# Patient Record
Sex: Male | Born: 1965 | Race: White | Hispanic: No | Marital: Married | Smoking: Never smoker
Health system: Southern US, Community
[De-identification: ages and names within clinical notes are randomized; demographics above are authoritative.]

## PROBLEM LIST (undated history)

## (undated) DIAGNOSIS — K219 Gastro-esophageal reflux disease without esophagitis: Secondary | ICD-10-CM

## (undated) DIAGNOSIS — I10 Essential (primary) hypertension: Secondary | ICD-10-CM

## (undated) DIAGNOSIS — R569 Unspecified convulsions: Secondary | ICD-10-CM

## (undated) DIAGNOSIS — F419 Anxiety disorder, unspecified: Secondary | ICD-10-CM

## (undated) DIAGNOSIS — F445 Conversion disorder with seizures or convulsions: Secondary | ICD-10-CM

## (undated) HISTORY — PX: HERNIA REPAIR: SHX51

## (undated) HISTORY — PX: NASAL SEPTUM SURGERY: SHX37

## (undated) HISTORY — PX: NASAL SINUS SURGERY: SHX719

## (undated) HISTORY — DX: Essential (primary) hypertension: I10

## (undated) HISTORY — PX: MEDIASTINOSCOPY: SUR861

## (undated) HISTORY — PX: BRONCHOSCOPY: SUR163

## (undated) HISTORY — PX: CLEFT LIP REPAIR: SUR1164

## (undated) HISTORY — PX: INGUINAL HERNIA REPAIR: SUR1180

## (undated) HISTORY — PX: PLANTAR FASCIA SURGERY: SHX746

## (undated) HISTORY — PX: COLONOSCOPY: SHX174

---

## 1997-12-09 ENCOUNTER — Encounter: Admission: RE | Admit: 1997-12-09 | Discharge: 1998-03-09 | Payer: Self-pay | Admitting: *Deleted

## 1997-12-30 ENCOUNTER — Encounter: Payer: Self-pay | Admitting: Family Medicine

## 1997-12-30 ENCOUNTER — Ambulatory Visit (HOSPITAL_COMMUNITY): Admission: RE | Admit: 1997-12-30 | Discharge: 1997-12-30 | Payer: Self-pay | Admitting: Family Medicine

## 2013-04-02 DIAGNOSIS — R0600 Dyspnea, unspecified: Secondary | ICD-10-CM | POA: Insufficient documentation

## 2015-07-29 DIAGNOSIS — H6993 Unspecified Eustachian tube disorder, bilateral: Secondary | ICD-10-CM | POA: Insufficient documentation

## 2015-07-29 DIAGNOSIS — Z9622 Myringotomy tube(s) status: Secondary | ICD-10-CM | POA: Insufficient documentation

## 2015-07-29 DIAGNOSIS — H6983 Other specified disorders of Eustachian tube, bilateral: Secondary | ICD-10-CM | POA: Insufficient documentation

## 2016-03-08 DIAGNOSIS — J0101 Acute recurrent maxillary sinusitis: Secondary | ICD-10-CM | POA: Diagnosis not present

## 2016-03-09 DIAGNOSIS — R05 Cough: Secondary | ICD-10-CM | POA: Diagnosis not present

## 2016-03-09 DIAGNOSIS — J209 Acute bronchitis, unspecified: Secondary | ICD-10-CM | POA: Diagnosis not present

## 2016-03-09 DIAGNOSIS — J0101 Acute recurrent maxillary sinusitis: Secondary | ICD-10-CM | POA: Diagnosis not present

## 2016-06-01 DIAGNOSIS — F5101 Primary insomnia: Secondary | ICD-10-CM | POA: Diagnosis not present

## 2016-07-22 DIAGNOSIS — H9192 Unspecified hearing loss, left ear: Secondary | ICD-10-CM | POA: Diagnosis not present

## 2016-07-22 DIAGNOSIS — H9042 Sensorineural hearing loss, unilateral, left ear, with unrestricted hearing on the contralateral side: Secondary | ICD-10-CM | POA: Insufficient documentation

## 2016-07-22 DIAGNOSIS — H6983 Other specified disorders of Eustachian tube, bilateral: Secondary | ICD-10-CM | POA: Diagnosis not present

## 2016-07-22 DIAGNOSIS — Z9622 Myringotomy tube(s) status: Secondary | ICD-10-CM | POA: Diagnosis not present

## 2016-11-19 DIAGNOSIS — B372 Candidiasis of skin and nail: Secondary | ICD-10-CM | POA: Diagnosis not present

## 2017-04-06 DIAGNOSIS — H66002 Acute suppurative otitis media without spontaneous rupture of ear drum, left ear: Secondary | ICD-10-CM | POA: Diagnosis not present

## 2017-04-06 DIAGNOSIS — J0110 Acute frontal sinusitis, unspecified: Secondary | ICD-10-CM | POA: Diagnosis not present

## 2017-05-13 DIAGNOSIS — M546 Pain in thoracic spine: Secondary | ICD-10-CM | POA: Diagnosis not present

## 2017-05-13 DIAGNOSIS — S338XXA Sprain of other parts of lumbar spine and pelvis, initial encounter: Secondary | ICD-10-CM | POA: Diagnosis not present

## 2017-05-17 DIAGNOSIS — S338XXA Sprain of other parts of lumbar spine and pelvis, initial encounter: Secondary | ICD-10-CM | POA: Diagnosis not present

## 2017-05-17 DIAGNOSIS — M546 Pain in thoracic spine: Secondary | ICD-10-CM | POA: Diagnosis not present

## 2017-05-18 DIAGNOSIS — S338XXA Sprain of other parts of lumbar spine and pelvis, initial encounter: Secondary | ICD-10-CM | POA: Diagnosis not present

## 2017-05-18 DIAGNOSIS — M546 Pain in thoracic spine: Secondary | ICD-10-CM | POA: Diagnosis not present

## 2017-05-23 DIAGNOSIS — M546 Pain in thoracic spine: Secondary | ICD-10-CM | POA: Diagnosis not present

## 2017-05-23 DIAGNOSIS — S338XXA Sprain of other parts of lumbar spine and pelvis, initial encounter: Secondary | ICD-10-CM | POA: Diagnosis not present

## 2017-05-30 DIAGNOSIS — S338XXA Sprain of other parts of lumbar spine and pelvis, initial encounter: Secondary | ICD-10-CM | POA: Diagnosis not present

## 2017-05-30 DIAGNOSIS — M546 Pain in thoracic spine: Secondary | ICD-10-CM | POA: Diagnosis not present

## 2017-06-14 DIAGNOSIS — Z6832 Body mass index (BMI) 32.0-32.9, adult: Secondary | ICD-10-CM | POA: Diagnosis not present

## 2017-06-14 DIAGNOSIS — F5101 Primary insomnia: Secondary | ICD-10-CM | POA: Diagnosis not present

## 2017-08-22 DIAGNOSIS — J019 Acute sinusitis, unspecified: Secondary | ICD-10-CM | POA: Diagnosis not present

## 2017-08-22 DIAGNOSIS — H608X1 Other otitis externa, right ear: Secondary | ICD-10-CM | POA: Diagnosis not present

## 2017-08-22 DIAGNOSIS — H6691 Otitis media, unspecified, right ear: Secondary | ICD-10-CM | POA: Diagnosis not present

## 2017-09-07 DIAGNOSIS — H6983 Other specified disorders of Eustachian tube, bilateral: Secondary | ICD-10-CM | POA: Diagnosis not present

## 2017-09-07 DIAGNOSIS — H66002 Acute suppurative otitis media without spontaneous rupture of ear drum, left ear: Secondary | ICD-10-CM | POA: Insufficient documentation

## 2017-12-15 DIAGNOSIS — Z0000 Encounter for general adult medical examination without abnormal findings: Secondary | ICD-10-CM | POA: Diagnosis not present

## 2017-12-22 DIAGNOSIS — Z0001 Encounter for general adult medical examination with abnormal findings: Secondary | ICD-10-CM | POA: Diagnosis not present

## 2017-12-22 DIAGNOSIS — Z6831 Body mass index (BMI) 31.0-31.9, adult: Secondary | ICD-10-CM | POA: Diagnosis not present

## 2018-01-18 DIAGNOSIS — Z1211 Encounter for screening for malignant neoplasm of colon: Secondary | ICD-10-CM | POA: Insufficient documentation

## 2018-01-23 DIAGNOSIS — Z6830 Body mass index (BMI) 30.0-30.9, adult: Secondary | ICD-10-CM | POA: Diagnosis not present

## 2018-01-23 DIAGNOSIS — J019 Acute sinusitis, unspecified: Secondary | ICD-10-CM | POA: Diagnosis not present

## 2018-02-14 ENCOUNTER — Encounter (HOSPITAL_COMMUNITY): Payer: Self-pay

## 2018-02-14 ENCOUNTER — Other Ambulatory Visit: Payer: Self-pay

## 2018-02-14 ENCOUNTER — Emergency Department (HOSPITAL_COMMUNITY)
Admission: EM | Admit: 2018-02-14 | Discharge: 2018-02-14 | Disposition: A | Discharge status: Home / Self Care | Payer: 59 | Attending: Emergency Medicine | Admitting: Emergency Medicine

## 2018-02-14 ENCOUNTER — Emergency Department (HOSPITAL_COMMUNITY): Payer: 59

## 2018-02-14 DIAGNOSIS — Z79899 Other long term (current) drug therapy: Secondary | ICD-10-CM | POA: Insufficient documentation

## 2018-02-14 DIAGNOSIS — R079 Chest pain, unspecified: Secondary | ICD-10-CM | POA: Diagnosis not present

## 2018-02-14 DIAGNOSIS — R0789 Other chest pain: Secondary | ICD-10-CM | POA: Diagnosis not present

## 2018-02-14 DIAGNOSIS — K760 Fatty (change of) liver, not elsewhere classified: Secondary | ICD-10-CM | POA: Diagnosis not present

## 2018-02-14 LAB — BASIC METABOLIC PANEL
Anion gap: 11 (ref 5–15)
BUN: 20 mg/dL (ref 6–20)
CO2: 26 mmol/L (ref 22–32)
Calcium: 10 mg/dL (ref 8.9–10.3)
Chloride: 101 mmol/L (ref 98–111)
Creatinine, Ser: 0.94 mg/dL (ref 0.61–1.24)
GFR calc Af Amer: >60 mL/min (ref >60)
GFR calc non Af Amer: >60 mL/min (ref >60)
Glucose, Bld: 114 mg/dL — ABNORMAL HIGH (ref 70–99)
Potassium: 4 mmol/L (ref 3.5–5.1)
Sodium: 138 mmol/L (ref 135–145)

## 2018-02-14 LAB — HEPATIC FUNCTION PANEL
ALT: 23 U/L (ref 0–44)
AST: 20 U/L (ref 15–41)
Albumin: 4.6 g/dL (ref 3.5–5.0)
Alkaline Phosphatase: 80 U/L (ref 38–126)
Bilirubin, Direct: 0.1 mg/dL (ref 0.0–0.2)
Indirect Bilirubin: 0.9 mg/dL (ref 0.3–0.9)
Total Bilirubin: 1 mg/dL (ref 0.3–1.2)
Total Protein: 7.6 g/dL (ref 6.5–8.1)

## 2018-02-14 LAB — CBC
HCT: 47.9 % (ref 39.0–52.0)
Hemoglobin: 15.8 g/dL (ref 13.0–17.0)
MCH: 29.4 pg (ref 26.0–34.0)
MCHC: 33 g/dL (ref 30.0–36.0)
MCV: 89.2 fL (ref 80.0–100.0)
Platelets: 321 10*3/uL (ref 150–400)
RBC: 5.37 MIL/uL (ref 4.22–5.81)
RDW: 12.6 % (ref 11.5–15.5)
WBC: 12 10*3/uL — ABNORMAL HIGH (ref 4.0–10.5)
nRBC: 0 % (ref 0.0–0.2)

## 2018-02-14 LAB — I-STAT TROPONIN, ED: Troponin i, poc: 0.01 ng/mL (ref 0.00–0.08)

## 2018-02-14 LAB — LIPASE, BLOOD: Lipase: 22 U/L (ref 11–51)

## 2018-02-14 MED ORDER — OMEPRAZOLE 20 MG PO CPDR: 20.0000 mg | DELAYED_RELEASE_CAPSULE | Freq: Two times a day (BID) | ORAL | 0 refills | Status: DC

## 2018-02-14 MED ORDER — LIDOCAINE VISCOUS HCL 2 % MT SOLN
15.0000 mL | Freq: Once | OROMUCOSAL | Status: AC
  Administered 2018-02-14: 15 mL via ORAL
  Filled 2018-02-14: qty 15

## 2018-02-14 MED ORDER — SODIUM CHLORIDE 0.9 % IV SOLN
INTRAVENOUS | Status: DC
  Administered 2018-02-14: 14:00:00 via INTRAVENOUS

## 2018-02-14 MED ORDER — PROMETHAZINE HCL 25 MG/ML IJ SOLN
12.5000 mg | Freq: Once | INTRAMUSCULAR | Status: AC
  Administered 2018-02-14: 12.5 mg via INTRAVENOUS
  Filled 2018-02-14: qty 1

## 2018-02-14 MED ORDER — FAMOTIDINE IN NACL 20-0.9 MG/50ML-% IV SOLN
20.0000 mg | Freq: Once | INTRAVENOUS | Status: AC
  Administered 2018-02-14: 20 mg via INTRAVENOUS
  Filled 2018-02-14: qty 50

## 2018-02-14 MED ORDER — ALUM & MAG HYDROXIDE-SIMETH 200-200-20 MG/5ML PO SUSP
30.0000 mL | Freq: Once | ORAL | Status: AC
  Administered 2018-02-14: 30 mL via ORAL
  Filled 2018-02-14: qty 30

## 2018-02-14 MED ORDER — HYDROMORPHONE HCL 1 MG/ML IJ SOLN
1.0000 mg | Freq: Once | INTRAMUSCULAR | Status: AC
  Administered 2018-02-14: 1 mg via INTRAVENOUS
  Filled 2018-02-14: qty 1

## 2018-02-14 NOTE — ED Provider Notes (Signed)
[facility] EMERGENCY DEPARTMENT Provider Note   CSN: [id number] Arrival date & time: 02/14/18  1248     History   Chief Complaint Chief Complaint  Patient presents with  . Chest Pain    HPI Mathew Moore is a 53 y.o. male.  HPI   53 year old male with chest pain.  Onset last night while watching the football game.  Describes pressure in the mid to her sternal region.  Pretty constant since yesterday.  No appreciable exacerbating relieving factors.  Also occasional some sharper pain in the right axillary region.  Felt a little bit nauseated and mildly short of breath when he took a hot shower today.  Has tried taking Tums yesterday and Prilosec this morning without improvement.  No fevers or chills.  Occasional nonproductive cough.  No unusual leg pain or swelling.  Denies regular NSAID usage.  History reviewed. No pertinent past medical history.  There are no active problems to display for this patient.   Past Surgical History:  Procedure Laterality Date  . HERNIA REPAIR          Home Medications    Prior to Admission medications   Medication Sig Start Date End Date Taking? Authorizing Provider  acetaminophen (TYLENOL) 500 MG tablet Take 1 tablet by mouth every 6 (six) hours as needed.   Yes [provider]  calcium carbonate (TUMS - DOSED IN MG ELEMENTAL CALCIUM) 500 MG chewable tablet Chew 2 tablets by mouth every 4 (four) hours as needed for indigestion or heartburn.   Yes [provider]  fluticasone (FLONASE) 50 MCG/ACT nasal spray Place 1 spray into both nostrils daily as needed.   Yes [provider]  Melatonin 5 MG TABS Take 1 tablet by mouth at bedtime.   Yes [provider]  omeprazole (PRILOSEC) 20 MG capsule Take 20 mg by mouth daily.   Yes [provider]  zolpidem (AMBIEN) 10 MG tablet Take 0.5-1 tablets by mouth at bedtime as needed. 01/24/18  Yes [provider]  nitroGLYCERIN (NITROSTAT) 0.4 MG SL  tablet Place 0.4 mg under the tongue every 5 (five) minutes as needed for chest pain.    [provider]    Family History No family history on file.  Social History Social History   Tobacco Use  . Smoking status: Never Smoker  . Smokeless tobacco: Never Used  Substance Use Topics  . Alcohol use: Not on file  . Drug use: Not on file     Allergies   Patient has no known allergies.   Review of Systems Review of Systems  All systems reviewed and negative, other than as noted in HPI.  Physical Exam Updated Vital Signs BP (!) 141/77 (BP Location: Right Arm)   Pulse 93   Resp 12   Ht 5\' 7"  (1.702 m)   Wt 93 kg   SpO2 100%   BMI 32.11 kg/m   Physical Exam Vitals signs and nursing note reviewed.  Constitutional:      General: He is not in acute distress.    Appearance: He is well-developed.  HENT:     Head: Normocephalic and atraumatic.  Eyes:     General:        Right eye: No discharge.        Left eye: No discharge.     Conjunctiva/sclera: Conjunctivae normal.  Neck:     Musculoskeletal: Neck supple.  Cardiovascular:     Rate and Rhythm: Normal rate and regular rhythm.  Heart sounds: Normal heart sounds. No murmur. No friction rub. No gallop.   Pulmonary:     Effort: Pulmonary effort is normal. No respiratory distress.     Breath sounds: Normal breath sounds.  Chest:     Chest wall: No tenderness.  Abdominal:     General: There is no distension.     Palpations: Abdomen is soft.     Tenderness: There is abdominal tenderness.     Comments: Epigastric tenderness without rebound or guarding.  No distention.  Musculoskeletal:        General: No tenderness.     Comments: Lower extremities symmetric as compared to each other. No calf tenderness. Negative Homan's. No palpable cords.   Skin:    General: Skin is warm and dry.  Neurological:     Mental Status: He is alert.  Psychiatric:        Behavior: Behavior normal.        Thought Content:  Thought content normal.      ED Treatments / Results  Labs (all labs ordered are listed, but only abnormal results are displayed) Labs Reviewed  BASIC METABOLIC PANEL - Abnormal; Notable for the following components:      Result Value   Glucose, Bld 114 (*)    All other components within normal limits  CBC - Abnormal; Notable for the following components:   WBC 12.0 (*)    All other components within normal limits  HEPATIC FUNCTION PANEL  LIPASE, BLOOD  I-STAT TROPONIN, ED    EKG EKG Interpretation  Date/Time:  Tuesday February 14 2018 13:03:19 EST Ventricular Rate:  93 PR Interval:  146 QRS Duration: 74 QT Interval:  336 QTC Calculation: 417 R Axis:   -46 Text Interpretation:  Normal sinus rhythm with sinus arrhythmia Left axis deviation Inferior infarct , age undetermined Abnormal ECG No old tracing to compare Confirmed by Virgel Manifold [phone]) on 02/14/2018 1:24:21 PM   Radiology Dg Chest 2 View  Result Date: 02/14/2018 CLINICAL DATA:  Chest tightness since last night. EXAM: CHEST - 2 VIEW COMPARISON:  Report dated 04/08/2015. FINDINGS: The heart size and mediastinal contours are within normal limits. Both lungs are clear. The visualized skeletal structures are unremarkable. IMPRESSION: Normal examination. Electronically Signed   By: Claudie Revering M.D.   On: 02/14/2018 13:57   US Abdomen Limited  Result Date: 02/14/2018 CLINICAL DATA:  Nausea and chest tightness since last night. EXAM: ULTRASOUND ABDOMEN LIMITED RIGHT UPPER QUADRANT COMPARISON:  None. FINDINGS: Gallbladder: No gallstones or wall thickening visualized. No sonographic Murphy sign noted by sonographer. Common bile duct: Diameter: 0.4 cm Liver: No focal lesion. Echogenicity is increased. Portal vein is patent on color Doppler imaging with normal direction of blood flow towards the liver. IMPRESSION: Negative for gallstones.  No acute abnormality. Fatty infiltration of the liver. Electronically Signed   By:  Inge Rise M.D.   On: 02/14/2018 14:44    Procedures Procedures (including critical care time)  Medications Ordered in ED Medications - No data to display   Initial Impression / Assessment and Plan / ED Course  I have reviewed the triage vital signs and the nursing notes.  Pertinent labs & imaging results that were available during my care of the patient were reviewed by me and considered in my medical decision making (see chart for details).     52 year old male with chest pain.  Doubt ACS.  His EKG does show Q waves inferiorly, but there is no old EKG for  comparison purposes.  With the symptoms starting last night and fairly constant I would expect to see an elevation in his troponin by now.  Doubt PE, dissection or other emergent process.  ED work-up fairly unremarkable.  Ultrasound of the right upper quadrant without cholelithiasis or other concerning pathology.  May potentially PUD or gastritis.  We will put him presumptively on a PPI.  It has been determined that no acute conditions requiring further emergency intervention are present at this time. The patient has been advised of the diagnosis and plan. I reviewed any labs and imaging including any potential incidental findings. I have reviewed nursing notes and appropriate previous records. We have discussed signs and symptoms that warrant return to the ED and they are listed in the discharge instructions.      Final Clinical Impressions(s) / ED Diagnoses   Final diagnoses:  Chest pain, unspecified type    ED Discharge Orders    None       Virgel Manifold, MD 02/16/18 [phone]

## 2018-02-14 NOTE — ED Triage Notes (Signed)
Pt is having chest pain that started last night. Pt has had diarrhea as well and is now nauseous. Chest pain is central and describes it as pressure. No history of cardiac issues.

## 2018-02-23 DIAGNOSIS — Z1211 Encounter for screening for malignant neoplasm of colon: Secondary | ICD-10-CM | POA: Diagnosis not present

## 2018-02-23 DIAGNOSIS — K219 Gastro-esophageal reflux disease without esophagitis: Secondary | ICD-10-CM | POA: Diagnosis not present

## 2018-04-07 DIAGNOSIS — J0101 Acute recurrent maxillary sinusitis: Secondary | ICD-10-CM | POA: Diagnosis not present

## 2018-04-07 DIAGNOSIS — H6691 Otitis media, unspecified, right ear: Secondary | ICD-10-CM | POA: Diagnosis not present

## 2018-05-01 DIAGNOSIS — M722 Plantar fascial fibromatosis: Secondary | ICD-10-CM | POA: Diagnosis not present

## 2018-05-01 DIAGNOSIS — M7731 Calcaneal spur, right foot: Secondary | ICD-10-CM | POA: Diagnosis not present

## 2018-05-09 DIAGNOSIS — M722 Plantar fascial fibromatosis: Secondary | ICD-10-CM | POA: Diagnosis not present

## 2018-06-01 DIAGNOSIS — M722 Plantar fascial fibromatosis: Secondary | ICD-10-CM | POA: Diagnosis not present

## 2018-07-25 ENCOUNTER — Emergency Department (HOSPITAL_COMMUNITY): Payer: 59

## 2018-07-25 ENCOUNTER — Emergency Department (HOSPITAL_COMMUNITY)
Admission: EM | Admit: 2018-07-25 | Discharge: 2018-07-25 | Disposition: A | Discharge status: Home / Self Care | Payer: 59 | Attending: Emergency Medicine | Admitting: Emergency Medicine

## 2018-07-25 ENCOUNTER — Other Ambulatory Visit: Payer: Self-pay

## 2018-07-25 ENCOUNTER — Encounter (HOSPITAL_COMMUNITY): Payer: Self-pay | Admitting: Emergency Medicine

## 2018-07-25 DIAGNOSIS — Z79899 Other long term (current) drug therapy: Secondary | ICD-10-CM | POA: Diagnosis not present

## 2018-07-25 DIAGNOSIS — R0789 Other chest pain: Secondary | ICD-10-CM | POA: Insufficient documentation

## 2018-07-25 DIAGNOSIS — R079 Chest pain, unspecified: Secondary | ICD-10-CM | POA: Diagnosis present

## 2018-07-25 LAB — DIFFERENTIAL
Abs Immature Granulocytes: 0.01 10*3/uL (ref 0.00–0.07)
Basophils Absolute: 0.1 10*3/uL (ref 0.0–0.1)
Basophils Relative: 1 %
Eosinophils Absolute: 0.1 10*3/uL (ref 0.0–0.5)
Eosinophils Relative: 2 %
Immature Granulocytes: 0 %
Lymphocytes Relative: 48 %
Lymphs Abs: 2.5 10*3/uL (ref 0.7–4.0)
Monocytes Absolute: 0.3 10*3/uL (ref 0.1–1.0)
Monocytes Relative: 6 %
Neutro Abs: 2.3 10*3/uL (ref 1.7–7.7)
Neutrophils Relative %: 43 %

## 2018-07-25 LAB — BASIC METABOLIC PANEL
Anion gap: 10 (ref 5–15)
BUN: 19 mg/dL (ref 6–20)
CO2: 23 mmol/L (ref 22–32)
Calcium: 9 mg/dL (ref 8.9–10.3)
Chloride: 109 mmol/L (ref 98–111)
Creatinine, Ser: 1.05 mg/dL (ref 0.61–1.24)
GFR calc Af Amer: >60 mL/min (ref >60)
GFR calc non Af Amer: >60 mL/min (ref >60)
Glucose, Bld: 103 mg/dL — ABNORMAL HIGH (ref 70–99)
Potassium: 3.7 mmol/L (ref 3.5–5.1)
Sodium: 142 mmol/L (ref 135–145)

## 2018-07-25 LAB — CBC
HCT: 42.8 % (ref 39.0–52.0)
Hemoglobin: 13.9 g/dL (ref 13.0–17.0)
MCH: 29.1 pg (ref 26.0–34.0)
MCHC: 32.5 g/dL (ref 30.0–36.0)
MCV: 89.5 fL (ref 80.0–100.0)
Platelets: 309 10*3/uL (ref 150–400)
RBC: 4.78 MIL/uL (ref 4.22–5.81)
RDW: 13.1 % (ref 11.5–15.5)
WBC: 5.3 10*3/uL (ref 4.0–10.5)
nRBC: 0 % (ref 0.0–0.2)

## 2018-07-25 LAB — HEPATIC FUNCTION PANEL
ALT: 25 U/L (ref 0–44)
AST: 20 U/L (ref 15–41)
Albumin: 4 g/dL (ref 3.5–5.0)
Alkaline Phosphatase: 61 U/L (ref 38–126)
Bilirubin, Direct: 0.1 mg/dL (ref 0.0–0.2)
Indirect Bilirubin: 0.3 mg/dL (ref 0.3–0.9)
Total Bilirubin: 0.4 mg/dL (ref 0.3–1.2)
Total Protein: 6.3 g/dL — ABNORMAL LOW (ref 6.5–8.1)

## 2018-07-25 LAB — TROPONIN I (HIGH SENSITIVITY)
Troponin I (High Sensitivity): 2 ng/L (ref <18)
Troponin I (High Sensitivity): 3 ng/L (ref <18)

## 2018-07-25 MED ORDER — MORPHINE SULFATE (PF) 4 MG/ML IV SOLN
4.0000 mg | Freq: Once | INTRAVENOUS | Status: AC
  Administered 2018-07-25: 4 mg via INTRAVENOUS
  Filled 2018-07-25: qty 1

## 2018-07-25 MED ORDER — NITROGLYCERIN 0.4 MG SL SUBL
0.4000 mg | SUBLINGUAL_TABLET | SUBLINGUAL | Status: DC | PRN
  Administered 2018-07-25 (×2): 0.4 mg via SUBLINGUAL
  Filled 2018-07-25: qty 1

## 2018-07-25 MED ORDER — LORAZEPAM 1 MG PO TABS
1.0000 mg | ORAL_TABLET | Freq: Once | ORAL | Status: AC
  Administered 2018-07-25: 1 mg via ORAL
  Filled 2018-07-25: qty 1

## 2018-07-25 MED ORDER — SODIUM CHLORIDE 0.9% FLUSH
3.0000 mL | Freq: Once | INTRAVENOUS | Status: AC
  Administered 2018-07-25: 3 mL via INTRAVENOUS

## 2018-07-25 NOTE — ED Provider Notes (Signed)
[facility] EMERGENCY DEPARTMENT Provider Note   CSN: [id number] Arrival date & time: 07/25/18  1905     History   Chief Complaint Chief Complaint  Patient presents with  . Chest Pain    HPI DREVIN ORTNER is a 53 y.o. male.     Patient had palpitations and chest discomfort today.  No sweating no shortness of breath  The history is provided by the patient. No language interpreter was used.  Chest Pain Pain location:  Substernal area Pain quality: aching   Pain radiates to:  Does not radiate Pain severity:  Mild Onset quality:  Sudden Timing:  Intermittent Progression:  Waxing and waning Chronicity:  New Context: not breathing   Relieved by:  Nothing Worsened by:  Nothing Associated symptoms: palpitations   Associated symptoms: no abdominal pain, no back pain, no cough, no fatigue and no headache     History reviewed. No pertinent past medical history.  There are no active problems to display for this patient.   Past Surgical History:  Procedure Laterality Date  . HERNIA REPAIR          Home Medications    Prior to Admission medications   Medication Sig Start Date End Date Taking? Authorizing Provider  acetaminophen (TYLENOL) 500 MG tablet Take 1 tablet by mouth every 6 (six) hours as needed.    [provider]  calcium carbonate (TUMS - DOSED IN MG ELEMENTAL CALCIUM) 500 MG chewable tablet Chew 2 tablets by mouth every 4 (four) hours as needed for indigestion or heartburn.    [provider]  fluticasone (FLONASE) 50 MCG/ACT nasal spray Place 1 spray into both nostrils daily as needed.    [provider]  Melatonin 5 MG TABS Take 1 tablet by mouth at bedtime.    [provider]  nitroGLYCERIN (NITROSTAT) 0.4 MG SL tablet Place 0.4 mg under the tongue every 5 (five) minutes as needed for chest pain.    [provider]  omeprazole (PRILOSEC) 20 MG capsule Take 20 mg by mouth daily.    [provider]   omeprazole (PRILOSEC) 20 MG capsule Take 1 capsule (20 mg total) by mouth 2 (two) times daily before a meal. 02/14/18   Virgel Manifold, MD  zolpidem (AMBIEN) 10 MG tablet Take 0.5-1 tablets by mouth at bedtime as needed. 01/24/18   [provider]    Family History History reviewed. No pertinent family history.  Social History Social History   Tobacco Use  . Smoking status: Never Smoker  . Smokeless tobacco: Never Used  Substance Use Topics  . Alcohol use: Not Currently  . Drug use: Not Currently     Allergies   Patient has no known allergies.   Review of Systems Review of Systems  Constitutional: Negative for appetite change and fatigue.  HENT: Negative for congestion, ear discharge and sinus pressure.   Eyes: Negative for discharge.  Respiratory: Negative for cough.   Cardiovascular: Positive for chest pain and palpitations.  Gastrointestinal: Negative for abdominal pain and diarrhea.  Genitourinary: Negative for frequency and hematuria.  Musculoskeletal: Negative for back pain.  Skin: Negative for rash.  Neurological: Negative for seizures and headaches.  Psychiatric/Behavioral: Negative for hallucinations.     Physical Exam Updated Vital Signs BP 124/86   Pulse 76   Resp (!) 29   Ht 5\' 7"  (1.702 m)   Wt 92.1 kg   SpO2 96%   BMI 31.79 kg/m   Physical Exam Vitals signs and  nursing note reviewed.  Constitutional:      Appearance: He is well-developed.  HENT:     Head: Normocephalic.     Nose: Nose normal.  Eyes:     General: No scleral icterus.    Conjunctiva/sclera: Conjunctivae normal.  Neck:     Musculoskeletal: Neck supple.     Thyroid: No thyromegaly.  Cardiovascular:     Rate and Rhythm: Normal rate and regular rhythm.     Heart sounds: No murmur. No friction rub. No gallop.   Pulmonary:     Breath sounds: No stridor. No wheezing or rales.  Chest:     Chest wall: No tenderness.  Abdominal:     General: There is no distension.      Tenderness: There is no abdominal tenderness. There is no rebound.  Musculoskeletal: Normal range of motion.  Lymphadenopathy:     Cervical: No cervical adenopathy.  Skin:    Findings: No erythema or rash.  Neurological:     Mental Status: He is oriented to person, place, and time.     Motor: No abnormal muscle tone.     Coordination: Coordination normal.  Psychiatric:        Behavior: Behavior normal.      ED Treatments / Results  Labs (all labs ordered are listed, but only abnormal results are displayed) Labs Reviewed  BASIC METABOLIC PANEL - Abnormal; Notable for the following components:      Result Value   Glucose, Bld 103 (*)    All other components within normal limits  HEPATIC FUNCTION PANEL - Abnormal; Notable for the following components:   Total Protein 6.3 (*)    All other components within normal limits  CBC  TROPONIN I (HIGH SENSITIVITY)  TROPONIN I (HIGH SENSITIVITY)  DIFFERENTIAL    EKG EKG Interpretation  Date/Time:  Tuesday July 25 2018 19:13:59 EDT Ventricular Rate:  80 PR Interval:    QRS Duration: 84 QT Interval:  355 QTC Calculation: 410 R Axis:   -30 Text Interpretation:  Sinus rhythm Left axis deviation Low voltage, precordial leads Abnormal R-wave progression, early transition Confirmed by Milton Ferguson [phone]) on 07/25/2018 7:38:12 PM Also confirmed by Milton Ferguson [phone])  on 07/25/2018 10:26:24 PM   Radiology Dg Chest 2 View  Result Date: 07/25/2018 CLINICAL DATA:  Chest pain and shortness of breath today. EXAM: CHEST - 2 VIEW COMPARISON:  02/14/2018 FINDINGS: The cardiac silhouette, mediastinal and hilar contours are within normal limits and stable. The lungs are clear. No pleural effusion. No worrisome pulmonary lesions. IMPRESSION: No acute cardiopulmonary findings. Electronically Signed   By: Marijo Sanes M.D.   On: 07/25/2018 21:15    Procedures Procedures (including critical care time)  Medications Ordered in ED Medications   nitroGLYCERIN (NITROSTAT) SL tablet 0.4 mg (0.4 mg Sublingual Given 07/25/18 2037)  LORazepam (ATIVAN) tablet 1 mg (has no administration in time range)  sodium chloride flush (NS) 0.9 % injection 3 mL (3 mLs Intravenous Given 07/25/18 1937)  morphine 4 MG/ML injection 4 mg (4 mg Intravenous Given 07/25/18 2051)     Initial Impression / Assessment and Plan / ED Course  I have reviewed the triage vital signs and the nursing notes.  Pertinent labs & imaging results that were available during my care of the patient were reviewed by me and considered in my medical decision making (see chart for details).        Troponin x2 unremarkable.  EKG and chest x-ray unremarkable.  Patient improved somewhat  treatment.  Doubt palpitations and chest discomfort is coronary artery related.  Patient will be referred to cardiology for possible continued evaluation  Final Clinical Impressions(s) / ED Diagnoses   Final diagnoses:  Atypical chest pain    ED Discharge Orders    None       Milton Ferguson, MD 07/25/18 2238

## 2018-07-25 NOTE — ED Notes (Signed)
Pt in x-ray at this time

## 2018-07-25 NOTE — ED Triage Notes (Signed)
Pt here by [facility] c/o center chest pain since last night, constant since 1200 today, v/s WNL upon arrival by EMS

## 2018-07-25 NOTE — Discharge Instructions (Addendum)
Follow-up with Dr. Bronson Ing next week.  Call make an appointment.  Return if any problems

## 2018-08-13 NOTE — H&P (View-Only) (Signed)
Cardiology Consultation:   Patient ID: Mathew Moore MRN: [id number]; DOB: [birth year]  Admit date: (Not on file) Date of Consult: 08/15/2018  Primary Care Provider: Manon Hilding, MD Primary Cardiologist: New Primary Electrophysiologist:  None     Patient Profile:   Mathew Moore is a 53 y.o. male with a hx of GERD who is being seen today for the evaluation of chest pain  at the request of Dr Roderic Palau [facility].   History of Present Illness:   Mathew Moore 53 y.o. non smoker with no history of CAD. Seen in [facility] 07/25/18 with atypical chest pain. R/O no acute ECG changes and normal CXR. 24 hours palpitations and substernal pain in chest. Aching non radiating intermittent Not pleuritic Nothing improved including nitro. Associated with palpitations. Telemetry in ER no arrhythmia ECG 07/26/18 SR rate 80 normal LAD. Labs including troponin x 2 unremarkable Also seen 02/14/18 with same. Occurred watching football game some sharp pain in left axilla. 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 No pertinent past medical history.  Past Surgical History:  Procedure Laterality Date  . HERNIA REPAIR       Home Medications:  Prior to Admission medications   Medication Sig Start Date End Date Taking? Authorizing Provider  acetaminophen (TYLENOL) 500 MG tablet Take 1 tablet by mouth every 6 (six) hours as needed.    [provider]  calcium carbonate (TUMS - DOSED IN MG ELEMENTAL CALCIUM) 500 MG chewable tablet Chew 2 tablets by mouth every 4 (four) hours as needed for indigestion or heartburn.    [provider]  fluticasone (FLONASE) 50 MCG/ACT nasal spray Place 1 spray into both nostrils  daily as needed.    [provider]  Melatonin 5 MG TABS Take 1 tablet by mouth at bedtime.    [provider]  nitroGLYCERIN (NITROSTAT) 0.4 MG SL tablet Place 0.4 mg under the tongue every 5 (five) minutes as needed for chest pain.    [provider]  omeprazole (PRILOSEC) 20 MG capsule Take 20 mg by mouth daily.    [provider]  omeprazole (PRILOSEC) 20 MG capsule Take 1 capsule (20 mg total) by mouth 2 (two) times daily before a meal. 02/14/18   Virgel Manifold, MD  zolpidem (AMBIEN) 10 MG tablet Take 0.5-1 tablets by mouth at bedtime as needed. 01/24/18   [provider]    Inpatient Medications: Scheduled Meds:  Continuous Infusions:  PRN Meds:   Allergies:   No Known Allergies  Social History:   Social History   Socioeconomic History  . Marital status: Married    Spouse name: Not on file  . Number of children: Not on file  . Years of education: Not on file  . Highest education level: Not on file  Occupational History  . Not on file  Social Needs  . Financial resource strain: Not on file  . Food insecurity    Worry: Not on file    Inability: Not on file  . Transportation needs    Medical: Not on file    Non-medical: Not on file  Tobacco Use  .  Smoking status: Never Smoker  . Smokeless tobacco: Never Used  Substance and Sexual Activity  . Alcohol use: Not Currently  . Drug use: Not Currently  . Sexual activity: Not on file  Lifestyle  . Physical activity    Days per week: Not on file    Minutes per session: Not on file  . Stress: Not on file  Relationships  . Social Herbalist on phone: Not on file    Gets together: Not on file    Attends religious service: Not on file    Active member of club or organization: Not on file    Attends meetings of clubs or organizations: Not on file    Relationship status: Not on file  . Intimate partner violence    Fear of current or ex partner: Not on file     Emotionally abused: Not on file    Physically abused: Not on file    Forced sexual activity: Not on file  Other Topics Concern  . Not on file  Social History Narrative  . Not on file    Family History:   No family history on file.   ROS:  Please see the history of present illness.   All other ROS reviewed and negative.     Physical Exam/Data:   Vitals:   08/15/18 1338  BP: 133/79  Pulse: 90  Temp: 99.4 F (37.4 C)  Weight: 203 lb (92.1 kg)  Height: 5\' 7"  (1.702 m)   @IOBRIEF @ Last 3 Weights 08/15/2018 07/25/2018 02/14/2018  Weight (lbs) 203 lb 203 lb 205 lb  Weight (kg) 92.08 kg 92.08 kg 92.987 kg     Body mass index is 31.79 kg/m.  General:  Well nourished, well developed, in no acute distress  HEENT: normal Lymph: no adenopathy Neck: no JVD Endocrine:  No thryomegaly Vascular: No carotid bruits; FA pulses 2+ bilaterally without bruits  Cardiac:  normal S1, S2; RRR; no murmur   Lungs:  clear to auscultation bilaterally, no wheezing, rhonchi or rales  Abd: soft, nontender, no hepatomegaly  Ext: no edema Musculoskeletal:  No deformities, BUE and BLE strength normal and equal Skin: warm and dry  Neuro:  CNs 2-12 intact, no focal abnormalities noted Psych:  Normal affect   EKG:  07/26/18 NSR normal LAD  Telemetry:  Telemetry was personally reviewed and demonstrates:  NSR no arrhythmia in ER   Relevant CV Studies: None   Laboratory Data:  High Sensitivity Troponin:   Recent Labs  Lab 07/25/18 1931 07/25/18 2110  TROPONINIHS 2.00 3.00     Cardiac EnzymesNo results for input(s): TROPONINI in the last 168 hours. No results for input(s): TROPIPOC in the last 168 hours.  ChemistryNo results for input(s): NA, K, CL, CO2, GLUCOSE, BUN, CREATININE, CALCIUM, GFRNONAA, GFRAA, ANIONGAP in the last 168 hours.  No results for input(s): PROT, ALBUMIN, AST, ALT, ALKPHOS, BILITOT in the last 168 hours. HematologyNo results for input(s): WBC, RBC, HGB, HCT, MCV, MCH, MCHC,  RDW, PLT in the last 168 hours. BNPNo results for input(s): BNP, PROBNP in the last 168 hours.  DDimer No results for input(s): DDIMER in the last 168 hours.   Radiology/Studies:  No results found.  Assessment and Plan:   1. Chest Pain:  Atypical f/u stress myovue if normal return to work 08/21/18  2. Palpitations: benign no arrhythmia in ER normal ECG observe  3. GERD:  Low carb diet continue prilosec  4. BP : seems borderline discuss diet and  weight loss will see if he has HTN response to exercise   F/U PRN if stress test normal   For questions or updates, please contact [place] Please consult [email or web address] for contact info under     Signed, Jenkins Rouge, MD  08/15/2018 2:04 PM

## 2018-08-13 NOTE — Progress Notes (Signed)
Cardiology Consultation:   Patient ID: Mathew Moore MRN: [id number]; DOB: 05/30/1965  Admit date: (Not on file) Date of Consult: 08/15/2018  Primary Care Provider: Manon Hilding, MD Primary Cardiologist: New Primary Electrophysiologist:  None     Patient Profile:   Mathew Moore is a 53 y.o. male with a hx of GERD who is being seen today for the evaluation of chest pain  at the request of Dr Mathew [facility].   History of Present Illness:   [name] 53 y.o. non smoker with no history of CAD. Seen in [facility] 07/25/18 with atypical chest pain. R/O no acute ECG changes and normal CXR. 24 hours palpitations and substernal pain in chest. Aching non radiating intermittent Not pleuritic Nothing improved including nitro. Associated with palpitations. Telemetry in ER no arrhythmia ECG 07/26/18 SR rate 80 normal LAD. Labs including troponin x 2 unremarkable Also seen 02/14/18 with same. Occurred watching football game some sharp pain in left axilla. 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 No pertinent past medical history.  Past Surgical History:  Procedure Laterality Date  . HERNIA REPAIR       Home Medications:  Prior to Admission medications   Medication Sig Start Date End Date Taking? Authorizing Provider  acetaminophen (TYLENOL) 500 MG tablet Take 1 tablet by mouth every 6 (six) hours as needed.    [provider]  calcium carbonate (TUMS - DOSED IN MG ELEMENTAL CALCIUM) 500 MG chewable tablet Chew 2 tablets by mouth every 4 (four) hours as needed for indigestion or heartburn.    [provider]  fluticasone (FLONASE) 50 MCG/ACT nasal spray Place 1 spray into both nostrils  daily as needed.    [provider]  Melatonin 5 MG TABS Take 1 tablet by mouth at bedtime.    [provider]  nitroGLYCERIN (NITROSTAT) 0.4 MG SL tablet Place 0.4 mg under the tongue every 5 (five) minutes as needed for chest pain.    [provider]  omeprazole (PRILOSEC) 20 MG capsule Take 20 mg by mouth daily.    [provider]  omeprazole (PRILOSEC) 20 MG capsule Take 1 capsule (20 mg total) by mouth 2 (two) times daily before a meal. 02/14/18   Mathew Manifold, MD  zolpidem (AMBIEN) 10 MG tablet Take 0.5-1 tablets by mouth at bedtime as needed. 01/24/18   [provider]    Inpatient Medications: Scheduled Meds:  Continuous Infusions:  PRN Meds:   Allergies:   No Known Allergies  Social History:   Social History   Socioeconomic History  . Marital status: Married    Spouse name: Not on file  . Number of children: Not on file  . Years of education: Not on file  . Highest education level: Not on file  Occupational History  . Not on file  Social Needs  . Financial resource strain: Not on file  . Food insecurity    Worry: Not on file    Inability: Not on file  . Transportation needs    Medical: Not on file    Non-medical: Not on file  Tobacco Use  .  Smoking status: Never Smoker  . Smokeless tobacco: Never Used  Substance and Sexual Activity  . Alcohol use: Not Currently  . Drug use: Not Currently  . Sexual activity: Not on file  Lifestyle  . Physical activity    Days per week: Not on file    Minutes per session: Not on file  . Stress: Not on file  Relationships  . Social Herbalist on phone: Not on file    Gets together: Not on file    Attends religious service: Not on file    Active member of club or organization: Not on file    Attends meetings of clubs or organizations: Not on file    Relationship status: Not on file  . Intimate partner violence    Fear of current or ex partner: Not on file     Emotionally abused: Not on file    Physically abused: Not on file    Forced sexual activity: Not on file  Other Topics Concern  . Not on file  Social History Narrative  . Not on file    Family History:   No family history on file.   ROS:  Please see the history of present illness.   All other ROS reviewed and negative.     Physical Exam/Data:   Vitals:   08/15/18 1338  BP: 133/79  Pulse: 90  Temp: 99.4 F (37.4 C)  Weight: 203 lb (92.1 kg)  Height: 5\' 7"  (1.702 m)   @IOBRIEF @ Last 3 Weights 08/15/2018 07/25/2018 02/14/2018  Weight (lbs) 203 lb 203 lb 205 lb  Weight (kg) 92.08 kg 92.08 kg 92.987 kg     Body mass index is 31.79 kg/m.  General:  Well nourished, well developed, in no acute distress  HEENT: normal Lymph: no adenopathy Neck: no JVD Endocrine:  No thryomegaly Vascular: No carotid bruits; FA pulses 2+ bilaterally without bruits  Cardiac:  normal S1, S2; RRR; no murmur   Lungs:  clear to auscultation bilaterally, no wheezing, rhonchi or rales  Abd: soft, nontender, no hepatomegaly  Ext: no edema Musculoskeletal:  No deformities, BUE and BLE strength normal and equal Skin: warm and dry  Neuro:  CNs 2-12 intact, no focal abnormalities noted Psych:  Normal affect   EKG:  07/26/18 NSR normal LAD  Telemetry:  Telemetry was personally reviewed and demonstrates:  NSR no arrhythmia in ER   Relevant CV Studies: None   Laboratory Data:  High Sensitivity Troponin:   Recent Labs  Lab 07/25/18 1931 07/25/18 2110  TROPONINIHS 2.00 3.00     Cardiac EnzymesNo results for input(s): TROPONINI in the last 168 hours. No results for input(s): TROPIPOC in the last 168 hours.  ChemistryNo results for input(s): NA, K, CL, CO2, GLUCOSE, BUN, CREATININE, CALCIUM, GFRNONAA, GFRAA, ANIONGAP in the last 168 hours.  No results for input(s): PROT, ALBUMIN, AST, ALT, ALKPHOS, BILITOT in the last 168 hours. HematologyNo results for input(s): WBC, RBC, HGB, HCT, MCV, MCH, MCHC,  RDW, PLT in the last 168 hours. BNPNo results for input(s): BNP, PROBNP in the last 168 hours.  DDimer No results for input(s): DDIMER in the last 168 hours.   Radiology/Studies:  No results found.  Assessment and Plan:   1. Chest Pain:  Atypical f/u stress myovue if normal return to work 08/21/18  2. Palpitations: benign no arrhythmia in ER normal ECG observe  3. GERD:  Low carb diet continue prilosec  4. BP : seems borderline discuss diet and  weight loss will see if he has HTN response to exercise   F/U PRN if stress test normal   For questions or updates, please contact [place] Please consult [email or web address] for contact info under     Signed, Jenkins Rouge, MD  08/15/2018 2:04 PM

## 2018-08-15 ENCOUNTER — Other Ambulatory Visit: Payer: Self-pay

## 2018-08-15 ENCOUNTER — Encounter: Payer: Self-pay | Admitting: Cardiovascular Disease

## 2018-08-15 ENCOUNTER — Ambulatory Visit: Payer: 59 | Admitting: Cardiovascular Disease

## 2018-08-15 DIAGNOSIS — R079 Chest pain, unspecified: Secondary | ICD-10-CM | POA: Diagnosis not present

## 2018-08-15 NOTE — Patient Instructions (Signed)
Medication Instructions:  Your physician recommends that you continue on your current medications as directed. Please refer to the Current Medication list given to you today.   Labwork: none  Testing/Procedures: Your physician has requested that you have en exercise stress myoview. For further information please visit [email or web address]. Please follow instruction sheet, as given.    Follow-Up: Your physician recommends that you schedule a follow-up appointment in: pending test results    Any Other Special Instructions Will Be Listed Below (If Applicable).     If you need a refill on your cardiac medications before your next appointment, please call your pharmacy.

## 2018-08-17 ENCOUNTER — Encounter (HOSPITAL_COMMUNITY)
Admission: RE | Admit: 2018-08-17 | Discharge: 2018-08-17 | Disposition: A | Discharge status: Home / Self Care | Payer: 59 | Source: Ambulatory Visit | Attending: Cardiovascular Disease | Admitting: Cardiovascular Disease

## 2018-08-17 ENCOUNTER — Other Ambulatory Visit: Payer: Self-pay

## 2018-08-17 ENCOUNTER — Telehealth: Payer: Self-pay

## 2018-08-17 ENCOUNTER — Encounter (HOSPITAL_COMMUNITY): Payer: Self-pay

## 2018-08-17 ENCOUNTER — Ambulatory Visit (HOSPITAL_COMMUNITY)
Admission: RE | Admit: 2018-08-17 | Discharge: 2018-08-17 | Disposition: A | Discharge status: Home / Self Care | Payer: 59 | Source: Ambulatory Visit | Attending: Cardiovascular Disease | Admitting: Cardiovascular Disease

## 2018-08-17 DIAGNOSIS — R079 Chest pain, unspecified: Secondary | ICD-10-CM | POA: Diagnosis present

## 2018-08-17 LAB — NM MYOCAR MULTI W/SPECT W/WALL MOTION / EF
LV dias vol: 98 mL (ref 62–150)
LV sys vol: 42 mL
Peak HR: 88 {beats}/min
RATE: 0.35
Rest HR: 60 {beats}/min
SDS: 2
SRS: 2
SSS: 4
TID: 1.13

## 2018-08-17 MED ORDER — TECHNETIUM TC 99M TETROFOSMIN IV KIT
10.0000 | PACK | Freq: Once | INTRAVENOUS | Status: AC | PRN
  Administered 2018-08-17: 10.68 via INTRAVENOUS

## 2018-08-17 MED ORDER — TECHNETIUM TC 99M TETROFOSMIN IV KIT
30.0000 | PACK | Freq: Once | INTRAVENOUS | Status: AC | PRN
  Administered 2018-08-17: 30.21 via INTRAVENOUS

## 2018-08-17 MED ORDER — SODIUM CHLORIDE 0.9% FLUSH
INTRAVENOUS | Status: AC
  Administered 2018-08-17: 10 mL via INTRAVENOUS
  Filled 2018-08-17: qty 10

## 2018-08-17 MED ORDER — REGADENOSON 0.4 MG/5ML IV SOLN
INTRAVENOUS | Status: AC
  Administered 2018-08-17: 0.4 mg via INTRAVENOUS
  Filled 2018-08-17: qty 5

## 2018-08-17 NOTE — Telephone Encounter (Signed)
Called pt with his results. He stated that he would like to speak with physician concerning his results in detail as well as the procedure needed. I advised him that I would forward to Dr. Johnsie Cancel. Pt voiced understanding of plan and looks forward to phone call.

## 2018-08-17 NOTE — Telephone Encounter (Signed)
-----   Message from Josue Hector, MD sent at 08/17/2018  1:51 PM EDT ----- Abnormal myovue needs cath can arrange for next week No work until after cath

## 2018-08-17 NOTE — Telephone Encounter (Signed)
He can come in tomorrow 8:45 or in am sometime

## 2018-08-17 NOTE — Telephone Encounter (Signed)
Pt's wife has an appointment tomorrow to have stent removed from kidney. He will come in by 11:15 am.

## 2018-08-18 ENCOUNTER — Other Ambulatory Visit: Payer: Self-pay

## 2018-08-18 ENCOUNTER — Other Ambulatory Visit: Payer: Self-pay | Admitting: Cardiovascular Disease

## 2018-08-18 DIAGNOSIS — R079 Chest pain, unspecified: Secondary | ICD-10-CM

## 2018-08-18 DIAGNOSIS — Z01818 Encounter for other preprocedural examination: Secondary | ICD-10-CM

## 2018-08-18 MED ORDER — SODIUM CHLORIDE 0.9% FLUSH: 3.0000 mL | Freq: Two times a day (BID) | INTRAVENOUS | Status: DC

## 2018-08-21 ENCOUNTER — Other Ambulatory Visit (HOSPITAL_COMMUNITY)
Admission: RE | Admit: 2018-08-21 | Discharge: 2018-08-21 | Disposition: A | Discharge status: Home / Self Care | Payer: 59 | Source: Ambulatory Visit | Attending: Cardiovascular Disease | Admitting: Cardiovascular Disease

## 2018-08-21 ENCOUNTER — Other Ambulatory Visit: Payer: Self-pay

## 2018-08-21 DIAGNOSIS — R079 Chest pain, unspecified: Secondary | ICD-10-CM | POA: Insufficient documentation

## 2018-08-21 DIAGNOSIS — Z01818 Encounter for other preprocedural examination: Secondary | ICD-10-CM | POA: Insufficient documentation

## 2018-08-21 LAB — SARS CORONAVIRUS 2 BY RT PCR (HOSPITAL ORDER, PERFORMED IN ~~LOC~~ HOSPITAL LAB): SARS Coronavirus 2: NEGATIVE

## 2018-08-21 LAB — CBC WITH DIFFERENTIAL/PLATELET
Abs Immature Granulocytes: 0.01 10*3/uL (ref 0.00–0.07)
Basophils Absolute: 0 10*3/uL (ref 0.0–0.1)
Basophils Relative: 0 %
Eosinophils Absolute: 0.1 10*3/uL (ref 0.0–0.5)
Eosinophils Relative: 2 %
HCT: 46.4 % (ref 39.0–52.0)
Hemoglobin: 15.6 g/dL (ref 13.0–17.0)
Immature Granulocytes: 0 %
Lymphocytes Relative: 32 %
Lymphs Abs: 1.5 10*3/uL (ref 0.7–4.0)
MCH: 29.7 pg (ref 26.0–34.0)
MCHC: 33.6 g/dL (ref 30.0–36.0)
MCV: 88.2 fL (ref 80.0–100.0)
Monocytes Absolute: 0.3 10*3/uL (ref 0.1–1.0)
Monocytes Relative: 5 %
Neutro Abs: 2.9 10*3/uL (ref 1.7–7.7)
Neutrophils Relative %: 61 %
Platelets: 309 10*3/uL (ref 150–400)
RBC: 5.26 MIL/uL (ref 4.22–5.81)
RDW: 13.1 % (ref 11.5–15.5)
WBC: 4.8 10*3/uL (ref 4.0–10.5)
nRBC: 0 % (ref 0.0–0.2)

## 2018-08-21 LAB — BASIC METABOLIC PANEL
Anion gap: 7 (ref 5–15)
BUN: 12 mg/dL (ref 6–20)
CO2: 27 mmol/L (ref 22–32)
Calcium: 9.3 mg/dL (ref 8.9–10.3)
Chloride: 105 mmol/L (ref 98–111)
Creatinine, Ser: 0.9 mg/dL (ref 0.61–1.24)
GFR calc Af Amer: >60 mL/min (ref >60)
GFR calc non Af Amer: >60 mL/min (ref >60)
Glucose, Bld: 100 mg/dL — ABNORMAL HIGH (ref 70–99)
Potassium: 3.9 mmol/L (ref 3.5–5.1)
Sodium: 139 mmol/L (ref 135–145)

## 2018-08-22 ENCOUNTER — Telehealth: Payer: Self-pay | Admitting: *Deleted

## 2018-08-22 NOTE — Telephone Encounter (Signed)
Pt contacted pre-catheterization scheduled at [facility] for: Wednesday August 23, 2018 8:30 AM Verified arrival time and place: [place] Entrance A at: 6:30 AM  Covid-19 test date: 08/21/18  No solid food after midnight prior to cath, clear liquids until 5 AM day of procedure. Contrast allergy: no  AM meds can be taken pre-cath with sip of water including: ASA 81 mg   Confirmed patient has responsible person to drive home post procedure and observe 24 hours after arriving home: yes  Due to Covid-19 pandemic, only one support person will be allowed with patient. Must be the same support person for that patient's entire stay. On arrival the support person will be required to wear a mask and will be screened, including having temporal temperature checked (below 100.4 to be cleared). They will be required to wait in the [facility] waiting room for the duration of the procedure. To limit exposure, MDs will continue to call support person after the procedure instead of speaking with them in consult room.   Patients are required to wear a mask when they enter the hospital.       COVID-19 Pre-Screening Questions:  . In the past 7 to 10 days have you had a cough,  shortness of breath, headache, congestion, fever (100 or greater) body aches, chills, sore throat, or sudden loss of taste or sense of smell? no . Have you been around anyone with known Covid 19? no . Have you been around anyone who is awaiting Covid 19 test results in the past 7 to 10 days? no . Have you been around anyone who has been exposed to Covid 19, or has mentioned symptoms of Covid 19 within the past 7 to 10 days? no   I reviewed procedure/mask/visitor instructions, Covid-19 screening questions with patient, he verbalized understanding, thanked me for call.

## 2018-08-23 ENCOUNTER — Ambulatory Visit (HOSPITAL_COMMUNITY)
Admission: RE | Admit: 2018-08-23 | Discharge: 2018-08-23 | Disposition: A | Discharge status: Home / Self Care | Payer: 59 | Attending: Cardiovascular Disease | Admitting: Cardiovascular Disease

## 2018-08-23 ENCOUNTER — Other Ambulatory Visit: Payer: Self-pay

## 2018-08-23 ENCOUNTER — Encounter (HOSPITAL_COMMUNITY): Payer: Self-pay | Admitting: Cardiovascular Disease

## 2018-08-23 ENCOUNTER — Encounter (HOSPITAL_COMMUNITY)
Admission: RE | Disposition: A | Discharge status: Home / Self Care | Payer: 59 | Source: Home / Self Care | Attending: Cardiovascular Disease

## 2018-08-23 DIAGNOSIS — I2510 Atherosclerotic heart disease of native coronary artery without angina pectoris: Secondary | ICD-10-CM | POA: Diagnosis not present

## 2018-08-23 DIAGNOSIS — R079 Chest pain, unspecified: Secondary | ICD-10-CM

## 2018-08-23 DIAGNOSIS — R9439 Abnormal result of other cardiovascular function study: Secondary | ICD-10-CM | POA: Diagnosis present

## 2018-08-23 DIAGNOSIS — K219 Gastro-esophageal reflux disease without esophagitis: Secondary | ICD-10-CM | POA: Diagnosis not present

## 2018-08-23 DIAGNOSIS — R002 Palpitations: Secondary | ICD-10-CM | POA: Diagnosis not present

## 2018-08-23 DIAGNOSIS — Z79899 Other long term (current) drug therapy: Secondary | ICD-10-CM | POA: Insufficient documentation

## 2018-08-23 HISTORY — PX: LEFT HEART CATH AND CORONARY ANGIOGRAPHY: CATH118249

## 2018-08-23 SURGERY — LEFT HEART CATH AND CORONARY ANGIOGRAPHY
Anesthesia: LOCAL

## 2018-08-23 MED ORDER — IOHEXOL 350 MG/ML SOLN
INTRAVENOUS | Status: DC | PRN
  Administered 2018-08-23: 60 mL

## 2018-08-23 MED ORDER — SODIUM CHLORIDE 0.9 % IV SOLN: 250.0000 mL | INTRAVENOUS | Status: DC | PRN

## 2018-08-23 MED ORDER — SODIUM CHLORIDE 0.9 % WEIGHT BASED INFUSION: 1.0000 mL/kg/h | INTRAVENOUS | Status: DC

## 2018-08-23 MED ORDER — SODIUM CHLORIDE 0.9 % WEIGHT BASED INFUSION
3.0000 mL/kg/h | INTRAVENOUS | Status: AC
  Administered 2018-08-23: 3 mL/kg/h via INTRAVENOUS

## 2018-08-23 MED ORDER — HEPARIN (PORCINE) IN NACL 1000-0.9 UT/500ML-% IV SOLN
INTRAVENOUS | Status: AC
  Filled 2018-08-23: qty 1000

## 2018-08-23 MED ORDER — SODIUM CHLORIDE 0.9% FLUSH: 3.0000 mL | Freq: Two times a day (BID) | INTRAVENOUS | Status: DC

## 2018-08-23 MED ORDER — MIDAZOLAM HCL 2 MG/2ML IJ SOLN
INTRAMUSCULAR | Status: AC
  Filled 2018-08-23: qty 2

## 2018-08-23 MED ORDER — ASPIRIN 81 MG PO CHEW: 81.0000 mg | CHEWABLE_TABLET | ORAL | Status: DC

## 2018-08-23 MED ORDER — SODIUM CHLORIDE 0.9% FLUSH: 3.0000 mL | INTRAVENOUS | Status: DC | PRN

## 2018-08-23 MED ORDER — HYDRALAZINE HCL 20 MG/ML IJ SOLN: 10.0000 mg | INTRAMUSCULAR | Status: DC | PRN

## 2018-08-23 MED ORDER — LIDOCAINE HCL (PF) 1 % IJ SOLN
INTRAMUSCULAR | Status: AC
  Filled 2018-08-23: qty 30

## 2018-08-23 MED ORDER — LIDOCAINE HCL (PF) 1 % IJ SOLN
INTRAMUSCULAR | Status: DC | PRN
  Administered 2018-08-23: 2 mL

## 2018-08-23 MED ORDER — HEPARIN (PORCINE) IN NACL 1000-0.9 UT/500ML-% IV SOLN
INTRAVENOUS | Status: DC | PRN
  Administered 2018-08-23 (×2): 500 mL

## 2018-08-23 MED ORDER — HEPARIN SODIUM (PORCINE) 1000 UNIT/ML IJ SOLN
INTRAMUSCULAR | Status: AC
  Filled 2018-08-23: qty 1

## 2018-08-23 MED ORDER — FENTANYL CITRATE (PF) 100 MCG/2ML IJ SOLN
INTRAMUSCULAR | Status: AC
  Filled 2018-08-23: qty 2

## 2018-08-23 MED ORDER — LABETALOL HCL 5 MG/ML IV SOLN: 10.0000 mg | INTRAVENOUS | Status: DC | PRN

## 2018-08-23 MED ORDER — FENTANYL CITRATE (PF) 100 MCG/2ML IJ SOLN
INTRAMUSCULAR | Status: DC | PRN
  Administered 2018-08-23: 50 ug via INTRAVENOUS

## 2018-08-23 MED ORDER — MIDAZOLAM HCL 2 MG/2ML IJ SOLN
INTRAMUSCULAR | Status: DC | PRN
  Administered 2018-08-23: 2 mg via INTRAVENOUS

## 2018-08-23 MED ORDER — ACETAMINOPHEN 325 MG PO TABS: 650.0000 mg | ORAL_TABLET | ORAL | Status: DC | PRN

## 2018-08-23 MED ORDER — VERAPAMIL HCL 2.5 MG/ML IV SOLN
INTRAVENOUS | Status: DC | PRN
  Administered 2018-08-23: 10 mL via INTRA_ARTERIAL

## 2018-08-23 MED ORDER — ONDANSETRON HCL 4 MG/2ML IJ SOLN: 4.0000 mg | Freq: Four times a day (QID) | INTRAMUSCULAR | Status: DC | PRN

## 2018-08-23 MED ORDER — SODIUM CHLORIDE 0.9 % IV SOLN: INTRAVENOUS | Status: AC

## 2018-08-23 MED ORDER — HEPARIN SODIUM (PORCINE) 1000 UNIT/ML IJ SOLN
INTRAMUSCULAR | Status: DC | PRN
  Administered 2018-08-23: 4500 [IU] via INTRAVENOUS

## 2018-08-23 SURGICAL SUPPLY — 13 items
BAND CMPR LRG ZPHR (HEMOSTASIS) ×1
BAND ZEPHYR COMPRESS 30 LONG (HEMOSTASIS) ×1 IMPLANT
CATH 5FR JL3.5 JR4 ANG PIG MP (CATHETERS) ×1 IMPLANT
GUIDEWIRE INQWIRE 1.5J.035X260 (WIRE) IMPLANT
INQWIRE 1.5J .035X260CM (WIRE) ×2
KIT HEART LEFT (KITS) ×2 IMPLANT
NDL PERC 21GX4CM (NEEDLE) IMPLANT
NEEDLE PERC 21GX4CM (NEEDLE) ×2 IMPLANT
PACK CARDIAC CATHETERIZATION (CUSTOM PROCEDURE TRAY) ×2 IMPLANT
SHEATH RAIN RADIAL 21G 6FR (SHEATH) ×1 IMPLANT
TRANSDUCER W/STOPCOCK (MISCELLANEOUS) ×2 IMPLANT
TUBING CIL FLEX 10 FLL-RA (TUBING) ×2 IMPLANT
WIRE MICROINTRODUCER 60CM (WIRE) ×1 IMPLANT

## 2018-08-23 NOTE — Discharge Instructions (Signed)
Radial Site Care ° °This sheet gives you information about how to care for yourself after your procedure. Your health care provider may also give you more specific instructions. If you have problems or questions, contact your health care provider. °What can I expect after the procedure? °After the procedure, it is common to have: °· Bruising and tenderness at the catheter insertion area. °Follow these instructions at home: °Medicines °· Take over-the-counter and prescription medicines only as told by your health care provider. °Insertion site care °· Follow instructions from your health care provider about how to take care of your insertion site. Make sure you: °? Wash your hands with soap and water before you change your bandage (dressing). If soap and water are not available, use hand sanitizer. °? Change your dressing as told by your health care provider. °? Leave stitches (sutures), skin glue, or adhesive strips in place. These skin closures may need to stay in place for 2 weeks or longer. If adhesive strip edges start to loosen and curl up, you may trim the loose edges. Do not remove adhesive strips completely unless your health care provider tells you to do that. °· Check your insertion site every day for signs of infection. Check for: °? Redness, swelling, or pain. °? Fluid or blood. °? Pus or a bad smell. °? Warmth. °· Do not take baths, swim, or use a hot tub until your health care provider approves. °· You may shower 24-48 hours after the procedure, or as directed by your health care provider. °? Remove the dressing and gently wash the site with plain soap and water. °? Pat the area dry with a clean towel. °? Do not rub the site. That could cause bleeding. °· Do not apply powder or lotion to the site. °Activity ° °· For 24 hours after the procedure, or as directed by your health care provider: °? Do not flex or bend the affected arm. °? Do not push or pull heavy objects with the affected arm. °? Do not  drive yourself home from the hospital or clinic. You may drive 24 hours after the procedure unless your health care provider tells you not to. °? Do not operate machinery or power tools. °· Do not lift anything that is heavier than 10 lb (4.5 kg), or the limit that you are told, until your health care provider says that it is safe. °· Ask your health care provider when it is okay to: °? Return to work or school. °? Resume usual physical activities or sports. °? Resume sexual activity. °General instructions °· If the catheter site starts to bleed, raise your arm and put firm pressure on the site. If the bleeding does not stop, get help right away. This is a medical emergency. °· If you went home on the same day as your procedure, a responsible adult should be with you for the first 24 hours after you arrive home. °· Keep all follow-up visits as told by your health care provider. This is important. °Contact a health care provider if: °· You have a fever. °· You have redness, swelling, or yellow drainage around your insertion site. °Get help right away if: °· You have unusual pain at the radial site. °· The catheter insertion area swells very fast. °· The insertion area is bleeding, and the bleeding does not stop when you hold steady pressure on the area. °· Your arm or hand becomes pale, cool, tingly, or numb. °These symptoms may represent a serious problem   that is an emergency. Do not wait to see if the symptoms will go away. Get medical help right away. Call your local emergency services (911 in the [place] Do not drive yourself to the hospital. °Summary °· After the procedure, it is common to have bruising and tenderness at the site. °· Follow instructions from your health care provider about how to take care of your radial site wound. Check the wound every day for signs of infection. °· Do not lift anything that is heavier than 10 lb (4.5 kg), or the limit that you are told, until your health care provider says  that it is safe. °This information is not intended to replace advice given to you by your health care provider. Make sure you discuss any questions you have with your health care provider. °Document Released: 02/20/2010 Document Revised: 02/23/2017 Document Reviewed: 02/23/2017 °Elsevier Patient Education © 2020 [employer] ° °

## 2018-08-23 NOTE — Interval H&P Note (Signed)
History and Physical Interval Note:  08/23/2018 7:42 AM  Mathew Moore  has presented today for surgery, with the diagnosis of unstable angina/Abnormal stress test.  The various methods of treatment have been discussed with the patient and family. After consideration of risks, benefits and other options for treatment, the patient has consented to  Procedure(s): LEFT HEART CATH AND CORONARY ANGIOGRAPHY (N/A) as a surgical intervention.  The patient's history has been reviewed, patient examined, no change in status, stable for surgery.  I have reviewed the patient's chart and labs.  Questions were answered to the patient's satisfaction.    Cath Lab Visit (complete for each Cath Lab visit)  Clinical Evaluation Leading to the Procedure:   ACS: No.  Non-ACS:    Anginal Classification: CCS III  Anti-ischemic medical therapy: No Therapy  Non-Invasive Test Results: High-risk stress test findings: cardiac mortality >3%/year  Prior CABG: No previous CABG        Lauree Chandler

## 2018-08-25 ENCOUNTER — Ambulatory Visit (INDEPENDENT_AMBULATORY_CARE_PROVIDER_SITE_OTHER): Payer: 59 | Admitting: *Deleted

## 2018-08-25 ENCOUNTER — Other Ambulatory Visit: Payer: Self-pay

## 2018-08-25 ENCOUNTER — Ambulatory Visit: Payer: 59 | Admitting: Psychology

## 2018-08-25 VITALS — BP 136/87 | HR 75 | Temp 96.9°F | Ht 67.0 in

## 2018-08-25 DIAGNOSIS — M79601 Pain in right arm: Secondary | ICD-10-CM

## 2018-08-25 NOTE — Progress Notes (Signed)
Pt presents to office w/ rt arm pain and numbness. No swelling or redness noted at this time to wrist.

## 2018-08-26 NOTE — Progress Notes (Signed)
    Examined patient's wrist at the time of his nurse visit. No erythema or swelling at the radial cath site. Mostly reported episodes of paraesthesias along his forearm. Reviewed this was likely due to irritation of the nerve from his recent catheterization. Recommended utilizing Tylenol as needed and using a stress ball or playdough to try to help with this. Informed the patient to make [place] aware if his pain worsens or if he develops swelling or other symptoms along the site.   Signed, Erma Heritage, PA-C 08/26/2018, 8:24 AM Pager: [phone]

## 2018-08-26 NOTE — Addendum Note (Signed)
Addended by: Erma Heritage on: 08/26/2018 08:26 AM   Modules accepted: Level of Service

## 2018-08-28 ENCOUNTER — Telehealth: Payer: Self-pay | Admitting: Cardiovascular Disease

## 2018-08-28 NOTE — Telephone Encounter (Signed)
Sent release of information to [employer] 08/28/18 4 pages

## 2018-12-07 DIAGNOSIS — H9211 Otorrhea, right ear: Secondary | ICD-10-CM | POA: Insufficient documentation

## 2019-06-01 ENCOUNTER — Emergency Department (HOSPITAL_COMMUNITY): Payer: 59

## 2019-06-01 ENCOUNTER — Other Ambulatory Visit: Payer: Self-pay

## 2019-06-01 ENCOUNTER — Emergency Department (HOSPITAL_COMMUNITY)
Admission: EM | Admit: 2019-06-01 | Discharge: 2019-06-01 | Disposition: A | Discharge status: Home / Self Care | Payer: 59 | Attending: Emergency Medicine | Admitting: Emergency Medicine

## 2019-06-01 ENCOUNTER — Encounter (HOSPITAL_COMMUNITY): Payer: Self-pay | Admitting: *Deleted

## 2019-06-01 DIAGNOSIS — Z79899 Other long term (current) drug therapy: Secondary | ICD-10-CM | POA: Diagnosis not present

## 2019-06-01 DIAGNOSIS — R42 Dizziness and giddiness: Secondary | ICD-10-CM | POA: Insufficient documentation

## 2019-06-01 DIAGNOSIS — I10 Essential (primary) hypertension: Secondary | ICD-10-CM

## 2019-06-01 DIAGNOSIS — R591 Generalized enlarged lymph nodes: Secondary | ICD-10-CM | POA: Diagnosis not present

## 2019-06-01 DIAGNOSIS — R519 Headache, unspecified: Secondary | ICD-10-CM | POA: Diagnosis not present

## 2019-06-01 DIAGNOSIS — R0789 Other chest pain: Secondary | ICD-10-CM | POA: Insufficient documentation

## 2019-06-01 DIAGNOSIS — R079 Chest pain, unspecified: Secondary | ICD-10-CM

## 2019-06-01 DIAGNOSIS — R0602 Shortness of breath: Secondary | ICD-10-CM | POA: Insufficient documentation

## 2019-06-01 LAB — CBC WITH DIFFERENTIAL/PLATELET
Abs Immature Granulocytes: 0.02 10*3/uL (ref 0.00–0.07)
Basophils Absolute: 0.1 10*3/uL (ref 0.0–0.1)
Basophils Relative: 1 %
Eosinophils Absolute: 0.1 10*3/uL (ref 0.0–0.5)
Eosinophils Relative: 2 %
HCT: 46.8 % (ref 39.0–52.0)
Hemoglobin: 15.5 g/dL (ref 13.0–17.0)
Immature Granulocytes: 0 %
Lymphocytes Relative: 31 %
Lymphs Abs: 1.5 10*3/uL (ref 0.7–4.0)
MCH: 29.3 pg (ref 26.0–34.0)
MCHC: 33.1 g/dL (ref 30.0–36.0)
MCV: 88.5 fL (ref 80.0–100.0)
Monocytes Absolute: 0.3 10*3/uL (ref 0.1–1.0)
Monocytes Relative: 6 %
Neutro Abs: 2.9 10*3/uL (ref 1.7–7.7)
Neutrophils Relative %: 60 %
Platelets: 362 10*3/uL (ref 150–400)
RBC: 5.29 MIL/uL (ref 4.22–5.81)
RDW: 13.1 % (ref 11.5–15.5)
WBC: 4.8 10*3/uL (ref 4.0–10.5)
nRBC: 0 % (ref 0.0–0.2)

## 2019-06-01 LAB — BASIC METABOLIC PANEL
Anion gap: 10 (ref 5–15)
BUN: 16 mg/dL (ref 6–20)
CO2: 25 mmol/L (ref 22–32)
Calcium: 9.2 mg/dL (ref 8.9–10.3)
Chloride: 104 mmol/L (ref 98–111)
Creatinine, Ser: 1.03 mg/dL (ref 0.61–1.24)
GFR calc Af Amer: >60 mL/min (ref >60)
GFR calc non Af Amer: >60 mL/min (ref >60)
Glucose, Bld: 100 mg/dL — ABNORMAL HIGH (ref 70–99)
Potassium: 4.3 mmol/L (ref 3.5–5.1)
Sodium: 139 mmol/L (ref 135–145)

## 2019-06-01 LAB — D-DIMER, QUANTITATIVE: D-Dimer, Quant: <0.27 ug/mL-FEU (ref 0.00–0.50)

## 2019-06-01 LAB — TROPONIN I (HIGH SENSITIVITY)
Troponin I (High Sensitivity): 2 ng/L (ref <18)
Troponin I (High Sensitivity): 3 ng/L (ref <18)

## 2019-06-01 MED ORDER — ONDANSETRON HCL 4 MG/2ML IJ SOLN
4.0000 mg | Freq: Once | INTRAMUSCULAR | Status: AC
  Administered 2019-06-01: 12:00:00 4 mg via INTRAVENOUS
  Filled 2019-06-01: qty 2

## 2019-06-01 MED ORDER — LIDOCAINE VISCOUS HCL 2 % MT SOLN
15.0000 mL | Freq: Once | OROMUCOSAL | Status: AC
  Administered 2019-06-01: 15:00:00 15 mL via ORAL
  Filled 2019-06-01: qty 15

## 2019-06-01 MED ORDER — IOHEXOL 350 MG/ML SOLN
100.0000 mL | Freq: Once | INTRAVENOUS | Status: AC | PRN
  Administered 2019-06-01: 14:00:00 100 mL via INTRAVENOUS

## 2019-06-01 MED ORDER — ALUM & MAG HYDROXIDE-SIMETH 200-200-20 MG/5ML PO SUSP
30.0000 mL | Freq: Once | ORAL | Status: AC
  Administered 2019-06-01: 15:00:00 30 mL via ORAL
  Filled 2019-06-01: qty 30

## 2019-06-01 MED ORDER — MORPHINE SULFATE (PF) 4 MG/ML IV SOLN
4.0000 mg | Freq: Once | INTRAVENOUS | Status: AC
  Administered 2019-06-01: 12:00:00 4 mg via INTRAVENOUS
  Filled 2019-06-01: qty 1

## 2019-06-01 MED ORDER — METOCLOPRAMIDE HCL 5 MG/ML IJ SOLN
10.0000 mg | Freq: Once | INTRAMUSCULAR | Status: AC
  Administered 2019-06-01: 15:00:00 10 mg via INTRAVENOUS
  Filled 2019-06-01: qty 2

## 2019-06-01 MED ORDER — ASPIRIN 81 MG PO CHEW
324.0000 mg | CHEWABLE_TABLET | Freq: Once | ORAL | Status: AC
  Administered 2019-06-01: 324 mg via ORAL
  Filled 2019-06-01: qty 4

## 2019-06-01 NOTE — ED Provider Notes (Signed)
[facility] EMERGENCY DEPARTMENT Provider Note   CSN: [id number] Arrival date & time: 06/01/19  1141     History Chief Complaint  Patient presents with  . Chest Pain    Mathew Moore is a 54 y.o. male.  He started having chest pain about 4 days ago.  It was associated with elevated blood pressure.  Starting last evening he had a bad headache.  Chest pain worsened today.  Rates it 10 out of 10 pressure.  No radiation.  Associated with shortness of breath and diaphoresis.  HPI  HPI: A 54 year old patient with a history of hypertension presents for evaluation of chest pain. Initial onset of pain was approximately 3-6 hours ago. The patient's chest pain is described as heaviness/pressure/tightness and is not worse with exertion. The patient reports some diaphoresis. The patient's chest pain is middle- or left-sided, is not well-localized, is not sharp and does not radiate to the arms/jaw/neck. The patient does not complain of nausea. The patient has no history of stroke, has no history of peripheral artery disease, has not smoked in the past 90 days, denies any history of treated diabetes, has no relevant family history of coronary artery disease (first degree relative at less than age 77), has no history of hypercholesterolemia and does not have an elevated BMI (>=30).   History reviewed. No pertinent past medical history.  Patient Active Problem List   Diagnosis Date Noted  . Abnormal stress test   . Chest pain     Past Surgical History:  Procedure Laterality Date  . HERNIA REPAIR    . LEFT HEART CATH AND CORONARY ANGIOGRAPHY N/A 08/23/2018   Procedure: LEFT HEART CATH AND CORONARY ANGIOGRAPHY;  Surgeon: Burnell Blanks, MD;  Location: [place] [facility];  Service: Cardiovascular;  Laterality: N/A;       History reviewed. No pertinent family history.  Social History   Tobacco Use  . Smoking status: Never Smoker  . Smokeless tobacco: Never Used  Substance Use Topics   . Alcohol use: Not Currently  . Drug use: Not Currently    Home Medications Prior to Admission medications   Medication Sig Start Date End Date Taking? Authorizing Provider  acetaminophen (TYLENOL) 500 MG tablet Take 1,000 mg by mouth every 6 (six) hours as needed for moderate pain or headache.    [provider]  calcium carbonate (TUMS - DOSED IN MG ELEMENTAL CALCIUM) 500 MG chewable tablet Chew 2 tablets by mouth every 4 (four) hours as needed for indigestion or heartburn.    [provider]  citalopram (CELEXA) 20 MG tablet Take 20 mg by mouth daily. 07/28/18   [provider]  LORazepam (ATIVAN) 0.5 MG tablet Take 0.5 mg by mouth 3 (three) times daily as needed for anxiety.  07/26/18   [provider]  Melatonin 5 MG TABS Take 5 mg by mouth at bedtime.     [provider]    Allergies    Patient has no known allergies.  Review of Systems   Review of Systems  Constitutional: Positive for diaphoresis. Negative for fever.  HENT: Negative for sore throat.   Eyes: Negative for visual disturbance.  Respiratory: Positive for shortness of breath.   Cardiovascular: Positive for chest pain.  Gastrointestinal: Negative for abdominal pain.  Genitourinary: Negative for dysuria.  Musculoskeletal: Negative for neck pain.  Skin: Negative for rash.  Neurological: Positive for headaches.    Physical Exam Updated Vital Signs BP (!) 141/94   Pulse  84   Resp (!) 48   Ht 5\' 10"  (1.778 m)   Wt 83.9 kg   SpO2 100%   BMI 26.54 kg/m   Physical Exam Vitals and nursing note reviewed.  Constitutional:      Appearance: He is well-developed.  HENT:     Head: Normocephalic and atraumatic.  Eyes:     Conjunctiva/sclera: Conjunctivae normal.  Cardiovascular:     Rate and Rhythm: Regular rhythm. Bradycardia present.     Heart sounds: Normal heart sounds. No murmur.  Pulmonary:     Effort: Pulmonary effort is normal. No respiratory distress.      Breath sounds: Normal breath sounds.  Abdominal:     Palpations: Abdomen is soft.     Tenderness: There is no abdominal tenderness.  Musculoskeletal:        General: Normal range of motion.     Cervical back: Neck supple.     Right lower leg: No tenderness. No edema.     Left lower leg: No tenderness. No edema.  Skin:    General: Skin is warm and dry.     Capillary Refill: Capillary refill takes less than 2 seconds.  Neurological:     General: No focal deficit present.     Mental Status: He is alert.     ED Results / Procedures / Treatments   Labs (all labs ordered are listed, but only abnormal results are displayed) Labs Reviewed  BASIC METABOLIC PANEL - Abnormal; Notable for the following components:      Result Value   Glucose, Bld 100 (*)    All other components within normal limits  CBC WITH DIFFERENTIAL/PLATELET  D-DIMER, QUANTITATIVE (NOT AT [facility])  TROPONIN I (HIGH SENSITIVITY)  TROPONIN I (HIGH SENSITIVITY)    EKG EKG Interpretation  Date/Time:  Friday June 01 2019 11:49:22 EDT Ventricular Rate:  78 PR Interval:    QRS Duration: 103 QT Interval:  358 QTC Calculation: 408 R Axis:   -19 Text Interpretation: Sinus rhythm Borderline left axis deviation No significant change since prior 6/20 Confirmed by Aletta Edouard [phone]) on 06/01/2019 11:55:13 AM   Radiology CT Head Wo Contrast  Result Date: 06/01/2019 CLINICAL DATA:  Headache and dizziness EXAM: CT HEAD WITHOUT CONTRAST TECHNIQUE: Contiguous axial images were obtained from the base of the skull through the vertex without intravenous contrast. COMPARISON:  None. FINDINGS: Brain: Ventricles and sulci are normal in size and configuration. There is no intracranial mass, hemorrhage, extra-axial fluid collection, or midline shift. The brain parenchyma appears unremarkable. No acute infarct evident. Vascular: No hyperdense vessel.  No evident vascular calcification. Skull: Bony calvarium appears intact.  Sinuses/Orbits: There is mild mucosal thickening in the left maxillary antrum. There is mucosal thickening in several ethmoid air cells. There is rightward deviation of the nasal septum. Orbits appear symmetric bilaterally. Other: Mastoid air cells are clear. IMPRESSION: Brain parenchyma unremarkable.  No mass or hemorrhage. There are foci of paranasal sinus disease. There is rightward deviation of the nasal septum. Electronically Signed   By: Lowella Grip III M.D.   On: 06/01/2019 13:55   DG Chest Port 1 View  Result Date: 06/01/2019 CLINICAL DATA:  Chest pain and shortness of breath EXAM: PORTABLE CHEST 1 VIEW COMPARISON:  July 25, 2018 FINDINGS: Lungs are clear. Heart is mildly enlarged with pulmonary vascularity normal. No adenopathy. No pneumothorax. No bone lesions. IMPRESSION: Mild cardiomegaly.  Lungs clear. Electronically Signed   By: Lowella Grip III M.D.   On: 06/01/2019 12:33  CT Angio Chest/Abd/Pel for Dissection W and/or W/WO  Result Date: 06/01/2019 CLINICAL DATA:  Chest pain, acute aortic syndrome suspected EXAM: CT ANGIOGRAPHY CHEST, ABDOMEN AND PELVIS TECHNIQUE: Non-contrast CT of the chest was initially obtained. Multidetector CT imaging through the chest, abdomen and pelvis was performed using the standard protocol during bolus administration of intravenous contrast. Multiplanar reconstructed images and MIPs were obtained and reviewed to evaluate the vascular anatomy. CONTRAST:  126mL OMNIPAQUE IOHEXOL 350 MG/ML SOLN COMPARISON:  None. FINDINGS: CTA CHEST FINDINGS Cardiovascular: Preferential opacification of the thoracic aorta. Normal contour and caliber of the thoracic aorta. No evidence of aneurysm, dissection, or other acute aortic syndrome. Scattered aortic atherosclerosis. Normal heart size. Three-vessel coronary artery calcifications. No pericardial effusion. Mediastinum/Nodes: There are numerous enlarged mediastinal and bilateral hilar lymph nodes, largest pretracheal  lymph nodes measuring up to 2.4 x 2.4 cm (series 5, image 35). Thyroid gland, trachea, and esophagus demonstrate no significant findings. Lungs/Pleura: Lungs are clear. There is a nonspecific 7 mm fissural nodule of the left upper lobe (series 7, image 46). No pleural effusion or pneumothorax. Musculoskeletal: No chest wall abnormality. No acute or significant osseous findings. Review of the MIP images confirms the above findings. CTA ABDOMEN AND PELVIS FINDINGS VASCULAR Normal contour and caliber of the abdominal aorta. No evidence of aneurysm, dissection, or other acute aortic syndrome. Standard branching pattern of the abdominal aorta, with solitary bilateral renal arteries. Mild mixed calcific atherosclerosis. Review of the MIP images confirms the above findings. NON-VASCULAR Hepatobiliary: No solid liver abnormality is seen. Hepatic steatosis. No gallstones, gallbladder wall thickening, or biliary dilatation. Pancreas: Unremarkable. No pancreatic ductal dilatation or surrounding inflammatory changes. Spleen: Normal in size without significant abnormality. Adrenals/Urinary Tract: Adrenal glands are unremarkable. Kidneys are normal, without renal calculi, solid lesion, or hydronephrosis. Bladder is unremarkable. Stomach/Bowel: Stomach is within normal limits. Appendix appears normal. There is hazy fat stranding of the central small bowel mesentery in the left hemiabdomen with prominent although not pathologically enlarged lymph nodes (series 5, image 131). Lymphatic: No enlarged abdominal or pelvic lymph nodes. Reproductive: No mass or other significant abnormality. Other: No abdominal wall hernia or abnormality. No abdominopelvic ascites. Musculoskeletal: No acute or significant osseous findings. Review of the MIP images confirms the above findings. IMPRESSION: 1. Normal contour and caliber of the thoracic and abdominal aorta without evidence of aneurysm, dissection, or other acute aortic syndrome. Mild  atherosclerosis. Aortic Atherosclerosis (ICD10-I70.0). 2. Numerous enlarged mediastinal and bilateral hilar lymph nodes, of uncertain nature although concerning for malignancy given size, including metastatic disease or lymphoma. Non malignant differential considerations include sarcoidosis. These may be characterized for metabolic activity by FDG PET. 3. Nonspecific 7 mm fissural pulmonary nodule of the left upper lobe. This nodule may be characterized for metabolic activity by PET-CT in conjunction with above. 4.  Coronary artery disease. 5.  Hepatic steatosis. Electronically Signed   By: Eddie Candle M.D.   On: 06/01/2019 14:08    Procedures Procedures (including critical care time)  Medications Ordered in ED Medications  morphine 4 MG/ML injection 4 mg (4 mg Intravenous Given 06/01/19 1222)  ondansetron (ZOFRAN) injection 4 mg (4 mg Intravenous Given 06/01/19 1222)  aspirin chewable tablet 324 mg (324 mg Oral Given 06/01/19 1227)  iohexol (OMNIPAQUE) 350 MG/ML injection 100 mL (100 mLs Intravenous Contrast Given 06/01/19 1332)  metoCLOPramide (REGLAN) injection 10 mg (10 mg Intravenous Given 06/01/19 1500)  alum & mag hydroxide-simeth (MAALOX/MYLANTA) 200-200-20 MG/5ML suspension 30 mL (30 mLs Oral Given 06/01/19 1515)  And  lidocaine (XYLOCAINE) 2 % viscous mouth solution 15 mL (15 mLs Oral Given 06/01/19 1515)    ED Course  I have reviewed the triage vital signs and the nursing notes.  Pertinent labs & imaging results that were available during my care of the patient were reviewed by me and considered in my medical decision making (see chart for details).  Clinical Course as of Jun 01 1931  Fri Jun 01, 2019  1455 Ct chest didn't cross into epic yet -  IMPRESSION: 1. Normal contour and caliber of the thoracic and abdominal aorta without evidence of aneurysm, dissection, or other acute aortic syndrome. Mild atherosclerosis. Aortic Atherosclerosis (ICD10-I70.0).  2. Numerous enlarged  mediastinal and bilateral hilar lymph nodes, of uncertain nature although concerning for malignancy given size, including metastatic disease or lymphoma. Non malignant differential considerations include sarcoidosis. These may be characterized for metabolic activity by FDG PET.  3. Nonspecific 7 mm fissural pulmonary nodule of the left upper lobe. This nodule may be characterized for metabolic activity by PET-CT in conjunction with above.  4. Coronary artery disease.  5. Hepatic steatosis.   [MB]  [id number] Says chest pain is down to a 6 and this headache is improving somewhat.   [MB]    Clinical Course User Index [MB] Hayden Rasmussen, MD   MDM Rules/Calculators/A&P HEAR Score: 4                   This patient complains of chest pain and headache; this involves an extensive number of treatment Options and is a complaint that carries with it a high risk of complications and Morbidity. The differential includes ACS, dissection, bleed, urgency, GERD, thorax, metabolic derangement  I ordered, reviewed and interpreted labs, which included normal white count normal hemoglobin negative D-dimer stable troponins low, normal chemistries I ordered medication morphine, aspirin, Zofran, Reglan, GI cocktail with improvement in his symptoms I ordered imaging studies which included portable chest x-ray along with CT of head and CT chest abdomen pelvis dissection study and I independently    visualized and interpreted imaging which showed normal chest, no intracranial bleed, no evidence of dissection Additional history obtained from patient's wife Previous records obtained and reviewed in epic including admission last year and subsequent catheterization that showed minimal disease  After the interventions stated above, I reevaluated the patient and found patient symptoms to be improved.  No evidence of cardiac injury.  No vascular catastrophe, symptoms improved in the emergency department.  He has  follow-up with his primary care doctor and recommended that he also contact cardiology for close follow-up.  Return instructions discussed.  Also reviewed with patient the incidental finding of lymphadenopathy in the chest and need for further work-up.  Final Clinical Impression(s) / ED Diagnoses Final diagnoses:  Nonspecific chest pain  Generalized headache  Essential hypertension  Lymphadenopathy    Rx / DC Orders ED Discharge Orders    None       Hayden Rasmussen, MD 06/01/19 Joen Laura

## 2019-06-01 NOTE — ED Triage Notes (Signed)
Chest pain

## 2019-06-01 NOTE — Discharge Instructions (Addendum)
You were seen in the emergency department for evaluation of chest pain and headache. You had blood work EKG chest x-ray along with a CAT scan of your head and CAT scan of your chest abdomen and pelvis. There is no evidence of heart attack or blood clot. Your symptoms improved while you were here. Your CAT scan did show some enlargement of your lymph nodes in your chest. This is going to require further testing. Please contact your primary care doctor for follow-up. You should also schedule follow-up with cardiology. Return to the emergency department if any worsening or concerning symptoms. Below is the impression from the CAT scan that you need to discuss with your doctor.  MPRESSION: 1. Normal contour and caliber of the thoracic and abdominal aorta without evidence of aneurysm, dissection, or other acute aortic syndrome. Mild atherosclerosis. Aortic Atherosclerosis (ICD10-I70.0).  2. Numerous enlarged mediastinal and bilateral hilar lymph nodes, of uncertain nature although concerning for malignancy given size, including metastatic disease or lymphoma. Non malignant differential considerations include sarcoidosis. These may be characterized for metabolic activity by FDG PET.  3. Nonspecific 7 mm fissural pulmonary nodule of the left upper lobe. This nodule may be characterized for metabolic activity by PET-CT in conjunction with above.  4. Coronary artery disease.  5. Hepatic steatosis

## 2019-06-04 NOTE — Progress Notes (Signed)
.    Cardiology Office Note    Date:  06/05/2019   ID:  Mathew Moore, DOB 01/02/1966, MRN [id number]  PCP:  Manon Hilding, MD  Cardiologist: Jenkins Rouge, MD EPS: None  Chief Complaint  Patient presents with  . Hospitalization Follow-up    History of Present Illness:  Mathew Moore is a 54 y.o. male with history chest pain, false positive NST 08/17/2018 of mild nonobstructive CAD on cath 08/23/18  Patient went to the ER 06/01/2019 with 4-day history of chest pain associated with elevated blood pressures and headache.  Was treated with Zofran Reglan GI cocktail with improvement in symptoms. CT no evidence of aneurysm or dissection but numerous enlarged mediastinal and bilateral hilar lymph nodes of uncertain nature although concerning for malignancy given size including metastatic disease or lymphoma.  Nonmalignant differential could include sarcoidosisys wh recommended PET scan.  Nonspecific 7 mm fissure pulmonary nodule in the left upper lobe  Patient comes in for f/u. Says he felt like a ton of bricks was laying on his chest with sweating and nausea. BP was up all week and headache. Started on losartan 25 mg Monday because lisinopril started  1 1/2 months ago caused a dry cough and watching salt closely. Says when he overexerts himself he gets very sweaty followed by elevated BP. Concerned over enlarged lymph nodes. Doesn't think he has regular GI problems despite relief from GI cocktail.  History reviewed. No pertinent past medical history.  Past Surgical History:  Procedure Laterality Date  . HERNIA REPAIR    . LEFT HEART CATH AND CORONARY ANGIOGRAPHY N/A 08/23/2018   Procedure: LEFT HEART CATH AND CORONARY ANGIOGRAPHY;  Surgeon: Burnell Blanks, MD;  Location: [facility];  Service: Cardiovascular;  Laterality: N/A;    Current Medications: Current Meds  Medication Sig  . acetaminophen (TYLENOL) 500 MG tablet Take 1,000 mg by mouth every 6 (six) hours as needed  for moderate pain or headache.  Marland Kitchen alum & mag hydroxide-simeth (MAALOX/MYLANTA) 200-200-20 MG/5ML suspension Take 15 mLs by mouth as needed for indigestion or heartburn.  . calcium carbonate (TUMS - DOSED IN MG ELEMENTAL CALCIUM) 500 MG chewable tablet Chew 2 tablets by mouth every 4 (four) hours as needed for indigestion or heartburn.  . losartan (COZAAR) 25 MG tablet Take 25 mg by mouth daily.  . meclizine (ANTIVERT) 25 MG tablet Take 25 mg by mouth 3 (three) times daily as needed for dizziness.  Marland Kitchen zolpidem (AMBIEN) 10 MG tablet Take 1 tablet by mouth at bedtime.   Current Facility-Administered Medications for the 06/05/19 encounter (Office Visit) with Imogene Burn, PA-C  Medication  . sodium chloride flush (NS) 0.9 % injection 3 mL     Allergies:   Patient has no known allergies.   Social History   Socioeconomic History  . Marital status: Married    Spouse name: Not on file  . Number of children: Not on file  . Years of education: Not on file  . Highest education level: Not on file  Occupational History  . Not on file  Tobacco Use  . Smoking status: Never Smoker  . Smokeless tobacco: Never Used  Substance and Sexual Activity  . Alcohol use: Not Currently  . Drug use: Not Currently  . Sexual activity: Not on file  Other Topics Concern  . Not on file  Social History Narrative  . Not on file   Social Determinants of Health   Financial Resource Strain:   .  Difficulty of Paying Living Expenses:   Food Insecurity:   . Worried About Charity fundraiser in the Last Year:   . Arboriculturist in the Last Year:   Transportation Needs:   . Film/video editor (Medical):   Marland Kitchen Lack of Transportation (Non-Medical):   Physical Activity:   . Days of Exercise per Week:   . Minutes of Exercise per Session:   Stress:   . Feeling of Stress :   Social Connections:   . Frequency of Communication with Friends and Family:   . Frequency of Social Gatherings with Friends and Family:    . Attends Religious Services:   . Active Member of Clubs or Organizations:   . Attends Archivist Meetings:   Marland Kitchen Marital Status:      Family History:  The patient's   family history includes Heart disease in his father.   ROS:   Please see the history of present illness.    ROS All other systems reviewed and are negative.   PHYSICAL EXAM:   VS:  BP 128/80   Pulse 72   Temp 98.5 F (36.9 C)   Ht 5\' 7"  (1.702 m)   Wt 206 lb (93.4 kg)   SpO2 96%   BMI 32.26 kg/m   Physical Exam  GEN: Well nourished, well developed, in no acute distress  Neck: no JVD, carotid bruits, or masses Cardiac:RRR; S4 no murmurs, rubs  Respiratory:  clear to auscultation bilaterally, normal work of breathing GI: soft, nontender, nondistended, + BS Ext: without cyanosis, clubbing, or edema, Good distal pulses bilaterally Neuro:  Alert and Oriented x 3 Psych: euthymic mood, full affect  Wt Readings from Last 3 Encounters:  06/05/19 206 lb (93.4 kg)  06/01/19 185 lb (83.9 kg)  08/23/18 196 lb (88.9 kg)      Studies/Labs Reviewed:   EKG:  EKG is not ordered today.  The ekg reviewed from 06/01/19 NSR, left axis no change Recent Labs: 07/25/2018: ALT 25 06/01/2019: BUN 16; Creatinine, Ser 1.03; Hemoglobin 15.5; Platelets 362; Potassium 4.3; Sodium 139   Lipid Panel No results found for: CHOL, TRIG, HDL, CHOLHDL, VLDL, LDLCALC, LDLDIRECT  Additional studies/ records that were reviewed today include:   CT 06/01/19 Fri Jun 01, 2019 1455 Ct chest didn't cross into epic yet -  IMPRESSION: 1. Normal contour and caliber of the thoracic and abdominal aorta without evidence of aneurysm, dissection, or other acute aortic syndrome. Mild atherosclerosis. Aortic Atherosclerosis (ICD10-I70.0).   2. Numerous enlarged mediastinal and bilateral hilar lymph nodes, of uncertain nature although concerning for malignancy given size, including metastatic disease or lymphoma. Non malignant  differential considerations include sarcoidosis. These may be characterized for metabolic activity by FDG PET.   3. Nonspecific 7 mm fissural pulmonary nodule of the left upper lobe. This nodule may be characterized for metabolic activity by PET-CT in conjunction with above.   4. Coronary artery disease.   5. Hepatic steatosis.   [MB]  Cardiac catheterization 08/23/2018  Mid RCA lesion is 20% stenosed.  Mid Cx lesion is 10% stenosed.  Mid LAD lesion is 30% stenosed.   1. Mild non-obstructive CAD 2. Normal filling pressures   Recommendations: Medical management of mild CAD   NST 08/17/2018  There was no ST segment deviation noted during stress.  Findings consistent with prior large inferior myocardial infarction with moderate peri-infarct ischemia and medium sized anterior infarct with moderate peri-infarct ischemia.  The left ventricular ejection fraction is normal (55-65%).  Moderate peri-infarct ischemia in two seperate territories would suggest high risk for major cardiac events.       ASSESSMENT:    1. Coronary artery disease involving native coronary artery of native heart without angina pectoris   2. Lymph node enlargement   3. Essential hypertension   4. Gastroesophageal reflux disease, unspecified whether esophagitis present      PLAN:  In order of problems listed above:  CAD mild nonobstructive on cardiac cath 08/23/2018 after abnormal NST, recent chest pain prompting ER visit 06/01/2019 relieved with Zofran and GI cocktail-EKG unchanged. Associated with elevated BP as well. No further cardiac work up at this time.   Enlarged mediastinal and bilateral hilar lymph nodes on CT scan concerning for possible malignancy as well as 7 mm fissure pulmonary nodule left upper lobe recommend PET scan.  Follow-up with oncology as scheduled for work-up  Hypertension-just started losartan yesterday-he'll monitor BP closely  GERD-only takes prn maalox. May benefit from  daily Protonix but will leave to PCP.  Medication Adjustments/Labs and Tests Ordered: Current medicines are reviewed at length with the patient today.  Concerns regarding medicines are outlined above.  Medication changes, Labs and Tests ordered today are listed in the Patient Instructions below. Patient Instructions  Medication Instructions:  Your physician recommends that you continue on your current medications as directed. Please refer to the Current Medication list given to you today.  *If you need a refill on your cardiac medications before your next appointment, please call your pharmacy*   Lab Work: NONE   If you have labs (blood work) drawn today and your tests are completely normal, you will receive your results only by: Marland Kitchen MyChart Message (if you have MyChart) OR . A paper copy in the mail If you have any lab test that is abnormal or we need to change your treatment, we will call you to review the results.   Testing/Procedures: NONE    Follow-Up: At [facility], you and your health needs are our priority.  As part of our continuing mission to provide you with exceptional heart care, we have created designated Provider Care Teams.  These Care Teams include your primary Cardiologist (physician) and Advanced Practice Providers (APPs -  Physician Assistants and Nurse Practitioners) who all work together to provide you with the care you need, when you need it.  We recommend signing up for the patient portal called "MyChart".  Sign up information is provided on this After Visit Summary.  MyChart is used to connect with patients for Virtual Visits (Telemedicine).  Patients are able to view lab/test results, encounter notes, upcoming appointments, etc.  Non-urgent messages can be sent to your provider as well.   To learn more about what you can do with MyChart, go to [email or web address].    Your next appointment:   6 month(s)  The format for your next appointment:   In  Person  Provider:   Jenkins Rouge, MD   Other Instructions Thank you for choosing [facility]!       Signed, Ermalinda Barrios, PA-C  06/05/2019 [facility] [place], [place], [place]  [address] Phone: [phone]; Fax: [phone]

## 2019-06-05 ENCOUNTER — Ambulatory Visit (INDEPENDENT_AMBULATORY_CARE_PROVIDER_SITE_OTHER): Payer: 59 | Admitting: Physician Assistant

## 2019-06-05 ENCOUNTER — Encounter: Payer: Self-pay | Admitting: Physician Assistant

## 2019-06-05 ENCOUNTER — Other Ambulatory Visit: Payer: Self-pay

## 2019-06-05 VITALS — BP 128/80 | HR 72 | Temp 98.5°F | Ht 67.0 in | Wt 206.0 lb

## 2019-06-05 DIAGNOSIS — R599 Enlarged lymph nodes, unspecified: Secondary | ICD-10-CM

## 2019-06-05 DIAGNOSIS — I10 Essential (primary) hypertension: Secondary | ICD-10-CM | POA: Diagnosis not present

## 2019-06-05 DIAGNOSIS — I2510 Atherosclerotic heart disease of native coronary artery without angina pectoris: Secondary | ICD-10-CM | POA: Diagnosis not present

## 2019-06-05 DIAGNOSIS — K219 Gastro-esophageal reflux disease without esophagitis: Secondary | ICD-10-CM | POA: Diagnosis not present

## 2019-06-05 NOTE — Patient Instructions (Signed)
Medication Instructions:  Your physician recommends that you continue on your current medications as directed. Please refer to the Current Medication list given to you today.  *If you need a refill on your cardiac medications before your next appointment, please call your pharmacy*   Lab Work: NONE  If you have labs (blood work) drawn today and your tests are completely normal, you will receive your results only by: MyChart Message (if you have MyChart) OR A paper copy in the mail If you have any lab test that is abnormal or we need to change your treatment, we will call you to review the results.   Testing/Procedures: NONE    Follow-Up: At [facility], you and your health needs are our priority.  As part of our continuing mission to provide you with exceptional heart care, we have created designated Provider Care Teams.  These Care Teams include your primary Cardiologist (physician) and Advanced Practice Providers (APPs -  Physician Assistants and Nurse Practitioners) who all work together to provide you with the care you need, when you need it.  We recommend signing up for the patient portal called "MyChart".  Sign up information is provided on this After Visit Summary.  MyChart is used to connect with patients for Virtual Visits (Telemedicine).  Patients are able to view lab/test results, encounter notes, upcoming appointments, etc.  Non-urgent messages can be sent to your provider as well.   To learn more about what you can do with MyChart, go to [email or web address].    Your next appointment:   6 month(s)  The format for your next appointment:   In Person  Provider:   Peter Nishan, MD   Other Instructions Thank you for choosing [facility]!    

## 2019-06-11 ENCOUNTER — Inpatient Hospital Stay (HOSPITAL_COMMUNITY): Payer: 59

## 2019-06-11 ENCOUNTER — Inpatient Hospital Stay (HOSPITAL_COMMUNITY): Payer: 59 | Attending: Hematology | Admitting: Hematology

## 2019-06-11 ENCOUNTER — Other Ambulatory Visit: Payer: Self-pay

## 2019-06-11 ENCOUNTER — Encounter (HOSPITAL_COMMUNITY): Payer: Self-pay | Admitting: Hematology

## 2019-06-11 VITALS — BP 130/72 | HR 71 | Temp 97.7°F | Resp 18 | Ht 67.0 in | Wt 206.0 lb

## 2019-06-11 DIAGNOSIS — R5383 Other fatigue: Secondary | ICD-10-CM | POA: Diagnosis not present

## 2019-06-11 DIAGNOSIS — R918 Other nonspecific abnormal finding of lung field: Secondary | ICD-10-CM | POA: Diagnosis not present

## 2019-06-11 DIAGNOSIS — I700 Atherosclerosis of aorta: Secondary | ICD-10-CM | POA: Insufficient documentation

## 2019-06-11 DIAGNOSIS — Z801 Family history of malignant neoplasm of trachea, bronchus and lung: Secondary | ICD-10-CM | POA: Insufficient documentation

## 2019-06-11 DIAGNOSIS — R519 Headache, unspecified: Secondary | ICD-10-CM | POA: Diagnosis not present

## 2019-06-11 DIAGNOSIS — R590 Localized enlarged lymph nodes: Secondary | ICD-10-CM | POA: Insufficient documentation

## 2019-06-11 DIAGNOSIS — J342 Deviated nasal septum: Secondary | ICD-10-CM

## 2019-06-11 DIAGNOSIS — R634 Abnormal weight loss: Secondary | ICD-10-CM | POA: Diagnosis not present

## 2019-06-11 DIAGNOSIS — R0602 Shortness of breath: Secondary | ICD-10-CM

## 2019-06-11 DIAGNOSIS — R42 Dizziness and giddiness: Secondary | ICD-10-CM | POA: Insufficient documentation

## 2019-06-11 DIAGNOSIS — R110 Nausea: Secondary | ICD-10-CM | POA: Diagnosis not present

## 2019-06-11 DIAGNOSIS — K760 Fatty (change of) liver, not elsewhere classified: Secondary | ICD-10-CM | POA: Diagnosis not present

## 2019-06-11 DIAGNOSIS — R911 Solitary pulmonary nodule: Secondary | ICD-10-CM | POA: Diagnosis not present

## 2019-06-11 DIAGNOSIS — R079 Chest pain, unspecified: Secondary | ICD-10-CM | POA: Diagnosis not present

## 2019-06-11 DIAGNOSIS — I2510 Atherosclerotic heart disease of native coronary artery without angina pectoris: Secondary | ICD-10-CM | POA: Diagnosis not present

## 2019-06-11 DIAGNOSIS — Z8249 Family history of ischemic heart disease and other diseases of the circulatory system: Secondary | ICD-10-CM

## 2019-06-11 DIAGNOSIS — Z803 Family history of malignant neoplasm of breast: Secondary | ICD-10-CM | POA: Diagnosis not present

## 2019-06-11 DIAGNOSIS — Z79899 Other long term (current) drug therapy: Secondary | ICD-10-CM | POA: Insufficient documentation

## 2019-06-11 LAB — COMPREHENSIVE METABOLIC PANEL
ALT: 25 U/L (ref 0–44)
AST: 19 U/L (ref 15–41)
Albumin: 4.6 g/dL (ref 3.5–5.0)
Alkaline Phosphatase: 70 U/L (ref 38–126)
Anion gap: 9 (ref 5–15)
BUN: 16 mg/dL (ref 6–20)
CO2: 28 mmol/L (ref 22–32)
Calcium: 9.4 mg/dL (ref 8.9–10.3)
Chloride: 102 mmol/L (ref 98–111)
Creatinine, Ser: 0.99 mg/dL (ref 0.61–1.24)
GFR calc Af Amer: >60 mL/min (ref >60)
GFR calc non Af Amer: >60 mL/min (ref >60)
Glucose, Bld: 99 mg/dL (ref 70–99)
Potassium: 4.1 mmol/L (ref 3.5–5.1)
Sodium: 139 mmol/L (ref 135–145)
Total Bilirubin: 0.8 mg/dL (ref 0.3–1.2)
Total Protein: 7.3 g/dL (ref 6.5–8.1)

## 2019-06-11 LAB — CBC WITH DIFFERENTIAL/PLATELET
Abs Immature Granulocytes: 0.01 10*3/uL (ref 0.00–0.07)
Basophils Absolute: 0.1 10*3/uL (ref 0.0–0.1)
Basophils Relative: 1 %
Eosinophils Absolute: 0.1 10*3/uL (ref 0.0–0.5)
Eosinophils Relative: 3 %
HCT: 46.2 % (ref 39.0–52.0)
Hemoglobin: 15.2 g/dL (ref 13.0–17.0)
Immature Granulocytes: 0 %
Lymphocytes Relative: 32 %
Lymphs Abs: 1.2 10*3/uL (ref 0.7–4.0)
MCH: 29.2 pg (ref 26.0–34.0)
MCHC: 32.9 g/dL (ref 30.0–36.0)
MCV: 88.7 fL (ref 80.0–100.0)
Monocytes Absolute: 0.3 10*3/uL (ref 0.1–1.0)
Monocytes Relative: 7 %
Neutro Abs: 2.2 10*3/uL (ref 1.7–7.7)
Neutrophils Relative %: 57 %
Platelets: 322 10*3/uL (ref 150–400)
RBC: 5.21 MIL/uL (ref 4.22–5.81)
RDW: 12.7 % (ref 11.5–15.5)
WBC: 3.9 10*3/uL — ABNORMAL LOW (ref 4.0–10.5)
nRBC: 0 % (ref 0.0–0.2)

## 2019-06-11 LAB — SEDIMENTATION RATE: Sed Rate: 1 mm/hr (ref 0–16)

## 2019-06-11 LAB — LACTATE DEHYDROGENASE: LDH: 120 U/L (ref 98–192)

## 2019-06-11 LAB — C-REACTIVE PROTEIN: CRP: 0.5 mg/dL (ref <1.0)

## 2019-06-11 NOTE — Assessment & Plan Note (Addendum)
1.  Mediastinal and hilar lymphadenopathy: -Went to the ER on 06/01/2019 with headache and severe chest pain. -CT angiogram of the chest showed numerous enlarged mediastinal and bilateral hilar lymph nodes, largest pretracheal lymph node measuring 2.4 x 2.4 cm.  There is a nonspecific 7 mm fissural nodule in the left upper lobe.  Hepatic steatosis was seen.  Spleen was normal in size.  No abdominal or pelvic adenopathy. -Denies any fevers or night sweats.  14 pound weight loss in the last 1 and half month, trying to lose weight by dietary modifications. -Colonoscopy was 1 year ago and was reportedly clean, done in [place]. -Works in [employer].  No specific chemical exposure.  No tobacco use. -I have reviewed the images with the patient and his wife.  We discussed the differential diagnosis which includes lymphoma and benign causes like sarcoidosis. -I will check LDH level, ESR, CRP, ACE level. -I have recommended PET CT scan for choosing lymph node area for biopsy.  2.  Family history: -Mother died of breast cancer.  Father had lung cancer.  Maternal aunt had breast cancer. -No family history of sarcoidosis.

## 2019-06-11 NOTE — Progress Notes (Signed)
[facility] NOTE  Patient Care Team: Sasser, Silvestre Moment, MD as PCP - General (Family Medicine) Josue Hector, MD as PCP - Cardiology (Cardiology)  CHIEF COMPLAINTS/PURPOSE OF CONSULTATION:  Mediastinal and hilar lymphadenopathy.  HISTORY OF PRESENTING ILLNESS:  Mathew Moore 54 y.o. male is seen in consultation today at the request of Dr. Quintin Alto for further work-up and management of enlarged mediastinal and hilar lymph nodes.  Patient recently had vertigo.  He developed headache and chest pain on 06/01/2019 and presented to the ER.  CT angiogram of the chest, abdomen and pelvis for dissection was done.  It showed numerous enlarged mediastinal and bilateral hilar lymph nodes, largest measuring 2.4 cm in the pretracheal region.  Patient is a non-smoker.  Hepatic steatosis seen.  Spleen was normal.  No other adenopathy in the abdomen or pelvis.  Nonspecific 7 mm fissural pulmonary nodule of the left upper lobe.  He reports a weight loss of 14 pounds in the last 1 and half month but was trying to lose weight by dietary modification.  Denies any fevers or night sweats.  Reports improvement in the chest pain after his visit to the ER.  Had colonoscopy a year ago and was reportedly told that it was clear.  He is able to do all his activities.  He works at [employer].  Denies any significant chemical exposure.  No new onset pains reported.  Family history significant for mother who died of breast cancer.  Father had lung cancer.  Maternal aunt had breast cancer.  No family history of sarcoidosis.  No new onset cough or shortness of breath on exertion reported.  Appetite is 50%.  Energy levels are 75%.  Dizziness from vertigo present.  Reported occasional nausea.  MEDICAL HISTORY:  Past Medical History:  Diagnosis Date  . Hypertension     SURGICAL HISTORY: Past Surgical History:  Procedure Laterality Date  . HERNIA REPAIR    . LEFT HEART CATH AND CORONARY  ANGIOGRAPHY N/A 08/23/2018   Procedure: LEFT HEART CATH AND CORONARY ANGIOGRAPHY;  Surgeon: Burnell Blanks, MD;  Location: [facility];  Service: Cardiovascular;  Laterality: N/A;    SOCIAL HISTORY: Social History   Socioeconomic History  . Marital status: Married    Spouse name: [name]  . Number of children: 2  . Years of education: Not on file  . Highest education level: Not on file  Occupational History  . Not on file  Tobacco Use  . Smoking status: Never Smoker  . Smokeless tobacco: Never Used  Substance and Sexual Activity  . Alcohol use: Not Currently  . Drug use: Not Currently  . Sexual activity: Not on file  Other Topics Concern  . Not on file  Social History Narrative  . Not on file   Social Determinants of Health   Financial Resource Strain: Low Risk   . Difficulty of Paying Living Expenses: Not hard at all  Food Insecurity: No Food Insecurity  . Worried About Charity fundraiser in the Last Year: Never true  . Ran Out of Food in the Last Year: Never true  Transportation Needs: No Transportation Needs  . Lack of Transportation (Medical): No  . Lack of Transportation (Non-Medical): No  Physical Activity: Sufficiently Active  . Days of Exercise per Week: 4 days  . Minutes of Exercise per Session: 40 min  Stress: No Stress Concern Present  . Feeling of Stress : Not at all  Social Connections: Slightly Isolated  . Frequency of Communication with Friends and Family: Three times a week  . Frequency of Social Gatherings with Friends and Family: Three times a week  . Attends Religious Services: More than 4 times per year  . Active Member of Clubs or Organizations: No  . Attends Archivist Meetings: Never  . Marital Status: Married  Human resources officer Violence: Not At Risk  . Fear of Current or Ex-Partner: No  . Emotionally Abused: No  . Physically Abused: No  . Sexually Abused: No    FAMILY HISTORY: Family History  Problem Relation  Age of Onset  . Breast cancer Mother   . Heart disease Father   . Lung cancer Father     ALLERGIES:  has No Known Allergies.  MEDICATIONS:  Current Outpatient Medications  Medication Sig Dispense Refill  . losartan (COZAAR) 25 MG tablet Take 25 mg by mouth daily.    Marland Kitchen zolpidem (AMBIEN) 10 MG tablet Take 1 tablet by mouth at bedtime.    Marland Kitchen acetaminophen (TYLENOL) 500 MG tablet Take 1,000 mg by mouth every 6 (six) hours as needed for moderate pain or headache.    Marland Kitchen alum & mag hydroxide-simeth (MAALOX/MYLANTA) 200-200-20 MG/5ML suspension Take 15 mLs by mouth as needed for indigestion or heartburn.    . calcium carbonate (TUMS - DOSED IN MG ELEMENTAL CALCIUM) 500 MG chewable tablet Chew 2 tablets by mouth every 4 (four) hours as needed for indigestion or heartburn.    . meclizine (ANTIVERT) 25 MG tablet Take 25 mg by mouth 3 (three) times daily as needed for dizziness.     Current Facility-Administered Medications  Medication Dose Route Frequency Provider Last Rate Last Admin  . sodium chloride flush (NS) 0.9 % injection 3 mL  3 mL Intravenous Q12H Josue Hector, MD        REVIEW OF SYSTEMS:   Constitutional: Denies fevers, chills or abnormal night sweats Eyes: Denies blurriness of vision, double vision or watery eyes Ears, nose, mouth, throat, and face: Denies mucositis or sore throat Respiratory: Denies cough, dyspnea or wheezes Cardiovascular: Denies palpitation, chest discomfort or lower extremity swelling Gastrointestinal:  Denies nausea, heartburn or change in bowel habits Skin: Denies abnormal skin rashes Lymphatics: Denies new lymphadenopathy or easy bruising Neurological:Denies numbness, tingling or new weaknesses.  Positive for dizziness and occasional headaches. Behavioral/Psych: Mood is stable, no new changes  All other systems were reviewed with the patient and are negative.  PHYSICAL EXAMINATION: ECOG PERFORMANCE STATUS: 0 - Asymptomatic  Vitals:   06/11/19 0817   BP: 130/72  Pulse: 71  Resp: 18  Temp: 97.7 F (36.5 C)   Filed Weights   06/11/19 0817  Weight: 206 lb (93.4 kg)    GENERAL:alert, no distress and comfortable SKIN: skin color, texture, turgor are normal, no rashes or significant lesions EYES: normal, conjunctiva are pink and non-injected, sclera clear OROPHARYNX:no exudate, no erythema and lips, buccal mucosa, and tongue normal  NECK: supple, thyroid normal size, non-tender, without nodularity LYMPH:  no palpable lymphadenopathy in the cervical, axillary or inguinal LUNGS: clear to auscultation and percussion with normal breathing effort HEART: regular rate & rhythm and no murmurs and no lower extremity edema ABDOMEN:abdomen soft, non-tender and normal bowel sounds Musculoskeletal:no cyanosis of digits and no clubbing  PSYCH: alert & oriented x 3 with fluent speech NEURO: no focal motor/sensory deficits  LABORATORY DATA:  I have reviewed the data as listed Lab Results  Component Value Date  WBC 3.9 (L) 06/11/2019   HGB 15.2 06/11/2019   HCT 46.2 06/11/2019   MCV 88.7 06/11/2019   PLT 322 06/11/2019     Chemistry      Component Value Date/Time   NA 139 06/11/2019 0929   K 4.1 06/11/2019 0929   CL 102 06/11/2019 0929   CO2 28 06/11/2019 0929   BUN 16 06/11/2019 0929   CREATININE 0.99 06/11/2019 0929      Component Value Date/Time   CALCIUM 9.4 06/11/2019 0929   ALKPHOS 70 06/11/2019 0929   AST 19 06/11/2019 0929   ALT 25 06/11/2019 0929   BILITOT 0.8 06/11/2019 0929       RADIOGRAPHIC STUDIES: I have personally reviewed the radiological images as listed and agreed with the findings in the report. CT Head Wo Contrast  Result Date: 06/01/2019 CLINICAL DATA:  Headache and dizziness EXAM: CT HEAD WITHOUT CONTRAST TECHNIQUE: Contiguous axial images were obtained from the base of the skull through the vertex without intravenous contrast. COMPARISON:  None. FINDINGS: Brain: Ventricles and sulci are normal in size  and configuration. There is no intracranial mass, hemorrhage, extra-axial fluid collection, or midline shift. The brain parenchyma appears unremarkable. No acute infarct evident. Vascular: No hyperdense vessel.  No evident vascular calcification. Skull: Bony calvarium appears intact. Sinuses/Orbits: There is mild mucosal thickening in the left maxillary antrum. There is mucosal thickening in several ethmoid air cells. There is rightward deviation of the nasal septum. Orbits appear symmetric bilaterally. Other: Mastoid air cells are clear. IMPRESSION: Brain parenchyma unremarkable.  No mass or hemorrhage. There are foci of paranasal sinus disease. There is rightward deviation of the nasal septum. Electronically Signed   By: Lowella Grip III M.D.   On: 06/01/2019 13:55   DG Chest Port 1 View  Result Date: 06/01/2019 CLINICAL DATA:  Chest pain and shortness of breath EXAM: PORTABLE CHEST 1 VIEW COMPARISON:  July 25, 2018 FINDINGS: Lungs are clear. Heart is mildly enlarged with pulmonary vascularity normal. No adenopathy. No pneumothorax. No bone lesions. IMPRESSION: Mild cardiomegaly.  Lungs clear. Electronically Signed   By: Lowella Grip III M.D.   On: 06/01/2019 12:33   CT Angio Chest/Abd/Pel for Dissection W and/or W/WO  Result Date: 06/01/2019 CLINICAL DATA:  Chest pain, acute aortic syndrome suspected EXAM: CT ANGIOGRAPHY CHEST, ABDOMEN AND PELVIS TECHNIQUE: Non-contrast CT of the chest was initially obtained. Multidetector CT imaging through the chest, abdomen and pelvis was performed using the standard protocol during bolus administration of intravenous contrast. Multiplanar reconstructed images and MIPs were obtained and reviewed to evaluate the vascular anatomy. CONTRAST:  112m OMNIPAQUE IOHEXOL 350 MG/ML SOLN COMPARISON:  None. FINDINGS: CTA CHEST FINDINGS Cardiovascular: Preferential opacification of the thoracic aorta. Normal contour and caliber of the thoracic aorta. No evidence of  aneurysm, dissection, or other acute aortic syndrome. Scattered aortic atherosclerosis. Normal heart size. Three-vessel coronary artery calcifications. No pericardial effusion. Mediastinum/Nodes: There are numerous enlarged mediastinal and bilateral hilar lymph nodes, largest pretracheal lymph nodes measuring up to 2.4 x 2.4 cm (series 5, image 35). Thyroid gland, trachea, and esophagus demonstrate no significant findings. Lungs/Pleura: Lungs are clear. There is a nonspecific 7 mm fissural nodule of the left upper lobe (series 7, image 46). No pleural effusion or pneumothorax. Musculoskeletal: No chest wall abnormality. No acute or significant osseous findings. Review of the MIP images confirms the above findings. CTA ABDOMEN AND PELVIS FINDINGS VASCULAR Normal contour and caliber of the abdominal aorta. No evidence of aneurysm, dissection, or other  acute aortic syndrome. Standard branching pattern of the abdominal aorta, with solitary bilateral renal arteries. Mild mixed calcific atherosclerosis. Review of the MIP images confirms the above findings. NON-VASCULAR Hepatobiliary: No solid liver abnormality is seen. Hepatic steatosis. No gallstones, gallbladder wall thickening, or biliary dilatation. Pancreas: Unremarkable. No pancreatic ductal dilatation or surrounding inflammatory changes. Spleen: Normal in size without significant abnormality. Adrenals/Urinary Tract: Adrenal glands are unremarkable. Kidneys are normal, without renal calculi, solid lesion, or hydronephrosis. Bladder is unremarkable. Stomach/Bowel: Stomach is within normal limits. Appendix appears normal. There is hazy fat stranding of the central small bowel mesentery in the left hemiabdomen with prominent although not pathologically enlarged lymph nodes (series 5, image 131). Lymphatic: No enlarged abdominal or pelvic lymph nodes. Reproductive: No mass or other significant abnormality. Other: No abdominal wall hernia or abnormality. No  abdominopelvic ascites. Musculoskeletal: No acute or significant osseous findings. Review of the MIP images confirms the above findings. IMPRESSION: 1. Normal contour and caliber of the thoracic and abdominal aorta without evidence of aneurysm, dissection, or other acute aortic syndrome. Mild atherosclerosis. Aortic Atherosclerosis (ICD10-I70.0). 2. Numerous enlarged mediastinal and bilateral hilar lymph nodes, of uncertain nature although concerning for malignancy given size, including metastatic disease or lymphoma. Non malignant differential considerations include sarcoidosis. These may be characterized for metabolic activity by FDG PET. 3. Nonspecific 7 mm fissural pulmonary nodule of the left upper lobe. This nodule may be characterized for metabolic activity by PET-CT in conjunction with above. 4.  Coronary artery disease. 5.  Hepatic steatosis. Electronically Signed   By: Eddie Candle M.D.   On: 06/01/2019 14:08    ASSESSMENT & PLAN:  Lymphadenopathy, mediastinal 1.  Mediastinal and hilar lymphadenopathy: -Went to the ER on 06/01/2019 with headache and severe chest pain. -CT angiogram of the chest showed numerous enlarged mediastinal and bilateral hilar lymph nodes, largest pretracheal lymph node measuring 2.4 x 2.4 cm.  There is a nonspecific 7 mm fissural nodule in the left upper lobe.  Hepatic steatosis was seen.  Spleen was normal in size.  No abdominal or pelvic adenopathy. -Denies any fevers or night sweats.  14 pound weight loss in the last 1 and half month, trying to lose weight by dietary modifications. -Colonoscopy was 1 year ago and was reportedly clean, done in [place]. -Works in [employer].  No specific chemical exposure.  No tobacco use. -I have reviewed the images with the patient and his wife.  We discussed the differential diagnosis which includes lymphoma and benign causes like sarcoidosis. -I will check LDH level, ESR, CRP, ACE level. -I have recommended PET  CT scan for choosing lymph node area for biopsy.  2.  Family history: -Mother died of breast cancer.  Father had lung cancer.  Maternal aunt had breast cancer. -No family history of sarcoidosis.  Orders Placed This Encounter  Procedures  . NM PET Image Initial (PI) Skull Base To Thigh    Standing Status:   Future    Standing Expiration Date:   06/10/2020    Order Specific Question:   ** REASON FOR EXAM (FREE TEXT)    Answer:   mediastinal lymphadenopathy    Order Specific Question:   If indicated for the ordered procedure, I authorize the administration of a radiopharmaceutical per Radiology protocol    Answer:   Yes    Order Specific Question:   Preferred imaging location?    Answer:   [facility]    Order Specific Question:   Radiology Contrast  Protocol - do NOT remove file path    Answer:   [email or web address]  . Lactate dehydrogenase    Standing Status:   Future    Number of Occurrences:   1    Standing Expiration Date:   06/10/2020  . Comprehensive metabolic panel    Standing Status:   Future    Number of Occurrences:   1    Standing Expiration Date:   06/10/2020  . CBC with Differential    Standing Status:   Future    Number of Occurrences:   1    Standing Expiration Date:   06/10/2020  . Angiotensin converting enzyme    Standing Status:   Future    Number of Occurrences:   1    Standing Expiration Date:   06/10/2020  . Sedimentation rate    Standing Status:   Future    Number of Occurrences:   1    Standing Expiration Date:   06/10/2020  . C-reactive protein    Standing Status:   Future    Number of Occurrences:   1    Standing Expiration Date:   06/10/2020    All questions were answered. The patient knows to call the clinic with any problems, questions or concerns.      Derek Jack, MD 06/11/2019 5:10 PM

## 2019-06-12 LAB — ANGIOTENSIN CONVERTING ENZYME: Angiotensin-Converting Enzyme: 71 U/L (ref 14–82)

## 2019-06-20 ENCOUNTER — Other Ambulatory Visit: Payer: Self-pay

## 2019-06-20 ENCOUNTER — Ambulatory Visit (HOSPITAL_COMMUNITY)
Admission: RE | Admit: 2019-06-20 | Discharge: 2019-06-20 | Disposition: A | Discharge status: Home / Self Care | Payer: 59 | Source: Ambulatory Visit | Attending: Hematology | Admitting: Hematology

## 2019-06-20 DIAGNOSIS — R590 Localized enlarged lymph nodes: Secondary | ICD-10-CM | POA: Insufficient documentation

## 2019-06-20 LAB — GLUCOSE, CAPILLARY: Glucose-Capillary: 97 mg/dL (ref 70–99)

## 2019-06-20 MED ORDER — FLUDEOXYGLUCOSE F - 18 (FDG) INJECTION: 10.2000 | Freq: Once | INTRAVENOUS | Status: DC | PRN

## 2019-06-20 NOTE — Progress Notes (Signed)
[facility], [place] [id number]   CLINIC:  Medical Oncology/Hematology  PCP:  Mathew Hilding, MD [address] [place] [address] [phone]   REASON FOR VISIT:  Follow-up for Mediastinal and hilar lymphadenopathy.  CURRENT THERAPY: Under work-up  BRIEF ONCOLOGIC HISTORY:  Oncology History   No history exists.    CANCER STAGING: Cancer Staging No matching staging information was found for the patient.  INTERVAL HISTORY:  [name], a 54 y.o. male, returns for routine follow-up of his Mediastinal and hilar lymphadenopathy.Mathew Moore was last seen on 06/11/2019.  Denies any B symptoms.  He noted that he was currently out on FMLA for his blood pressure, but that he was supposed to go back to work on Monday night. He would like to get additional FMLA filed depending on his diagnosis.    REVIEW OF SYSTEMS:  Review of Systems  Constitutional: Positive for fatigue (fatigue). Negative for appetite change, chills and fever.  HENT:   Negative for lump/mass, mouth sores, sore throat and trouble swallowing.   Eyes: Negative for eye problems.  Respiratory: Negative for chest tightness, cough, shortness of breath and wheezing.   Cardiovascular: Negative for chest pain and palpitations.  Gastrointestinal: Negative for abdominal pain, constipation, diarrhea, nausea and vomiting.  Genitourinary: Negative for bladder incontinence, dysuria, frequency and hematuria.   Musculoskeletal: Negative for arthralgias, back pain, flank pain and myalgias.  Skin: Negative for rash.  Neurological: Negative for dizziness, headaches, light-headedness and numbness.  Hematological: Does not bruise/bleed easily.  Psychiatric/Behavioral: Negative for depression. The patient is not nervous/anxious.     PAST MEDICAL/SURGICAL HISTORY:  Past Medical History:  Diagnosis Date  . Hypertension    Past Surgical History:  Procedure Laterality Date  . HERNIA  REPAIR    . LEFT HEART CATH AND CORONARY ANGIOGRAPHY N/A 08/23/2018   Procedure: LEFT HEART CATH AND CORONARY ANGIOGRAPHY;  Surgeon: Mathew Blanks, MD;  [facility]: [facility];  Service: Cardiovascular;  Laterality: N/A;    SOCIAL HISTORY:  Social History   Socioeconomic History  . Marital status: Married    Spouse name: Mathew Moore  . Number of children: 2  . Years of education: Not on file  . Highest education level: Not on file  Occupational History  . Not on file  Tobacco Use  . Smoking status: Never Smoker  . Smokeless tobacco: Never Used  Substance and Sexual Activity  . Alcohol use: Not Currently  . Drug use: Not Currently  . Sexual activity: Not on file  Other Topics Concern  . Not on file  Social History Narrative  . Not on file   Social Determinants of Health   Financial Resource Strain: Low Risk   . Difficulty of Paying Living Expenses: Not hard at all  Food Insecurity: No Food Insecurity  . Worried About Charity fundraiser in the Last Year: Never true  . Ran Out of Food in the Last Year: Never true  Transportation Needs: No Transportation Needs  . Lack of Transportation (Medical): No  . Lack of Transportation (Non-Medical): No  Physical Activity: Sufficiently Active  . Days of Exercise per Week: 4 days  . Minutes of Exercise per Session: 40 min  Stress: No Stress Concern Present  . Feeling of Stress : Not at all  Social Connections: Slightly Isolated  . Frequency of Communication with Friends and Family: Three times a week  . Frequency of Social Gatherings with  Friends and Family: Three times a week  . Attends Religious Services: More than 4 times per year  . Active Member of Clubs or Organizations: No  . Attends Archivist Meetings: Never  . Marital Status: Married  Human resources officer Violence: Not At Risk  . Fear of Current or Ex-Partner: No  . Emotionally Abused: No  . Physically Abused: No  . Sexually Abused: No    FAMILY  HISTORY:  Family History  Problem Relation Age of Onset  . Breast cancer Mother   . Heart disease Father   . Lung cancer Father     CURRENT MEDICATIONS:  Current Outpatient Medications  Medication Sig Dispense Refill  . losartan (COZAAR) 25 MG tablet Take 25 mg by mouth daily.    Marland Kitchen zolpidem (AMBIEN) 10 MG tablet Take 1 tablet by mouth at bedtime.    Marland Kitchen acetaminophen (TYLENOL) 500 MG tablet Take 1,000 mg by mouth every 6 (six) hours as needed for moderate pain or headache.    Marland Kitchen alum & mag hydroxide-simeth (MAALOX/MYLANTA) 200-200-20 MG/5ML suspension Take 15 mLs by mouth as needed for indigestion or heartburn.    . calcium carbonate (TUMS - DOSED IN MG ELEMENTAL CALCIUM) 500 MG chewable tablet Chew 2 tablets by mouth every 4 (four) hours as needed for indigestion or heartburn.    . meclizine (ANTIVERT) 25 MG tablet Take 25 mg by mouth 3 (three) times daily as needed for dizziness.     Current Facility-Administered Medications  Medication Dose Route Frequency Provider Last Rate Last Admin  . sodium chloride flush (NS) 0.9 % injection 3 mL  3 mL Intravenous Q12H Mathew Hector, MD       Facility-Administered Medications Ordered in Other Visits  Medication Dose Route Frequency Provider Last Rate Last Admin  . fludeoxyglucose F - 18 (FDG) injection 19.5 millicurie  09.3 millicurie Intravenous Once PRN [facility] T, DO        ALLERGIES:  No Known Allergies  PHYSICAL EXAM:  Performance status (ECOG): 0 - Asymptomatic  Vitals:   06/21/19 0858  BP: 133/84  Pulse: 81  Resp: 18  Temp: 97.7 F (36.5 C)  SpO2: 100%   Wt Readings from Last 3 Encounters:  06/21/19 209 lb (94.8 kg)  06/11/19 206 lb (93.4 kg)  06/05/19 206 lb (93.4 kg)   Physical Exam Constitutional:      Appearance: Normal appearance.  HENT:     Nose: No congestion.     Mouth/Throat:     Mouth: Mucous membranes are moist.  Eyes:     Extraocular Movements: Extraocular movements intact.     Pupils: Pupils are  equal, round, and reactive to light.  Cardiovascular:     Rate and Rhythm: Normal rate and regular rhythm.     Heart sounds: No murmur. No gallop.   Pulmonary:     Breath sounds: No wheezing, rhonchi or rales.  Abdominal:     Tenderness: There is no abdominal tenderness.  Musculoskeletal:        General: No tenderness.     Cervical back: Normal range of motion. No tenderness.     Right lower leg: No edema.     Left lower leg: No edema.  Skin:    General: Skin is warm and dry.     Findings: No bruising, erythema or rash.  Neurological:     Mental Status: He is alert and oriented to person, place, and time.     Sensory: No sensory deficit.  Motor: No weakness.  Psychiatric:        Mood and Affect: Mood normal.        Behavior: Behavior normal.        Thought Content: Thought content normal.        Judgment: Judgment normal.      LABORATORY DATA:  I have reviewed the labs as listed.  CBC Latest Ref Rng & Units 06/11/2019 06/01/2019 08/21/2018  WBC 4.0 - 10.5 K/uL 3.9(L) 4.8 4.8  Hemoglobin 13.0 - 17.0 g/dL 15.2 15.5 15.6  Hematocrit 39.0 - 52.0 % 46.2 46.8 46.4  Platelets 150 - 400 K/uL 322 362 309   CMP Latest Ref Rng & Units 06/11/2019 06/01/2019 08/21/2018  Glucose 70 - 99 mg/dL 99 100(H) 100(H)  BUN 6 - 20 mg/dL '16 16 12  '$ Creatinine 0.61 - 1.24 mg/dL 0.99 1.03 0.90  Sodium 135 - 145 mmol/L 139 139 139  Potassium 3.5 - 5.1 mmol/L 4.1 4.3 3.9  Chloride 98 - 111 mmol/L 102 104 105  CO2 22 - 32 mmol/L '28 25 27  '$ Calcium 8.9 - 10.3 mg/dL 9.4 9.2 9.3  Total Protein 6.5 - 8.1 g/dL 7.3 - -  Total Bilirubin 0.3 - 1.2 mg/dL 0.8 - -  Alkaline Phos 38 - 126 U/L 70 - -  AST 15 - 41 U/L 19 - -  ALT 0 - 44 U/L 25 - -   [facility] Results  Component Value Date   LDH 120 06/11/2019   DDIMER <0.27 06/01/2019    DIAGNOSTIC IMAGING:  I have independently reviewed the scans and discussed with the patient. CT Head Wo Contrast  Result Date: 06/01/2019 CLINICAL DATA:  Headache and  dizziness EXAM: CT HEAD WITHOUT CONTRAST TECHNIQUE: Contiguous axial images were obtained from the base of the skull through the vertex without intravenous contrast. COMPARISON:  None. FINDINGS: Brain: Ventricles and sulci are normal in size and configuration. There is no intracranial mass, hemorrhage, extra-axial fluid collection, or midline shift. The brain parenchyma appears unremarkable. No acute infarct evident. Vascular: No hyperdense vessel.  No evident vascular calcification. Skull: Bony calvarium appears intact. Sinuses/Orbits: There is mild mucosal thickening in the left maxillary antrum. There is mucosal thickening in several ethmoid air cells. There is rightward deviation of the nasal septum. Orbits appear symmetric bilaterally. Other: Mastoid air cells are clear. IMPRESSION: Brain parenchyma unremarkable.  No mass or hemorrhage. There are foci of paranasal sinus disease. There is rightward deviation of the nasal septum. Electronically Signed   By: Lowella Grip III M.D.   On: 06/01/2019 13:55   NM PET Image Initial (PI) Skull Base To Thigh  Result Date: 06/20/2019 CLINICAL DATA:  Initial treatment strategy for mediastinal lymphadenopathy. EXAM: NUCLEAR MEDICINE PET SKULL BASE TO THIGH TECHNIQUE: 10.2 mCi F-18 FDG was injected intravenously. Full-ring PET imaging was performed from the skull base to thigh after the radiotracer. CT data was obtained and used for attenuation correction and anatomic localization. Fasting blood glucose: 97 mg/dl COMPARISON:  Insert CT a chest 06/01/2019 FINDINGS: Mediastinal blood pool activity: SUV max 2.0 Liver activity: SUV max NA NECK: No hypermetabolic lymph nodes in the neck. Incidental CT findings: none CHEST: Mediastinal and hilar lymphadenopathy is markedly hypermetabolic. 2.2 cm right paratracheal node on 71/4 shows SUV max = 10.3. 2.4 cm short axis subcarinal node has SUV max = 14. Hypermetabolic left hilar lymphadenopathy demonstrates SUV max = 15.4 with  hypermetabolic disease in the right hilum demonstrating SUV max = 15.6. 7 mm posterior left  upper lobe perifissural nodule (image 19/series 8) is hypermetabolic with SUV max = 4.6 Ill-defined posterior right lower lobe nodular opacity measuring 1.8 cm (39/8) shows low level FDG accumulation and may be atelectatic or infectious/inflammatory. Incidental CT findings: Heart size upper normal to mildly increased. Coronary artery calcification is evident. Atherosclerotic calcification is noted in the wall of the thoracic aorta. ABDOMEN/PELVIS: No abnormal hypermetabolic activity within the liver, pancreas, adrenal glands, or spleen. No hypermetabolic lymph nodes in the abdomen or pelvis. Incidental CT findings: none SKELETON: No focal hypermetabolic activity to suggest skeletal metastasis. Incidental CT findings: none IMPRESSION: 1. Markedly hypermetabolic mediastinal and bilateral hilar lymphadenopathy. Imaging features consistent with neoplasm. Metastatic disease and lymphoma primary diagnostic considerations. 2. 7 mm hypermetabolic posterior left upper lobe pulmonary nodule. This could be metastatic disease or primary bronchogenic neoplasm. 3. No evidence for unexpected hypermetabolic disease in the neck, abdomen, or pelvis. Electronically Signed   By: Misty Stanley M.D.   On: 06/20/2019 09:19   DG Chest Port 1 View  Result Date: 06/01/2019 CLINICAL DATA:  Chest pain and shortness of breath EXAM: PORTABLE CHEST 1 VIEW COMPARISON:  July 25, 2018 FINDINGS: Lungs are clear. Heart is mildly enlarged with pulmonary vascularity normal. No adenopathy. No pneumothorax. No bone lesions. IMPRESSION: Mild cardiomegaly.  Lungs clear. Electronically Signed   By: Lowella Grip III M.D.   On: 06/01/2019 12:33   CT Angio Chest/Abd/Pel for Dissection W and/or W/WO  Result Date: 06/01/2019 CLINICAL DATA:  Chest pain, acute aortic syndrome suspected EXAM: CT ANGIOGRAPHY CHEST, ABDOMEN AND PELVIS TECHNIQUE: Non-contrast CT of  the chest was initially obtained. Multidetector CT imaging through the chest, abdomen and pelvis was performed using the standard protocol during bolus administration of intravenous contrast. Multiplanar reconstructed images and MIPs were obtained and reviewed to evaluate the vascular anatomy. CONTRAST:  150m OMNIPAQUE IOHEXOL 350 MG/ML SOLN COMPARISON:  None. FINDINGS: CTA CHEST FINDINGS Cardiovascular: Preferential opacification of the thoracic aorta. Normal contour and caliber of the thoracic aorta. No evidence of aneurysm, dissection, or other acute aortic syndrome. Scattered aortic atherosclerosis. Normal heart size. Three-vessel coronary artery calcifications. No pericardial effusion. Mediastinum/Nodes: There are numerous enlarged mediastinal and bilateral hilar lymph nodes, largest pretracheal lymph nodes measuring up to 2.4 x 2.4 cm (series 5, image 35). Thyroid gland, trachea, and esophagus demonstrate no significant findings. Lungs/Pleura: Lungs are clear. There is a nonspecific 7 mm fissural nodule of the left upper lobe (series 7, image 46). No pleural effusion or pneumothorax. Musculoskeletal: No chest wall abnormality. No acute or significant osseous findings. Review of the MIP images confirms the above findings. CTA ABDOMEN AND PELVIS FINDINGS VASCULAR Normal contour and caliber of the abdominal aorta. No evidence of aneurysm, dissection, or other acute aortic syndrome. Standard branching pattern of the abdominal aorta, with solitary bilateral renal arteries. Mild mixed calcific atherosclerosis. Review of the MIP images confirms the above findings. NON-VASCULAR Hepatobiliary: No solid liver abnormality is seen. Hepatic steatosis. No gallstones, gallbladder wall thickening, or biliary dilatation. Pancreas: Unremarkable. No pancreatic ductal dilatation or surrounding inflammatory changes. Spleen: Normal in size without significant abnormality. Adrenals/Urinary Tract: Adrenal glands are unremarkable.  Kidneys are normal, without renal calculi, solid lesion, or hydronephrosis. Bladder is unremarkable. Stomach/Bowel: Stomach is within normal limits. Appendix appears normal. There is hazy fat stranding of the central small bowel mesentery in the left hemiabdomen with prominent although not pathologically enlarged lymph nodes (series 5, image 131). Lymphatic: No enlarged abdominal or pelvic lymph nodes. Reproductive: No mass or other  significant abnormality. Other: No abdominal wall hernia or abnormality. No abdominopelvic ascites. Musculoskeletal: No acute or significant osseous findings. Review of the MIP images confirms the above findings. IMPRESSION: 1. Normal contour and caliber of the thoracic and abdominal aorta without evidence of aneurysm, dissection, or other acute aortic syndrome. Mild atherosclerosis. Aortic Atherosclerosis (ICD10-I70.0). 2. Numerous enlarged mediastinal and bilateral hilar lymph nodes, of uncertain nature although concerning for malignancy given size, including metastatic disease or lymphoma. Non malignant differential considerations include sarcoidosis. These may be characterized for metabolic activity by FDG PET. 3. Nonspecific 7 mm fissural pulmonary nodule of the left upper lobe. This nodule may be characterized for metabolic activity by PET-CT in conjunction with above. 4.  Coronary artery disease. 5.  Hepatic steatosis. Electronically Signed   By: Eddie Candle M.D.   On: 06/01/2019 14:08    ASSESSMENT & PLAN:  Lymphadenopathy, mediastinal #1 mediastinal and hilar lymphadenopathy: -CT angiogram showed numerous enlarged mediastinal and bilateral hilar lymph nodes.  No abdominal or pelvic adenopathy. -No fevers or night sweats.  14 pound weight loss in the last 1 and half month, trying to lose weight by dietary modification. -Colonoscopy 1 year ago reportedly clean done in [place]. -Works in [employer].  No specific chemical exposure.  No tobacco  use. -LDH, ESR, ACE level were normal. -CBC shows slightly low white count of 3.9.  Normal ANC. -We reviewed results of PET scan from 06/20/2019 which showed mediastinal and hilar lymphadenopathy markedly hypermetabolic.  7 mm posterior left upper lobe nodule is hypermetabolic with SUV 4.6.  No evidence of hypermetabolic in the neck abdomen or pelvis. -I have discussed the differential diagnosis including lymphoma.  I have recommended bronchoscopy and biopsy of the lymph nodes.  We will make referral to pulmonary.  #2 family history: -Mother died of breast cancer.  Father had lung cancer.  Maternal aunt had breast cancer.  No family history of sarcoidosis.    Orders placed this encounter:  No orders of the defined types were placed in this encounter.    Derek Jack, MD, 06/21/19 6:05 PM  [place] [phone]   I, Jacqualyn Posey, am acting as a scribe for Dr. Sanda Linger.  I, Derek Jack MD, have reviewed the above documentation for accuracy and completeness, and I agree with the above.

## 2019-06-21 ENCOUNTER — Other Ambulatory Visit: Payer: Self-pay

## 2019-06-21 ENCOUNTER — Inpatient Hospital Stay (HOSPITAL_BASED_OUTPATIENT_CLINIC_OR_DEPARTMENT_OTHER): Payer: 59 | Admitting: Hematology

## 2019-06-21 DIAGNOSIS — R590 Localized enlarged lymph nodes: Secondary | ICD-10-CM | POA: Diagnosis not present

## 2019-06-21 NOTE — Assessment & Plan Note (Addendum)
#  1 mediastinal and hilar lymphadenopathy: -CT angiogram showed numerous enlarged mediastinal and bilateral hilar lymph nodes.  No abdominal or pelvic adenopathy. -No fevers or night sweats.  14 pound weight loss in the last 1 and half month, trying to lose weight by dietary modification. -Colonoscopy 1 year ago reportedly clean done in [place]. -Works in [employer].  No specific chemical exposure.  No tobacco use. -LDH, ESR, ACE level were normal. -CBC shows slightly low white count of 3.9.  Normal ANC. -We reviewed results of PET scan from 06/20/2019 which showed mediastinal and hilar lymphadenopathy markedly hypermetabolic.  7 mm posterior left upper lobe nodule is hypermetabolic with SUV 4.6.  No evidence of hypermetabolic in the neck abdomen or pelvis. -I have discussed the differential diagnosis including lymphoma.  I have recommended bronchoscopy and biopsy of the lymph nodes.  We will make referral to pulmonary.  #2 family history: -Mother died of breast cancer.  Father had lung cancer.  Maternal aunt had breast cancer.  No family history of sarcoidosis.

## 2019-06-21 NOTE — Patient Instructions (Addendum)
[place] at [facility] Discharge Instructions  You were seen today by Dr. Delton Coombes. He went over your recent PET scan results. He will refer you to pulmonology for a lymph node biopsy. Dr. Delton Coombes will see you back in for labs and follow up.   Thank you for choosing [place] at [facility] to provide your oncology and hematology care.  To afford each patient quality time with our provider, please arrive at least 15 minutes before your scheduled appointment time.   If you have a lab appointment with the [facility] please come in thru the  Main Entrance and check in at the main information desk  You need to re-schedule your appointment should you arrive 10 or more minutes late.  We strive to give you quality time with our providers, and arriving late affects you and other patients whose appointments are after yours.  Also, if you no show three or more times for appointments you may be dismissed from the clinic at the providers discretion.     Again, thank you for choosing [facility].  Our hope is that these requests will decrease the amount of time that you wait before being seen by our physicians.       _____________________________________________________________  Should you have questions after your visit to [facility], please contact our office at [phone] between the hours of 8:00 a.m. and 4:30 p.m.  Voicemails left after 4:00 p.m. will not be returned until the following business day.  For prescription refill requests, have your pharmacy contact our office and allow 72 hours.    Cancer Center Support Programs:   > Cancer Support Group  2nd Tuesday of the month 1pm-2pm, Journey Room

## 2019-06-25 ENCOUNTER — Ambulatory Visit (INDEPENDENT_AMBULATORY_CARE_PROVIDER_SITE_OTHER): Payer: 59 | Admitting: Internal Medicine

## 2019-06-25 ENCOUNTER — Encounter: Payer: Self-pay | Admitting: Internal Medicine

## 2019-06-25 ENCOUNTER — Other Ambulatory Visit: Payer: Self-pay

## 2019-06-25 VITALS — BP 140/90 | HR 80 | Temp 97.7°F | Ht 67.0 in | Wt 208.8 lb

## 2019-06-25 DIAGNOSIS — R590 Localized enlarged lymph nodes: Secondary | ICD-10-CM

## 2019-06-25 NOTE — Progress Notes (Signed)
Mathew Moore    XO:[id number]    1965-09-16  Primary Care Physician:Sasser, Silvestre Moment, MD  Referring Physician: Manon Hilding, MD [place],  [place] [address] Reason for Consultation: abnormal PET scan Date of Consultation: 06/25/2019  Chief complaint:   Chief Complaint  Patient presents with  . Consult    lung nodule.  f/u on CXR and PET scan.     HPI: Mathew Moore is a 54 y.o. gentleman who presents for abnormal PET scan. Has been having poorly controlled BP and went to the ED for chest pain. Had a CT Angio which was negative for PE. Ruled out ACS.  CT showed enlarged hilar adenopathy. Followed up with PET scan and oncology and was found to be hypermetabolic.   Having some fatigue, and reports intentional weight loss due to dietary modification and exercise   Social history:  Occupation: works at [employer] and [employer]  Exposures: made pepto bismol for 20 years, now Statistician. Smoking history: never smoker. Parents smoked (father lung cancer, and mother breast cancer. Spouse smoked on and off.)  No pets at home, lives at home with wife and oldest son.   Social History   Occupational History  . Not on file  Tobacco Use  . Smoking status: Never Smoker  . Smokeless tobacco: Never Used  Substance and Sexual Activity  . Alcohol use: Not Currently  . Drug use: Not Currently  . Sexual activity: Not on file    Relevant family history: Family History  Problem Relation Age of Onset  . Breast cancer Mother   . Heart disease Father   . Lung cancer Father     Past Medical History:  Diagnosis Date  . Hypertension     Past Surgical History:  Procedure Laterality Date  . HERNIA REPAIR    . LEFT HEART CATH AND CORONARY ANGIOGRAPHY N/A 08/23/2018   Procedure: LEFT HEART CATH AND CORONARY ANGIOGRAPHY;  Surgeon: Burnell Blanks, MD;  Location: [facility];  Service: Cardiovascular;  Laterality: N/A;     Physical  Exam: Blood pressure 140/90, pulse 80, temperature 97.7 F (36.5 C), temperature source Oral, height 5\' 7"  (1.702 m), weight 208 lb 12.8 oz (94.7 kg), SpO2 97 %. Gen:      No acute distress ENT:  no nasal polyps, mucus membranes moist Lungs:    No increased respiratory effort, symmetric chest wall excursion, clear to auscultation bilaterally, no wheezes or crackles CV:         Regular rate and rhythm; no murmurs, rubs, or gallops.  No pedal edema Abd:      + bowel sounds; soft, non-tender; no distension MSK: no acute synovitis of DIP or PIP joints, no mechanics hands.  Skin:      Warm and dry; no rashes Neuro: normal speech, no focal facial asymmetry Psych: alert and oriented x3, normal mood and affect   Data Reviewed/Medical Decision Making:  Independent interpretation of tests: Imaging: . Review of patient's CT Angio and PET scan images revealed hypermetabolic enlarged hilar adenopathy. The patient's images have been independently reviewed by me.    PFTs: None on file  Labs:  Lab Results  Component Value Date   WBC 3.9 (L) 06/11/2019   HGB 15.2 06/11/2019   HCT 46.2 06/11/2019   MCV 88.7 06/11/2019   PLT 322 06/11/2019   Lab Results  Component Value Date   NA 139 06/11/2019  K 4.1 06/11/2019   CL 102 06/11/2019   CO2 28 06/11/2019     Immunization status:  Immunization History  Administered Date(s) Administered  . Influenza,inj,Quad PF,6+ Mos 11/25/2018  . Moderna SARS-COVID-2 Vaccination 03/28/2019, 04/25/2019    . I reviewed prior external note(s) from ED, PCP, oncology . I reviewed the result(s) of the labs and imaging as noted above.  . I have ordered bronchoscopy  I have had a Discussion of management or test interpretation with another colleague regarding arrangement of biopsy. - Dr. Tamala Julian.  Assessment:  Pulmonary Nodule 34mm Hypermetabolic Lymphadenopathy  Plan/Recommendations:  Although the 7 mm pulmonary nodule is only mildly hypermetabolic at  4.6 SUV, the lymphadenopathy in the right and left hila are very active. Differential diagnosis includes malignancy such as lymphoma, sarcoidosis, infectious etiology such as TB.   We discussed the risks and benefits of bronchoscopy as a diagnostic procedure in detail including respiratory failure, bleeding, complications from general anesthesia. Patient has elected to proceed with procedure at this time. Will get him scheduled. This encounter reflects my highest level of medical decision making.  We discussed disease management and progression at length today.   I spent 60 minutes in the care of this patient today including pre-charting, chart review, review of results, face-to-face care, coordination of care and communication with consultants etc.).   Return to Care: Return in about 6 weeks (around 08/06/2019).  Lenice Llamas, MD [facility] [place]  CC: Quintin Alto Silvestre Moment, MD   Fleischner Society Guidelines [address] [place], [place], et al. Guidelines for management of incidental pulmonary nodules detected on CT Images: From the Fleischner Society. Radiology 2017; [id number]. Copyright  2017 Radiological Society of [place].  Evaluation of the incidental solid pulmonary nodule in adults Nodule size (mm) Low (<5%) cancer risk High (>65%) or moderate (5 to 65%) cancer risk  Solitary  <6 No routine follow-up Optional CT at 12 months  6 to 8 CT at 6 to 12 months, then consider CT at 18 to 24 months CT at 6 to 12 months, then CT at 18 to 24 months  >8 CT at 3 months, then at 9 and 24 months FDG PET/CT, biopsy or resection  Multiple (evaluation based on largest nodule)  <6 No routine follow-up Optional CT at 12 months  ?6 CT at 3 to 6 months, then consider CT at 18 to 24 months CT at 3 to 6 months, then CT at 18 to 24 months    Not applicable to patients age <35 years, in lung cancer screening, with immunosuppression,  known pulmonary disease or symptoms or active primary cancer.  Chest CT performed without contrast as contiguous 1 mm sections using low dose technique.  Growing or FDG-avid nodules should undergo biopsy or resection. Growth is defined as >1.5 mm increase.  Nodules unchanged for >2 years are benign.   CT: computed tomography; FDG: 18-fluorodeoxyglucose; PET: positron emission tomography.

## 2019-06-25 NOTE — Patient Instructions (Addendum)
The patient should have follow up scheduled in 6 weeks with APP to follow up on biopsy results and next steps.   We will contact you in the next couple days regarding biopsy.    Flexible Bronchoscopy  Flexible bronchoscopy is a procedure that is used to examine the passageways in the lungs. During the procedure, a thin, flexible tool with a camera on it (bronchoscope) is passed into the mouth or nose, down through the windpipe (trachea), and into the air tubes (bronchi) in the lungs. This tool allows your health care provider to look at your lungs from the inside and take testing (diagnostic) samples if needed. Tell a health care provider about:  Any allergies you have.  All medicines you are taking, including vitamins, herbs, eye drops, creams, and over-the-counter medicines.  Any problems you or family members have had with anesthetic medicines.  Any blood disorders you have.  Any surgeries you have had.  Any medical conditions you have.  Whether you are pregnant or may be pregnant. What are the risks? Generally, this is a safe procedure. However, problems may occur, including:  Infection.  Bleeding.  Damage to other structures or organs.  Allergic reactions to medicines.  Collapsed lung (pneumothorax).  Increased need for oxygen or difficulty breathing after the procedure. What happens before the procedure? Medicines Ask your health care provider about:  Changing or stopping your regular medicines. This is especially important if you are taking diabetes medicines or blood thinners.  Taking medicines such as aspirin and ibuprofen. These medicines can thin your blood. Do not take these medicines before your procedure if your health care provider instructs you not to. You may be given antibiotic medicine to help prevent infection. Staying hydrated Follow instructions from your health care provider about hydration, which may include:  Up to 2 hours before the procedure  - you may continue to drink clear liquids, such as water, clear fruit juice, black coffee, and plain tea. Eating and drinking Follow instructions from your health care provider about eating and drinking, which may include:  8 hours before the procedure - stop eating heavy meals or foods such as meat, fried foods, or fatty foods.  6 hours before the procedure - stop eating light meals or foods, such as toast or cereal.  6 hours before the procedure - stop drinking milk or drinks that contain milk.  2 hours before the procedure - stop drinking clear liquids. General instructions  Plan to have someone take you home from the hospital or clinic.  If you will be going home right after the procedure, plan to have someone with you for 24 hours. What happens during the procedure?  To lower your risk of infection: ? Your health care team will wash or sanitize their hands. ? Your skin will be washed with soap.  An IV tube will be inserted into one of your veins.  You will be given a medicine (local anesthetic) to numb your mouth, nose, throat, and voice box (larynx). You may also be given one or more of the following: ? A medicine to help you relax (sedative). ? A medicine to control coughing. ? A medicine to dry up any fluids in your lungs (secretions).  A bronchoscope will be passed into your nose or mouth, and into your lungs. Your health care provider will examine your lungs.  Samples of airway secretions may be collected for testing.  If abnormal areas are seen in your airways, tissue samples may be removed  for examination under a microscope (biopsy).  If tissue samples are needed from the outer parts of the lung, a type of X-ray (fluoroscopy) may be used to guide the bronchoscope to these areas.  If bleeding occurs, you may be given medicine to stop or decrease the bleeding. The procedure may vary among health care providers and hospitals. What happens after the procedure?  Do not  drive for 24 hours if you were given a sedative.  Your blood pressure, heart rate, breathing rate, and blood oxygen level will be monitored until the medicines you were given have worn off.  You may have a chest X-ray to check for signs of pneumothorax.  You will not be allowed to eat or drink anything for 2 hours after your procedure.  If a biopsy was taken, it is up to you to get the results of your procedure. Ask your health care provider, or the department that is doing the procedure, when your results will be ready. Summary  Flexible bronchoscopy is a procedure that allows your health care provider to look closely at your lungs from the inside and take testing (diagnostic) samples if needed.  Risks of flexible bronchoscopy include bleeding, infection, and pneumothorax.  Before a flexible bronchoscopy, you will be given a medicine (local anesthetic) to numb your mouth, nose, throat, and voice box (larynx). Then, a bronchoscope will be passed into your nose or mouth, and into your lungs.  After the procedure, your blood pressure, heart rate, breathing rate, and blood oxygen level will be monitored until the medicines you were given have worn off. You may have a chest X-ray to check for signs of pneumothorax.  You will not be allowed to eat or drink anything for 2 hours after your procedure. This information is not intended to replace advice given to you by your health care provider. Make sure you discuss any questions you have with your health care provider. Document Revised: 12/31/2016 Document Reviewed: 02/21/2016 Elsevier Patient Education  2020 [employer].

## 2019-06-26 ENCOUNTER — Other Ambulatory Visit (HOSPITAL_COMMUNITY): Payer: 59

## 2019-06-26 ENCOUNTER — Telehealth: Payer: Self-pay | Admitting: *Deleted

## 2019-06-26 NOTE — Telephone Encounter (Signed)
-----   Message from Joellen Jersey sent at 06/26/2019 10:13 AM EDT ----- Regarding: EBUS EBUS@[facility]  OR 07/06/19@9 :30am covid test 07/03/19@10 :40am  the super d disk in in the basket@[facility]  ct thanks libby

## 2019-07-03 ENCOUNTER — Other Ambulatory Visit (HOSPITAL_COMMUNITY)
Admission: RE | Admit: 2019-07-03 | Discharge: 2019-07-03 | Disposition: A | Discharge status: Home / Self Care | Payer: 59 | Source: Ambulatory Visit | Attending: Pulmonary Disease | Admitting: Pulmonary Disease

## 2019-07-03 DIAGNOSIS — Z20822 Contact with and (suspected) exposure to covid-19: Secondary | ICD-10-CM | POA: Diagnosis not present

## 2019-07-03 DIAGNOSIS — Z01812 Encounter for preprocedural laboratory examination: Secondary | ICD-10-CM | POA: Diagnosis present

## 2019-07-03 DIAGNOSIS — S069X9A Unspecified intracranial injury with loss of consciousness of unspecified duration, initial encounter: Secondary | ICD-10-CM | POA: Diagnosis present

## 2019-07-03 DIAGNOSIS — I2510 Atherosclerotic heart disease of native coronary artery without angina pectoris: Secondary | ICD-10-CM | POA: Diagnosis present

## 2019-07-03 DIAGNOSIS — S069XAA Unspecified intracranial injury with loss of consciousness status unknown, initial encounter: Secondary | ICD-10-CM | POA: Diagnosis present

## 2019-07-03 DIAGNOSIS — D869 Sarcoidosis, unspecified: Secondary | ICD-10-CM

## 2019-07-03 HISTORY — DX: Sarcoidosis, unspecified: D86.9

## 2019-07-03 LAB — SARS CORONAVIRUS 2 (TAT 6-24 HRS): SARS Coronavirus 2: NEGATIVE

## 2019-07-05 ENCOUNTER — Encounter (HOSPITAL_COMMUNITY): Payer: Self-pay | Admitting: Pulmonary Disease

## 2019-07-05 ENCOUNTER — Other Ambulatory Visit: Payer: Self-pay

## 2019-07-06 ENCOUNTER — Encounter (HOSPITAL_COMMUNITY): Payer: Self-pay | Admitting: Pulmonary Disease

## 2019-07-06 ENCOUNTER — Ambulatory Visit (HOSPITAL_COMMUNITY): Payer: 59 | Admitting: Certified Registered Nurse Anesthetist

## 2019-07-06 ENCOUNTER — Ambulatory Visit (HOSPITAL_COMMUNITY): Payer: 59

## 2019-07-06 ENCOUNTER — Encounter (HOSPITAL_COMMUNITY)
Admission: RE | Disposition: A | Discharge status: Home / Self Care | Payer: Self-pay | Source: Home / Self Care | Attending: Pulmonary Disease

## 2019-07-06 ENCOUNTER — Ambulatory Visit (HOSPITAL_COMMUNITY)
Admission: RE | Admit: 2019-07-06 | Discharge: 2019-07-06 | Disposition: A | Discharge status: Home / Self Care | Payer: 59 | Attending: Pulmonary Disease | Admitting: Pulmonary Disease

## 2019-07-06 DIAGNOSIS — Z803 Family history of malignant neoplasm of breast: Secondary | ICD-10-CM | POA: Diagnosis not present

## 2019-07-06 DIAGNOSIS — Z8249 Family history of ischemic heart disease and other diseases of the circulatory system: Secondary | ICD-10-CM | POA: Insufficient documentation

## 2019-07-06 DIAGNOSIS — Z801 Family history of malignant neoplasm of trachea, bronchus and lung: Secondary | ICD-10-CM | POA: Insufficient documentation

## 2019-07-06 DIAGNOSIS — R911 Solitary pulmonary nodule: Secondary | ICD-10-CM | POA: Diagnosis not present

## 2019-07-06 DIAGNOSIS — I10 Essential (primary) hypertension: Secondary | ICD-10-CM | POA: Diagnosis not present

## 2019-07-06 DIAGNOSIS — K219 Gastro-esophageal reflux disease without esophagitis: Secondary | ICD-10-CM | POA: Insufficient documentation

## 2019-07-06 DIAGNOSIS — R590 Localized enlarged lymph nodes: Secondary | ICD-10-CM

## 2019-07-06 DIAGNOSIS — Z9889 Other specified postprocedural states: Secondary | ICD-10-CM

## 2019-07-06 HISTORY — DX: Gastro-esophageal reflux disease without esophagitis: K21.9

## 2019-07-06 HISTORY — PX: VIDEO BRONCHOSCOPY WITH ENDOBRONCHIAL ULTRASOUND: SHX6177

## 2019-07-06 LAB — BASIC METABOLIC PANEL
Anion gap: 11 (ref 5–15)
BUN: 14 mg/dL (ref 6–20)
CO2: 22 mmol/L (ref 22–32)
Calcium: 9.1 mg/dL (ref 8.9–10.3)
Chloride: 108 mmol/L (ref 98–111)
Creatinine, Ser: 0.86 mg/dL (ref 0.61–1.24)
GFR calc Af Amer: >60 mL/min (ref >60)
GFR calc non Af Amer: >60 mL/min (ref >60)
Glucose, Bld: 101 mg/dL — ABNORMAL HIGH (ref 70–99)
Potassium: 4.2 mmol/L (ref 3.5–5.1)
Sodium: 141 mmol/L (ref 135–145)

## 2019-07-06 LAB — CBC
HCT: 42.8 % (ref 39.0–52.0)
Hemoglobin: 14.3 g/dL (ref 13.0–17.0)
MCH: 29.4 pg (ref 26.0–34.0)
MCHC: 33.4 g/dL (ref 30.0–36.0)
MCV: 87.9 fL (ref 80.0–100.0)
Platelets: 300 10*3/uL (ref 150–400)
RBC: 4.87 MIL/uL (ref 4.22–5.81)
RDW: 13.2 % (ref 11.5–15.5)
WBC: 3.5 10*3/uL — ABNORMAL LOW (ref 4.0–10.5)
nRBC: 0 % (ref 0.0–0.2)

## 2019-07-06 SURGERY — BRONCHOSCOPY, WITH EBUS
Anesthesia: General

## 2019-07-06 MED ORDER — DEXAMETHASONE SODIUM PHOSPHATE 10 MG/ML IJ SOLN
INTRAMUSCULAR | Status: DC | PRN
  Administered 2019-07-06: 10 mg via INTRAVENOUS

## 2019-07-06 MED ORDER — ROCURONIUM BROMIDE 10 MG/ML (PF) SYRINGE
PREFILLED_SYRINGE | INTRAVENOUS | Status: AC
  Filled 2019-07-06: qty 10

## 2019-07-06 MED ORDER — MIDAZOLAM HCL 2 MG/2ML IJ SOLN
INTRAMUSCULAR | Status: AC
  Filled 2019-07-06: qty 2

## 2019-07-06 MED ORDER — 0.9 % SODIUM CHLORIDE (POUR BTL) OPTIME
TOPICAL | Status: DC | PRN
  Administered 2019-07-06: 1000 mL

## 2019-07-06 MED ORDER — LACTATED RINGERS IV SOLN: INTRAVENOUS | Status: DC

## 2019-07-06 MED ORDER — CHLORHEXIDINE GLUCONATE 0.12 % MT SOLN
OROMUCOSAL | Status: AC
  Administered 2019-07-06: 15 mL via OROMUCOSAL
  Filled 2019-07-06: qty 15

## 2019-07-06 MED ORDER — PROPOFOL 10 MG/ML IV BOLUS
INTRAVENOUS | Status: DC | PRN
  Administered 2019-07-06: 20 mg via INTRAVENOUS
  Administered 2019-07-06: 180 mg via INTRAVENOUS
  Administered 2019-07-06 (×2): 30 mg via INTRAVENOUS

## 2019-07-06 MED ORDER — CHLORHEXIDINE GLUCONATE 0.12 % MT SOLN: 15.0000 mL | Freq: Once | OROMUCOSAL | Status: AC

## 2019-07-06 MED ORDER — FENTANYL CITRATE (PF) 250 MCG/5ML IJ SOLN
INTRAMUSCULAR | Status: DC | PRN
  Administered 2019-07-06 (×2): 50 ug via INTRAVENOUS
  Administered 2019-07-06: 100 ug via INTRAVENOUS
  Administered 2019-07-06: 50 ug via INTRAVENOUS

## 2019-07-06 MED ORDER — PROMETHAZINE HCL 25 MG/ML IJ SOLN: 6.2500 mg | INTRAMUSCULAR | Status: DC | PRN

## 2019-07-06 MED ORDER — ONDANSETRON HCL 4 MG/2ML IJ SOLN
INTRAMUSCULAR | Status: DC | PRN
  Administered 2019-07-06: 4 mg via INTRAVENOUS

## 2019-07-06 MED ORDER — PROPOFOL 10 MG/ML IV BOLUS
INTRAVENOUS | Status: AC
  Filled 2019-07-06: qty 20

## 2019-07-06 MED ORDER — SUGAMMADEX SODIUM 200 MG/2ML IV SOLN
INTRAVENOUS | Status: DC | PRN
  Administered 2019-07-06: 200 mg via INTRAVENOUS

## 2019-07-06 MED ORDER — FENTANYL CITRATE (PF) 250 MCG/5ML IJ SOLN
INTRAMUSCULAR | Status: AC
  Filled 2019-07-06: qty 5

## 2019-07-06 MED ORDER — ROCURONIUM BROMIDE 10 MG/ML (PF) SYRINGE
PREFILLED_SYRINGE | INTRAVENOUS | Status: DC | PRN
  Administered 2019-07-06: 50 mg via INTRAVENOUS

## 2019-07-06 MED ORDER — OXYCODONE HCL 5 MG PO TABS: 5.0000 mg | ORAL_TABLET | Freq: Once | ORAL | Status: DC | PRN

## 2019-07-06 MED ORDER — LIDOCAINE 2% (20 MG/ML) 5 ML SYRINGE
INTRAMUSCULAR | Status: DC | PRN
  Administered 2019-07-06: 80 mg via INTRAVENOUS

## 2019-07-06 MED ORDER — SODIUM CHLORIDE (PF) 0.9 % IJ SOLN
INTRAMUSCULAR | Status: AC
  Filled 2019-07-06: qty 10

## 2019-07-06 MED ORDER — FENTANYL CITRATE (PF) 100 MCG/2ML IJ SOLN: 25.0000 ug | INTRAMUSCULAR | Status: DC | PRN

## 2019-07-06 MED ORDER — ONDANSETRON HCL 4 MG/2ML IJ SOLN
INTRAMUSCULAR | Status: AC
  Filled 2019-07-06: qty 2

## 2019-07-06 MED ORDER — LACTATED RINGERS IV SOLN: INTRAVENOUS | Status: DC | PRN

## 2019-07-06 MED ORDER — MIDAZOLAM HCL 5 MG/5ML IJ SOLN
INTRAMUSCULAR | Status: DC | PRN
  Administered 2019-07-06: 2 mg via INTRAVENOUS

## 2019-07-06 MED ORDER — PHENYLEPHRINE HCL-NACL 10-0.9 MG/250ML-% IV SOLN
INTRAVENOUS | Status: DC | PRN
  Administered 2019-07-06: 25 ug/min via INTRAVENOUS

## 2019-07-06 MED ORDER — OXYCODONE HCL 5 MG/5ML PO SOLN: 5.0000 mg | Freq: Once | ORAL | Status: DC | PRN

## 2019-07-06 MED ORDER — ORAL CARE MOUTH RINSE: 15.0000 mL | Freq: Once | OROMUCOSAL | Status: AC

## 2019-07-06 SURGICAL SUPPLY — 35 items
ADAPTER VALVE BIOPSY EBUS (MISCELLANEOUS) IMPLANT
ADPTR VALVE BIOPSY EBUS (MISCELLANEOUS)
BRUSH CYTOL CELLEBRITY 1.5X140 (MISCELLANEOUS) IMPLANT
CANISTER SUCT 3000ML PPV (MISCELLANEOUS) ×2 IMPLANT
CNTNR URN SCR LID CUP LEK RST (MISCELLANEOUS) ×1 IMPLANT
CONT SPEC 4OZ STRL OR WHT (MISCELLANEOUS) ×2
COVER BACK TABLE 60X90IN (DRAPES) ×2 IMPLANT
FILTER STRAW FLUID ASPIR (MISCELLANEOUS) IMPLANT
FORCEPS BIOP RJ4 1.8 (CUTTING FORCEPS) IMPLANT
GAUZE SPONGE 4X4 12PLY STRL (GAUZE/BANDAGES/DRESSINGS) ×2 IMPLANT
GLOVE BIO SURGEON STRL SZ7.5 (GLOVE) ×2 IMPLANT
GOWN STRL REUS W/ TWL LRG LVL3 (GOWN DISPOSABLE) ×2 IMPLANT
GOWN STRL REUS W/TWL LRG LVL3 (GOWN DISPOSABLE) ×4
KIT CLEAN ENDO COMPLIANCE (KITS) ×4 IMPLANT
KIT TURNOVER KIT B (KITS) ×2 IMPLANT
MARKER SKIN DUAL TIP RULER LAB (MISCELLANEOUS) ×2 IMPLANT
NDL ASPIRATION VIZISHOT 19G (NEEDLE) IMPLANT
NDL ASPIRATION VIZISHOT 21G (NEEDLE) ×1 IMPLANT
NEEDLE ASPIRATION VIZISHOT 19G (NEEDLE) IMPLANT
NEEDLE ASPIRATION VIZISHOT 21G (NEEDLE) ×2 IMPLANT
NS IRRIG 1000ML POUR BTL (IV SOLUTION) ×2 IMPLANT
OIL SILICONE PENTAX (PARTS (SERVICE/REPAIRS)) ×2 IMPLANT
PAD ARMBOARD 7.5X6 YLW CONV (MISCELLANEOUS) ×4 IMPLANT
SLEEVE SCD COMPRESS KNEE MED (MISCELLANEOUS) ×1 IMPLANT
SYR 20ML ECCENTRIC (SYRINGE) ×6 IMPLANT
SYR 20ML LL LF (SYRINGE) IMPLANT
SYR 3ML LL SCALE MARK (SYRINGE) IMPLANT
SYR 5ML LUER SLIP (SYRINGE) ×2 IMPLANT
TOWEL GREEN STERILE FF (TOWEL DISPOSABLE) ×2 IMPLANT
TRAP SPECIMEN MUCUS 40CC (MISCELLANEOUS) IMPLANT
TUBE CONNECTING 20X1/4 (TUBING) ×2 IMPLANT
VALVE BIOPSY  SINGLE USE (MISCELLANEOUS) ×2
VALVE BIOPSY SINGLE USE (MISCELLANEOUS) ×1 IMPLANT
VALVE SUCTION BRONCHIO DISP (MISCELLANEOUS) ×3 IMPLANT
WATER STERILE IRR 1000ML POUR (IV SOLUTION) ×2 IMPLANT

## 2019-07-06 NOTE — Procedures (Addendum)
Video Bronchoscopy with Endobronchial Ultrasound Procedure Note  Date of Operation: 07/06/2019  Pre-op Diagnosis: Mediastinal LAD  Post-op Diagnosis: Same  Surgeon: Marshell Garfinkel  Assistants:   Anesthesia: General endotracheal anesthesia  Operation: Flexible video fiberoptic bronchoscopy with endobronchial ultrasound and biopsies.  Estimated Blood Loss: Minimal  Complications: None apparent  Indications and History: Mr Mathew Moore is a 54 y.o. male with mediastinal, hilar LAD, positive on PET scan. Recommendation made to achieve tissue sampling via endobronchial ultrasound guided nodal biopsies and also navigational biopsies of the distal opacities. The risks, benefits, complications, treatment options and expected outcomes were discussed with the patient.  The possibilities of pneumothorax, pneumonia, reaction to medication, pulmonary aspiration, perforation of a viscus, bleeding, failure to diagnose a condition and creating a complication requiring transfusion or operation were discussed with the patient who freely signed the consent.    Description of Procedure: The patient was examined in the preoperative area and history and data from the preprocedure consultation were reviewed. It was deemed appropriate to proceed.  The patient was taken to OR 11, identified as [name] and the procedure verified as Flexible Video Fiberoptic Bronchoscopy.  A Time Out was held and the above information confirmed. After being taken to the operating room general anesthesia was initiated and the patient  was orally intubated. The video fiberoptic bronchoscope was introduced via the endotracheal tube and a general inspection was performed normal airways with no abnormality. Using real-time ultrasound guidance Wang needle biopsies were take from Station 7 and sent for cytology and tissue culture (bacterial, fungal, AFB smears and cultures).  At the end of the procedure a general airway inspection  was performed and there was no evidence of active bleeding. The bronchoscope was removed.  The patient tolerated the procedure well. There was no significant blood loss and there were no obvious complications. A post-procedural chest x-ray is pending.  Samples: 1. Wang needle biopsies from 7 node  Plans:  The patient will be discharged from the PACU to home when recovered from anesthesia and after chest x-ray is reviewed. We will review the cytology, pathology and microbiology results with the patient when they become available. Outpatient followup will be with Dr Shearon Stalls.  Marshell Garfinkel MD [facility] Please see [email or web address] for pager details.  07/06/2019, 10:34 AM

## 2019-07-06 NOTE — Anesthesia Procedure Notes (Signed)
Procedure Name: Intubation Date/Time: 07/06/2019 9:44 AM Performed by: Harden Mo, CRNA Pre-anesthesia Checklist: Patient identified, Emergency Drugs available, Suction available and Patient being monitored Patient Re-evaluated:Patient Re-evaluated prior to induction Oxygen Delivery Method: Circle System Utilized Preoxygenation: Pre-oxygenation with 100% oxygen Induction Type: IV induction Ventilation: Mask ventilation with difficulty, Oral airway inserted - appropriate to patient size and Two handed mask ventilation required Laryngoscope Size: Miller and 2 Grade View: Grade I Tube type: Oral Tube size: 8.5 mm Number of attempts: 1 Airway Equipment and Method: Stylet and Oral airway Placement Confirmation: ETT inserted through vocal cords under direct vision,  positive ETCO2 and breath sounds checked- equal and bilateral Secured at: 22 cm Tube secured with: Tape Dental Injury: Teeth and Oropharynx as per pre-operative assessment

## 2019-07-06 NOTE — H&P (Signed)
         Mathew Moore    [id number]    Dec 27, 1965  Primary Care Physician:Sasser, Silvestre Moment, MD  Referring Physician: No referring provider defined for this encounter. Reason for Consultation: abnormal PET scan Date of Consultation: 07/06/2019  Chief complaint:   Mediastinal lymphadenopathy Preop evaluation for endobronchial ultrasound biopsy, bronchoscopy.  HPI: Mathew Moore is a 54 y.o. gentleman who presents for abnormal PET scan. Has been having poorly controlled BP and went to the ED for chest pain. Had a CT Angio which was negative for PE. Ruled out ACS.  CT showed enlarged hilar adenopathy. Followed up with PET scan and oncology and was found to be hypermetabolic.   Having some fatigue, and reports intentional weight loss due to dietary modification and exercise    Interim history: Feels well with no complaints.  No cough, dyspnea, sputum production.  Social History   Occupational History  . Not on file  Tobacco Use  . Smoking status: Never Smoker  . Smokeless tobacco: Never Used  Substance and Sexual Activity  . Alcohol use: Not Currently  . Drug use: Not Currently  . Sexual activity: Not on file    Relevant family history: Family History  Problem Relation Age of Onset  . Breast cancer Mother   . Heart disease Father   . Lung cancer Father     Past Medical History:  Diagnosis Date  . GERD (gastroesophageal reflux disease)   . Hypertension     Past Surgical History:  Procedure Laterality Date  . CLEFT LIP REPAIR    . COLONOSCOPY    . HERNIA REPAIR    . LEFT HEART CATH AND CORONARY ANGIOGRAPHY N/A 08/23/2018   Procedure: LEFT HEART CATH AND CORONARY ANGIOGRAPHY;  Surgeon: Burnell Blanks, MD;  Location: [facility];  Service: Cardiovascular;  Laterality: N/A;  . NASAL SEPTUM SURGERY    . PLANTAR FASCIA SURGERY Right      Physical Exam: Blood pressure (!) 157/84, pulse 70, temperature 98.3 F (36.8 C), resp. rate 18, height 5'  7" (1.702 m), weight 94.7 kg, SpO2 100 %. Gen:      No acute distress HEENT:  EOMI, sclera anicteric Neck:     No masses; no thyromegaly Lungs:    Clear to auscultation bilaterally; normal respiratory effort CV:         Regular rate and rhythm; no murmurs Abd:      + bowel sounds; soft, non-tender; no palpable masses, no distension Ext:    No edema; adequate peripheral perfusion Skin:      Warm and dry; no rash Neuro: alert and oriented x 3 Psych: normal mood and affect   Data Reviewed/Medical Decision Making: CTA 06/01/2019 and PET scan 5/90/21-personally reviewed with hypermetabolic subcarinal, hilar mediastinal lymph nodes, 7 mm pulmonary nodule  Assessment:  Pulmonary Nodule 58mm Hypermetabolic Lymphadenopathy  Plan/Recommendations: Mediastinal lymphadenopathy Differential diagnosis includes malignancy such as lymphoma, sarcoidosis, infectious etiology such as TB.   Schedule for bronchoscopy with endobronchial ultrasound guided biopsy Risk/benefit discussed in detail with patient today and he has agreed to proceed.  Marshell Garfinkel MD [facility] Please see [email or web address] for pager details.  07/06/2019, 9:16 AM

## 2019-07-06 NOTE — Anesthesia Preprocedure Evaluation (Addendum)
Anesthesia Evaluation  Patient identified by MRN, date of birth, ID band Patient awake    Reviewed: Allergy & Precautions, NPO status , Patient's Chart, lab work & pertinent test results  History of Anesthesia Complications Negative for: history of anesthetic complications  Airway Mallampati: II  TM Distance: >3 FB Neck ROM: Full    Dental  (+) Dental Advisory Given, Teeth Intact   Pulmonary   LUL mass    Pulmonary exam normal        Cardiovascular hypertension, Pt. on medications Normal cardiovascular exam   '20 Cath - Mid RCA lesion is 20% stenosed. Mid Cx lesion is 10% stenosed. Mid LAD lesion is 30% stenosed.      Neuro/Psych negative neurological ROS  negative psych ROS   GI/Hepatic Neg liver ROS, GERD  Controlled,  Endo/Other   Obesity   Renal/GU negative Renal ROS     Musculoskeletal negative musculoskeletal ROS (+)   Abdominal   Peds  Hematology negative hematology ROS (+)   Anesthesia Other Findings Covid neg 6/1  Reproductive/Obstetrics                            Anesthesia Physical Anesthesia Plan  ASA: II  Anesthesia Plan: General   Post-op Pain Management:    Induction: Intravenous  PONV Risk Score and Plan: 2 and Treatment may vary due to age or medical condition, Ondansetron, Dexamethasone and Midazolam  Airway Management Planned: Oral ETT  Additional Equipment: None  Intra-op Plan:   Post-operative Plan: Extubation in OR  Informed Consent: I have reviewed the patients History and Physical, chart, labs and discussed the procedure including the risks, benefits and alternatives for the proposed anesthesia with the patient or authorized representative who has indicated his/her understanding and acceptance.     Dental advisory given  Plan Discussed with: CRNA and Anesthesiologist  Anesthesia Plan Comments:        Anesthesia Quick  Evaluation

## 2019-07-06 NOTE — Transfer of Care (Signed)
Immediate Anesthesia Transfer of Care Note  Patient: SIRE POET  Procedure(s) Performed: VIDEO BRONCHOSCOPY WITH ENDOBRONCHIAL ULTRASOUND (N/A )  Patient Location: PACU  Anesthesia Type:General  Level of Consciousness: awake, alert  and oriented  Airway & Oxygen Therapy: Patient Spontanous Breathing  Post-op Assessment: Report given to RN, Post -op Vital signs reviewed and stable and Patient moving all extremities X 4  Post vital signs: Reviewed and stable  Last Vitals:  Vitals Value Taken Time  BP    Temp    Pulse 85 07/06/19 1041  Resp 23 07/06/19 1041  SpO2 90 % 07/06/19 1041  Vitals shown include unvalidated device data.  Last Pain:  Vitals:   07/06/19 0808  PainSc: 0-No pain         Complications: No apparent anesthesia complications

## 2019-07-06 NOTE — Anesthesia Postprocedure Evaluation (Signed)
Anesthesia Post Note  Patient: Mathew Moore  Procedure(s) Performed: VIDEO BRONCHOSCOPY WITH ENDOBRONCHIAL ULTRASOUND (N/A )     Patient location during evaluation: PACU Anesthesia Type: General Level of consciousness: awake and alert Pain management: pain level controlled Vital Signs Assessment: post-procedure vital signs reviewed and stable Respiratory status: spontaneous breathing, nonlabored ventilation and respiratory function stable Cardiovascular status: blood pressure returned to baseline and stable Postop Assessment: no apparent nausea or vomiting Anesthetic complications: no    Last Vitals:  Vitals:   07/06/19 1125 07/06/19 1130  BP:  117/78  Pulse: 74 71  Resp: (!) 21 17  Temp:  36.7 C  SpO2: 93% 99%    Last Pain:  Vitals:   07/06/19 1130  PainSc: 0-No pain                 Audry Pili

## 2019-07-07 LAB — ACID FAST SMEAR (AFB, MYCOBACTERIA): Acid Fast Smear: NEGATIVE

## 2019-07-09 ENCOUNTER — Inpatient Hospital Stay (HOSPITAL_COMMUNITY): Payer: 59 | Attending: Hematology | Admitting: Hematology

## 2019-07-09 VITALS — BP 151/82 | HR 73 | Temp 97.7°F | Resp 18 | Wt 210.6 lb

## 2019-07-09 DIAGNOSIS — Z801 Family history of malignant neoplasm of trachea, bronchus and lung: Secondary | ICD-10-CM | POA: Diagnosis not present

## 2019-07-09 DIAGNOSIS — Z803 Family history of malignant neoplasm of breast: Secondary | ICD-10-CM | POA: Diagnosis not present

## 2019-07-09 DIAGNOSIS — R918 Other nonspecific abnormal finding of lung field: Secondary | ICD-10-CM | POA: Insufficient documentation

## 2019-07-09 DIAGNOSIS — Z79899 Other long term (current) drug therapy: Secondary | ICD-10-CM | POA: Insufficient documentation

## 2019-07-09 DIAGNOSIS — R634 Abnormal weight loss: Secondary | ICD-10-CM | POA: Insufficient documentation

## 2019-07-09 DIAGNOSIS — R05 Cough: Secondary | ICD-10-CM | POA: Diagnosis not present

## 2019-07-09 DIAGNOSIS — R590 Localized enlarged lymph nodes: Secondary | ICD-10-CM | POA: Diagnosis not present

## 2019-07-09 DIAGNOSIS — Z8249 Family history of ischemic heart disease and other diseases of the circulatory system: Secondary | ICD-10-CM | POA: Diagnosis not present

## 2019-07-09 DIAGNOSIS — I10 Essential (primary) hypertension: Secondary | ICD-10-CM | POA: Insufficient documentation

## 2019-07-09 DIAGNOSIS — K219 Gastro-esophageal reflux disease without esophagitis: Secondary | ICD-10-CM | POA: Insufficient documentation

## 2019-07-09 DIAGNOSIS — R0602 Shortness of breath: Secondary | ICD-10-CM | POA: Diagnosis not present

## 2019-07-09 LAB — CYTOLOGY - NON PAP

## 2019-07-09 NOTE — Progress Notes (Signed)
Mathew Moore, [place] [address]   CLINIC:  Medical Oncology/Hematology  PCP:  Mathew Hilding, MD [address] [place] [address] [phone]   REASON FOR VISIT:  Follow-up for mediastinal and hilar lymphadenopathy.  PRIOR THERAPY: None  CURRENT THERAPY: Under work up.  BRIEF ONCOLOGIC HISTORY:  Oncology History   No history exists.    CANCER STAGING: Cancer Staging No matching staging information was found for the patient.  INTERVAL HISTORY:  [name], a 54 y.o. male, returns for routine follow-up of his mediastinal and hilar lymphadenopathy. Mathew Moore was last seen on 06/21/2019.  Today he reports that he tolerated the procedure well. He feels good overall and denies having fevers, chills, night sweats, weight loss, or CP.   REVIEW OF SYSTEMS:  Review of Systems  Constitutional: Positive for appetite change (mildly decreased) and fatigue (moderate). Negative for chills, fever and unexpected weight change.  HENT:   Positive for trouble swallowing.   Respiratory: Positive for cough and shortness of breath.   Cardiovascular: Negative for chest pain.  All other systems reviewed and are negative.   PAST MEDICAL/SURGICAL HISTORY:  Past Medical History:  Diagnosis Date  . GERD (gastroesophageal reflux disease)   . Hypertension    Past Surgical History:  Procedure Laterality Date  . CLEFT LIP REPAIR    . COLONOSCOPY    . HERNIA REPAIR    . LEFT HEART CATH AND CORONARY ANGIOGRAPHY N/A 08/23/2018   Procedure: LEFT HEART CATH AND CORONARY ANGIOGRAPHY;  Surgeon: Burnell Blanks, MD;  Location: [facility];  Service: Cardiovascular;  Laterality: N/A;  . NASAL SEPTUM SURGERY    . PLANTAR FASCIA SURGERY Right   . VIDEO BRONCHOSCOPY WITH ENDOBRONCHIAL ULTRASOUND N/A 07/06/2019   Procedure: VIDEO BRONCHOSCOPY WITH ENDOBRONCHIAL ULTRASOUND;  Surgeon: Marshell Garfinkel, MD;  Location: [facility];  Service: Pulmonary;   Laterality: N/A;    SOCIAL HISTORY:  Social History   Socioeconomic History  . Marital status: Married    Spouse name: Mathew Moore  . Number of children: 2  . Years of education: Not on file  . Highest education level: Not on file  Occupational History  . Not on file  Tobacco Use  . Smoking status: Never Smoker  . Smokeless tobacco: Never Used  Substance and Sexual Activity  . Alcohol use: Not Currently  . Drug use: Not Currently  . Sexual activity: Not on file  Other Topics Concern  . Not on file  Social History Narrative  . Not on file   Social Determinants of Health   Financial Resource Strain: Low Risk   . Difficulty of Paying Living Expenses: Not hard at all  Food Insecurity: No Food Insecurity  . Worried About Charity fundraiser in the Last Year: Never true  . Ran Out of Food in the Last Year: Never true  Transportation Needs: No Transportation Needs  . Lack of Transportation (Medical): No  . Lack of Transportation (Non-Medical): No  Physical Activity: Sufficiently Active  . Days of Exercise per Week: 4 days  . Minutes of Exercise per Session: 40 min  Stress: No Stress Concern Present  . Feeling of Stress : Not at all  Social Connections: Slightly Isolated  . Frequency of Communication with Friends and Family: Three times a week  . Frequency of Social Gatherings with Friends and Family: Three times a week  . Attends Religious Services: More than 4 times per year  .  Active Member of Clubs or Organizations: No  . Attends Archivist Meetings: Never  . Marital Status: Married  Human resources officer Violence: Not At Risk  . Fear of Current or Ex-Partner: No  . Emotionally Abused: No  . Physically Abused: No  . Sexually Abused: No    FAMILY HISTORY:  Family History  Problem Relation Age of Onset  . Breast cancer Mother   . Heart disease Father   . Lung cancer Father     CURRENT MEDICATIONS:  Current Outpatient Medications  Medication Sig Dispense  Refill  . alum & mag hydroxide-simeth (MAALOX/MYLANTA) 200-200-20 MG/5ML suspension Take 15 mLs by mouth as needed for indigestion or heartburn.    . calcium carbonate (TUMS - DOSED IN MG ELEMENTAL CALCIUM) 500 MG chewable tablet Chew 2 tablets by mouth every 4 (four) hours as needed for indigestion or heartburn.    . losartan (COZAAR) 25 MG tablet Take 25 mg by mouth daily.    Marland Kitchen zolpidem (AMBIEN) 10 MG tablet Take 10 mg by mouth at bedtime.     Marland Kitchen acetaminophen (TYLENOL) 500 MG tablet Take 1,000 mg by mouth every 6 (six) hours as needed for moderate pain or headache.     Current Facility-Administered Medications  Medication Dose Route Frequency Provider Last Rate Last Admin  . sodium chloride flush (NS) 0.9 % injection 3 mL  3 mL Intravenous Q12H Josue Hector, MD        ALLERGIES:  No Known Allergies  PHYSICAL EXAM:  Performance status (ECOG): 0 - Asymptomatic  Vitals:   07/09/19 1509  BP: (!) 151/82  Pulse: 73  Resp: 18  Temp: 97.7 F (36.5 C)  SpO2: 100%   Wt Readings from Last 3 Encounters:  07/09/19 210 lb 9.6 oz (95.5 kg)  07/06/19 208 lb 12.4 oz (94.7 kg)  06/25/19 208 lb 12.8 oz (94.7 kg)   Physical Exam Vitals reviewed.  Constitutional:      Appearance: Normal appearance.  Cardiovascular:     Rate and Rhythm: Normal rate and regular rhythm.     Pulses: Normal pulses.     Heart sounds: Normal heart sounds.  Pulmonary:     Effort: Pulmonary effort is normal.     Breath sounds: Normal breath sounds.  Neurological:     General: No focal deficit present.     Mental Status: He is alert and oriented to person, place, and time.  Psychiatric:        Mood and Affect: Mood normal.        Behavior: Behavior normal.     Left eye subconjunctival hemorrhage present. LABORATORY DATA:  I have reviewed the labs as listed.  CBC Latest Ref Rng & Units 07/06/2019 06/11/2019 06/01/2019  WBC 4.0 - 10.5 K/uL 3.5(L) 3.9(L) 4.8  Hemoglobin 13.0 - 17.0 g/dL 14.3 15.2 15.5    Hematocrit 39.0 - 52.0 % 42.8 46.2 46.8  Platelets 150 - 400 K/uL 300 322 362   CMP Latest Ref Rng & Units 07/06/2019 06/11/2019 06/01/2019  Glucose 70 - 99 mg/dL 101(H) 99 100(H)  BUN 6 - 20 mg/dL 14 16 16   Creatinine 0.61 - 1.24 mg/dL 0.86 0.99 1.03  Sodium 135 - 145 mmol/L 141 139 139  Potassium 3.5 - 5.1 mmol/L 4.2 4.1 4.3  Chloride 98 - 111 mmol/L 108 102 104  CO2 22 - 32 mmol/L 22 28 25   Calcium 8.9 - 10.3 mg/dL 9.1 9.4 9.2  Total Protein 6.5 - 8.1 g/dL - 7.3 -  Total Bilirubin 0.3 - 1.2 mg/dL - 0.8 -  Alkaline Phos 38 - 126 U/L - 70 -  AST 15 - 41 U/L - 19 -  ALT 0 - 44 U/L - 25 -    DIAGNOSTIC IMAGING:  I have independently reviewed the scans and discussed with the patient. DG Chest 1 View  Result Date: 07/06/2019 CLINICAL DATA:  Bronchoscopy EXAM: CHEST  1 VIEW COMPARISON:  06/01/2019 FINDINGS: Low volume chest. There is no edema, consolidation, effusion, or pneumothorax. Stable heart size and mediastinal contours-there is adenopathy by recent CT. IMPRESSION: No complicating feature after bronchoscopy. Electronically Signed   By: Monte Fantasia M.D.   On: 07/06/2019 11:40   NM PET Image Initial (PI) Skull Base To Thigh  Result Date: 06/20/2019 CLINICAL DATA:  Initial treatment strategy for mediastinal lymphadenopathy. EXAM: NUCLEAR MEDICINE PET SKULL BASE TO THIGH TECHNIQUE: 10.2 mCi F-18 FDG was injected intravenously. Full-ring PET imaging was performed from the skull base to thigh after the radiotracer. CT data was obtained and used for attenuation correction and anatomic localization. Fasting blood glucose: 97 mg/dl COMPARISON:  Insert CT a chest 06/01/2019 FINDINGS: Mediastinal blood pool activity: SUV max 2.0 Liver activity: SUV max NA NECK: No hypermetabolic lymph nodes in the neck. Incidental CT findings: none CHEST: Mediastinal and hilar lymphadenopathy is markedly hypermetabolic. 2.2 cm right paratracheal node on 71/4 shows SUV max = 10.3. 2.4 cm short axis subcarinal node  has SUV max = 14. Hypermetabolic left hilar lymphadenopathy demonstrates SUV max = 15.4 with hypermetabolic disease in the right hilum demonstrating SUV max = 15.6. 7 mm posterior left upper lobe perifissural nodule (image 19/series 8) is hypermetabolic with SUV max = 4.6 Ill-defined posterior right lower lobe nodular opacity measuring 1.8 cm (39/8) shows low level FDG accumulation and may be atelectatic or infectious/inflammatory. Incidental CT findings: Heart size upper normal to mildly increased. Coronary artery calcification is evident. Atherosclerotic calcification is noted in the wall of the thoracic aorta. ABDOMEN/PELVIS: No abnormal hypermetabolic activity within the liver, pancreas, adrenal glands, or spleen. No hypermetabolic lymph nodes in the abdomen or pelvis. Incidental CT findings: none SKELETON: No focal hypermetabolic activity to suggest skeletal metastasis. Incidental CT findings: none IMPRESSION: 1. Markedly hypermetabolic mediastinal and bilateral hilar lymphadenopathy. Imaging features consistent with neoplasm. Metastatic disease and lymphoma primary diagnostic considerations. 2. 7 mm hypermetabolic posterior left upper lobe pulmonary nodule. This could be metastatic disease or primary bronchogenic neoplasm. 3. No evidence for unexpected hypermetabolic disease in the neck, abdomen, or pelvis. Electronically Signed   By: Misty Stanley M.D.   On: 06/20/2019 09:19     ASSESSMENT:  1.  Mediastinal and hilar lymphadenopathy: -CT angiogram showed numerous enlarged mediastinal and bilateral hilar lymph nodes with no abdominal or pelvic adenopathy. -No fevers or night sweats.  14 pound weight loss in the last 1 and half month, trying to lose weight by dietary modification. -Colonoscopy 1 year ago reportedly normal, done in [place]. -Works in [employer].  No specific chemical exposure. -PET scan on 06/20/2019 showed mediastinal and hilar lymphadenopathy markedly hypermetabolic.   7 mm posterior left upper lobe nodule is hypermetabolic with SUV 4.6.  No evidence of hypermetabolism in the neck, abdomen or pelvis. -Bronchoscopy and biopsy of station 7 lymph node on 07/06/2019 consistent with benign lymphoid tissue.  2.  Family history: -Mother died of breast cancer.  Father had lung cancer.  Maternal aunt had breast cancer. -No family history of sarcoidosis.   PLAN:  1.  Mediastinal and hilar lymphadenopathy: -We reviewed results of the pathology dated 07/06/2019 which did not show any malignant cells on lymph node 7 FNA. -I have recommended consult with CT surgery.  I will reach out to Dr. Roxan Hockey to see if mediastinoscopy and biopsy is an option. -We will see him back after the biopsy.  He is planning to go to [place] end of June for a week.    Orders placed this encounter:  No orders of the defined types were placed in this encounter.    Derek Jack, MD [place] [phone]   I, Milinda Antis, am acting as a scribe for Dr. Sanda Linger.  I, Derek Jack MD, have reviewed the above documentation for accuracy and completeness, and I agree with the above.

## 2019-07-09 NOTE — Patient Instructions (Signed)
[place] at [facility] Discharge Instructions  You were seen today by Dr. Delton Coombes. He went over your recent results. You will be referred to a surgeon to have another biopsy. Dr. Delton Coombes will see you back in the office after the biopsy results come in.   Thank you for choosing [place] at [facility] to provide your oncology and hematology care.  To afford each patient quality time with our provider, please arrive at least 15 minutes before your scheduled appointment time.   If you have a lab appointment with the [facility] please come in thru the Main Entrance and check in at the main information desk  You need to re-schedule your appointment should you arrive 10 or more minutes late.  We strive to give you quality time with our providers, and arriving late affects you and other patients whose appointments are after yours.  Also, if you no show three or more times for appointments you may be dismissed from the clinic at the providers discretion.     Again, thank you for choosing [facility].  Our hope is that these requests will decrease the amount of time that you wait before being seen by our physicians.       _____________________________________________________________  Should you have questions after your visit to [facility], please contact our office at [phone] between the hours of 8:00 a.m. and 4:30 p.m.  Voicemails left after 4:00 p.m. will not be returned until the following business day.  For prescription refill requests, have your pharmacy contact our office and allow 72 hours.    Cancer Center Support Programs:   > Cancer Support Group  2nd Tuesday of the month 1pm-2pm, Journey Room

## 2019-07-11 LAB — AEROBIC/ANAEROBIC CULTURE W GRAM STAIN (SURGICAL/DEEP WOUND)
Culture: NO GROWTH
Gram Stain: NONE SEEN

## 2019-07-18 ENCOUNTER — Institutional Professional Consult (permissible substitution) (INDEPENDENT_AMBULATORY_CARE_PROVIDER_SITE_OTHER): Payer: 59 | Admitting: Thoracic Surgery (Cardiothoracic Vascular Surgery)

## 2019-07-18 ENCOUNTER — Other Ambulatory Visit: Payer: Self-pay | Admitting: *Deleted

## 2019-07-18 ENCOUNTER — Other Ambulatory Visit: Payer: Self-pay

## 2019-07-18 ENCOUNTER — Encounter: Payer: Self-pay | Admitting: Thoracic Surgery (Cardiothoracic Vascular Surgery)

## 2019-07-18 VITALS — BP 141/83 | HR 75 | Temp 98.1°F | Resp 16 | Ht 67.0 in | Wt 203.0 lb

## 2019-07-18 DIAGNOSIS — R590 Localized enlarged lymph nodes: Secondary | ICD-10-CM | POA: Diagnosis not present

## 2019-07-18 NOTE — H&P (View-Only) (Signed)
PCP is Sasser, Silvestre Moment, MD Referring Provider is Derek Jack, MD  Chief Complaint  Patient presents with   Lung Lesion    Consultation; chest CTA 06/01/19, PET 06/20/19    HPI: Mathew Moore sent for consultation regarding mediastinal and bilateral hilar adenopathy.  Mathew Moore is a 54 year old man with a history of hypertension and reflux.  He is a lifelong non-smoker.  He was in his usual state of health until late April of this year.  He presented to the emergency room with chest pain.  He also was having problems with dizziness.  He ruled out for an acute coronary syndrome.  A CT angiogram of the chest was done.  There is no aortic pathology but he was found to have mediastinal and bilateral hilar adenopathy.  Subsequently he had a PET/CT which showed the nodes were hypermetabolic.  He had a bronchoscopy and endobronchial ultrasound which was nondiagnostic.  He has frequent reflux.  He still has some chest discomfort and sensation of tightness in his chest with exertion.  He had had a cardiac catheterization about a year ago which showed no significant coronary disease.  He complains of decreased energy and a loss of appetite.  He is lost about 12 pounds in the past 3 months.  He also complains of a dry cough.  He has been under a lot of stress and has been very anxious about his diagnosis.  He denies fevers, chills, or night sweats.  Zubrod Score: At the time of surgery this patients most appropriate activity status/level should be described as: [x]     0    Normal activity, no symptoms [x]     1    Restricted in physical strenuous activity but ambulatory, able to do out light work []     2    Ambulatory and capable of self care, unable to do work activities, up and about >50 % of waking hours                              []     3    Only limited self care, in bed greater than 50% of waking hours []     4    Completely disabled, no self care, confined to bed or chair []     5     Moribund  Past Medical History:  Diagnosis Date   GERD (gastroesophageal reflux disease)    Hypertension     Past Surgical History:  Procedure Laterality Date   CLEFT LIP REPAIR     COLONOSCOPY     HERNIA REPAIR     LEFT HEART CATH AND CORONARY ANGIOGRAPHY N/A 08/23/2018   Procedure: LEFT HEART CATH AND CORONARY ANGIOGRAPHY;  Surgeon: Burnell Blanks, MD;  Location: [facility];  Service: Cardiovascular;  Laterality: N/A;   NASAL SEPTUM SURGERY     PLANTAR FASCIA SURGERY Right    VIDEO BRONCHOSCOPY WITH ENDOBRONCHIAL ULTRASOUND N/A 07/06/2019   Procedure: VIDEO BRONCHOSCOPY WITH ENDOBRONCHIAL ULTRASOUND;  Surgeon: Marshell Garfinkel, MD;  Location: [facility];  Service: Pulmonary;  Laterality: N/A;    Family History  Problem Relation Age of Onset   Breast cancer Mother    Heart disease Father    Lung cancer Father     Social History Social History   Tobacco Use   Smoking status: Never Smoker   Smokeless tobacco: Never Used  Vaping Use   Vaping Use: Never assessed  Substance Use  Topics   Alcohol use: Not Currently   Drug use: Not Currently    Current Outpatient Medications  Medication Sig Dispense Refill   acetaminophen (TYLENOL) 500 MG tablet Take 1,000 mg by mouth every 6 (six) hours as needed for moderate pain or headache.     alum & mag hydroxide-simeth (MAALOX/MYLANTA) 200-200-20 MG/5ML suspension Take 15 mLs by mouth as needed for indigestion or heartburn.     calcium carbonate (TUMS - DOSED IN MG ELEMENTAL CALCIUM) 500 MG chewable tablet Chew 2 tablets by mouth every 4 (four) hours as needed for indigestion or heartburn.     citalopram (CELEXA) 20 MG tablet Take 20 mg by mouth daily.     clonazePAM (KLONOPIN) 0.5 MG tablet Take 0.5 mg by mouth 3 (three) times daily as needed for anxiety.     losartan (COZAAR) 25 MG tablet Take 25 mg by mouth daily.     zolpidem (AMBIEN) 10 MG tablet Take 10 mg by mouth at bedtime.      Current  Facility-Administered Medications  Medication Dose Route Frequency Provider Last Rate Last Admin   sodium chloride flush (NS) 0.9 % injection 3 mL  3 mL Intravenous Q12H Mathew Hector, MD        No Known Allergies  Review of Systems  Constitutional: Positive for activity change, appetite change, fatigue and unexpected weight change. Negative for chills and fever.  HENT: Negative for trouble swallowing and voice change.   Respiratory: Positive for cough and shortness of breath.   Cardiovascular: Positive for chest pain and palpitations. Negative for leg swelling.  Gastrointestinal: Positive for abdominal pain.  Neurological: Positive for dizziness. Negative for syncope.  Hematological: Positive for adenopathy (By scans). Does not bruise/bleed easily.  Psychiatric/Behavioral: The patient is nervous/anxious.   All other systems reviewed and are negative.   BP (!) 141/83 (BP Location: Right Arm, Patient Position: Sitting, Cuff Size: Normal)    Pulse 75    Temp 98.1 F (36.7 C) (Temporal)    Resp 16    Ht 5\' 7"  (1.702 m)    Wt 203 lb (92.1 kg)    SpO2 96% Comment: RA   BMI 31.79 kg/m  Physical Exam Vitals reviewed.  Constitutional:      General: He is not in acute distress.    Appearance: Normal appearance.  HENT:     Head: Normocephalic and atraumatic.  Eyes:     General: No scleral icterus.    Extraocular Movements: Extraocular movements intact.  Neck:     Vascular: No carotid bruit.  Cardiovascular:     Rate and Rhythm: Normal rate and regular rhythm.     Pulses: Normal pulses.     Heart sounds: Normal heart sounds. No murmur heard.  No friction rub. No gallop.   Pulmonary:     Effort: Pulmonary effort is normal. No respiratory distress.     Breath sounds: Normal breath sounds. No wheezing or rales.  Abdominal:     General: There is no distension.     Palpations: Abdomen is soft.     Tenderness: There is no abdominal tenderness.  Musculoskeletal:     Cervical back:  Neck supple.  Lymphadenopathy:     Cervical: No cervical adenopathy.  Skin:    General: Skin is warm and dry.  Neurological:     General: No focal deficit present.     Mental Status: He is alert and oriented to person, place, and time.     Cranial Nerves: No  cranial nerve deficit.     Motor: No weakness.      Diagnostic Tests: CT ANGIOGRAPHY CHEST, ABDOMEN AND PELVIS  TECHNIQUE: Non-contrast CT of the chest was initially obtained.  Multidetector CT imaging through the chest, abdomen and pelvis was performed using the standard protocol during bolus administration of intravenous contrast. Multiplanar reconstructed images and MIPs were obtained and reviewed to evaluate the vascular anatomy.  CONTRAST:  156mL OMNIPAQUE IOHEXOL 350 MG/ML SOLN  COMPARISON:  None.  FINDINGS: CTA CHEST FINDINGS  Cardiovascular: Preferential opacification of the thoracic aorta. Normal contour and caliber of the thoracic aorta. No evidence of aneurysm, dissection, or other acute aortic syndrome. Scattered aortic atherosclerosis. Normal heart size. Three-vessel coronary artery calcifications. No pericardial effusion.  Mediastinum/Nodes: There are numerous enlarged mediastinal and bilateral hilar lymph nodes, largest pretracheal lymph nodes measuring up to 2.4 x 2.4 cm (series 5, image 35). Thyroid gland, trachea, and esophagus demonstrate no significant findings.  Lungs/Pleura: Lungs are clear. There is a nonspecific 7 mm fissural nodule of the left upper lobe (series 7, image 46). No pleural effusion or pneumothorax.  Musculoskeletal: No chest wall abnormality. No acute or significant osseous findings.  Review of the MIP images confirms the above findings.  CTA ABDOMEN AND PELVIS FINDINGS  VASCULAR  Normal contour and caliber of the abdominal aorta. No evidence of aneurysm, dissection, or other acute aortic syndrome. Standard branching pattern of the abdominal aorta, with  solitary bilateral renal arteries. Mild mixed calcific atherosclerosis.  Review of the MIP images confirms the above findings.  NON-VASCULAR  Hepatobiliary: No solid liver abnormality is seen. Hepatic steatosis. No gallstones, gallbladder wall thickening, or biliary dilatation.  Pancreas: Unremarkable. No pancreatic ductal dilatation or surrounding inflammatory changes.  Spleen: Normal in size without significant abnormality.  Adrenals/Urinary Tract: Adrenal glands are unremarkable. Kidneys are normal, without renal calculi, solid lesion, or hydronephrosis. Bladder is unremarkable.  Stomach/Bowel: Stomach is within normal limits. Appendix appears normal. There is hazy fat stranding of the central small bowel mesentery in the left hemiabdomen with prominent although not pathologically enlarged lymph nodes (series 5, image 131).  Lymphatic: No enlarged abdominal or pelvic lymph nodes.  Reproductive: No mass or other significant abnormality.  Other: No abdominal wall hernia or abnormality. No abdominopelvic ascites.  Musculoskeletal: No acute or significant osseous findings.  Review of the MIP images confirms the above findings.  IMPRESSION: 1. Normal contour and caliber of the thoracic and abdominal aorta without evidence of aneurysm, dissection, or other acute aortic syndrome. Mild atherosclerosis. Aortic Atherosclerosis (ICD10-I70.0).  2. Numerous enlarged mediastinal and bilateral hilar lymph nodes, of uncertain nature although concerning for malignancy given size, including metastatic disease or lymphoma. Non malignant differential considerations include sarcoidosis. These may be characterized for metabolic activity by FDG PET.  3. Nonspecific 7 mm fissural pulmonary nodule of the left upper lobe. This nodule may be characterized for metabolic activity by PET-CT in conjunction with above.  4.  Coronary artery disease.  5.  Hepatic  steatosis.   Electronically Signed   By: Eddie Candle M.D.   On: 06/01/2019 14:08 NUCLEAR MEDICINE PET SKULL BASE TO THIGH  TECHNIQUE: 10.2 mCi F-18 FDG was injected intravenously. Full-ring PET imaging was performed from the skull base to thigh after the radiotracer. CT data was obtained and used for attenuation correction and anatomic localization.  Fasting blood glucose: 97 mg/dl  COMPARISON:  Insert CT a chest 06/01/2019  FINDINGS: Mediastinal blood pool activity: SUV max 2.0  Liver activity: SUV max  NA  NECK: No hypermetabolic lymph nodes in the neck.  Incidental CT findings: none  CHEST: Mediastinal and hilar lymphadenopathy is markedly hypermetabolic. 2.2 cm right paratracheal node on 71/4 shows SUV max = 10.3. 2.4 cm short axis subcarinal node has SUV max = 14. Hypermetabolic left hilar lymphadenopathy demonstrates SUV max = 15.4 with hypermetabolic disease in the right hilum demonstrating SUV max = 15.6.  7 mm posterior left upper lobe perifissural nodule (image 19/series 8) is hypermetabolic with SUV max = 4.6  Ill-defined posterior right lower lobe nodular opacity measuring 1.8 cm (39/8) shows low level FDG accumulation and may be atelectatic or infectious/inflammatory.  Incidental CT findings: Heart size upper normal to mildly increased. Coronary artery calcification is evident. Atherosclerotic calcification is noted in the wall of the thoracic aorta.  ABDOMEN/PELVIS: No abnormal hypermetabolic activity within the liver, pancreas, adrenal glands, or spleen. No hypermetabolic lymph nodes in the abdomen or pelvis.  Incidental CT findings: none  SKELETON: No focal hypermetabolic activity to suggest skeletal metastasis.  Incidental CT findings: none  IMPRESSION: 1. Markedly hypermetabolic mediastinal and bilateral hilar lymphadenopathy. Imaging features consistent with neoplasm. Metastatic disease and lymphoma primary diagnostic  considerations. 2. 7 mm hypermetabolic posterior left upper lobe pulmonary nodule. This could be metastatic disease or primary bronchogenic neoplasm. 3. No evidence for unexpected hypermetabolic disease in the neck, abdomen, or pelvis.   Electronically Signed   By: Misty Stanley M.D.   On: 06/20/2019 09:19 I personally reviewed the CT and PET/CT images and concur with the findings noted above.  Impression: Mathew Moore is a 54 year old non-smoker with a history of reflux and hypertension who presented with chest pain.  He ruled out for an acute coronary syndrome.  A CT angiogram showed no evidence of pulmonary embolus or aortic pathology, but did reveal bilateral hilar and mediastinal adenopathy.  PET/CT confirmed the adenopathy and showed it was hypermetabolic.  Differential diagnosis includes lymphoma, sarcoidosis, metastatic carcinoma.  He had an endobronchial ultrasound which was negative so metastatic carcinoma is less likely but not completely ruled out.  It most likely is either lymphoma and sarcoidosis.  I recommended we proceed with mediastinoscopy for diagnostic purposes.  I informed Mathew Moore of the general nature of the procedure including the need for general anesthesia and the incision to be used.  I informed him of the indications, risks, benefits, and alternatives.  They understand this is diagnostic and not therapeutic.  They understand the risks include, but are not limited to death, MI, DVT, PE, bleeding, possible need for transfusion, possible need for sternotomy or thoracotomy, infection, recurrent nerve injury with hoarseness, pneumothorax, esophageal injury, and in rare instances stroke.  They understand there is also a possibility of other unforeseeable complications.  He accepts the risks and wishes to proceed.  Plan: Mediastinoscopy on Friday, 07/21/2019  Melrose Nakayama, MD [facility] [phone]

## 2019-07-18 NOTE — Progress Notes (Signed)
PCP is Sasser, Silvestre Moment, MD Referring Provider is Derek Jack, MD  Chief Complaint  Patient presents with  . Lung Lesion    Consultation; chest CTA 06/01/19, PET 06/20/19    HPI: Mathew Moore sent for consultation regarding mediastinal and bilateral hilar adenopathy.  Mathew Moore is a 54 year old man with a history of hypertension and reflux.  He is a lifelong non-smoker.  He was in his usual state of health until late April of this year.  He presented to the emergency room with chest pain.  He also was having problems with dizziness.  He ruled out for an acute coronary syndrome.  A CT angiogram of the chest was done.  There is no aortic pathology but he was found to have mediastinal and bilateral hilar adenopathy.  Subsequently he had a PET/CT which showed the nodes were hypermetabolic.  He had a bronchoscopy and endobronchial ultrasound which was nondiagnostic.  He has frequent reflux.  He still has some chest discomfort and sensation of tightness in his chest with exertion.  He had had a cardiac catheterization about a year ago which showed no significant coronary disease.  He complains of decreased energy and a loss of appetite.  He is lost about 12 pounds in the past 3 months.  He also complains of a dry cough.  He has been under a lot of stress and has been very anxious about his diagnosis.  He denies fevers, chills, or night sweats.  Zubrod Score: At the time of surgery this patient's most appropriate activity status/level should be described as: [x]     0    Normal activity, no symptoms [x]     1    Restricted in physical strenuous activity but ambulatory, able to do out light work []     2    Ambulatory and capable of self care, unable to do work activities, up and about >50 % of waking hours                              []     3    Only limited self care, in bed greater than 50% of waking hours []     4    Completely disabled, no self care, confined to bed or chair []     5     Moribund  Past Medical History:  Diagnosis Date  . GERD (gastroesophageal reflux disease)   . Hypertension     Past Surgical History:  Procedure Laterality Date  . CLEFT LIP REPAIR    . COLONOSCOPY    . HERNIA REPAIR    . LEFT HEART CATH AND CORONARY ANGIOGRAPHY N/A 08/23/2018   Procedure: LEFT HEART CATH AND CORONARY ANGIOGRAPHY;  Surgeon: Burnell Blanks, MD;  Location: [facility];  Service: Cardiovascular;  Laterality: N/A;  . NASAL SEPTUM SURGERY    . PLANTAR FASCIA SURGERY Right   . VIDEO BRONCHOSCOPY WITH ENDOBRONCHIAL ULTRASOUND N/A 07/06/2019   Procedure: VIDEO BRONCHOSCOPY WITH ENDOBRONCHIAL ULTRASOUND;  Surgeon: Marshell Garfinkel, MD;  Location: [facility];  Service: Pulmonary;  Laterality: N/A;    Family History  Problem Relation Age of Onset  . Breast cancer Mother   . Heart disease Father   . Lung cancer Father     Social History Social History   Tobacco Use  . Smoking status: Never Smoker  . Smokeless tobacco: Never Used  Vaping Use  . Vaping Use: Never assessed  Substance Use  Topics  . Alcohol use: Not Currently  . Drug use: Not Currently    Current Outpatient Medications  Medication Sig Dispense Refill  . acetaminophen (TYLENOL) 500 MG tablet Take 1,000 mg by mouth every 6 (six) hours as needed for moderate pain or headache.    Marland Kitchen alum & mag hydroxide-simeth (MAALOX/MYLANTA) 200-200-20 MG/5ML suspension Take 15 mLs by mouth as needed for indigestion or heartburn.    . calcium carbonate (TUMS - DOSED IN MG ELEMENTAL CALCIUM) 500 MG chewable tablet Chew 2 tablets by mouth every 4 (four) hours as needed for indigestion or heartburn.    . citalopram (CELEXA) 20 MG tablet Take 20 mg by mouth daily.    . clonazePAM (KLONOPIN) 0.5 MG tablet Take 0.5 mg by mouth 3 (three) times daily as needed for anxiety.    Marland Kitchen losartan (COZAAR) 25 MG tablet Take 25 mg by mouth daily.    Marland Kitchen zolpidem (AMBIEN) 10 MG tablet Take 10 mg by mouth at bedtime.      Current  Facility-Administered Medications  Medication Dose Route Frequency Provider Last Rate Last Admin  . sodium chloride flush (NS) 0.9 % injection 3 mL  3 mL Intravenous Q12H Josue Hector, MD        No Known Allergies  Review of Systems  Constitutional: Positive for activity change, appetite change, fatigue and unexpected weight change. Negative for chills and fever.  HENT: Negative for trouble swallowing and voice change.   Respiratory: Positive for cough and shortness of breath.   Cardiovascular: Positive for chest pain and palpitations. Negative for leg swelling.  Gastrointestinal: Positive for abdominal pain.  Neurological: Positive for dizziness. Negative for syncope.  Hematological: Positive for adenopathy (By scans). Does not bruise/bleed easily.  Psychiatric/Behavioral: The patient is nervous/anxious.   All other systems reviewed and are negative.   BP (!) 141/83 (BP Location: Right Arm, Patient Position: Sitting, Cuff Size: Normal)   Pulse 75   Temp 98.1 F (36.7 C) (Temporal)   Resp 16   Ht 5\' 7"  (1.702 m)   Wt 203 lb (92.1 kg)   SpO2 96% Comment: RA  BMI 31.79 kg/m  Physical Exam Vitals reviewed.  Constitutional:      General: He is not in acute distress.    Appearance: Normal appearance.  HENT:     Head: Normocephalic and atraumatic.  Eyes:     General: No scleral icterus.    Extraocular Movements: Extraocular movements intact.  Neck:     Vascular: No carotid bruit.  Cardiovascular:     Rate and Rhythm: Normal rate and regular rhythm.     Pulses: Normal pulses.     Heart sounds: Normal heart sounds. No murmur heard.  No friction rub. No gallop.   Pulmonary:     Effort: Pulmonary effort is normal. No respiratory distress.     Breath sounds: Normal breath sounds. No wheezing or rales.  Abdominal:     General: There is no distension.     Palpations: Abdomen is soft.     Tenderness: There is no abdominal tenderness.  Musculoskeletal:     Cervical back:  Neck supple.  Lymphadenopathy:     Cervical: No cervical adenopathy.  Skin:    General: Skin is warm and dry.  Neurological:     General: No focal deficit present.     Mental Status: He is alert and oriented to person, place, and time.     Cranial Nerves: No cranial nerve deficit.  Motor: No weakness.      Diagnostic Tests: CT ANGIOGRAPHY CHEST, ABDOMEN AND PELVIS  TECHNIQUE: Non-contrast CT of the chest was initially obtained.  Multidetector CT imaging through the chest, abdomen and pelvis was performed using the standard protocol during bolus administration of intravenous contrast. Multiplanar reconstructed images and MIPs were obtained and reviewed to evaluate the vascular anatomy.  CONTRAST:  162mL OMNIPAQUE IOHEXOL 350 MG/ML SOLN  COMPARISON:  None.  FINDINGS: CTA CHEST FINDINGS  Cardiovascular: Preferential opacification of the thoracic aorta. Normal contour and caliber of the thoracic aorta. No evidence of aneurysm, dissection, or other acute aortic syndrome. Scattered aortic atherosclerosis. Normal heart size. Three-vessel coronary artery calcifications. No pericardial effusion.  Mediastinum/Nodes: There are numerous enlarged mediastinal and bilateral hilar lymph nodes, largest pretracheal lymph nodes measuring up to 2.4 x 2.4 cm (series 5, image 35). Thyroid gland, trachea, and esophagus demonstrate no significant findings.  Lungs/Pleura: Lungs are clear. There is a nonspecific 7 mm fissural nodule of the left upper lobe (series 7, image 46). No pleural effusion or pneumothorax.  Musculoskeletal: No chest wall abnormality. No acute or significant osseous findings.  Review of the MIP images confirms the above findings.  CTA ABDOMEN AND PELVIS FINDINGS  VASCULAR  Normal contour and caliber of the abdominal aorta. No evidence of aneurysm, dissection, or other acute aortic syndrome. Standard branching pattern of the abdominal aorta, with  solitary bilateral renal arteries. Mild mixed calcific atherosclerosis.  Review of the MIP images confirms the above findings.  NON-VASCULAR  Hepatobiliary: No solid liver abnormality is seen. Hepatic steatosis. No gallstones, gallbladder wall thickening, or biliary dilatation.  Pancreas: Unremarkable. No pancreatic ductal dilatation or surrounding inflammatory changes.  Spleen: Normal in size without significant abnormality.  Adrenals/Urinary Tract: Adrenal glands are unremarkable. Kidneys are normal, without renal calculi, solid lesion, or hydronephrosis. Bladder is unremarkable.  Stomach/Bowel: Stomach is within normal limits. Appendix appears normal. There is hazy fat stranding of the central small bowel mesentery in the left hemiabdomen with prominent although not pathologically enlarged lymph nodes (series 5, image 131).  Lymphatic: No enlarged abdominal or pelvic lymph nodes.  Reproductive: No mass or other significant abnormality.  Other: No abdominal wall hernia or abnormality. No abdominopelvic ascites.  Musculoskeletal: No acute or significant osseous findings.  Review of the MIP images confirms the above findings.  IMPRESSION: 1. Normal contour and caliber of the thoracic and abdominal aorta without evidence of aneurysm, dissection, or other acute aortic syndrome. Mild atherosclerosis. Aortic Atherosclerosis (ICD10-I70.0).  2. Numerous enlarged mediastinal and bilateral hilar lymph nodes, of uncertain nature although concerning for malignancy given size, including metastatic disease or lymphoma. Non malignant differential considerations include sarcoidosis. These may be characterized for metabolic activity by FDG PET.  3. Nonspecific 7 mm fissural pulmonary nodule of the left upper lobe. This nodule may be characterized for metabolic activity by PET-CT in conjunction with above.  4.  Coronary artery disease.  5.  Hepatic  steatosis.   Electronically Signed   By: Eddie Candle M.D.   On: 06/01/2019 14:08 NUCLEAR MEDICINE PET SKULL BASE TO THIGH  TECHNIQUE: 10.2 mCi F-18 FDG was injected intravenously. Full-ring PET imaging was performed from the skull base to thigh after the radiotracer. CT data was obtained and used for attenuation correction and anatomic localization.  Fasting blood glucose: 97 mg/dl  COMPARISON:  Insert CT a chest 06/01/2019  FINDINGS: Mediastinal blood pool activity: SUV max 2.0  Liver activity: SUV max NA  NECK: No hypermetabolic lymph nodes  in the neck.  Incidental CT findings: none  CHEST: Mediastinal and hilar lymphadenopathy is markedly hypermetabolic. 2.2 cm right paratracheal node on 71/4 shows SUV max = 10.3. 2.4 cm short axis subcarinal node has SUV max = 14. Hypermetabolic left hilar lymphadenopathy demonstrates SUV max = 15.4 with hypermetabolic disease in the right hilum demonstrating SUV max = 15.6.  7 mm posterior left upper lobe perifissural nodule (image 19/series 8) is hypermetabolic with SUV max = 4.6  Ill-defined posterior right lower lobe nodular opacity measuring 1.8 cm (39/8) shows low level FDG accumulation and may be atelectatic or infectious/inflammatory.  Incidental CT findings: Heart size upper normal to mildly increased. Coronary artery calcification is evident. Atherosclerotic calcification is noted in the wall of the thoracic aorta.  ABDOMEN/PELVIS: No abnormal hypermetabolic activity within the liver, pancreas, adrenal glands, or spleen. No hypermetabolic lymph nodes in the abdomen or pelvis.  Incidental CT findings: none  SKELETON: No focal hypermetabolic activity to suggest skeletal metastasis.  Incidental CT findings: none  IMPRESSION: 1. Markedly hypermetabolic mediastinal and bilateral hilar lymphadenopathy. Imaging features consistent with neoplasm. Metastatic disease and lymphoma primary diagnostic  considerations. 2. 7 mm hypermetabolic posterior left upper lobe pulmonary nodule. This could be metastatic disease or primary bronchogenic neoplasm. 3. No evidence for unexpected hypermetabolic disease in the neck, abdomen, or pelvis.   Electronically Signed   By: Misty Stanley M.D.   On: 06/20/2019 09:19 I personally reviewed the CT and PET/CT images and concur with the findings noted above.  Impression: Mathew Moore is a 54 year old non-smoker with a history of reflux and hypertension who presented with chest pain.  He ruled out for an acute coronary syndrome.  A CT angiogram showed no evidence of pulmonary embolus or aortic pathology, but did reveal bilateral hilar and mediastinal adenopathy.  PET/CT confirmed the adenopathy and showed it was hypermetabolic.  Differential diagnosis includes lymphoma, sarcoidosis, metastatic carcinoma.  He had an endobronchial ultrasound which was negative so metastatic carcinoma is less likely but not completely ruled out.  It most likely is either lymphoma and sarcoidosis.  I recommended we proceed with mediastinoscopy for diagnostic purposes.  I informed Mr. Mrs. Drumwright of the general nature of the procedure including the need for general anesthesia and the incision to be used.  I informed him of the indications, risks, benefits, and alternatives.  They understand this is diagnostic and not therapeutic.  They understand the risks include, but are not limited to death, MI, DVT, PE, bleeding, possible need for transfusion, possible need for sternotomy or thoracotomy, infection, recurrent nerve injury with hoarseness, pneumothorax, esophageal injury, and in rare instances stroke.  They understand there is also a possibility of other unforeseeable complications.  He accepts the risks and wishes to proceed.  Plan: Mediastinoscopy on Friday, 07/21/2019  Melrose Nakayama, MD [facility] [phone]

## 2019-07-19 ENCOUNTER — Other Ambulatory Visit (HOSPITAL_COMMUNITY)
Admission: RE | Admit: 2019-07-19 | Discharge: 2019-07-19 | Disposition: A | Discharge status: Home / Self Care | Payer: 59 | Source: Ambulatory Visit | Attending: Thoracic Surgery (Cardiothoracic Vascular Surgery) | Admitting: Thoracic Surgery (Cardiothoracic Vascular Surgery)

## 2019-07-19 ENCOUNTER — Encounter (HOSPITAL_COMMUNITY): Payer: Self-pay | Admitting: Thoracic Surgery (Cardiothoracic Vascular Surgery)

## 2019-07-19 ENCOUNTER — Other Ambulatory Visit: Payer: Self-pay

## 2019-07-19 DIAGNOSIS — Z01812 Encounter for preprocedural laboratory examination: Secondary | ICD-10-CM | POA: Insufficient documentation

## 2019-07-19 DIAGNOSIS — Z20822 Contact with and (suspected) exposure to covid-19: Secondary | ICD-10-CM | POA: Diagnosis not present

## 2019-07-19 DIAGNOSIS — R590 Localized enlarged lymph nodes: Secondary | ICD-10-CM

## 2019-07-19 LAB — SARS CORONAVIRUS 2 (TAT 6-24 HRS): SARS Coronavirus 2: NEGATIVE

## 2019-07-19 NOTE — Progress Notes (Addendum)
[name] was tested for Covid today and has been in quarantine with his family. Patient denies chest pain or shortness of breath.

## 2019-07-20 ENCOUNTER — Encounter (HOSPITAL_COMMUNITY)
Admission: RE | Disposition: A | Discharge status: Home / Self Care | Payer: Self-pay | Source: Home / Self Care | Attending: Thoracic Surgery (Cardiothoracic Vascular Surgery)

## 2019-07-20 ENCOUNTER — Telehealth: Payer: Self-pay

## 2019-07-20 ENCOUNTER — Ambulatory Visit (HOSPITAL_COMMUNITY)
Admission: RE | Admit: 2019-07-20 | Discharge: 2019-07-20 | Disposition: A | Discharge status: Home / Self Care | Payer: 59 | Attending: Thoracic Surgery (Cardiothoracic Vascular Surgery) | Admitting: Thoracic Surgery (Cardiothoracic Vascular Surgery)

## 2019-07-20 ENCOUNTER — Telehealth: Payer: Self-pay | Admitting: Internal Medicine

## 2019-07-20 ENCOUNTER — Ambulatory Visit (HOSPITAL_COMMUNITY): Payer: 59 | Admitting: Anesthesiology

## 2019-07-20 ENCOUNTER — Encounter (HOSPITAL_COMMUNITY): Payer: Self-pay | Admitting: Thoracic Surgery (Cardiothoracic Vascular Surgery)

## 2019-07-20 ENCOUNTER — Ambulatory Visit (HOSPITAL_COMMUNITY): Payer: 59

## 2019-07-20 ENCOUNTER — Other Ambulatory Visit: Payer: Self-pay

## 2019-07-20 ENCOUNTER — Encounter (HOSPITAL_COMMUNITY): Payer: Self-pay

## 2019-07-20 DIAGNOSIS — I10 Essential (primary) hypertension: Secondary | ICD-10-CM | POA: Insufficient documentation

## 2019-07-20 DIAGNOSIS — E669 Obesity, unspecified: Secondary | ICD-10-CM | POA: Insufficient documentation

## 2019-07-20 DIAGNOSIS — K219 Gastro-esophageal reflux disease without esophagitis: Secondary | ICD-10-CM | POA: Diagnosis not present

## 2019-07-20 DIAGNOSIS — Z79899 Other long term (current) drug therapy: Secondary | ICD-10-CM | POA: Insufficient documentation

## 2019-07-20 DIAGNOSIS — Z803 Family history of malignant neoplasm of breast: Secondary | ICD-10-CM | POA: Diagnosis not present

## 2019-07-20 DIAGNOSIS — Z8249 Family history of ischemic heart disease and other diseases of the circulatory system: Secondary | ICD-10-CM | POA: Insufficient documentation

## 2019-07-20 DIAGNOSIS — Z801 Family history of malignant neoplasm of trachea, bronchus and lung: Secondary | ICD-10-CM | POA: Diagnosis not present

## 2019-07-20 DIAGNOSIS — R590 Localized enlarged lymph nodes: Secondary | ICD-10-CM

## 2019-07-20 DIAGNOSIS — F419 Anxiety disorder, unspecified: Secondary | ICD-10-CM | POA: Diagnosis not present

## 2019-07-20 DIAGNOSIS — Z6831 Body mass index (BMI) 31.0-31.9, adult: Secondary | ICD-10-CM | POA: Insufficient documentation

## 2019-07-20 HISTORY — DX: Anxiety disorder, unspecified: F41.9

## 2019-07-20 HISTORY — PX: MEDIASTINOSCOPY: SHX5086

## 2019-07-20 LAB — COMPREHENSIVE METABOLIC PANEL
ALT: 22 U/L (ref 0–44)
AST: 20 U/L (ref 15–41)
Albumin: 4 g/dL (ref 3.5–5.0)
Alkaline Phosphatase: 62 U/L (ref 38–126)
Anion gap: 7 (ref 5–15)
BUN: 14 mg/dL (ref 6–20)
CO2: 27 mmol/L (ref 22–32)
Calcium: 9 mg/dL (ref 8.9–10.3)
Chloride: 105 mmol/L (ref 98–111)
Creatinine, Ser: 0.96 mg/dL (ref 0.61–1.24)
GFR calc Af Amer: >60 mL/min (ref >60)
GFR calc non Af Amer: >60 mL/min (ref >60)
Glucose, Bld: 99 mg/dL (ref 70–99)
Potassium: 3.8 mmol/L (ref 3.5–5.1)
Sodium: 139 mmol/L (ref 135–145)
Total Bilirubin: 0.9 mg/dL (ref 0.3–1.2)
Total Protein: 6.6 g/dL (ref 6.5–8.1)

## 2019-07-20 LAB — APTT: aPTT: 28 seconds (ref 24–36)

## 2019-07-20 LAB — CBC
HCT: 42.7 % (ref 39.0–52.0)
Hemoglobin: 14.2 g/dL (ref 13.0–17.0)
MCH: 28.9 pg (ref 26.0–34.0)
MCHC: 33.3 g/dL (ref 30.0–36.0)
MCV: 86.8 fL (ref 80.0–100.0)
Platelets: 303 10*3/uL (ref 150–400)
RBC: 4.92 MIL/uL (ref 4.22–5.81)
RDW: 13.2 % (ref 11.5–15.5)
WBC: 4.7 10*3/uL (ref 4.0–10.5)
nRBC: 0 % (ref 0.0–0.2)

## 2019-07-20 LAB — PROTIME-INR
INR: 1.1 (ref 0.8–1.2)
Prothrombin Time: 13.8 s (ref 11.4–15.2)

## 2019-07-20 LAB — TYPE AND SCREEN
ABO/RH(D): A POS
Antibody Screen: NEGATIVE

## 2019-07-20 LAB — ABO/RH: ABO/RH(D): A POS

## 2019-07-20 SURGERY — MEDIASTINOSCOPY
Anesthesia: General | Site: Neck

## 2019-07-20 MED ORDER — FENTANYL CITRATE (PF) 100 MCG/2ML IJ SOLN
INTRAMUSCULAR | Status: AC
  Filled 2019-07-20: qty 2

## 2019-07-20 MED ORDER — SUGAMMADEX SODIUM 200 MG/2ML IV SOLN
INTRAVENOUS | Status: DC | PRN
  Administered 2019-07-20: 200 mg via INTRAVENOUS

## 2019-07-20 MED ORDER — LIDOCAINE 2% (20 MG/ML) 5 ML SYRINGE
INTRAMUSCULAR | Status: AC
  Filled 2019-07-20: qty 5

## 2019-07-20 MED ORDER — CHLORHEXIDINE GLUCONATE 0.12 % MT SOLN
15.0000 mL | Freq: Once | OROMUCOSAL | Status: AC
  Administered 2019-07-20: 15 mL via OROMUCOSAL
  Filled 2019-07-20: qty 15

## 2019-07-20 MED ORDER — MIDAZOLAM HCL 2 MG/2ML IJ SOLN
INTRAMUSCULAR | Status: DC | PRN
  Administered 2019-07-20: 2 mg via INTRAVENOUS

## 2019-07-20 MED ORDER — LACTATED RINGERS IV SOLN: INTRAVENOUS | Status: DC

## 2019-07-20 MED ORDER — FENTANYL CITRATE (PF) 250 MCG/5ML IJ SOLN
INTRAMUSCULAR | Status: AC
  Filled 2019-07-20: qty 5

## 2019-07-20 MED ORDER — PHENYLEPHRINE HCL (PRESSORS) 10 MG/ML IV SOLN
INTRAVENOUS | Status: AC
  Filled 2019-07-20: qty 1

## 2019-07-20 MED ORDER — OXYCODONE HCL 5 MG PO TABS
5.0000 mg | ORAL_TABLET | Freq: Once | ORAL | Status: AC | PRN
  Administered 2019-07-20: 5 mg via ORAL

## 2019-07-20 MED ORDER — OXYCODONE HCL 5 MG PO TABS: 5.0000 mg | ORAL_TABLET | Freq: Four times a day (QID) | ORAL | 0 refills | Status: DC | PRN

## 2019-07-20 MED ORDER — ROCURONIUM BROMIDE 10 MG/ML (PF) SYRINGE
PREFILLED_SYRINGE | INTRAVENOUS | Status: DC | PRN
  Administered 2019-07-20: 60 mg via INTRAVENOUS

## 2019-07-20 MED ORDER — LIDOCAINE 2% (20 MG/ML) 5 ML SYRINGE
INTRAMUSCULAR | Status: DC | PRN
  Administered 2019-07-20: 80 mg via INTRAVENOUS

## 2019-07-20 MED ORDER — PROPOFOL 10 MG/ML IV BOLUS
INTRAVENOUS | Status: DC | PRN
  Administered 2019-07-20: 200 mg via INTRAVENOUS
  Administered 2019-07-20: 100 mg via INTRAVENOUS

## 2019-07-20 MED ORDER — PROPOFOL 10 MG/ML IV BOLUS
INTRAVENOUS | Status: AC
  Filled 2019-07-20: qty 20

## 2019-07-20 MED ORDER — 0.9 % SODIUM CHLORIDE (POUR BTL) OPTIME
TOPICAL | Status: DC | PRN
  Administered 2019-07-20: 1000 mL

## 2019-07-20 MED ORDER — MIDAZOLAM HCL 2 MG/2ML IJ SOLN
INTRAMUSCULAR | Status: AC
  Filled 2019-07-20: qty 2

## 2019-07-20 MED ORDER — OXYCODONE HCL 5 MG PO TABS
ORAL_TABLET | ORAL | Status: AC
  Filled 2019-07-20: qty 1

## 2019-07-20 MED ORDER — ROCURONIUM BROMIDE 10 MG/ML (PF) SYRINGE
PREFILLED_SYRINGE | INTRAVENOUS | Status: AC
  Filled 2019-07-20: qty 10

## 2019-07-20 MED ORDER — PHENYLEPHRINE HCL-NACL 10-0.9 MG/250ML-% IV SOLN
INTRAVENOUS | Status: DC | PRN
  Administered 2019-07-20: 25 ug/min via INTRAVENOUS

## 2019-07-20 MED ORDER — FENTANYL CITRATE (PF) 250 MCG/5ML IJ SOLN
INTRAMUSCULAR | Status: DC | PRN
  Administered 2019-07-20: 100 ug via INTRAVENOUS
  Administered 2019-07-20: 50 ug via INTRAVENOUS

## 2019-07-20 MED ORDER — ONDANSETRON HCL 4 MG/2ML IJ SOLN
INTRAMUSCULAR | Status: AC
  Filled 2019-07-20: qty 2

## 2019-07-20 MED ORDER — ONDANSETRON HCL 4 MG/2ML IJ SOLN
INTRAMUSCULAR | Status: DC | PRN
  Administered 2019-07-20: 4 mg via INTRAVENOUS

## 2019-07-20 MED ORDER — FENTANYL CITRATE (PF) 100 MCG/2ML IJ SOLN
25.0000 ug | INTRAMUSCULAR | Status: DC | PRN
  Administered 2019-07-20: 50 ug via INTRAVENOUS

## 2019-07-20 MED ORDER — PROMETHAZINE HCL 25 MG/ML IJ SOLN: 6.2500 mg | INTRAMUSCULAR | Status: DC | PRN

## 2019-07-20 MED ORDER — CEFAZOLIN SODIUM-DEXTROSE 2-4 GM/100ML-% IV SOLN
2.0000 g | INTRAVENOUS | Status: AC
  Administered 2019-07-20: 2 g via INTRAVENOUS
  Filled 2019-07-20: qty 100

## 2019-07-20 MED ORDER — OXYCODONE HCL 5 MG/5ML PO SOLN: 5.0000 mg | Freq: Once | ORAL | Status: AC | PRN

## 2019-07-20 MED ORDER — DEXAMETHASONE SODIUM PHOSPHATE 10 MG/ML IJ SOLN
INTRAMUSCULAR | Status: AC
  Filled 2019-07-20: qty 1

## 2019-07-20 MED ORDER — DEXAMETHASONE SODIUM PHOSPHATE 10 MG/ML IJ SOLN
INTRAMUSCULAR | Status: DC | PRN
  Administered 2019-07-20: 5 mg via INTRAVENOUS

## 2019-07-20 SURGICAL SUPPLY — 43 items
ADH SKN CLS APL DERMABOND .7 (GAUZE/BANDAGES/DRESSINGS) ×1
APPLIER CLIP LOGIC TI 5 (MISCELLANEOUS) IMPLANT
APR CLP MED LRG 33X5 (MISCELLANEOUS)
BLADE CLIPPER SURG (BLADE) ×2 IMPLANT
BLADE SURG 15 STRL LF DISP TIS (BLADE) ×1 IMPLANT
BLADE SURG 15 STRL SS (BLADE) ×2
CANISTER SUCT 3000ML PPV (MISCELLANEOUS) ×2 IMPLANT
CLIP VESOCCLUDE MED 6/CT (CLIP) ×2 IMPLANT
CNTNR URN SCR LID CUP LEK RST (MISCELLANEOUS) ×2 IMPLANT
CONT SPEC 4OZ STRL OR WHT (MISCELLANEOUS) ×4
COVER SURGICAL LIGHT HANDLE (MISCELLANEOUS) ×4 IMPLANT
DERMABOND ADVANCED (GAUZE/BANDAGES/DRESSINGS) ×1
DERMABOND ADVANCED .7 DNX12 (GAUZE/BANDAGES/DRESSINGS) ×1 IMPLANT
DRAPE CHEST BREAST 15X10 FENES (DRAPES) ×2 IMPLANT
ELECT REM PT RETURN 9FT ADLT (ELECTROSURGICAL) ×2
ELECTRODE REM PT RTRN 9FT ADLT (ELECTROSURGICAL) ×1 IMPLANT
GAUZE 4X4 16PLY RFD (DISPOSABLE) ×2 IMPLANT
GLOVE BIOGEL PI IND STRL 7.0 (GLOVE) IMPLANT
GLOVE BIOGEL PI INDICATOR 7.0 (GLOVE) ×1
GLOVE SURG SIGNA 7.5 PF LTX (GLOVE) ×2 IMPLANT
GLOVE TRIUMPH SURG SIZE 7.5 (KITS) ×1 IMPLANT
GOWN STRL REUS W/ TWL LRG LVL3 (GOWN DISPOSABLE) ×1 IMPLANT
GOWN STRL REUS W/ TWL XL LVL3 (GOWN DISPOSABLE) ×1 IMPLANT
GOWN STRL REUS W/TWL LRG LVL3 (GOWN DISPOSABLE) ×2
GOWN STRL REUS W/TWL XL LVL3 (GOWN DISPOSABLE) ×2
HEMOSTAT SURGICEL 2X14 (HEMOSTASIS) IMPLANT
KIT BASIN OR (CUSTOM PROCEDURE TRAY) ×2 IMPLANT
KIT TURNOVER KIT B (KITS) ×2 IMPLANT
NS IRRIG 1000ML POUR BTL (IV SOLUTION) ×2 IMPLANT
PACK GENERAL/GYN (CUSTOM PROCEDURE TRAY) ×2 IMPLANT
PAD ARMBOARD 7.5X6 YLW CONV (MISCELLANEOUS) ×4 IMPLANT
PENCIL BUTTON HOLSTER BLD 10FT (ELECTRODE) ×1 IMPLANT
SPONGE INTESTINAL PEANUT (DISPOSABLE) IMPLANT
SUT SILK 2 0 (SUTURE)
SUT SILK 2-0 18XBRD TIE 12 (SUTURE) IMPLANT
SUT VIC AB 2-0 CT1 27 (SUTURE)
SUT VIC AB 2-0 CT1 TAPERPNT 27 (SUTURE) IMPLANT
SUT VIC AB 3-0 SH 18 (SUTURE) ×2 IMPLANT
SUT VICRYL 4-0 PS2 18IN ABS (SUTURE) ×2 IMPLANT
SYR 10ML LL (SYRINGE) ×2 IMPLANT
TOWEL GREEN STERILE (TOWEL DISPOSABLE) ×2 IMPLANT
TOWEL GREEN STERILE FF (TOWEL DISPOSABLE) ×2 IMPLANT
WATER STERILE IRR 1000ML POUR (IV SOLUTION) ×2 IMPLANT

## 2019-07-20 NOTE — Interval H&P Note (Signed)
History and Physical Interval Note:  07/20/2019 9:17 AM  Mathew Moore  has presented today for surgery, with the diagnosis of MEDIASTINAL ADENOPATHY.  The various methods of treatment have been discussed with the patient and family. After consideration of risks, benefits and other options for treatment, the patient has consented to  Procedure(s): MEDIASTINOSCOPY (N/A) as a surgical intervention.  The patient's history has been reviewed, patient examined, no change in status, stable for surgery.  I have reviewed the patient's chart and labs.  Questions were answered to the patient's satisfaction.     Melrose Nakayama

## 2019-07-20 NOTE — Anesthesia Procedure Notes (Signed)
Procedure Name: Intubation Date/Time: 07/20/2019 10:14 AM Performed by: Kathryne Hitch, CRNA Pre-anesthesia Checklist: Patient identified, Emergency Drugs available, Suction available and Patient being monitored Patient Re-evaluated:Patient Re-evaluated prior to induction Oxygen Delivery Method: Circle system utilized Preoxygenation: Pre-oxygenation with 100% oxygen Induction Type: IV induction Ventilation: Mask ventilation without difficulty Laryngoscope Size: Miller and 3 Grade View: Grade II Tube type: Oral Tube size: 7.5 mm Number of attempts: 1 Airway Equipment and Method: Stylet and Oral airway Placement Confirmation: ETT inserted through vocal cords under direct vision,  positive ETCO2 and breath sounds checked- equal and bilateral Secured at: 23 cm Tube secured with: Tape Dental Injury: Teeth and Oropharynx as per pre-operative assessment

## 2019-07-20 NOTE — Brief Op Note (Signed)
07/20/2019  11:16 AM  PATIENT:  Mathew Moore  54 y.o. male  PRE-OPERATIVE DIAGNOSIS:  MEDIASTINAL ADENOPATHY  POST-OPERATIVE DIAGNOSIS:  MEDIASTINAL ADENOPATHY  PROCEDURE:  Procedure(s): MEDIASTINOSCOPY (N/A)  SURGEON:  Surgeon(s) and Role:    * Melrose Nakayama, MD - Primary  PHYSICIAN ASSISTANT:   ASSISTANTS: none   ANESTHESIA:   general  EBL:  5 mL   BLOOD ADMINISTERED:none  DRAINS: none   LOCAL MEDICATIONS USED:  NONE  SPECIMEN:  Source of Specimen:  Mediastinal nodes  DISPOSITION OF SPECIMEN:  Path and micro  COUNTS:  YES  TOURNIQUET:  * No tourniquets in log *  DICTATION: .Other Dictation: Dictation Number -  PLAN OF CARE: Discharge to home after PACU  PATIENT DISPOSITION:  PACU - hemodynamically stable.   Delay start of Pharmacological VTE agent (>24hrs) due to surgical blood loss or risk of bleeding: not applicable  FINDINGS- granulomas on Frozen

## 2019-07-20 NOTE — Anesthesia Procedure Notes (Signed)
Arterial Line Insertion Start/End6/18/2021 9:00 AM, 07/20/2019 9:15 AM Performed by: Janace Litten, CRNA, CRNA  Patient location: Pre-op. Preanesthetic checklist: patient identified, IV checked, site marked, risks and benefits discussed, surgical consent, monitors and equipment checked, pre-op evaluation, timeout performed and anesthesia consent Lidocaine 1% used for infiltration Right, radial was placed Catheter size: 20 Fr Hand hygiene performed  and maximum sterile barriers used   Attempts: 1 Procedure performed using ultrasound guided technique. Ultrasound Notes:anatomy identified, needle tip was noted to be adjacent to the nerve/plexus identified and no ultrasound evidence of intravascular and/or intraneural injection Following insertion, dressing applied and Biopatch. Post procedure assessment: normal and unchanged  Patient tolerated the procedure well with no immediate complications.

## 2019-07-20 NOTE — Telephone Encounter (Signed)
Attempted to call pt but unable to reach. Left message for pt to return call. 

## 2019-07-20 NOTE — Op Note (Signed)
NAME: ABED, SCHAR MEDICAL RECORD YT:[id number] ACCOUNT [id number] DATE OF BIRTH:1965-02-27 FACILITY: [facility] LOCATION: [facility] PHYSICIAN:Ashlan Dignan Chaya Jan, MD  OPERATIVE REPORT  DATE OF PROCEDURE:  07/20/2019  PREOPERATIVE DIAGNOSIS:  Mediastinal adenopathy.  POSTOPERATIVE DIAGNOSIS:  Mediastinal adenopathy, granulomatous disease.  PROCEDURE:  Mediastinoscopy.  SURGEON:  Modesto Charon, MD  ASSISTANT:  None  ANESTHESIA:  General.  FINDINGS:  Enlarged 4R nodes.  Frozen section revealed granulomas.  CLINICAL NOTE:  Mr. Antolin is a 54 year old man who is generally healthy.  He is a nonsmoker.  He presented with chest pain and dizziness.  He was found to have mediastinal and bilateral hilar adenopathy.  On PET CT, the nodes were hypermetabolic.   Bronchoscopy and endobronchial ultrasound was nondiagnostic.  He was advised to undergo mediastinoscopy for diagnosis.  The indications, risks, benefits, and alternatives were discussed in detail with the patient.  He understood and accepted the risks  and agreed to proceed.  OPERATIVE NOTE:  The patient was brought to the operating room on 07/20/2019.  He had induction of general anesthesia and was intubated.  Intravenous antibiotics were administered.  Sequential compression devices were placed on the calves for DVT  prophylaxis.  Active warming was in place.  The neck and chest were prepped and draped in the usual sterile fashion.  A timeout was performed.  An incision was made 1 fingerbreadth above the sternal notch.  It was carried through the skin and subcutaneous tissue.  Hemostasis was achieved with cautery.  The strap muscles were separated in the midline.  The pretracheal  fascia was identified and incised and the pretracheal plane was developed bluntly into the mediastinum with fingertip dissection.  The mediastinoscope was inserted.  In the 4R position, there was a whitish granular lymph node.  Biopsies were obtained  and  sent for frozen section.  Additional biopsies were taken and sent for AFB and fungal cultures and finally the remainder of the node was removed and sent for permanent pathology.  After removing that node, a 2nd enlarged 4R node was noted.  This was more  grayish in color, but was even larger than the first.  Multiple biopsies were obtained from this node and sent for permanent pathology only.  The wound was packed with gauze.  The frozen section returned showing granulomas.  The gauze packing was  removed.  There was minimal blood staining.  The mediastinoscope was reinserted.  There was good hemostasis.  The mediastinoscope was withdrawn.  The incision was closed in 2 layers.  Dermabond was applied.  The patient was extubated in the operating  room and taken to the postanesthetic care unit in good condition.  CN/NUANCE  D:07/20/2019 T:07/20/2019 JOB:[id number]

## 2019-07-20 NOTE — Anesthesia Postprocedure Evaluation (Signed)
Anesthesia Post Note  Patient: Mathew Moore  Procedure(s) Performed: MEDIASTINOSCOPY (N/A Neck)     Patient location during evaluation: PACU Anesthesia Type: General Level of consciousness: awake and alert Pain management: pain level controlled Vital Signs Assessment: post-procedure vital signs reviewed and stable Respiratory status: spontaneous breathing, nonlabored ventilation and respiratory function stable Cardiovascular status: blood pressure returned to baseline and stable Postop Assessment: no apparent nausea or vomiting Anesthetic complications: no   No complications documented.  Last Vitals:  Vitals:   07/20/19 1122 07/20/19 1137  BP: 133/84 140/85  Pulse: 69 69  Resp: 14 16  Temp:  36.6 C  SpO2: 99% 98%    Last Pain:  Vitals:   07/20/19 1137  TempSrc:   PainSc: [place]

## 2019-07-20 NOTE — Telephone Encounter (Signed)
[employer]/wage replacement benefit form completed and faxed to [phone] per patient 1st day OOW 07/20/19, approximate RTW date 08/13/19 Form mailed to patient's home address

## 2019-07-20 NOTE — Transfer of Care (Signed)
Immediate Anesthesia Transfer of Care Note  Patient: Mathew Moore  Procedure(s) Performed: MEDIASTINOSCOPY (N/A Neck)  Patient Location: PACU  Anesthesia Type:General  Level of Consciousness: drowsy and patient cooperative  Airway & Oxygen Therapy: Patient Spontanous Breathing and Patient connected to face mask oxygen  Post-op Assessment: Report given to RN and Post -op Vital signs reviewed and stable  Post vital signs: Reviewed and stable  Last Vitals:  Vitals Value Taken Time  BP    Temp    Pulse    Resp    SpO2      Last Pain:  Vitals:   07/20/19 0744  TempSrc:   PainSc: 0-No pain      Patients Stated Pain Goal: 3 (15/17/61 [id number])  Complications: No complications documented.

## 2019-07-20 NOTE — Anesthesia Preprocedure Evaluation (Addendum)
Anesthesia Evaluation  Patient identified by MRN, date of birth, ID band Patient awake    Reviewed: Allergy & Precautions, NPO status , Patient's Chart, lab work & pertinent test results  History of Anesthesia Complications Negative for: history of anesthetic complications  Airway Mallampati: II  TM Distance: >3 FB Neck ROM: Full    Dental  (+) Dental Advisory Given, Teeth Intact   Pulmonary neg pulmonary ROS,    Pulmonary exam normal        Cardiovascular hypertension, Pt. on medications Normal cardiovascular exam   '20 Cath - Mid RCA lesion is 20% stenosed. Mid Cx lesion is 10% stenosed. Mid LAD lesion is 30% stenosed    Neuro/Psych PSYCHIATRIC DISORDERS Anxiety negative neurological ROS     GI/Hepatic Neg liver ROS, GERD  Controlled,  Endo/Other   Obesity   Renal/GU negative Renal ROS     Musculoskeletal negative musculoskeletal ROS (+)   Abdominal   Peds  Hematology negative hematology ROS (+)   Anesthesia Other Findings Covid test negative   Reproductive/Obstetrics                            Anesthesia Physical Anesthesia Plan  ASA: III  Anesthesia Plan: General   Post-op Pain Management:    Induction: Intravenous  PONV Risk Score and Plan: 2 and Treatment may vary due to age or medical condition, Ondansetron, Dexamethasone and Midazolam  Airway Management Planned: Oral ETT  Additional Equipment: Arterial line  Intra-op Plan:   Post-operative Plan: Extubation in OR  Informed Consent: I have reviewed the patients History and Physical, chart, labs and discussed the procedure including the risks, benefits and alternatives for the proposed anesthesia with the patient or authorized representative who has indicated his/her understanding and acceptance.     Dental advisory given  Plan Discussed with: CRNA and Anesthesiologist  Anesthesia Plan Comments:         Anesthesia Quick Evaluation

## 2019-07-20 NOTE — Discharge Instructions (Addendum)
Do not drive or engage in heavy physical activity for 48 hours.  Do not submerge incision in water, but you may shower and wash the incision tomorrow.  You have a medical adhesive over the incision, it will begin to peel off in about 2 weeks.  You may resume driving after you are no longer taking narcotic pain medication.  You have a prescription for oxycodone, a narcotic pain reliever. You may use as directed. You may use acetaminophen (Tylenol) or ibuprofen (Motrin, Advil) in addition to, or instead of the oxycodone.  Call [phone] if you develop chest pain, shortness of breath, fever > 101 F or develop excessive pain, swelling, redness or drainage from the incision.  My office will contact you with a follow up appointment for next week.

## 2019-07-21 ENCOUNTER — Encounter (HOSPITAL_COMMUNITY): Payer: Self-pay | Admitting: Thoracic Surgery (Cardiothoracic Vascular Surgery)

## 2019-07-21 LAB — ACID FAST SMEAR (AFB, MYCOBACTERIA): Acid Fast Smear: NEGATIVE

## 2019-07-23 ENCOUNTER — Other Ambulatory Visit: Payer: Self-pay

## 2019-07-23 ENCOUNTER — Encounter (HOSPITAL_COMMUNITY): Payer: Self-pay | Admitting: *Deleted

## 2019-07-23 NOTE — Progress Notes (Signed)
Patient requested that we send his office notes to his CCM for disability benefits.  Office notes from 5/10, 5/20 and 07/09/19 faxed to Damien Fusi, RN at [phone].

## 2019-07-23 NOTE — Telephone Encounter (Signed)
Pt returning a phone call. Pt can be reached at [phone].

## 2019-07-23 NOTE — Telephone Encounter (Signed)
Spoke with pt. He needs his last OV note and bronch information faxed to his work. These documents have been faxed. Nothing further was needed.

## 2019-07-24 LAB — SURGICAL PATHOLOGY

## 2019-07-25 LAB — AEROBIC/ANAEROBIC CULTURE W GRAM STAIN (SURGICAL/DEEP WOUND): Culture: NO GROWTH

## 2019-07-30 ENCOUNTER — Encounter: Payer: 59 | Admitting: Cardiothoracic Surgery

## 2019-08-07 ENCOUNTER — Ambulatory Visit (HOSPITAL_COMMUNITY): Payer: 59 | Admitting: Hematology

## 2019-08-07 LAB — FUNGUS CULTURE WITH STAIN

## 2019-08-07 LAB — FUNGAL ORGANISM REFLEX

## 2019-08-07 LAB — FUNGUS CULTURE RESULT

## 2019-08-10 ENCOUNTER — Inpatient Hospital Stay (HOSPITAL_COMMUNITY): Payer: 59 | Attending: Hematology | Admitting: Hematology

## 2019-08-10 DIAGNOSIS — Z8249 Family history of ischemic heart disease and other diseases of the circulatory system: Secondary | ICD-10-CM | POA: Diagnosis not present

## 2019-08-10 DIAGNOSIS — Z801 Family history of malignant neoplasm of trachea, bronchus and lung: Secondary | ICD-10-CM | POA: Insufficient documentation

## 2019-08-10 DIAGNOSIS — R634 Abnormal weight loss: Secondary | ICD-10-CM | POA: Insufficient documentation

## 2019-08-10 DIAGNOSIS — R911 Solitary pulmonary nodule: Secondary | ICD-10-CM | POA: Diagnosis not present

## 2019-08-10 DIAGNOSIS — D869 Sarcoidosis, unspecified: Secondary | ICD-10-CM | POA: Diagnosis not present

## 2019-08-10 DIAGNOSIS — R590 Localized enlarged lymph nodes: Secondary | ICD-10-CM | POA: Insufficient documentation

## 2019-08-10 DIAGNOSIS — G479 Sleep disorder, unspecified: Secondary | ICD-10-CM | POA: Insufficient documentation

## 2019-08-10 DIAGNOSIS — Z803 Family history of malignant neoplasm of breast: Secondary | ICD-10-CM | POA: Diagnosis not present

## 2019-08-10 DIAGNOSIS — Z79899 Other long term (current) drug therapy: Secondary | ICD-10-CM | POA: Diagnosis not present

## 2019-08-10 NOTE — Progress Notes (Signed)
[name], [name] [address]   CLINIC:  Medical Oncology/Hematology  PCP:  Manon Hilding, MD [address] [name] [address]  [phone]  REASON FOR VISIT:  Follow-up for sarcoidosis  PRIOR THERAPY: None  CURRENT THERAPY: Under work-up  INTERVAL HISTORY:  [name], a 54 y.o. male, returns for routine follow-up for his sarcoidosis. [name] was last seen on 07/09/2019.  Today he reports feeling well. He has an appointment with a pulmonologist in [place] on 08/14/2019. He denies having any joint pains or nodules.  He denies any sarcoidosis history in his family.   REVIEW OF SYSTEMS:  Review of Systems  Constitutional: Negative for appetite change and fatigue.  Musculoskeletal: Negative for arthralgias.  Hematological: Negative for adenopathy.  Psychiatric/Behavioral: Positive for sleep disturbance. The patient is nervous/anxious.   All other systems reviewed and are negative.   PAST MEDICAL/SURGICAL HISTORY:  Past Medical History:  Diagnosis Date  . Anxiety    ocassional  . GERD (gastroesophageal reflux disease)   . Hypertension    Past Surgical History:  Procedure Laterality Date  . BRONCHOSCOPY    . CLEFT LIP REPAIR    . COLONOSCOPY    . HERNIA REPAIR Left   . LEFT HEART CATH AND CORONARY ANGIOGRAPHY N/A 08/23/2018   Procedure: LEFT HEART CATH AND CORONARY ANGIOGRAPHY;  Surgeon: Burnell Blanks, MD;  Location: [facility];  Service: Cardiovascular;  Laterality: N/A;  . MEDIASTINOSCOPY N/A 07/20/2019   Procedure: MEDIASTINOSCOPY;  Surgeon: Melrose Nakayama, MD;  Location: [facility];  Service: Thoracic;  Laterality: N/A;  . NASAL SEPTUM SURGERY    . PLANTAR FASCIA SURGERY Right   . VIDEO BRONCHOSCOPY WITH ENDOBRONCHIAL ULTRASOUND N/A 07/06/2019   Procedure: VIDEO BRONCHOSCOPY WITH ENDOBRONCHIAL ULTRASOUND;  Surgeon: Marshell Garfinkel, MD;  Location: [facility];  Service: Pulmonary;  Laterality: N/A;     SOCIAL HISTORY:  Social History   Socioeconomic History  . Marital status: Married    Spouse name: [name]  . Number of children: 2  . Years of education: Not on file  . Highest education level: Not on file  Occupational History  . Not on file  Tobacco Use  . Smoking status: Never Smoker  . Smokeless tobacco: Never Used  Vaping Use  . Vaping Use: Never assessed  Substance and Sexual Activity  . Alcohol use: Not Currently  . Drug use: Not Currently  . Sexual activity: Not on file  Other Topics Concern  . Not on file  Social History Narrative  . Not on file   Social Determinants of Health   Financial Resource Strain: Low Risk   . Difficulty of Paying Living Expenses: Not hard at all  Food Insecurity: No Food Insecurity  . Worried About Charity fundraiser in the Last Year: Never true  . Ran Out of Food in the Last Year: Never true  Transportation Needs: No Transportation Needs  . Lack of Transportation (Medical): No  . Lack of Transportation (Non-Medical): No  Physical Activity: Sufficiently Active  . Days of Exercise per Week: 4 days  . Minutes of Exercise per Session: 40 min  Stress: No Stress Concern Present  . Feeling of Stress : Not at all  Social Connections: Moderately Integrated  . Frequency of Communication with Friends and Family: Three times a week  . Frequency of Social Gatherings with Friends and Family: Three times a week  . Attends Religious Services: More  than 4 times per year  . Active Member of Clubs or Organizations: No  . Attends Archivist Meetings: Never  . Marital Status: Married  Human resources officer Violence: Not At Risk  . Fear of Current or Ex-Partner: No  . Emotionally Abused: No  . Physically Abused: No  . Sexually Abused: No    FAMILY HISTORY:  Family History  Problem Relation Age of Onset  . Breast cancer Mother   . Heart disease Father   . Lung cancer Father     CURRENT MEDICATIONS:  Current Outpatient  Medications  Medication Sig Dispense Refill  . acetaminophen (TYLENOL) 500 MG tablet Take 1,000 mg by mouth every 6 (six) hours as needed for moderate pain or headache.    Marland Kitchen alum & mag hydroxide-simeth (MAALOX/MYLANTA) 200-200-20 MG/5ML suspension Take 15 mLs by mouth as needed for indigestion or heartburn.    . calcium carbonate (TUMS - DOSED IN MG ELEMENTAL CALCIUM) 500 MG chewable tablet Chew 2 tablets by mouth every 4 (four) hours as needed for indigestion or heartburn.    . citalopram (CELEXA) 20 MG tablet Take 20 mg by mouth daily.    . clonazePAM (KLONOPIN) 0.5 MG tablet Take 0.5 mg by mouth 3 (three) times daily as needed for anxiety.    Marland Kitchen losartan (COZAAR) 25 MG tablet Take 25 mg by mouth daily.    Marland Kitchen zolpidem (AMBIEN) 10 MG tablet Take 10 mg by mouth at bedtime.      Current Facility-Administered Medications  Medication Dose Route Frequency Provider Last Rate Last Admin  . sodium chloride flush (NS) 0.9 % injection 3 mL  3 mL Intravenous Q12H Josue Hector, MD        ALLERGIES:  No Known Allergies  PHYSICAL EXAM:  Performance status (ECOG): 0 - Asymptomatic  Vitals:   08/10/19 1133  BP: 132/84  Pulse: 66  Resp: 18  Temp: 97.7 F (36.5 C)  SpO2: 99%   Wt Readings from Last 3 Encounters:  08/10/19 207 lb 12.8 oz (94.3 kg)  07/20/19 203 lb (92.1 kg)  07/18/19 203 lb (92.1 kg)   Physical Exam Vitals reviewed.  Constitutional:      Appearance: Normal appearance. He is obese.  Neurological:     General: No focal deficit present.     Mental Status: He is alert and oriented to person, place, and time.  Psychiatric:        Mood and Affect: Mood normal.        Behavior: Behavior normal.     LABORATORY DATA:  I have reviewed the labs as listed.  CBC Latest Ref Rng & Units 07/20/2019 07/06/2019 06/11/2019  WBC 4.0 - 10.5 K/uL 4.7 3.5(L) 3.9(L)  Hemoglobin 13.0 - 17.0 g/dL 14.2 14.3 15.2  Hematocrit 39 - 52 % 42.7 42.8 46.2  Platelets 150 - 400 K/uL 303 300 322   CMP  Latest Ref Rng & Units 07/20/2019 07/06/2019 06/11/2019  Glucose 70 - 99 mg/dL 99 101(H) 99  BUN 6 - 20 mg/dL 14 14 16   Creatinine 0.61 - 1.24 mg/dL 0.96 0.86 0.99  Sodium 135 - 145 mmol/L 139 141 139  Potassium 3.5 - 5.1 mmol/L 3.8 4.2 4.1  Chloride 98 - 111 mmol/L 105 108 102  CO2 22 - 32 mmol/L 27 22 28   Calcium 8.9 - 10.3 mg/dL 9.0 9.1 9.4  Total Protein 6.5 - 8.1 g/dL 6.6 - 7.3  Total Bilirubin 0.3 - 1.2 mg/dL 0.9 - 0.8  Alkaline Phos 38 -  126 U/L 62 - 70  AST 15 - 41 U/L 20 - 19  ALT 0 - 44 U/L 22 - 25      Component Value Date/Time   RBC 4.92 07/20/2019 0716   MCV 86.8 07/20/2019 0716   MCH 28.9 07/20/2019 0716   MCHC 33.3 07/20/2019 0716   RDW 13.2 07/20/2019 0716   LYMPHSABS 1.2 06/11/2019 0929   MONOABS 0.3 06/11/2019 0929   EOSABS 0.1 06/11/2019 0929   BASOSABS 0.1 06/11/2019 0929   Lab Results  Component Value Date   LDH 120 06/11/2019    DIAGNOSTIC IMAGING:  I have independently reviewed the scans and discussed with the patient. DG Chest 2 View  Result Date: 07/20/2019 CLINICAL DATA:  54 year old male preoperative study for mediastinal os could be, mediastinal and hilar lymphadenopathy. EXAM: CHEST - 2 VIEW COMPARISON:  Portable chest 07/06/2019 and earlier. FINDINGS: Stable cardiac size and mediastinal contours. No cardiomegaly. Visualized tracheal air column is within normal limits. Lower lung volumes on the lateral view. Both lungs remain clear. No pneumothorax or pleural effusion. No osseous abnormality identified. Negative visible bowel gas pattern. IMPRESSION: Stable mediastinal contour with underlying lymphadenopathy. Lungs remain clear. Electronically Signed   By: Genevie Ann M.D.   On: 07/20/2019 09:38     ASSESSMENT:  1.  Sarcoidosis of mediastinal and hilar adenopathy: -CT angiogram showed numerous enlarged mediastinal and bilateral hilar lymph nodes with no abdominal or pelvic adenopathy. -No fevers or night sweats.  14 pound weight loss in the last 1 and  half month, trying to lose weight by dietary modification. -Colonoscopy 1 year ago reportedly normal, done in [place]. -Works in [employer].  No specific chemical exposure. -PET scan on 06/20/2019 showed mediastinal and hilar lymphadenopathy markedly hypermetabolic.  7 mm posterior left upper lobe nodule is hypermetabolic with SUV 4.6.  No evidence of hypermetabolism in the neck, abdomen or pelvis. -Bronchoscopy and biopsy of station 7 lymph node on 07/06/2019 consistent with benign lymphoid tissue. -Mediastinoscopy and biopsy of the 4R lymph node by Dr. Roxan Hockey on 07/20/2019 consistent with noncaseating granuloma, sarcoidosis.  2.  Family history: -Mother died of breast cancer.  Father had lung cancer.  Maternal aunt had breast cancer. -No family history of sarcoidosis.   PLAN:  1.  Sarcoidosis of mediastinal and hilar adenopathy: -We reviewed results of the pathology report dated 07/20/2019. -I have recommended follow-up with pulmonary. -He is not symptomatic at this time.  No evidence of polyarthralgias or erythema nodosum. -I plan to see him back in 4 months.  If there is resolution of adenopathy, we will plan to discharge from clinic.  Orders placed this encounter:  No orders of the defined types were placed in this encounter.    Derek Jack, MD [place] [phone]   I, Milinda Antis, am acting as a scribe for Dr. Sanda Linger.  I, Derek Jack MD, have reviewed the above documentation for accuracy and completeness, and I agree with the above.

## 2019-08-10 NOTE — Patient Instructions (Signed)
[place] at [facility] Discharge Instructions  You were seen today by Dr. Delton Coombes. He went over your recent results. Continue your usual care with your primary care provider. Dr. Delton Coombes will see you back in 4 months for labs and follow up.   Thank you for choosing [place] at [facility] to provide your oncology and hematology care.  To afford each patient quality time with our provider, please arrive at least 15 minutes before your scheduled appointment time.   If you have a lab appointment with the [facility] please come in thru the Main Entrance and check in at the main information desk  You need to re-schedule your appointment should you arrive 10 or more minutes late.  We strive to give you quality time with our providers, and arriving late affects you and other patients whose appointments are after yours.  Also, if you no show three or more times for appointments you may be dismissed from the clinic at the providers discretion.     Again, thank you for choosing [facility].  Our hope is that these requests will decrease the amount of time that you wait before being seen by our physicians.       _____________________________________________________________  Should you have questions after your visit to [facility], please contact our office at [phone] between the hours of 8:00 a.m. and 4:30 p.m.  Voicemails left after 4:00 p.m. will not be returned until the following business day.  For prescription refill requests, have your pharmacy contact our office and allow 72 hours.    Cancer Center Support Programs:   > Cancer Support Group  2nd Tuesday of the month 1pm-2pm, Journey Room

## 2019-08-14 ENCOUNTER — Encounter: Payer: Self-pay | Admitting: Primary Care

## 2019-08-14 ENCOUNTER — Other Ambulatory Visit: Payer: Self-pay

## 2019-08-14 ENCOUNTER — Ambulatory Visit (INDEPENDENT_AMBULATORY_CARE_PROVIDER_SITE_OTHER): Payer: 59 | Admitting: Primary Care

## 2019-08-14 VITALS — BP 126/78 | HR 72 | Temp 98.4°F | Ht 68.0 in | Wt 207.8 lb

## 2019-08-14 DIAGNOSIS — D869 Sarcoidosis, unspecified: Secondary | ICD-10-CM

## 2019-08-14 MED ORDER — PREDNISONE 20 MG PO TABS: ORAL_TABLET | ORAL | 0 refills | Status: DC

## 2019-08-14 NOTE — Patient Instructions (Addendum)
Bronchoscopy results consistent with sarcoidosis.   "Sarcoidosis is a multisystem disease of unknown etiology characterized by tissue infiltration with noncaseating granulomas. The granulomas may occur in any organ, but the most frequently affected sites are the lungs, lymph nodes, skin, eyes, and liver. Patients with pulmonary sarcoidosis typically present without symptoms, but with an abnormal chest radiograph obtained for an unrelated reason. When symptomatic, patients usually report dyspnea, cough, or nonspecific chest discomfort. Spontaneous resolution of the disease is common, but progressive and disabling organ failure can occur in up to 10 percent of patients. Glucocorticoids are the most commonly used medication for the treatment of pulmonary sarcoidosis, although no medications have been approved by the [place] Food and Drug Administration (FDA) for the treatment of sarcoidosis "   Plan: Prednisone taper sent to pharmacy (40mg  x 2 weeks; 30mg  x 2 weeks; then stay on 20mg  until follow-up) Treatment prednisone taper over 4-6 weeks and then stay on low dose amount for 4-6 months  Follow-up: 4 weeks with Dr. Shearon Stalls or Eustaquio Maize NP    Sarcoidosis  Sarcoidosis is a disease that can cause inflammation in many areas of the body. It most often affects the lungs (pulmonary sarcoidosis). Sarcoidosis can also affect the lymph nodes, liver, eyes, skin, heart, or any other body tissue. Normally, cells that are part of your body's disease-fighting system (immune system) attack harmful substances (such as germs) in your body. This immune system response causes inflammation. After the harmful substance is destroyed, the inflammation and the immune cells go away. When you have sarcoidosis, your immune system causes inflammation even when there are no harmful substances, and the inflammation does not go away. Sarcoidosis also causes cells from your immune system to form small clumps of tissue (granulomas) in the  affected area of your body. What are the causes? The exact cause of sarcoidosis is not known.  It is possible that if you have a family history of this disease (genetic predisposition), the immune system response that leads to inflammation may be triggered by something in your environment, such as:  Bacteria or viruses.  Metals.  Chemicals.  Dust.  Mold or mildew. What increases the risk? You may be at a greater risk for sarcoidosis if you:  Have a family history of the disease.  Are African-American.  Are of Northern European descent.  Are 30-80 years old.  Work as a Airline pilot.  Work in an environment where you are exposed to metals, chemicals, mold or mildew, or insecticides. What are the signs or symptoms? Some people with sarcoidosis have no symptoms. Others have very mild symptoms. The symptoms usually depend on the organ that is affected. Sarcoidosis most often affects the lungs, which may include symptoms such as:  Chest pain.  Coughing.  Wheezing.  Shortness of breath. Other common symptoms include:  Night sweats.  Fever.  Weight loss.  Fatigue.  Swollen lymph nodes.  Joint pain. How is this diagnosed? Sarcoidosis may be diagnosed based on:  Your symptoms and medical history.  A physical exam.  Imaging tests to check for granulomas such as: ? Chest X-ray. ? CT scan. ? MRI. ? PET scan.  Lung function tests. These tests evaluate your breathing and check for problems that may be related to sarcoidosis.  A procedure to remove a tissue sample for testing (biopsy). You may have a biopsy of lung tissue if that is where you are having symptoms. You may have tests to check for any complications of the condition. These tests may include:  Eye exams.  MRI of the heart or brain.  Echocardiogram.  Electrocardiogram (EKG or ECG). How is this treated? In some cases, sarcoidosis does not require a specific treatment because it causes no symptoms  or mild symptoms. If your symptoms bother you or are severe, you may be prescribed medicines to reduce inflammation or relieve symptoms. These medicines may include:  Prednisone. This is a steroid that reduces inflammation related to sarcoidosis.  Hydroxychloroquine. This may be used to treat sarcoidosis that affects the skin, eyes, or brain.  Methotrexate, leflunomide, or azathioprine. These medicines affect the immune system and can help with sarcoidosis in the joints, eyes, skin, or lungs.  Medicines that you breathe in (inhalers). Inhalers can help you breathe if sarcoidosis affects your lungs. Follow these instructions at home:   Do not use any products that contain nicotine or tobacco, such as cigarettes and e-cigarettes. If you need help quitting, ask your health care provider.  Avoid secondhand smoke and irritating dust or chemicals. Stay indoors on days when air quality is poor in your area.  Return to your normal activities as told by your health care provider. Ask your health care provider what activities are safe for you.  Take or use over-the-counter and prescription medicines only as told by your health care provider.  Keep all follow-up visits as told by your health care provider. This is important. Contact a health care provider if:  You have vision problems.  You have a dry cough that does not go away.  You have an irregular heartbeat.  You have pain or aches in your joints, hands, or feet.  You have an unexplained rash. Get help right away if:  You have chest pain.  You have difficulty breathing. Summary  Sarcoidosis is a disease that can cause inflammation in many body areas of the body. It most often affects the lungs (pulmonary sarcoidosis). It can also affect the lymph nodes, liver, eyes, skin, heart, or any other body tissue.  When you have sarcoidosis, cells from your immune system form small clumps of tissue (granulomas) in the affected area of your  body.  Sarcoidosis sometimes does not require a specific treatment because it causes no symptoms or mild symptoms.  If your symptoms bother you or are severe, you may be prescribed medicines to reduce inflammation or relieve symptoms. This information is not intended to replace advice given to you by your health care provider. Make sure you discuss any questions you have with your health care provider. Document Revised: 12/31/2016 Document Reviewed: 10/26/2016 Elsevier Patient Education  2020 [employer].

## 2019-08-14 NOTE — Progress Notes (Signed)
@Patient  ID: Mathew Moore, male    DOB: [birth year], 54 y.o.   MRN: [id number]  Chief Complaint  Patient presents with  . Follow-up    Referring provider: Manon Hilding, MD  HPI: 54 year old male, never smoked.  Medical history significant for mediastinal lymphadenopathy, abnormal stress test, chest pain, sarcoidosis.  Patient of Dr. Shearon Stalls, seen for initial consult Jun 25, 2019.  Patient had a CT angio which was negative for PE.  He was ruled out for acute coronary syndrome.  CT chest showed numerous enlarged mediastinal and bilateral hilar lymph nodes. This is followed up with PET on Jun 20, 2019 which showed mediastinal and hilar lymphadenopathy marked hypermetabolic, 7 mm posterior left upper lobe nodule with SUV use 4.6.   No evidence of distant disease. Differential diagnosis includes malignancy such as lymphoma, sarcoidosis, infectious etiologies such as TB.  Bronchoscopy is diagnostic procedure was discussed in detail and patient elected to proceed with procedure.  Patient underwent bronchoscopy and biopsy of station 7 lymph node on June 4, 202 which showed benign lymphoid tissue.  He then underwent a mediastinoscopy with biopsy of 4R lymph node with Dr. Roxan Hockey on 07/20/2019 which showed non-caseating granulomatous inflammation.  No evidence of malignancy.  Findings are consistent with sarcoidosis.  AFB culture was negative.    He is currently following with medical oncology/hematology.  He was recently seen on July 9 follow-up for sarcoidosis.  He denies fevers or night sweats.  He has had 14 pound weight loss in the last month and a half.  08/14/2019 Patient presents today for 6 to 8-week follow-up.  He does have a slight dry cough. He is more short of breath on exertion. He has lost weight but has been working on this d/t blood pressure issues. He has a follow-up in 4 months with hematology/oncology.    Data Reviewed/Medical Decision Making:  07/20/19 Mediastinoscopy with biopsy  Dr. Roxan Hockey -  non-caseating granulomatous inflammation.  No evidence of malignancy.  Findings are consistent with sarcoidosis  . Review of patient's CT Angio and PET scan images revealed hypermetabolic enlarged hilar adenopathy. The patient's images have been independently reviewed by me/ Dr. Shearon Stalls     No Known Allergies  Immunization History  Administered Date(s) Administered  . Influenza,inj,Quad PF,6+ Mos 11/25/2018  . Moderna SARS-COVID-2 Vaccination 03/28/2019, 04/25/2019    Past Medical History:  Diagnosis Date  . Anxiety    ocassional  . GERD (gastroesophageal reflux disease)   . Hypertension     Tobacco History: Social History   Tobacco Use  Smoking Status Never Smoker  Smokeless Tobacco Never Used   Counseling given: Not Answered   Outpatient Medications Prior to Visit  Medication Sig Dispense Refill  . acetaminophen (TYLENOL) 500 MG tablet Take 1,000 mg by mouth every 6 (six) hours as needed for moderate pain or headache.    Marland Kitchen alum & mag hydroxide-simeth (MAALOX/MYLANTA) 200-200-20 MG/5ML suspension Take 15 mLs by mouth as needed for indigestion or heartburn.    . calcium carbonate (TUMS - DOSED IN MG ELEMENTAL CALCIUM) 500 MG chewable tablet Chew 2 tablets by mouth every 4 (four) hours as needed for indigestion or heartburn.    . citalopram (CELEXA) 20 MG tablet Take 20 mg by mouth daily.    . clonazePAM (KLONOPIN) 0.5 MG tablet Take 0.5 mg by mouth 3 (three) times daily as needed for anxiety.    Marland Kitchen losartan (COZAAR) 25 MG tablet Take 25 mg by mouth daily.    Marland Kitchen  zolpidem (AMBIEN) 10 MG tablet Take 10 mg by mouth at bedtime.      Facility-Administered Medications Prior to Visit  Medication Dose Route Frequency Provider Last Rate Last Admin  . sodium chloride flush (NS) 0.9 % injection 3 mL  3 mL Intravenous Q12H Josue Hector, MD       Review of Systems  Review of Systems  Constitutional: Negative.   Respiratory: Positive for cough and wheezing.         Dyspnea on exertion  Cardiovascular: Negative.    Physical Exam  BP 126/78 (BP Location: Left Arm, Cuff Size: Normal)   Pulse 72   Temp 98.4 F (36.9 C) (Oral)   Ht 5\' 8"  (1.727 m)   Wt 207 lb 12.8 oz (94.3 kg)   SpO2 100%   BMI 31.60 kg/m  Physical Exam Constitutional:      General: He is not in acute distress.    Appearance: Normal appearance. He is not ill-appearing.  HENT:     Head: Normocephalic and atraumatic.     Mouth/Throat:     Mouth: Mucous membranes are moist.     Pharynx: Oropharynx is clear.  Cardiovascular:     Rate and Rhythm: Normal rate and regular rhythm.  Pulmonary:     Effort: Pulmonary effort is normal.     Breath sounds: Normal breath sounds. No wheezing, rhonchi or rales.     Comments: CTA Musculoskeletal:        General: Normal range of motion.  Skin:    General: Skin is warm and dry.     Comments: No skin nodules  Neurological:     General: No focal deficit present.     Mental Status: He is alert and oriented to person, place, and time. Mental status is at baseline.  Psychiatric:        Mood and Affect: Mood normal.        Behavior: Behavior normal.        Thought Content: Thought content normal.        Judgment: Judgment normal.      Lab Results:  CBC    Component Value Date/Time   WBC 4.7 07/20/2019 0716   RBC 4.92 07/20/2019 0716   HGB 14.2 07/20/2019 0716   HCT 42.7 07/20/2019 0716   PLT 303 07/20/2019 0716   MCV 86.8 07/20/2019 0716   MCH 28.9 07/20/2019 0716   MCHC 33.3 07/20/2019 0716   RDW 13.2 07/20/2019 0716   LYMPHSABS 1.2 06/11/2019 0929   MONOABS 0.3 06/11/2019 0929   EOSABS 0.1 06/11/2019 0929   BASOSABS 0.1 06/11/2019 0929    BMET    Component Value Date/Time   NA 139 07/20/2019 0716   K 3.8 07/20/2019 0716   CL 105 07/20/2019 0716   CO2 27 07/20/2019 0716   GLUCOSE 99 07/20/2019 0716   BUN 14 07/20/2019 0716   CREATININE 0.96 07/20/2019 0716   CALCIUM 9.0 07/20/2019 0716   GFRNONAA >60 07/20/2019  0716   GFRAA >60 07/20/2019 0716    BNP No results found for: BNP  ProBNP No results found for: PROBNP  Imaging: DG Chest 2 View  Result Date: 07/20/2019 CLINICAL DATA:  54 year old male preoperative study for mediastinal os could be, mediastinal and hilar lymphadenopathy. EXAM: CHEST - 2 VIEW COMPARISON:  Portable chest 07/06/2019 and earlier. FINDINGS: Stable cardiac size and mediastinal contours. No cardiomegaly. Visualized tracheal air column is within normal limits. Lower lung volumes on the lateral view. Both lungs remain  clear. No pneumothorax or pleural effusion. No osseous abnormality identified. Negative visible bowel gas pattern. IMPRESSION: Stable mediastinal contour with underlying lymphadenopathy. Lungs remain clear. Electronically Signed   By: Genevie Ann M.D.   On: 07/20/2019 09:38     Assessment & Plan:   Sarcoidosis - Patient underwent a mediastinoscopy with biopsy of 4R lymph node with Dr. Roxan Hockey on 07/20/2019 which showed non-caseating granulomatous inflammation.  No evidence of malignancy. - Findings are consistent with sarcoidosis. Treatment prednisone taper over 4-6 weeks and then stay on low dose amount for 4-6 months. Plan repeat imaging in 3-4 months unless symptoms clinically worsen prior to then  - Rx Prednisone taper sent to pharmacy (40mg  x 2 weeks; 30mg  x 2 weeks; then stay on 20mg  until follow-up) - Follow-up in 4 weeks with Dr. Shearon Stalls or Eustaquio Maize NP Martyn Ehrich, NP 08/14/2019

## 2019-08-14 NOTE — Assessment & Plan Note (Addendum)
-   Patient underwent a mediastinoscopy with biopsy of 4R lymph node with Dr. Roxan Hockey on 07/20/2019 which showed non-caseating granulomatous inflammation.  No evidence of malignancy. - Findings are consistent with sarcoidosis. Treatment prednisone taper over 4-6 weeks and then stay on low dose amount for 4-6 months. Plan repeat imaging in 3-4 months unless symptoms clinically worsen prior to then  - Rx Prednisone taper sent to pharmacy (40mg  x 2 weeks; 30mg  x 2 weeks; then stay on 20mg  until follow-up) - Follow-up in 4 weeks with Dr. Shearon Stalls or Eustaquio Maize NP

## 2019-08-17 LAB — FUNGUS CULTURE RESULT

## 2019-08-17 LAB — FUNGAL ORGANISM REFLEX

## 2019-08-17 LAB — FUNGUS CULTURE WITH STAIN

## 2019-08-20 ENCOUNTER — Telehealth: Payer: Self-pay | Admitting: Primary Care

## 2019-08-20 NOTE — Telephone Encounter (Signed)
I see no fax on this pt  I do see that we faxed ov notes on pt to them  LMTCB x 1 for Jody- did she get what she needed??

## 2019-08-21 LAB — ACID FAST CULTURE WITH REFLEXED SENSITIVITIES (MYCOBACTERIA): Acid Fast Culture: NEGATIVE

## 2019-08-22 NOTE — Telephone Encounter (Signed)
LMTCB x 2 for Jody asking that she call back if something further needed

## 2019-08-24 ENCOUNTER — Ambulatory Visit (INDEPENDENT_AMBULATORY_CARE_PROVIDER_SITE_OTHER): Payer: 59 | Admitting: Physician Assistant

## 2019-08-24 ENCOUNTER — Encounter: Payer: Self-pay | Admitting: Physician Assistant

## 2019-08-24 ENCOUNTER — Other Ambulatory Visit: Payer: Self-pay

## 2019-08-24 DIAGNOSIS — Z1283 Encounter for screening for malignant neoplasm of skin: Secondary | ICD-10-CM | POA: Diagnosis not present

## 2019-08-24 DIAGNOSIS — D1801 Hemangioma of skin and subcutaneous tissue: Secondary | ICD-10-CM

## 2019-08-24 DIAGNOSIS — D229 Melanocytic nevi, unspecified: Secondary | ICD-10-CM

## 2019-08-24 DIAGNOSIS — D485 Neoplasm of uncertain behavior of skin: Secondary | ICD-10-CM

## 2019-08-24 HISTORY — DX: Melanocytic nevi, unspecified: D22.9

## 2019-08-24 NOTE — Telephone Encounter (Signed)
Yes you can write a work note- recommend he decrease to 20mg  prednisone daily and stay on this until follow up

## 2019-08-24 NOTE — Patient Instructions (Signed)

## 2019-08-24 NOTE — Progress Notes (Addendum)
   New Patient   Subjective  Mathew Moore is a 54 y.o. male who presents for the following: Skin Problem (Check spots on right leg and left arm. Also patient has some moles on back to check. ).   The following portions of the chart were reviewed this encounter and updated as appropriate: Tobacco  Allergies  Meds  Problems  Med Hx  Surg Hx  Fam Hx      Objective  Well appearing patient in no apparent distress; mood and affect are within normal limits.  A full examination was performed including scalp, head, eyes, ears, nose, lips, neck, chest, axillae, abdomen, back, buttocks, bilateral upper extremities, bilateral lower extremities, hands, feet, fingers, toes, fingernails, and toenails. All findings within normal limits unless otherwise noted below.  Objective  Head - to toe: No atypical nevi No signs of non-mole skin cancer.   Objective  Right Lower Leg - Anterior: Bichromic dark nested macule.      Objective  Left Breast: Pink papule  Assessment & Plan  Screening exam for skin cancer Head - to toe  observe  Neoplasm of uncertain behavior of skin Right Lower Leg - Anterior  Epidermal / dermal shaving  Lesion diameter (cm):  1 Informed consent: discussed and consent obtained   Timeout: patient name, date of birth, surgical site, and procedure verified   Procedure prep:  Patient was prepped and draped in usual sterile fashion Prep type:  Chlorhexidine Anesthesia: the lesion was anesthetized in a standard fashion   Anesthetic:  1% lidocaine w/ epinephrine 1-100,000 local infiltration Instrument used: DermaBlade   Hemostasis achieved with: aluminum chloride   Outcome: patient tolerated procedure well   Post-procedure details: sterile dressing applied and wound care instructions given   Dressing type: petrolatum gauze, petrolatum and bandage    Specimen 1 - Surgical pathology Differential Diagnosis: atypia Check Margins: No  Hemangioma of  skin Left Breast  observe I, Thayne Cindric, PA-C, have reviewed all documentation for this visit. The documentation on 10/05/19 for the exam, diagnosis, procedures, and orders are all accurate and complete.

## 2019-08-24 NOTE — Telephone Encounter (Signed)
Beth- please advise on pt email  In our last visit shortness of breath & coughing has gotten worse.  Slight chest heaviness.  So with the 40 mg of prednisone I'm real sweaty, irritable, etc.  I did not ask for a work note bc my family dr already had me wrote out until 8/16.  So I'm also needing a work note from you stating out until 8/16. 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  I really need an answer.  Thank you,  Dutch Quint

## 2019-08-27 NOTE — Telephone Encounter (Signed)
Rec'd form via interoffice mail from [employer] for patient to return to work status - fwd to BW to complete -pr

## 2019-08-29 NOTE — Telephone Encounter (Signed)
Rec'd completed form - fwd to [employer] via interoffice mail - pr

## 2019-09-03 LAB — ACID FAST CULTURE WITH REFLEXED SENSITIVITIES (MYCOBACTERIA): Acid Fast Culture: NEGATIVE

## 2019-09-04 ENCOUNTER — Encounter: Payer: Self-pay | Admitting: *Deleted

## 2019-09-04 ENCOUNTER — Telehealth: Payer: Self-pay | Admitting: *Deleted

## 2019-09-04 NOTE — Telephone Encounter (Signed)
-----   Message from Warren Danes, [place] sent at 09/01/2019  6:28 PM EDT ----- RTC prn

## 2019-09-04 NOTE — Telephone Encounter (Signed)
Pathology to patient.  °

## 2019-09-10 NOTE — Progress Notes (Signed)
@Patient  ID: Mathew Moore, male    DOB: 01/10/1966, 54 y.o.   MRN: [id number]  Chief Complaint  Patient presents with  . Follow-up    Reports minimal increase with exertional shortness of breath over the past month.    Referring provider: Manon Hilding, MD  HPI: 54 year old male, never smoked.  Medical history significant for mediastinal lymphadenopathy, abnormal stress test, chest pain, sarcoidosis.  Patient of Dr. Shearon Stalls, seen for initial consult Jun 25, 2019.  Patient had a CT angio which was negative for PE.  He was ruled out for acute coronary syndrome.  CT chest showed numerous enlarged mediastinal and bilateral hilar lymph nodes. This is followed up with PET on Jun 20, 2019 which showed mediastinal and hilar lymphadenopathy marked hypermetabolic, 7 mm posterior left upper lobe nodule with SUV use 4.6.   No evidence of distant disease. Differential diagnosis includes malignancy such as lymphoma, sarcoidosis, infectious etiologies such as TB.  Bronchoscopy is diagnostic procedure was discussed in detail and patient elected to proceed with procedure.  Patient underwent bronchoscopy and biopsy of station 7 lymph node on June 4, 202 which showed benign lymphoid tissue.  He then underwent a mediastinoscopy with biopsy of 4R lymph node with Dr. Roxan Hockey on 07/20/2019 which showed non-caseating granulomatous inflammation.  No evidence of malignancy.  Findings are consistent with sarcoidosis.  AFB culture was negative.    He is currently following with medical oncology/hematology.  He was recently seen on July 9 follow-up for sarcoidosis.  He denies fevers or night sweats.  He has had 14 pound weight loss in the last month and a half.  Previous [facility] pulmonary encounter: 08/14/2019 Patient presents today for 6 to 8-week follow-up.  He does have a slight dry cough. He is more short of breath on exertion. He has lost weight but has been working on this d/t blood pressure issues. He has a follow-up  in 4 months with hematology/oncology.    09/11/2019- Interim hx Patient presents today for 1 month follow-up. Patient under went mediastonoscopy with biopsy on 07/20/19 which showed non-caseating granulomatous inflammation. No evidence of malignancy. Findings consistent with sarcoidosis., During last visit started on prednisone 40mg  x 2 weeks then 30mg  x 2 weeks; he will then stay on 10-20mg  daily for approximately 4-6 months. He will need repeat imaging in 3 months.   He is still feeling short of breath. He has dull pain right side of his chest. He reports no significant improvement since being on prednisone. He is having difficulty with dry cough. This is worse at night. He is not taking anything for this. Maintaining his weight. He wakes up hungry in the middle of the night. He is currently out of work on short term disability, unable to return today. Needs note to extend date.  Denies post nasal drip symptoms, hemoptysis, chest tightness, wheezing.    Data Reviewed/Medical Decision Making:  07/20/19 Mediastinoscopy with biopsy Dr. Roxan Hockey -  non-caseating granulomatous inflammation.  No evidence of malignancy.  Findings are consistent with sarcoidosis  . Review of patient's CT Angio and PET scan images revealed hypermetabolic enlarged hilar adenopathy. The patient's images have been independently reviewed by me/ Dr. Shearon Stalls    No Known Allergies  Immunization History  Administered Date(s) Administered  . Influenza,inj,Quad PF,6+ Mos 11/25/2018  . Moderna SARS-COVID-2 Vaccination 03/28/2019, 04/25/2019    Past Medical History:  Diagnosis Date  . Anxiety    ocassional  . Atypical mole 08/24/2019   mild- right lower  leg - anterior  . GERD (gastroesophageal reflux disease)   . Hypertension     Tobacco History: Social History   Tobacco Use  Smoking Status Never Smoker  Smokeless Tobacco Never Used   Counseling given: Not Answered   Outpatient Medications Prior to Visit    Medication Sig Dispense Refill  . acetaminophen (TYLENOL) 500 MG tablet Take 1,000 mg by mouth every 6 (six) hours as needed for moderate pain or headache.    Marland Kitchen alum & mag hydroxide-simeth (MAALOX/MYLANTA) 200-200-20 MG/5ML suspension Take 15 mLs by mouth as needed for indigestion or heartburn.    . calcium carbonate (TUMS - DOSED IN MG ELEMENTAL CALCIUM) 500 MG chewable tablet Chew 2 tablets by mouth every 4 (four) hours as needed for indigestion or heartburn.    . citalopram (CELEXA) 20 MG tablet Take 20 mg by mouth daily.    . clonazePAM (KLONOPIN) 0.5 MG tablet Take 0.5 mg by mouth 3 (three) times daily as needed for anxiety.    Marland Kitchen losartan (COZAAR) 25 MG tablet Take 25 mg by mouth daily.    . predniSONE (DELTASONE) 20 MG tablet Take 2 tablets (40 mg total) by mouth daily with breakfast for 14 days, THEN 1.5 tablets (30 mg total) daily with breakfast for 14 days, THEN 1 tablet (20 mg total) daily with breakfast for 14 days. 63 tablet 0  . zolpidem (AMBIEN) 10 MG tablet Take 10 mg by mouth at bedtime.      Facility-Administered Medications Prior to Visit  Medication Dose Route Frequency Provider Last Rate Last Admin  . sodium chloride flush (NS) 0.9 % injection 3 mL  3 mL Intravenous Q12H Josue Hector, MD        Review of Systems  Review of Systems  Constitutional: Positive for appetite change. Negative for fever and unexpected weight change.  HENT: Negative.   Eyes: Negative.   Respiratory: Positive for cough and shortness of breath. Negative for chest tightness and wheezing.   Cardiovascular: Negative.     Physical Exam  BP 136/84 (BP Location: Left Arm, Cuff Size: Normal)   Pulse 88   Temp (!) 97.2 F (36.2 C)   Ht 5\' 8"  (1.727 m)   Wt 215 lb (97.5 kg)   SpO2 99%   BMI 32.69 kg/m  Physical Exam Constitutional:      General: He is not in acute distress.    Appearance: Normal appearance. He is not ill-appearing.  HENT:     Right Ear: Tympanic membrane and external ear  normal.     Left Ear: Tympanic membrane and external ear normal.     Mouth/Throat:     Mouth: Mucous membranes are moist.     Pharynx: Oropharynx is clear.  Cardiovascular:     Rate and Rhythm: Normal rate and regular rhythm.  Pulmonary:     Effort: Pulmonary effort is normal.     Breath sounds: Normal breath sounds. No wheezing, rhonchi or rales.  Musculoskeletal:        General: Normal range of motion.  Skin:    General: Skin is warm and dry.  Neurological:     General: No focal deficit present.     Mental Status: He is alert and oriented to person, place, and time. Mental status is at baseline.  Psychiatric:        Mood and Affect: Mood normal.        Behavior: Behavior normal.        Thought Content: Thought content  normal.        Judgment: Judgment normal.      Lab Results:  CBC    Component Value Date/Time   WBC 4.7 07/20/2019 0716   RBC 4.92 07/20/2019 0716   HGB 14.2 07/20/2019 0716   HCT 42.7 07/20/2019 0716   PLT 303 07/20/2019 0716   MCV 86.8 07/20/2019 0716   MCH 28.9 07/20/2019 0716   MCHC 33.3 07/20/2019 0716   RDW 13.2 07/20/2019 0716   LYMPHSABS 1.2 06/11/2019 0929   MONOABS 0.3 06/11/2019 0929   EOSABS 0.1 06/11/2019 0929   BASOSABS 0.1 06/11/2019 0929    BMET    Component Value Date/Time   NA 139 07/20/2019 0716   K 3.8 07/20/2019 0716   CL 105 07/20/2019 0716   CO2 27 07/20/2019 0716   GLUCOSE 99 07/20/2019 0716   BUN 14 07/20/2019 0716   CREATININE 0.96 07/20/2019 0716   CALCIUM 9.0 07/20/2019 0716   GFRNONAA >60 07/20/2019 0716   GFRAA >60 07/20/2019 0716    BNP No results found for: BNP  ProBNP No results found for: PROBNP  Imaging: No results found.   Assessment & Plan:   Sarcoidosis - Continues to have some shortness of breath, chest discomfort and dry cough - He was started on prednisone 40mg  daily in July and tapered to current dose 20mg  daily. He will stay on 20mg  daily until next follow-up unless symptoms acutely  worsen. Plan is to continue low dose prednisone for 4-6 months.  - Will give patient RX for albuterol hfa to use as needed for breakthrough shortness of breath/wheezing and also tesslon perles for cough  - Repeat CXR today; due for CT chest wo contrast in October 2021  - Note to extend work leave given  - FU in 6 weeks with Dr. Shearon Stalls or APP    Martyn Ehrich, NP 09/11/2019

## 2019-09-11 ENCOUNTER — Other Ambulatory Visit: Payer: Self-pay

## 2019-09-11 ENCOUNTER — Ambulatory Visit (INDEPENDENT_AMBULATORY_CARE_PROVIDER_SITE_OTHER): Payer: 59 | Admitting: Primary Care

## 2019-09-11 ENCOUNTER — Encounter: Payer: Self-pay | Admitting: Primary Care

## 2019-09-11 ENCOUNTER — Telehealth: Payer: Self-pay | Admitting: Internal Medicine

## 2019-09-11 ENCOUNTER — Ambulatory Visit (INDEPENDENT_AMBULATORY_CARE_PROVIDER_SITE_OTHER): Payer: 59

## 2019-09-11 VITALS — BP 136/84 | HR 88 | Temp 97.2°F | Ht 68.0 in | Wt 215.0 lb

## 2019-09-11 DIAGNOSIS — R0602 Shortness of breath: Secondary | ICD-10-CM

## 2019-09-11 DIAGNOSIS — D869 Sarcoidosis, unspecified: Secondary | ICD-10-CM | POA: Diagnosis not present

## 2019-09-11 MED ORDER — BENZONATATE 200 MG PO CAPS: 200.0000 mg | ORAL_CAPSULE | Freq: Three times a day (TID) | ORAL | 1 refills | Status: DC | PRN

## 2019-09-11 MED ORDER — ALBUTEROL SULFATE HFA 108 (90 BASE) MCG/ACT IN AERS: 1.0000 | INHALATION_SPRAY | Freq: Four times a day (QID) | RESPIRATORY_TRACT | 1 refills | Status: DC | PRN

## 2019-09-11 NOTE — Telephone Encounter (Signed)
Letter from today and ov note was faxed to the number that the pt provided  Pt aware and nothing further needed

## 2019-09-11 NOTE — Progress Notes (Signed)
Reviewed and agree with assessment/plan.   Chesley Mires, MD [facility] Pulmonary/Critical Care 09/11/2019, 11:42 AM Pager:  [phone]

## 2019-09-11 NOTE — Patient Instructions (Addendum)
Pleasure seeing you today Mathew Moore  Recommendations: - Stay on prednisone 20mg  daily  RX: - Tessalon perles three times a day as needed for cough - Albuterol 2 puffs every 6 hours as needed for shortness of breath/wheezing  Orders: - CXR today (ordered) - CT chest in 2-3 months re: sarcoidosis follow-up (ordered) - Needs note to extend being out of work for another 6 weeks until next OV  Follow-up: - 6 weeks with Dr. Shearon Stalls or Beth     Sarcoidosis  Sarcoidosis is a disease that can cause inflammation in many areas of the body. It most often affects the lungs (pulmonary sarcoidosis). Sarcoidosis can also affect the lymph nodes, liver, eyes, skin, heart, or any other body tissue. Normally, cells that are part of your body's disease-fighting system (immune system) attack harmful substances (such as germs) in your body. This immune system response causes inflammation. After the harmful substance is destroyed, the inflammation and the immune cells go away. When you have sarcoidosis, your immune system causes inflammation even when there are no harmful substances, and the inflammation does not go away. Sarcoidosis also causes cells from your immune system to form small clumps of tissue (granulomas) in the affected area of your body. What are the causes? The exact cause of sarcoidosis is not known.  It is possible that if you have a family history of this disease (genetic predisposition), the immune system response that leads to inflammation may be triggered by something in your environment, such as:  Bacteria or viruses.  Metals.  Chemicals.  Dust.  Mold or mildew. What increases the risk? You may be at a greater risk for sarcoidosis if you:  Have a family history of the disease.  Are African-American.  Are of Northern European descent.  Are 48-22 years old.  Work as a Airline pilot.  Work in an environment where you are exposed to metals, chemicals, mold or mildew, or  insecticides. What are the signs or symptoms? Some people with sarcoidosis have no symptoms. Others have very mild symptoms. The symptoms usually depend on the organ that is affected. Sarcoidosis most often affects the lungs, which may include symptoms such as:  Chest pain.  Coughing.  Wheezing.  Shortness of breath. Other common symptoms include:  Night sweats.  Fever.  Weight loss.  Fatigue.  Swollen lymph nodes.  Joint pain. How is this diagnosed? Sarcoidosis may be diagnosed based on:  Your symptoms and medical history.  A physical exam.  Imaging tests to check for granulomas such as: ? Chest X-ray. ? CT scan. ? MRI. ? PET scan.  Lung function tests. These tests evaluate your breathing and check for problems that may be related to sarcoidosis.  A procedure to remove a tissue sample for testing (biopsy). You may have a biopsy of lung tissue if that is where you are having symptoms. You may have tests to check for any complications of the condition. These tests may include:  Eye exams.  MRI of the heart or brain.  Echocardiogram.  Electrocardiogram (EKG or ECG). How is this treated? In some cases, sarcoidosis does not require a specific treatment because it causes no symptoms or mild symptoms. If your symptoms bother you or are severe, you may be prescribed medicines to reduce inflammation or relieve symptoms. These medicines may include:  Prednisone. This is a steroid that reduces inflammation related to sarcoidosis.  Hydroxychloroquine. This may be used to treat sarcoidosis that affects the skin, eyes, or brain.  Methotrexate, leflunomide, or  azathioprine. These medicines affect the immune system and can help with sarcoidosis in the joints, eyes, skin, or lungs.  Medicines that you breathe in (inhalers). Inhalers can help you breathe if sarcoidosis affects your lungs. Follow these instructions at home:   Do not use any products that contain nicotine  or tobacco, such as cigarettes and e-cigarettes. If you need help quitting, ask your health care provider.  Avoid secondhand smoke and irritating dust or chemicals. Stay indoors on days when air quality is poor in your area.  Return to your normal activities as told by your health care provider. Ask your health care provider what activities are safe for you.  Take or use over-the-counter and prescription medicines only as told by your health care provider.  Keep all follow-up visits as told by your health care provider. This is important. Contact a health care provider if:  You have vision problems.  You have a dry cough that does not go away.  You have an irregular heartbeat.  You have pain or aches in your joints, hands, or feet.  You have an unexplained rash. Get help right away if:  You have chest pain.  You have difficulty breathing. Summary  Sarcoidosis is a disease that can cause inflammation in many body areas of the body. It most often affects the lungs (pulmonary sarcoidosis). It can also affect the lymph nodes, liver, eyes, skin, heart, or any other body tissue.  When you have sarcoidosis, cells from your immune system form small clumps of tissue (granulomas) in the affected area of your body.  Sarcoidosis sometimes does not require a specific treatment because it causes no symptoms or mild symptoms.  If your symptoms bother you or are severe, you may be prescribed medicines to reduce inflammation or relieve symptoms. This information is not intended to replace advice given to you by your health care provider. Make sure you discuss any questions you have with your health care provider. Document Revised: 12/31/2016 Document Reviewed: 10/26/2016 Elsevier Patient Education  2020 [employer].

## 2019-09-11 NOTE — Assessment & Plan Note (Addendum)
-   Continues to have some shortness of breath, chest discomfort and dry cough - He was started on prednisone 40mg  daily in July and tapered to current dose 20mg  daily. He will stay on 20mg  daily until next follow-up unless symptoms acutely worsen. Plan is to continue low dose prednisone for 4-6 months.  - Will give patient RX for albuterol hfa to use as needed for breakthrough shortness of breath/wheezing and also tesslon perles for cough  - Repeat CXR today; due for CT chest wo contrast in October 2021  - Note to extend work leave given  - FU in 6 weeks with Dr. Shearon Stalls or APP

## 2019-09-14 ENCOUNTER — Telehealth: Payer: Self-pay | Admitting: Primary Care

## 2019-09-14 NOTE — Telephone Encounter (Signed)
Attempted to call [employer], left message. It was after hours and Brittney was not available.

## 2019-09-17 MED ORDER — PREDNISONE 20 MG PO TABS: 20.0000 mg | ORAL_TABLET | Freq: Every day | ORAL | 1 refills | Status: DC

## 2019-09-19 NOTE — Telephone Encounter (Signed)
Can you advise traige where we can find this paperwork?

## 2019-09-19 NOTE — Telephone Encounter (Signed)
Rec'd signed form back from [place]- faxed to [place], copy scanned in chart, called patient and advised this was done -pr

## 2019-09-19 NOTE — Telephone Encounter (Signed)
Rec'd disability form from [place] to [place] for signature-pr

## 2019-09-20 NOTE — Telephone Encounter (Signed)
Called and spoke with patient he states that he always has shortness of breath but nothing out of the norm. Denies chest pain. States that he is using his albutterol. Informed patient to take Tessalon perles 3 times a day, try Delsym over the counter cough syrup twice a day and to use his rescue inhaler when he is having one if his coughing fits. Do one puff then wait about 30 seconds and do another and see if that helps. Patient said he will try it and let us know if it does not improve. Nothing further needed at this time.

## 2019-09-20 NOTE — Telephone Encounter (Signed)
I have scanned under documents tab on the patient's appointment desk . -pr

## 2019-09-20 NOTE — Telephone Encounter (Signed)
Beth please advise on patient mychart message  Hey I have been coughing up a little blood last couple days is this something I need to worry about. It's not a lot just spotty when I coughing.

## 2019-09-20 NOTE — Telephone Encounter (Signed)
Have him take Delsym twice daily and Tessalon perles three times a day. Any shortness of breath or chest pain? Has he been using or not using albuterol?

## 2019-09-25 ENCOUNTER — Ambulatory Visit (INDEPENDENT_AMBULATORY_CARE_PROVIDER_SITE_OTHER): Payer: 59 | Admitting: Psychologist

## 2019-09-25 DIAGNOSIS — F411 Generalized anxiety disorder: Secondary | ICD-10-CM

## 2019-10-05 ENCOUNTER — Ambulatory Visit (INDEPENDENT_AMBULATORY_CARE_PROVIDER_SITE_OTHER): Payer: 59 | Admitting: Psychologist

## 2019-10-05 DIAGNOSIS — F411 Generalized anxiety disorder: Secondary | ICD-10-CM | POA: Diagnosis not present

## 2019-10-10 MED ORDER — HYDROCODONE-HOMATROPINE 5-1.5 MG/5ML PO SYRP: 5.0000 mL | ORAL_SOLUTION | Freq: Four times a day (QID) | ORAL | 0 refills | Status: DC | PRN

## 2019-10-10 NOTE — Telephone Encounter (Signed)
Please tell him to go back to 30mg  prednisone until his visit with Dr. Shearon Stalls. CXR at last visit looked improved. Use albuterol 2 puff every 4-6 hours for shortness of breath/cough. If he has any post nasal drip at all recommend flonase nasal spray over the counter. Ill send in one time prescription for Hycodan cough syrup to use sparingly for cough, not to be combined with klonopin or ambien.

## 2019-10-10 NOTE — Telephone Encounter (Signed)
Beth this is from the pt:  I believe it was when I went to thirty so we skipped some of that and went to twenty . I have had this coughing really all through but just last couple days taking what I was prescribed just fills like it is not working.

## 2019-10-10 NOTE — Telephone Encounter (Signed)
We have him on prednisone for sarcoidosis, can you ask at what dose during taper did he notice his cough worsen?

## 2019-10-10 NOTE — Telephone Encounter (Signed)
Beth please advise on patient mychart message  My coughing has gotten worse over the last few days the cough medicine that I have and the over the counter you recommended dont seem to be helping anymore. Is there anything else we can try ?  Thanks , Dutch Quint

## 2019-10-16 MED ORDER — HYDROCODONE-HOMATROPINE 5-1.5 MG/5ML PO SYRP: 5.0000 mL | ORAL_SOLUTION | Freq: Four times a day (QID) | ORAL | 0 refills | Status: DC | PRN

## 2019-10-16 NOTE — Telephone Encounter (Signed)
[place], thanks

## 2019-10-16 NOTE — Telephone Encounter (Signed)
Mathew Moore please advise on pt email, the hycodan you sent 10/10/19 was not available at [facility], you all called me some cough medicine with codeine to [facility].  It is completely out of stock & do not know when they'll get it.  May you please call [facility] [phone].  I called & they had it in stock.  Thank you

## 2019-10-16 NOTE — Telephone Encounter (Signed)
That's fine, can you pend it to me and ill sign

## 2019-10-23 ENCOUNTER — Encounter: Payer: Self-pay | Admitting: Internal Medicine

## 2019-10-23 ENCOUNTER — Telehealth: Payer: Self-pay | Admitting: Internal Medicine

## 2019-10-23 ENCOUNTER — Ambulatory Visit (INDEPENDENT_AMBULATORY_CARE_PROVIDER_SITE_OTHER): Payer: 59 | Admitting: Internal Medicine

## 2019-10-23 ENCOUNTER — Other Ambulatory Visit: Payer: Self-pay

## 2019-10-23 VITALS — BP 140/84 | HR 72 | Temp 96.9°F | Ht 67.0 in | Wt 218.0 lb

## 2019-10-23 DIAGNOSIS — K219 Gastro-esophageal reflux disease without esophagitis: Secondary | ICD-10-CM | POA: Diagnosis not present

## 2019-10-23 DIAGNOSIS — D860 Sarcoidosis of lung: Secondary | ICD-10-CM | POA: Diagnosis not present

## 2019-10-23 DIAGNOSIS — Z23 Encounter for immunization: Secondary | ICD-10-CM | POA: Diagnosis not present

## 2019-10-23 MED ORDER — OMEPRAZOLE 40 MG PO CPDR: 40.0000 mg | DELAYED_RELEASE_CAPSULE | Freq: Every day | ORAL | 5 refills | Status: DC

## 2019-10-23 NOTE — Patient Instructions (Addendum)
The patient should have follow up scheduled with myself in 2 months.   Prior to next visit patient should have: Full set of PFTs  Start Omeprazole once a day.    Sarcoidosis  Sarcoidosis is a disease that can cause inflammation in many areas of the body. It most often affects the lungs (pulmonary sarcoidosis). Sarcoidosis can also affect the lymph nodes, liver, eyes, skin, heart, or any other body tissue. Normally, cells that are part of your body's disease-fighting system (immune system) attack harmful substances (such as germs) in your body. This immune system response causes inflammation. After the harmful substance is destroyed, the inflammation and the immune cells go away. When you have sarcoidosis, your immune system causes inflammation even when there are no harmful substances, and the inflammation does not go away. Sarcoidosis also causes cells from your immune system to form small clumps of tissue (granulomas) in the affected area of your body. What are the causes? The exact cause of sarcoidosis is not known.  It is possible that if you have a family history of this disease (genetic predisposition), the immune system response that leads to inflammation may be triggered by something in your environment, such as:  Bacteria or viruses.  Metals.  Chemicals.  Dust.  Mold or mildew. What increases the risk? You may be at a greater risk for sarcoidosis if you:  Have a family history of the disease.  Are African-American.  Are of Northern European descent.  Are 32-60 years old.  Work as a Airline pilot.  Work in an environment where you are exposed to metals, chemicals, mold or mildew, or insecticides. What are the signs or symptoms? Some people with sarcoidosis have no symptoms. Others have very mild symptoms. The symptoms usually depend on the organ that is affected. Sarcoidosis most often affects the lungs, which may include symptoms such as:  Chest  pain.  Coughing.  Wheezing.  Shortness of breath. Other common symptoms include:  Night sweats.  Fever.  Weight loss.  Fatigue.  Swollen lymph nodes.  Joint pain. How is this diagnosed? Sarcoidosis may be diagnosed based on:  Your symptoms and medical history.  A physical exam.  Imaging tests to check for granulomas such as: ? Chest X-ray. ? CT scan. ? MRI. ? PET scan.  Lung function tests. These tests evaluate your breathing and check for problems that may be related to sarcoidosis.  A procedure to remove a tissue sample for testing (biopsy). You may have a biopsy of lung tissue if that is where you are having symptoms. You may have tests to check for any complications of the condition. These tests may include:  Eye exams.  MRI of the heart or brain.  Echocardiogram.  Electrocardiogram (EKG or ECG). How is this treated? In some cases, sarcoidosis does not require a specific treatment because it causes no symptoms or mild symptoms. If your symptoms bother you or are severe, you may be prescribed medicines to reduce inflammation or relieve symptoms. These medicines may include:  Prednisone. This is a steroid that reduces inflammation related to sarcoidosis.  Hydroxychloroquine. This may be used to treat sarcoidosis that affects the skin, eyes, or brain.  Methotrexate, leflunomide, or azathioprine. These medicines affect the immune system and can help with sarcoidosis in the joints, eyes, skin, or lungs.  Medicines that you breathe in (inhalers). Inhalers can help you breathe if sarcoidosis affects your lungs. Follow these instructions at home:   Do not use any products that contain nicotine or  tobacco, such as cigarettes and e-cigarettes. If you need help quitting, ask your health care provider.  Avoid secondhand smoke and irritating dust or chemicals. Stay indoors on days when air quality is poor in your area.  Return to your normal activities as told by  your health care provider. Ask your health care provider what activities are safe for you.  Take or use over-the-counter and prescription medicines only as told by your health care provider.  Keep all follow-up visits as told by your health care provider. This is important. Contact a health care provider if:  You have vision problems.  You have a dry cough that does not go away.  You have an irregular heartbeat.  You have pain or aches in your joints, hands, or feet.  You have an unexplained rash. Get help right away if:  You have chest pain.  You have difficulty breathing. Summary  Sarcoidosis is a disease that can cause inflammation in many body areas of the body. It most often affects the lungs (pulmonary sarcoidosis). It can also affect the lymph nodes, liver, eyes, skin, heart, or any other body tissue.  When you have sarcoidosis, cells from your immune system form small clumps of tissue (granulomas) in the affected area of your body.  Sarcoidosis sometimes does not require a specific treatment because it causes no symptoms or mild symptoms.  If your symptoms bother you or are severe, you may be prescribed medicines to reduce inflammation or relieve symptoms. This information is not intended to replace advice given to you by your health care provider. Make sure you discuss any questions you have with your health care provider. Document Revised: 12/31/2016 Document Reviewed: 10/26/2016 Elsevier Patient Education  2020 [employer].

## 2019-10-23 NOTE — Telephone Encounter (Signed)
Patient provided a fax # during his visit to have paperwork faxed to after completed for his job:  [phone].

## 2019-10-23 NOTE — Progress Notes (Signed)
Mathew Moore    [id number]    21-Oct-1965  Primary Care Physician:Sasser, Silvestre Moment, MD Date of Appointment: 10/23/2019 Established Patient Visit  Chief complaint:   Chief Complaint  Patient presents with  . Follow-up    prednisone up to 30mg .  nagging cough late at night and early in am.  trying to walk 20 minutes a day,  drained of enery,  not much improvement in cough.  cough waking up at night.  this am red spots in it.       HPI: Mathew Moore is a 54 y.o. gentleman who presented with Pet-Avid hilar adenopathy. 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   Interval Updates: Here for follow up today. Has seen Derl Barrow NP in the interim. Stated on prednisone as well and currently down to 30 mg daily. Was initially down to 20 mg but was increased back up to 30 mg daily a few weeks ago. Hasn't perceived much benefit from prednisone and feels like it could be making things worse.   Takes albuterol 2-3 times a day. It does seem to help. Not on any medications for reflux. Cough worse early in the morning and at night. Occasionally wakes him up from sleep.  Gained 15 lbs since starting prednisone. Having sweats as well.  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 It is physically demanding work.   I have reviewed the patient's family social and past medical history and updated as appropriate.   Past Medical History:  Diagnosis Date  . Anxiety    ocassional  . Atypical mole 08/24/2019   mild- right lower leg - anterior  . GERD (gastroesophageal reflux disease)   . Hypertension     Past Surgical History:  Procedure Laterality Date  . BRONCHOSCOPY    . CLEFT LIP REPAIR    . COLONOSCOPY    . HERNIA REPAIR Left   . LEFT HEART CATH AND CORONARY ANGIOGRAPHY N/A 08/23/2018   Procedure: LEFT HEART CATH AND CORONARY ANGIOGRAPHY;  Surgeon: Burnell Blanks, MD;  Location: [facility];  Service: Cardiovascular;  Laterality: N/A;  . MEDIASTINOSCOPY N/A 07/20/2019   Procedure: MEDIASTINOSCOPY;  Surgeon: Melrose Nakayama, MD;  Location: [facility];  Service: Thoracic;  Laterality: N/A;  . NASAL SEPTUM SURGERY    . PLANTAR FASCIA SURGERY Right   . VIDEO BRONCHOSCOPY WITH ENDOBRONCHIAL ULTRASOUND N/A 07/06/2019   Procedure: VIDEO BRONCHOSCOPY WITH ENDOBRONCHIAL ULTRASOUND;  Surgeon: Marshell Garfinkel, MD;  Location: [facility];  Service: Pulmonary;  Laterality: N/A;    Family History  Problem Relation Age of Onset  . Breast cancer Mother   . Heart disease Father   . Lung cancer Father     Social History   Occupational History  . Not on file  Tobacco Use  . Smoking status: Never Smoker  . Smokeless tobacco: Never Used  Vaping Use  . Vaping Use: Never assessed  Substance and Sexual Activity  . Alcohol use: Not Currently  . Drug use: Not Currently  . Sexual activity: Not on file     Physical Exam: Blood pressure 140/84, pulse 72, temperature (!) 96.9 F (36.1 C), height 5\' 7"  (1.702 m), weight 218 lb (98.9 kg), SpO2 100 %.  Gen:      No acute distress Lungs:    No increased respiratory effort, symmetric chest wall excursion, clear to  auscultation bilaterally, no wheezes or crackles CV:         Regular rate and rhythm; no murmurs, rubs, or gallops.  No pedal edema   Data Reviewed: Imaging: I have personally reviewed the PET scan may 2021   PFTs: None on file.    Labs: Lab Results  Component Value Date   WBC 4.7 07/20/2019   HGB 14.2 07/20/2019   HCT 42.7 07/20/2019   MCV 86.8 07/20/2019   PLT 303 07/20/2019   Lab Results  Component Value Date   NA 139 07/20/2019   K 3.8 07/20/2019   CL 105 07/20/2019   CO2 27 07/20/2019   Immunization status: Immunization History  Administered Date(s) Administered  . Influenza,inj,Quad PF,6+ Mos 11/25/2018, 10/23/2019  . Moderna SARS-COVID-2 Vaccination 03/28/2019,  04/25/2019   Assessment:  Pulmonary Sarcoidosis Cough  Plan/Recommendations: Continue prednisone Start PPI today for cough, likely related to GERD.   For the diagnosis of sarcoidosis I reivewed the following studies today:  Baseline eye examination to evaluate for ocular sarcoidosis - referral to ophthalmology placed Baseline serum creatinine for renal sarcoidosis - WNL Baseline serum alkaline phosphatase for hepatic sarcoidosis - WNL Baseline serum calcium - WNL Baseline serum complete blood count - WNL Baseline EKG for cardiac sarcoidosis - reviewed June 2021. No IV conduction delay.   Reference: Diagnosis and Detection of Sarcoidosis: An Official [place], Iss 8, pp e26-e51, May 17, 2018  I spent 46 minutes on 10/23/2019 in care of this patient including face to face time and non-face to face time spent charting, review of outside records, and coordination of care.  Return to Care: No follow-ups on file.   Lenice Llamas, MD Pulmonary and [place]

## 2019-10-24 ENCOUNTER — Telehealth: Payer: Self-pay | Admitting: Internal Medicine

## 2019-10-24 ENCOUNTER — Ambulatory Visit: Payer: 59 | Admitting: Psychologist

## 2019-10-24 NOTE — Telephone Encounter (Signed)
Rec'd completed form back from Dr. Shearon Stalls - faxed form with last ov per [facility] request. Form/records faxed to [phone]. -pr

## 2019-10-24 NOTE — Telephone Encounter (Signed)
Spoke with the pt  He is asking for a work note stating a definitive reason he needs to be out of work and a return date  Dr Shearon Stalls wanted him back in 2 months with PFT but only recall was put in bc schedule not out that far  Please advise, thanks!

## 2019-10-25 NOTE — Telephone Encounter (Signed)
Patrice, do you still have this?

## 2019-10-25 NOTE — Telephone Encounter (Signed)
Hi Leslie, I filled out paperwork from his employment two days ago and gave it to [place].

## 2019-10-26 NOTE — Telephone Encounter (Signed)
Paperwork already sent - I have a copy if needed -pr

## 2019-10-26 NOTE — Telephone Encounter (Signed)
I called patient and relayed information that the disability paperwork has to be filled out and prepared for the physician and then Dr. Shearon Stalls signs the paperwork. Patient verbalized understanding.

## 2019-10-30 NOTE — Telephone Encounter (Signed)
[name] wife checking on disability paperwork. [place] phone number is [phone].

## 2019-11-01 NOTE — Telephone Encounter (Signed)
Called and spoke to patient and his wife - pt needs a specific diagnosis sent to [facility]. Refaxed ov notes and form with diagnosis on the form. -pr

## 2019-11-12 ENCOUNTER — Telehealth: Payer: Self-pay | Admitting: Internal Medicine

## 2019-11-12 ENCOUNTER — Other Ambulatory Visit: Payer: Self-pay | Admitting: Primary Care

## 2019-11-12 NOTE — Telephone Encounter (Signed)
Dr. Shearon Stalls completed form to show patient can return to work with restrictions. Faxed to [employer] at [phone]. -pr

## 2019-11-13 ENCOUNTER — Other Ambulatory Visit: Payer: Self-pay

## 2019-11-13 ENCOUNTER — Ambulatory Visit (HOSPITAL_COMMUNITY)
Admission: RE | Admit: 2019-11-13 | Discharge: 2019-11-13 | Disposition: A | Discharge status: Home / Self Care | Payer: 59 | Source: Ambulatory Visit | Attending: Primary Care | Admitting: Primary Care

## 2019-11-13 DIAGNOSIS — D869 Sarcoidosis, unspecified: Secondary | ICD-10-CM | POA: Diagnosis not present

## 2019-11-14 NOTE — Telephone Encounter (Signed)
Dr. Desai, please see pt's mychart message and advise. °

## 2019-11-14 NOTE — Telephone Encounter (Signed)
Dr. Shearon Stalls - I have a copy of the form in my office - just let me know how many hours he can work and I can update the form and fax it back -pr

## 2019-11-16 NOTE — Telephone Encounter (Signed)
Rec'd update for work restrictions form via fax from [place]. Dr. Shearon Stalls will update at patient's next visit on 11/20/2019 - will give form to Dr. Shearon Stalls to update at his visit. -pr

## 2019-11-16 NOTE — Telephone Encounter (Signed)
Thanks he is coming in next week for appointment so we can discuss again.

## 2019-11-20 ENCOUNTER — Other Ambulatory Visit: Payer: Self-pay

## 2019-11-20 ENCOUNTER — Encounter: Payer: Self-pay | Admitting: Internal Medicine

## 2019-11-20 ENCOUNTER — Ambulatory Visit (INDEPENDENT_AMBULATORY_CARE_PROVIDER_SITE_OTHER): Payer: 59 | Admitting: Internal Medicine

## 2019-11-20 VITALS — BP 142/82 | HR 78 | Temp 97.8°F | Ht 68.0 in | Wt 215.5 lb

## 2019-11-20 DIAGNOSIS — K219 Gastro-esophageal reflux disease without esophagitis: Secondary | ICD-10-CM | POA: Diagnosis not present

## 2019-11-20 DIAGNOSIS — D860 Sarcoidosis of lung: Secondary | ICD-10-CM

## 2019-11-20 MED ORDER — OMEPRAZOLE 40 MG PO CPDR: 40.0000 mg | DELAYED_RELEASE_CAPSULE | Freq: Two times a day (BID) | ORAL | 11 refills | Status: DC

## 2019-11-20 NOTE — Progress Notes (Signed)
Mathew Moore    [id number]    1965-07-06  Primary Care Physician:Sasser, Silvestre Moment, MD Date of Appointment: 11/20/2019 Established Patient Visit  Chief complaint:   Chief Complaint  Patient presents with  . Follow-up    review results of the CT scan.  will come back in Nov for PFT and ROV.       HPI: Mathew Moore is a 54 y.o. gentleman who presented with Pet-Avid hilar adenopathy. 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   Interval Updates: Here for follow up today. Has seen Derl Barrow NP in the interim. Stated on prednisone as well and currently down to 30 mg daily. Was initially down to 20 mg but was increased back up to 30 mg daily a few weeks ago. Hasn't perceived much benefit from prednisone and feels like it could be making things worse.   Takes albuterol 2-3 times a day. It does seem to help. Not on any medications for reflux. Cough worse early in the morning and at night. Occasionally wakes him up from sleep.  Gained 15 lbs since starting prednisone. Having sweats as well.  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 It is physically demanding work.   I have reviewed the patient's family social and past medical history and updated as appropriate.   Past Medical History:  Diagnosis Date  . Anxiety    ocassional  . Atypical mole 08/24/2019   mild- right lower leg - anterior  . GERD (gastroesophageal reflux disease)   . Hypertension     Past Surgical History:  Procedure Laterality Date  . BRONCHOSCOPY    . CLEFT LIP REPAIR    . COLONOSCOPY    . HERNIA REPAIR Left   . LEFT HEART CATH AND CORONARY ANGIOGRAPHY N/A 08/23/2018   Procedure: LEFT HEART CATH AND CORONARY ANGIOGRAPHY;  Surgeon: Burnell Blanks, MD;  Location: [facility];  Service: Cardiovascular;  Laterality: N/A;  . MEDIASTINOSCOPY N/A 07/20/2019   Procedure:  MEDIASTINOSCOPY;  Surgeon: Melrose Nakayama, MD;  Location: [facility];  Service: Thoracic;  Laterality: N/A;  . NASAL SEPTUM SURGERY    . PLANTAR FASCIA SURGERY Right   . VIDEO BRONCHOSCOPY WITH ENDOBRONCHIAL ULTRASOUND N/A 07/06/2019   Procedure: VIDEO BRONCHOSCOPY WITH ENDOBRONCHIAL ULTRASOUND;  Surgeon: Marshell Garfinkel, MD;  Location: [facility];  Service: Pulmonary;  Laterality: N/A;    Family History  Problem Relation Age of Onset  . Breast cancer Mother   . Heart disease Father   . Lung cancer Father     Social History   Occupational History  . Not on file  Tobacco Use  . Smoking status: Never Smoker  . Smokeless tobacco: Never Used  Vaping Use  . Vaping Use: Never assessed  Substance and Sexual Activity  . Alcohol use: Not Currently  . Drug use: Not Currently  . Sexual activity: Not on file     Physical Exam: Blood pressure (!) 142/82, pulse 78, temperature 97.8 F (36.6 C), temperature source Oral, height 5\' 8"  (1.727 m), weight 215 lb 8 oz (97.8 kg), SpO2 97 %.  Gen:      No acute distress Lungs:    No increased respiratory effort, symmetric chest wall excursion, clear to auscultation bilaterally, no wheezes or crackles CV:         Regular rate and rhythm; no murmurs, rubs, or gallops.  No pedal edema   Data Reviewed: Imaging: I have personally reviewed the PET scan may 2021 and it shows reduced hilar lymphadenopathy.  PFTs: None on file.    Labs: Lab Results  Component Value Date   WBC 4.7 07/20/2019   HGB 14.2 07/20/2019   HCT 42.7 07/20/2019   MCV 86.8 07/20/2019   PLT 303 07/20/2019   Lab Results  Component Value Date   NA 139 07/20/2019   K 3.8 07/20/2019   CL 105 07/20/2019   CO2 27 07/20/2019   I reviewed his CBC, CMP and lipids on 10/14 from outside lab and they were WNL.   Immunization status: Immunization History  Administered Date(s) Administered  . Influenza,inj,Quad PF,6+ Mos 11/25/2018, 10/23/2019  . Moderna SARS-COVID-2  Vaccination 03/28/2019, 04/25/2019   Assessment:  Pulmonary Sarcoidosis Cough, likely GERD, not well controlled.  Plan/Recommendations: Continue prednisone 20 mg. Increase PPI to twice a day Although his CT scan shows some reduction in lymphadenopathy, he has not yet had pulmonary evaluation with PFTs to evaluate his dyspnea and chest heaviness.  He is still having ongoing night sweats.  I cannot make a complete opinion about how severe his pulmonary sarcoidosis is until I have reviewed his PFTs which she has scheduled to do next month.  It is possible he could have primarily airway involvement of his disease which would not show up on CT scan.  I think he should stay out of work until we are able to better quantify this.  For the diagnosis of sarcoidosis I reivewed the following studies today:  Baseline eye examination to evaluate for ocular sarcoidosis - referral to ophthalmology placed Baseline serum creatinine for renal sarcoidosis - WNL Baseline serum alkaline phosphatase for hepatic sarcoidosis - WNL Baseline serum calcium - WNL Baseline serum complete blood count - WNL Baseline EKG for cardiac sarcoidosis - reviewed June 2021. No IV conduction delay.   Fax number: [phone] [place]   Reference: Diagnosis and Detection of Sarcoidosis: An Official [employer], Iss 8, pp e26-e51, May 17, 2018    Return to Care: Return in about 4 weeks (around 12/18/2019).   Lenice Llamas, MD Pulmonary and [place]

## 2019-11-20 NOTE — Patient Instructions (Addendum)
Increase the prilosec to twice a day.  Try sleeping with a wedge pillow.   Follow up with me in a month after PFTs.  Follow up with your heard doctor.  What is GERD? Gastroesophageal reflux disease (GERD) is gastroesophageal reflux diseasewhich occurs when the lower esophageal sphincter (LES) opens spontaneously, for varying periods of time, or does not close properly and stomach contents rise up into the esophagus. GER is also called acid reflux or acid regurgitation, because digestive juices--called acids--rise up with the food. The esophagus is the tube that carries food from the mouth to the stomach. The LES is a ring of muscle at the bottom of the esophagus that acts like a valve between the esophagus and stomach.  When acid reflux occurs, food or fluid can be tasted in the back of the mouth. When refluxed stomach acid touches the lining of the esophagus it may cause a burning sensation in the chest or throat called heartburn or acid indigestion. Occasional reflux is common. Persistent reflux that occurs more than twice a week is considered GERD, and it can eventually lead to more serious health problems. People of all ages can have GERD. Studies have shown that GERD may worsen or contribute to asthma, chronic cough, and pulmonary fibrosis.   What are the symptoms of GERD? The main symptom of GERD in adults is frequent heartburn, also called acid indigestion--burning-type pain in the lower part of the mid-chest, behind the breast bone, and in the mid-abdomen.  Not all reflux is acidic in nature, and many patients don't have heart burn at all. Sometimes it feels like a cough (either dry or with mucus), choking sensation, asthma, shortness of breath, waking up at night, frequent throat clearing, or trouble swallowing.    What causes GERD? The reason some people develop GERD is still unclear. However, research shows that in people with GERD, the LES relaxes while the rest of the esophagus is  working. Anatomical abnormalities such as a hiatal hernia may also contribute to GERD. A hiatal hernia occurs when the upper part of the stomach and the LES move above the diaphragm, the muscle wall that separates the stomach from the chest. Normally, the diaphragm helps the LES keep acid from rising up into the esophagus. When a hiatal hernia is present, acid reflux can occur more easily. A hiatal hernia can occur in people of any age and is most often a normal finding in otherwise healthy people over age 37. Most of the time, a hiatal hernia produces no symptoms.   Other factors that may contribute to GERD include - Obesity or recent weight gain - Pregnancy  - Smoking  - Diet - Certain medications  Common foods that can worsen reflux symptoms include: - carbonated beverages - artificial sweeteners - citrus fruits  - chocolate  - drinks with caffeine or alcohol  - fatty and fried foods  - garlic and onions  - mint flavorings  - spicy foods  - tomato-based foods, like spaghetti sauce, salsa, chili, and pizza   Lifestyle Changes If you smoke, stop.  Avoid foods and beverages that worsen symptoms (see above.) Lose weight if needed.  Eat small, frequent meals.  Wear loose-fitting clothes.  Avoid lying down for 3 hours after a meal.  Raise the head of your bed 6 to 8 inches by securing wood blocks under the bedposts. Just using extra pillows will not help, but using a wedge-shaped pillow may be helpful.  Medications  H2 blockers, such as  cimetidine (Tagamet HB), famotidine (Pepcid AC), nizatidine (Axid AR), and ranitidine (Zantac 75), decrease acid production. They are available in prescription strength and over-the-counter strength. These drugs provide short-term relief and are effective for about half of those who have GERD symptoms.  Proton pump inhibitors include omeprazole (Prilosec, Zegerid), lansoprazole (Prevacid), pantoprazole (Protonix), rabeprazole (Aciphex), and esomeprazole  (Nexium), which are available by prescription. Prilosec is also available in over-the-counter strength. Proton pump inhibitors are more effective than H2 blockers and can relieve symptoms and heal the esophageal lining in almost everyone who has GERD.  Because drugs work in different ways, combinations of medications may help control symptoms. People who get heartburn after eating may take both antacids and H2 blockers. The antacids work first to neutralize the acid in the stomach, and then the H2 blockers act on acid production. By the time the antacid stops working, the H2 blocker will have stopped acid production. Your health care provider is the best source of information about how to use medications for GERD.   Points to Remember 1. You can have GERD without having heartburn. Your symptoms could include a dry cough, asthma symptoms, or trouble swallowing.  2. Taking medications daily as prescribed is important in controlling you symptoms.  Sometimes it can take up to 8 weeks to fully achieve the effects of the medications prescribed.  3. Coughing related to GERD can be difficult to treat and is very frustrating!  However, it is important to stick with these medications and lifestyle modifications before pursuing more aggressive or invasive test and treatments.

## 2019-11-21 NOTE — Telephone Encounter (Signed)
Per Dr. Mauricio Po last ov with patient - it was decided for patient to continue to be out of work until patient has pft, which is scheduled for 12/18/2019. Faxed back return work form with this information on it and Marliss Czar was sending the ov note separately to [facility]. -pr

## 2019-11-28 NOTE — Telephone Encounter (Signed)
Called and spoke to patient advised that Dr. Shearon Stalls and our group should continue to be in network with [facility]. I will follow up with Kathlee Nations if there is anything information to contrary I will contact him back. -pr

## 2019-12-10 ENCOUNTER — Inpatient Hospital Stay (HOSPITAL_COMMUNITY): Payer: 59 | Attending: Hematology | Admitting: Hematology

## 2019-12-10 ENCOUNTER — Other Ambulatory Visit: Payer: Self-pay

## 2019-12-10 VITALS — BP 160/91 | HR 72 | Temp 97.3°F | Resp 18 | Wt 215.9 lb

## 2019-12-10 DIAGNOSIS — K449 Diaphragmatic hernia without obstruction or gangrene: Secondary | ICD-10-CM | POA: Diagnosis not present

## 2019-12-10 DIAGNOSIS — R590 Localized enlarged lymph nodes: Secondary | ICD-10-CM | POA: Insufficient documentation

## 2019-12-10 DIAGNOSIS — R634 Abnormal weight loss: Secondary | ICD-10-CM | POA: Diagnosis not present

## 2019-12-10 DIAGNOSIS — R5383 Other fatigue: Secondary | ICD-10-CM | POA: Diagnosis not present

## 2019-12-10 DIAGNOSIS — M5144 Schmorl's nodes, thoracic region: Secondary | ICD-10-CM | POA: Insufficient documentation

## 2019-12-10 DIAGNOSIS — Z7952 Long term (current) use of systemic steroids: Secondary | ICD-10-CM | POA: Diagnosis not present

## 2019-12-10 DIAGNOSIS — Z8249 Family history of ischemic heart disease and other diseases of the circulatory system: Secondary | ICD-10-CM | POA: Diagnosis not present

## 2019-12-10 DIAGNOSIS — R0602 Shortness of breath: Secondary | ICD-10-CM | POA: Diagnosis not present

## 2019-12-10 DIAGNOSIS — Z803 Family history of malignant neoplasm of breast: Secondary | ICD-10-CM | POA: Insufficient documentation

## 2019-12-10 DIAGNOSIS — R059 Cough, unspecified: Secondary | ICD-10-CM | POA: Insufficient documentation

## 2019-12-10 DIAGNOSIS — E669 Obesity, unspecified: Secondary | ICD-10-CM | POA: Diagnosis not present

## 2019-12-10 DIAGNOSIS — R911 Solitary pulmonary nodule: Secondary | ICD-10-CM | POA: Diagnosis not present

## 2019-12-10 DIAGNOSIS — D869 Sarcoidosis, unspecified: Secondary | ICD-10-CM | POA: Insufficient documentation

## 2019-12-10 DIAGNOSIS — Z801 Family history of malignant neoplasm of trachea, bronchus and lung: Secondary | ICD-10-CM | POA: Insufficient documentation

## 2019-12-10 DIAGNOSIS — Z79899 Other long term (current) drug therapy: Secondary | ICD-10-CM | POA: Diagnosis not present

## 2019-12-10 NOTE — Patient Instructions (Signed)
[place] at [facility] Discharge Instructions  You were seen today by Dr. Delton Coombes. He went over your recent results and scans; your sarcoidosis is improving. Dr. Delton Coombes will see you back as needed for follow up.   Thank you for choosing [place] at [facility] to provide your oncology and hematology care.  To afford each patient quality time with our provider, please arrive at least 15 minutes before your scheduled appointment time.   If you have a lab appointment with the [facility] please come in thru the Main Entrance and check in at the main information desk  You need to re-schedule your appointment should you arrive 10 or more minutes late.  We strive to give you quality time with our providers, and arriving late affects you and other patients whose appointments are after yours.  Also, if you no show three or more times for appointments you may be dismissed from the clinic at the providers discretion.     Again, thank you for choosing [facility].  Our hope is that these requests will decrease the amount of time that you wait before being seen by our physicians.       _____________________________________________________________  Should you have questions after your visit to [facility], please contact our office at [phone] between the hours of 8:00 a.m. and 4:30 p.m.  Voicemails left after 4:00 p.m. will not be returned until the following business day.  For prescription refill requests, have your pharmacy contact our office and allow 72 hours.    Cancer Center Support Programs:   > Cancer Support Group  2nd Tuesday of the month 1pm-2pm, Journey Room

## 2019-12-10 NOTE — Progress Notes (Signed)
[place], [place] [address]   CLINIC:  Medical Oncology/Hematology  PCP:  Manon Hilding, MD [address] [place] [address]  [phone]  REASON FOR VISIT:  Follow-up for sarcoidosis  PRIOR THERAPY: None  CURRENT THERAPY: Prednisone 20 mg QD  INTERVAL HISTORY:  Mathew Moore, a 54 y.o. male, returns for routine follow-up for his sarcoidosis. Mathew Moore was last seen on 08/10/2019.  Today Mathew Moore reports feeling well. Mathew Moore is taking prednisone 20 mg daily and is tolerating it well. Mathew Moore has gained 12 lbs since June. Mathew Moore reports having a dry cough despite taking omeprazole; his cough is most prominent at night. Mathew Moore denies F/C or night sweats. Overall Mathew Moore feels better compared to 3 months ago, though Mathew Moore reports getting SOB after prolonged exertion and his energy levels are still not up to 100%.  Mathew Moore will have PFT and ophthalmology exam on 11/16.   REVIEW OF SYSTEMS:  Review of Systems  Constitutional: Positive for appetite change (50%) and fatigue (50%). Negative for chills and fever.  Respiratory: Positive for cough (dry) and shortness of breath (w/ exertion).     PAST MEDICAL/SURGICAL HISTORY:  Past Medical History:  Diagnosis Date  . Anxiety    ocassional  . Atypical mole 08/24/2019   mild- right lower leg - anterior  . GERD (gastroesophageal reflux disease)   . Hypertension    Past Surgical History:  Procedure Laterality Date  . BRONCHOSCOPY    . CLEFT LIP REPAIR    . COLONOSCOPY    . HERNIA REPAIR Left   . LEFT HEART CATH AND CORONARY ANGIOGRAPHY N/A 08/23/2018   Procedure: LEFT HEART CATH AND CORONARY ANGIOGRAPHY;  Surgeon: Burnell Blanks, MD;  Location: [facility];  Service: Cardiovascular;  Laterality: N/A;  . MEDIASTINOSCOPY N/A 07/20/2019   Procedure: MEDIASTINOSCOPY;  Surgeon: Melrose Nakayama, MD;  Location: [facility];  Service: Thoracic;  Laterality: N/A;  . NASAL SEPTUM SURGERY    . PLANTAR FASCIA SURGERY  Right   . VIDEO BRONCHOSCOPY WITH ENDOBRONCHIAL ULTRASOUND N/A 07/06/2019   Procedure: VIDEO BRONCHOSCOPY WITH ENDOBRONCHIAL ULTRASOUND;  Surgeon: Marshell Garfinkel, MD;  Location: [facility];  Service: Pulmonary;  Laterality: N/A;    SOCIAL HISTORY:  Social History   Socioeconomic History  . Marital status: Married    Spouse name: [name]  . Number of children: 2  . Years of education: Not on file  . Highest education level: Not on file  Occupational History  . Not on file  Tobacco Use  . Smoking status: Never Smoker  . Smokeless tobacco: Never Used  Vaping Use  . Vaping Use: Never assessed  Substance and Sexual Activity  . Alcohol use: Not Currently  . Drug use: Not Currently  . Sexual activity: Not on file  Other Topics Concern  . Not on file  Social History Narrative  . Not on file   Social Determinants of Health   Financial Resource Strain: Low Risk   . Difficulty of Paying Living Expenses: Not hard at all  Food Insecurity: No Food Insecurity  . Worried About Charity fundraiser in the Last Year: Never true  . Ran Out of Food in the Last Year: Never true  Transportation Needs: No Transportation Needs  . Lack of Transportation (Medical): No  . Lack of Transportation (Non-Medical): No  Physical Activity: Sufficiently Active  . Days of Exercise per Week: 4 days  . Minutes of Exercise  per Session: 40 min  Stress: No Stress Concern Present  . Feeling of Stress : Not at all  Social Connections: Moderately Integrated  . Frequency of Communication with Friends and Family: Three times a week  . Frequency of Social Gatherings with Friends and Family: Three times a week  . Attends Religious Services: More than 4 times per year  . Active Member of Clubs or Organizations: No  . Attends Archivist Meetings: Never  . Marital Status: Married  Human resources officer Violence: Not At Risk  . Fear of Current or Ex-Partner: No  . Emotionally Abused: No  . Physically Abused: No  .  Sexually Abused: No    FAMILY HISTORY:  Family History  Problem Relation Age of Onset  . Breast cancer Mother   . Heart disease Father   . Lung cancer Father     CURRENT MEDICATIONS:  Current Outpatient Medications  Medication Sig Dispense Refill  . acetaminophen (TYLENOL) 500 MG tablet Take 1,000 mg by mouth every 6 (six) hours as needed for moderate pain or headache.     . albuterol (VENTOLIN HFA) 108 (90 Base) MCG/ACT inhaler Inhale 1-2 puffs into the lungs every 6 (six) hours as needed for wheezing or shortness of breath. 18 g 1  . alum & mag hydroxide-simeth (MAALOX/MYLANTA) 200-200-20 MG/5ML suspension Take 15 mLs by mouth as needed for indigestion or heartburn.    . benzonatate (TESSALON) 200 MG capsule Take 1 capsule (200 mg total) by mouth 3 (three) times daily as needed for cough. 45 capsule 1  . calcium carbonate (TUMS - DOSED IN MG ELEMENTAL CALCIUM) 500 MG chewable tablet Chew 2 tablets by mouth every 4 (four) hours as needed for indigestion or heartburn.    . citalopram (CELEXA) 20 MG tablet Take 20 mg by mouth daily.    . clonazePAM (KLONOPIN) 0.5 MG tablet Take 0.5 mg by mouth 3 (three) times daily as needed for anxiety.    Marland Kitchen HYDROcodone-homatropine (HYCODAN) 5-1.5 MG/5ML syrup Take 5 mLs by mouth every 6 (six) hours as needed for cough. 240 mL 0  . losartan (COZAAR) 25 MG tablet Take 25 mg by mouth daily.    Marland Kitchen omeprazole (PRILOSEC) 40 MG capsule Take 1 capsule (40 mg total) by mouth in the morning and at bedtime. 60 capsule 11  . predniSONE (DELTASONE) 20 MG tablet Take 1 tablet by mouth once daily with breakfast 30 tablet 0  . zolpidem (AMBIEN) 10 MG tablet Take 10 mg by mouth at bedtime.      Current Facility-Administered Medications  Medication Dose Route Frequency Provider Last Rate Last Admin  . sodium chloride flush (NS) 0.9 % injection 3 mL  3 mL Intravenous Q12H Josue Hector, MD        ALLERGIES:  No Known Allergies  PHYSICAL EXAM:  Performance status  (ECOG): 0 - Asymptomatic  Vitals:   12/10/19 1158  BP: (!) 160/91  Pulse: 72  Resp: 18  Temp: (!) 97.3 F (36.3 C)  SpO2: 95%   Wt Readings from Last 3 Encounters:  12/10/19 215 lb 14.4 oz (97.9 kg)  11/20/19 215 lb 8 oz (97.8 kg)  10/23/19 218 lb (98.9 kg)   Physical Exam Vitals reviewed.  Constitutional:      Appearance: Normal appearance. Mathew Moore is obese.  Cardiovascular:     Rate and Rhythm: Normal rate and regular rhythm.     Pulses: Normal pulses.     Heart sounds: Normal heart sounds.  Pulmonary:  Effort: Pulmonary effort is normal.     Breath sounds: Normal breath sounds.  Abdominal:     Palpations: Abdomen is soft. There is no hepatomegaly, splenomegaly or mass.     Tenderness: There is no abdominal tenderness.     Hernia: No hernia is present.  Neurological:     General: No focal deficit present.     Mental Status: Mathew Moore is alert and oriented to person, place, and time.  Psychiatric:        Mood and Affect: Mood normal.        Behavior: Behavior normal.     LABORATORY DATA:  I have reviewed the labs as listed.  CBC Latest Ref Rng & Units 07/20/2019 07/06/2019 06/11/2019  WBC 4.0 - 10.5 K/uL 4.7 3.5(L) 3.9(L)  Hemoglobin 13.0 - 17.0 g/dL 14.2 14.3 15.2  Hematocrit 39 - 52 % 42.7 42.8 46.2  Platelets 150 - 400 K/uL 303 300 322   CMP Latest Ref Rng & Units 07/20/2019 07/06/2019 06/11/2019  Glucose 70 - 99 mg/dL 99 101(H) 99  BUN 6 - 20 mg/dL 14 14 16   Creatinine 0.61 - 1.24 mg/dL 0.96 0.86 0.99  Sodium 135 - 145 mmol/L 139 141 139  Potassium 3.5 - 5.1 mmol/L 3.8 4.2 4.1  Chloride 98 - 111 mmol/L 105 108 102  CO2 22 - 32 mmol/L 27 22 28   Calcium 8.9 - 10.3 mg/dL 9.0 9.1 9.4  Total Protein 6.5 - 8.1 g/dL 6.6 - 7.3  Total Bilirubin 0.3 - 1.2 mg/dL 0.9 - 0.8  Alkaline Phos 38 - 126 U/L 62 - 70  AST 15 - 41 U/L 20 - 19  ALT 0 - 44 U/L 22 - 25      Component Value Date/Time   RBC 4.92 07/20/2019 0716   MCV 86.8 07/20/2019 0716   MCH 28.9 07/20/2019 0716   MCHC  33.3 07/20/2019 0716   RDW 13.2 07/20/2019 0716   LYMPHSABS 1.2 06/11/2019 0929   MONOABS 0.3 06/11/2019 0929   EOSABS 0.1 06/11/2019 0929   BASOSABS 0.1 06/11/2019 0929    DIAGNOSTIC IMAGING:  I have independently reviewed the scans and discussed with the patient. CT CHEST WO CONTRAST  Result Date: 11/15/2019 CLINICAL DATA:  Sarcoidosis. Cough. Shortness of breath. EXAM: CT CHEST WITHOUT CONTRAST TECHNIQUE: Multidetector CT imaging of the chest was performed following the standard protocol without IV contrast. COMPARISON:  Chest radiograph 09/11/2019. PET CT 06/20/2019 FINDINGS: Cardiovascular: Heart is normal in size. Occasional coronary artery calcifications. The thoracic aorta is normal in caliber. There is no periaortic stranding. No pericardial effusion. Mediastinum/Nodes: Diminished mediastinal adenopathy from prior exam. Largest residual lymph node in the lower paratracheal station measures 10 mm, series 2, image 52. Decreased size of subcarinal node currently measuring 10 mm, previously 23 mm. Assessment for hilar adenopathy is limited in the absence of IV contrast. There is no axillary adenopathy. No visualized thyroid nodule. Tiny hiatal hernia, no esophageal wall thickening. Lungs/Pleura: The perifissural nodule in the left upper lobe has diminished in size from prior currently measuring 4 mm, series 4, image 47. This previously measured 7 mm. The previous ill-defined posterior right lower lobe nodular opacity on PET is no longer seen and has resolved. There is no new or acute airspace disease or pulmonary nodule. No reticular fibrotic changes. No pleural fluid. The trachea and central bronchi are patent. Upper Abdomen: No acute or unexpected findings. Musculoskeletal: There are no acute or suspicious osseous abnormalities. Scattered Schmorl's nodes in the thoracic spine.  IMPRESSION: 1. Diminished mediastinal adenopathy from prior exam. Residual lymph nodes are borderline in size measuring  10 mm short axis. 2. The perifissural left upper lobe nodule has diminished in size from prior exam, currently 4 mm previously 7 mm. The previous ill-defined posterior right lower lobe nodular opacity on PET is no longer seen and has resolved. 3. No new or acute findings. 4. Occasional coronary artery calcifications. Electronically Signed   By: Keith Rake M.D.   On: 11/15/2019 10:55     ASSESSMENT:  1.  Sarcoidosis of mediastinal and hilar adenopathy: -CT angiogram showed numerous enlarged mediastinal and bilateral hilar lymph nodes with no abdominal or pelvic adenopathy. -No fevers or night sweats. 14 pound weight loss in the last 1 and half month, trying to lose weight by dietary modification. -Colonoscopy 1 year ago reportedly normal, done in [place]. -Works in Personnel officer. No specific chemical exposure. -PET scan on 06/20/2019 showed mediastinal and hilar lymphadenopathy markedly hypermetabolic. 7 mm posterior left upper lobe nodule is hypermetabolic with SUV 4.6. No evidence of hypermetabolism in the neck, abdomen or pelvis. -Bronchoscopy and biopsy of station 7 lymph node on 07/06/2019 consistent with benign lymphoid tissue. -Mediastinoscopy and biopsy of the 4R lymph node by Dr. Roxan Hockey on 07/20/2019 consistent with noncaseating granuloma, sarcoidosis.  2. Family history: -Mother died of breast cancer. Father had lung cancer. Maternal aunt had breast cancer. -No family history of sarcoidosis.   PLAN:  1.  Sarcoidosis of mediastinal and hilar adenopathy: -Patient being followed by pulmonary. -Mathew Moore is continuing prednisone 20 mg daily for sarcoidosis. -CT chest without contrast on 11/13/2019 showed decreased mediastinal adenopathy, residual lymph nodes borderline in size measuring 10 mm in short axis.  Left upper lobe nodule has diminished in size. -Mathew Moore does report dry cough predominantly at nighttime.  Mathew Moore is on a PPI trial for possible acid reflux  contributing to the cough. -Mathew Moore has follow-up with Dr. Shearon Stalls. -At this time I will discharge him from the clinic.  We will be glad to see him on an as-needed basis.  Orders placed this encounter:  No orders of the defined types were placed in this encounter.    Derek Jack, MD [place] [phone]   I, Milinda Antis, am acting as a scribe for Dr. Sanda Linger.  I, Derek Jack MD, have reviewed the above documentation for accuracy and completeness, and I agree with the above.

## 2019-12-18 ENCOUNTER — Ambulatory Visit (INDEPENDENT_AMBULATORY_CARE_PROVIDER_SITE_OTHER): Payer: 59 | Admitting: Internal Medicine

## 2019-12-18 ENCOUNTER — Other Ambulatory Visit: Payer: Self-pay

## 2019-12-18 ENCOUNTER — Encounter: Payer: Self-pay | Admitting: Internal Medicine

## 2019-12-18 VITALS — BP 120/86 | HR 81 | Temp 97.7°F | Ht 67.5 in | Wt 220.0 lb

## 2019-12-18 DIAGNOSIS — D860 Sarcoidosis of lung: Secondary | ICD-10-CM

## 2019-12-18 DIAGNOSIS — R053 Chronic cough: Secondary | ICD-10-CM | POA: Diagnosis not present

## 2019-12-18 DIAGNOSIS — D869 Sarcoidosis, unspecified: Secondary | ICD-10-CM

## 2019-12-18 LAB — PULMONARY FUNCTION TEST
DL/VA % pred: 142 %
DL/VA: 6.33 ml/min/mmHg/L
DLCO cor % pred: 123 %
DLCO cor: 33.03 ml/min/mmHg
DLCO unc % pred: 123 %
DLCO unc: 33.03 ml/min/mmHg
FEF 25-75 Post: 3.51 L/sec
FEF 25-75 Pre: 3.72 L/sec
FEF2575-%Change-Post: -5 %
FEF2575-%Pred-Post: 111 %
FEF2575-%Pred-Pre: 118 %
FEV1-%Change-Post: 0 %
FEV1-%Pred-Post: 77 %
FEV1-%Pred-Pre: 77 %
FEV1-Post: 2.76 L
FEV1-Pre: 2.75 L
FEV1FVC-%Change-Post: -3 %
FEV1FVC-%Pred-Pre: 111 %
FEV6-%Change-Post: 4 %
FEV6-%Pred-Post: 75 %
FEV6-%Pred-Pre: 72 %
FEV6-Post: 3.32 L
FEV6-Pre: 3.18 L
FEV6FVC-%Pred-Post: 104 %
FEV6FVC-%Pred-Pre: 104 %
FVC-%Change-Post: 3 %
FVC-%Pred-Post: 72 %
FVC-%Pred-Pre: 69 %
FVC-Post: 3.32 L
FVC-Pre: 3.2 L
Post FEV1/FVC ratio: 83 %
Post FEV6/FVC ratio: 100 %
Pre FEV1/FVC ratio: 86 %
Pre FEV6/FVC Ratio: 100 %
RV % pred: 100 %
RV: 1.95 L
TLC % pred: 90 %
TLC: 5.86 L

## 2019-12-18 MED ORDER — PREDNISONE 20 MG PO TABS: ORAL_TABLET | ORAL | 2 refills | Status: DC

## 2019-12-18 NOTE — Addendum Note (Signed)
Addended by: Vanessa Barbara on: 12/18/2019 10:00 AM   Modules accepted: Orders

## 2019-12-18 NOTE — Progress Notes (Signed)
Full PFT performed today. °

## 2019-12-18 NOTE — Patient Instructions (Addendum)
The patient should have follow up scheduled with myself in 4 months.   Start taking 10 mg prednisone for the next week Then move to 10 mg prednisone every other day for another week. Then discontinue prednisone.  Keep taking reflux medicine.

## 2019-12-18 NOTE — Progress Notes (Signed)
Mathew Moore    [id number]    09-Nov-1965  Primary Care Physician:Sasser, Silvestre Moment, MD Date of Appointment: 12/18/2019 Established Patient Visit  Chief complaint:   Chief Complaint  Patient presents with  . Follow-up    pft results    HPI: Mathew Moore is a 54 y.o. gentleman who presented with Pet-Avid hilar adenopathy. 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  Was started on prednisone over the summer and has had a repeat CT scan which shows reduction in his mediastinal adenopathy.  Interval Updates: Here for follow up today.  Currently on 20 mg prednisone daily.  Hasn't perceived much benefit from prednisone and feels like it could be making things worse. Takes albuterol 2-3 times a day. It does seem to help.  Gained about 20 pounds and starting prednisone.  Having sweats.  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 It is physically demanding work and given his degree of diaphoresis, flushing, dyspnea with minimal exertion he has been unable to go to work.  On further review it seems that his symptoms of dyspnea are most likely related to elevated blood pressure in the spring 2021 which resulted in this CT scan showing PET avid lymphadenopathy.  I have reviewed the patient's family social and past medical history and updated as appropriate.   Past Medical History:  Diagnosis Date  . Anxiety    ocassional  . Atypical mole 08/24/2019   mild- right lower leg - anterior  . GERD (gastroesophageal reflux disease)   . Hypertension     Past Surgical History:  Procedure Laterality Date  . BRONCHOSCOPY    . CLEFT LIP REPAIR    . COLONOSCOPY    . HERNIA REPAIR Left   . LEFT HEART CATH AND CORONARY ANGIOGRAPHY N/A 08/23/2018   Procedure: LEFT HEART CATH AND CORONARY ANGIOGRAPHY;  Surgeon: Burnell Blanks, MD;  Location: [facility];   Service: Cardiovascular;  Laterality: N/A;  . MEDIASTINOSCOPY N/A 07/20/2019   Procedure: MEDIASTINOSCOPY;  Surgeon: Melrose Nakayama, MD;  Location: [facility];  Service: Thoracic;  Laterality: N/A;  . NASAL SEPTUM SURGERY    . PLANTAR FASCIA SURGERY Right   . VIDEO BRONCHOSCOPY WITH ENDOBRONCHIAL ULTRASOUND N/A 07/06/2019   Procedure: VIDEO BRONCHOSCOPY WITH ENDOBRONCHIAL ULTRASOUND;  Surgeon: Marshell Garfinkel, MD;  Location: [facility];  Service: Pulmonary;  Laterality: N/A;    Family History  Problem Relation Age of Onset  . Breast cancer Mother   . Heart disease Father   . Lung cancer Father     Social History   Occupational History  . Not on file  Tobacco Use  . Smoking status: Never Smoker  . Smokeless tobacco: Never Used  Vaping Use  . Vaping Use: Never assessed  Substance and Sexual Activity  . Alcohol use: Not Currently  . Drug use: Not Currently  . Sexual activity: Not on file     Physical Exam: Blood pressure 120/86, pulse 81, temperature 97.7 F (36.5 C), height 5' 7.5" (1.715 m), weight 220 lb (99.8 kg), SpO2 99 %.  Gen:      No acute distress Lungs:    No increased respiratory effort, symmetric chest wall excursion, clear to auscultation bilaterally, no wheezes or crackles CV:         Regular rate and rhythm; no murmurs, rubs, or gallops.  No pedal edema  Data Reviewed: Imaging: I have personally reviewed the PET scan may 2021 and it shows reduced hilar lymphadenopathy.  PFTs: None on file.    Labs: Lab Results  Component Value Date   WBC 4.7 07/20/2019   HGB 14.2 07/20/2019   HCT 42.7 07/20/2019   MCV 86.8 07/20/2019   PLT 303 07/20/2019   Lab Results  Component Value Date   NA 139 07/20/2019   K 3.8 07/20/2019   CL 105 07/20/2019   CO2 27 07/20/2019   I reviewed his CBC, CMP and lipids on 10/14 from outside lab and they were WNL.   Immunization status: Immunization History  Administered Date(s) Administered  . Influenza,inj,Quad PF,6+ Mos  11/25/2018, 10/23/2019  . Moderna SARS-COVID-2 Vaccination 03/28/2019, 04/25/2019   Assessment:  Pulmonary Sarcoidosis Cough, likely GERD, not well controlled.  Plan/Recommendations: Continue prednisone 20 mg. Continue PPI twice a day.  Continue GERD precautions. I reviewed his PFTs today with him.  He has normal spirometry, lung volumes and diffusion capacity.  Upon further discussion with the patient, it seems of the prednisone is actually making his worse.  We will start to taper this down today.  I think that his episodes of diaphoresis and flushing are likely related to prednisone.  I think he likely can go back to work once his prednisone is off in the next 2 weeks.  I recommend he go back to 50% of his original duties for the first 2 weeks, and then can resume full duties. Continue albuterol as needed.  Sarcoidosis evaluation: Baseline eye examination to evaluate for ocular sarcoidosis -soft neurology earlier this month. Baseline serum creatinine for renal sarcoidosis - WNL Baseline serum alkaline phosphatase for hepatic sarcoidosis - WNL Baseline serum calcium - WNL Baseline serum complete blood count - WNL Baseline EKG for cardiac sarcoidosis - reviewed June 2021. No IV conduction delay.   Reference: Diagnosis and Detection of Sarcoidosis: An Official [employer] [place], Iss 8, pp [id number], May 17, 2018   Return to Care: Return in about 4 months (around 04/16/2020).   Lenice Llamas, MD Pulmonary and [place]

## 2019-12-25 ENCOUNTER — Ambulatory Visit: Payer: 59 | Admitting: Internal Medicine

## 2020-01-02 ENCOUNTER — Telehealth: Payer: Self-pay | Admitting: Internal Medicine

## 2020-01-02 NOTE — Telephone Encounter (Signed)
Dr Shearon Stalls, please advise on email from pt, thanks!  Hey my name is Mathew Moore and my disability people didn't understand my going back to work notes. They want to know is it 50 % hours and 50 % duties or just 50% hours. Then how many weeks does this last . Any questions you can call me [phone]. Please send the notes to me and [employer]ex they handle my disability.   Thanks Mathew Moore

## 2020-01-02 NOTE — Telephone Encounter (Signed)
Rec'd physician statement of disability via fax from [place]. Prepared form- will give to Dr. Shearon Stalls on 01/07/2020 when she returns to the office - pr

## 2020-01-03 NOTE — Telephone Encounter (Addendum)
Hi I believe it was 50% hours and duties for 2 weeks and then resuming full duties and hours. Will loop in patrice because she had handled his paperwork.

## 2020-01-08 NOTE — Telephone Encounter (Signed)
Rec'd completed paperwork - faxed to [facility]. -pr

## 2020-01-11 NOTE — Progress Notes (Signed)
.    Cardiology Office Note    Date:  01/21/2020   ID:  Mathew Moore, DOB 1965-08-28, MRN [id number]  PCP:  Mathew Hilding, MD  Cardiologist: Mathew Rouge, MD EPS: None  No chief complaint on file.   History of Present Illness:  Mathew Moore is a 54 y.o. male with history chest pain, false positive NST 08/17/2018 of mild nonobstructive CAD on cath 08/23/18 Seen in ER 06/01/19 with chest pain r/o no ECG changes CT with mediastinal adenopathy with subsequent positive PET and biopsy showing ? Sarcoid. Rx with prednisone now tapering with reduction in adenopathy by CT 11/15/19 but limited clinical improvement so steroids tapered by Mathew Moore pulmonary Does not appear to have CNS/Occular/Renal involvement and ECG with no conduction disease   PFTls/ DLCO normal  BP elevated on steroids and on cozaar   Still with some cough ? Due to reflux. Currently not on RX as steroids tapered off  Discussed having cardiac MRI to r/o cardiac involvement   Past Medical History:  Diagnosis Date  . Anxiety    ocassional  . Atypical mole 08/24/2019   mild- right lower leg - anterior  . GERD (gastroesophageal reflux disease)   . Hypertension   . Sarcoidosis 07/2019    Past Surgical History:  Procedure Laterality Date  . BRONCHOSCOPY    . CLEFT LIP REPAIR    . COLONOSCOPY    . HERNIA REPAIR Left   . LEFT HEART CATH AND CORONARY ANGIOGRAPHY N/A 08/23/2018   Procedure: LEFT HEART CATH AND CORONARY ANGIOGRAPHY;  Surgeon: Burnell Blanks, MD;  Location: [facility];  Service: Cardiovascular;  Laterality: N/A;  . MEDIASTINOSCOPY N/A 07/20/2019   Procedure: MEDIASTINOSCOPY;  Surgeon: Melrose Nakayama, MD;  Location: [facility];  Service: Thoracic;  Laterality: N/A;  . NASAL SEPTUM SURGERY    . PLANTAR FASCIA SURGERY Right   . VIDEO BRONCHOSCOPY WITH ENDOBRONCHIAL ULTRASOUND N/A 07/06/2019   Procedure: VIDEO BRONCHOSCOPY WITH ENDOBRONCHIAL ULTRASOUND;  Surgeon: Marshell Garfinkel, MD;   Location: [facility];  Service: Pulmonary;  Laterality: N/A;    Current Medications: Current Meds  Medication Sig  . acetaminophen (TYLENOL) 500 MG tablet Take 1,000 mg by mouth every 6 (six) hours as needed for moderate pain or headache.   . albuterol (VENTOLIN HFA) 108 (90 Base) MCG/ACT inhaler Inhale 1-2 puffs into the lungs every 6 (six) hours as needed for wheezing or shortness of breath.  Marland Kitchen alum & mag hydroxide-simeth (MAALOX/MYLANTA) 200-200-20 MG/5ML suspension Take 15 mLs by mouth as needed for indigestion or heartburn.  Renae Gloss 2 MG/24HR PT24 Apply topically.  . citalopram (CELEXA) 20 MG tablet Take 20 mg by mouth daily.  . clonazePAM (KLONOPIN) 0.5 MG tablet Take 0.5 mg by mouth 3 (three) times daily as needed for anxiety.  Marland Kitchen losartan (COZAAR) 25 MG tablet Take 25 mg by mouth daily.  Marland Kitchen omeprazole (PRILOSEC) 40 MG capsule Take 1 capsule (40 mg total) by mouth in the morning and at bedtime.  Marland Kitchen zolpidem (AMBIEN) 10 MG tablet Take 10 mg by mouth at bedtime.    Current Facility-Administered Medications for the 01/21/20 encounter (Office Visit) with Mathew Hector, MD  Medication  . sodium chloride flush (NS) 0.9 % injection 3 mL     Allergies:   Patient has no known allergies.   Social History   Socioeconomic History  . Marital status: Married    Spouse name: Mathew Moore  . Number of children: 2  .  Years of education: Not on file  . Highest education level: Not on file  Occupational History  . Not on file  Tobacco Use  . Smoking status: Never Smoker  . Smokeless tobacco: Never Used  Vaping Use  . Vaping Use: Never used  Substance and Sexual Activity  . Alcohol use: Not Currently  . Drug use: Not Currently  . Sexual activity: Not on file  Other Topics Concern  . Not on file  Social History Narrative  . Not on file   Social Determinants of Health   Financial Resource Strain: Low Risk   . Difficulty of Paying Living Expenses: Not hard at all  Food Insecurity: No Food  Insecurity  . Worried About Charity fundraiser in the Last Year: Never true  . Ran Out of Food in the Last Year: Never true  Transportation Needs: No Transportation Needs  . Lack of Transportation (Medical): No  . Lack of Transportation (Non-Medical): No  Physical Activity: Sufficiently Active  . Days of Exercise per Week: 4 days  . Minutes of Exercise per Session: 40 min  Stress: No Stress Concern Present  . Feeling of Stress : Not at all  Social Connections: Moderately Integrated  . Frequency of Communication with Friends and Family: Three times a week  . Frequency of Social Gatherings with Friends and Family: Three times a week  . Attends Religious Services: More than 4 times per year  . Active Member of Clubs or Organizations: No  . Attends Archivist Meetings: Never  . Marital Status: Married     Family History:  The patient's   family history includes Breast cancer in his mother; Heart disease in his father; Lung cancer in his father.   ROS:   Please see the history of present illness.    ROS All other systems reviewed and are negative.   PHYSICAL EXAM:   VS:  BP (!) 146/96   Pulse 77   Ht 5\' 8"  (1.727 m)   Wt 100.7 kg   SpO2 98%   BMI 33.75 kg/m   Physical Exam  GEN: Well nourished, well developed, in no acute distress  Neck: no JVD, carotid bruits, or masses Cardiac:RRR; S4 no murmurs, rubs  Respiratory:  clear to auscultation bilaterally, normal work of breathing GI: soft, nontender, nondistended, + BS Ext: without cyanosis, clubbing, or edema, Good distal pulses bilaterally Neuro:  Alert and Oriented x 3 Psych: euthymic mood, full affect  Wt Readings from Last 3 Encounters:  01/21/20 100.7 kg  12/18/19 99.8 kg  12/10/19 97.9 kg      Studies/Labs Reviewed:   EKG:  EKG is not ordered today.  The ekg reviewed from 06/01/19 NSR, left axis no change Recent Labs: 07/20/2019: ALT 22; BUN 14; Creatinine, Ser 0.96; Hemoglobin 14.2; Platelets 303;  Potassium 3.8; Sodium 139   Lipid Panel No results found for: CHOL, TRIG, HDL, CHOLHDL, VLDL, LDLCALC, LDLDIRECT  Additional studies/ records that were reviewed today include:   CT 06/01/19 Fri Jun 01, 2019 1455 Ct chest didn't cross into epic yet -  IMPRESSION: 1. Normal contour and caliber of the thoracic and abdominal aorta without evidence of aneurysm, dissection, or other acute aortic syndrome. Mild atherosclerosis. Aortic Atherosclerosis (ICD10-I70.0).   2. Numerous enlarged mediastinal and bilateral hilar lymph nodes, of uncertain nature although concerning for malignancy given size, including metastatic disease or lymphoma. Non malignant differential considerations include sarcoidosis. These may be characterized for metabolic activity by FDG PET.  3. Nonspecific 7 mm fissural pulmonary nodule of the left upper lobe. This nodule may be characterized for metabolic activity by PET-CT in conjunction with above.   4. Coronary artery disease.   5. Hepatic steatosis.   [MB]  Cardiac catheterization 08/23/2018  Mid RCA lesion is 20% stenosed.  Mid Cx lesion is 10% stenosed.  Mid LAD lesion is 30% stenosed.   1. Mild non-obstructive CAD 2. Normal filling pressures   Recommendations: Medical management of mild CAD   NST 08/17/2018  There was no ST segment deviation noted during stress.  Findings consistent with prior large inferior myocardial infarction with moderate peri-infarct ischemia and medium sized anterior infarct with moderate peri-infarct ischemia.  The left ventricular ejection fraction is normal (55-65%).  Moderate peri-infarct ischemia in two seperate territories would suggest high risk for major cardiac events.       ASSESSMENT:    No diagnosis found.   PLAN:  In order of problems listed above:  CAD mild nonobstructive on cardiac cath 08/23/2018 after abnormal stress test Atypical pain April with negative ECG changes / troponin  Observe  HTN:  Improved on losartan   Oncology:  Mediastinal lymphadenopathy with positive PET Scan Mediastinoscopy with non caseating granuloma RX prednisone for sarcoid with reduction in nodes by CT Steroids tapered by pulmonary due to weight gain Not clear what f/u imaging or Rx plan is f/u with pulmonary will will order cardiac MRI to r/o myocardial involvement ECG is ok with no AV block   Medication Adjustments/Labs and Tests Ordered:  BMET Cardiac MRI r/o Sarcoid F/U in 6 months   Signed, Mathew Rouge, MD  01/21/2020 8:55 AM    [place] [place], [place],   [address] Phone: [phone]; Fax: [phone]

## 2020-01-21 ENCOUNTER — Ambulatory Visit (INDEPENDENT_AMBULATORY_CARE_PROVIDER_SITE_OTHER): Payer: 59 | Admitting: Cardiovascular Disease

## 2020-01-21 ENCOUNTER — Other Ambulatory Visit (HOSPITAL_COMMUNITY)
Admission: RE | Admit: 2020-01-21 | Discharge: 2020-01-21 | Disposition: A | Discharge status: Home / Self Care | Payer: 59 | Source: Ambulatory Visit | Attending: Cardiovascular Disease | Admitting: Cardiovascular Disease

## 2020-01-21 ENCOUNTER — Other Ambulatory Visit: Payer: Self-pay

## 2020-01-21 ENCOUNTER — Telehealth: Payer: Self-pay | Admitting: Cardiovascular Disease

## 2020-01-21 ENCOUNTER — Encounter: Payer: Self-pay | Admitting: Cardiovascular Disease

## 2020-01-21 VITALS — BP 146/96 | HR 77 | Ht 68.0 in | Wt 222.0 lb

## 2020-01-21 DIAGNOSIS — D869 Sarcoidosis, unspecified: Secondary | ICD-10-CM

## 2020-01-21 LAB — BASIC METABOLIC PANEL
Anion gap: 7 (ref 5–15)
BUN: 15 mg/dL (ref 6–20)
CO2: 27 mmol/L (ref 22–32)
Calcium: 9.2 mg/dL (ref 8.9–10.3)
Chloride: 105 mmol/L (ref 98–111)
Creatinine, Ser: 0.96 mg/dL (ref 0.61–1.24)
GFR, Estimated: >60 mL/min (ref >60)
Glucose, Bld: 106 mg/dL — ABNORMAL HIGH (ref 70–99)
Potassium: 4.3 mmol/L (ref 3.5–5.1)
Sodium: 139 mmol/L (ref 135–145)

## 2020-01-21 NOTE — Addendum Note (Signed)
Addended by: Levonne Hubert on: 01/21/2020 02:28 PM   Modules accepted: Orders

## 2020-01-21 NOTE — Telephone Encounter (Signed)
Spoke with patient regarding appointment for Cardiac MRi scheduled 01/24/20 at 8:00 am---arrival time is 7:30 am--1st floor admissions office at [facility]----patient voiced his understanding.

## 2020-01-21 NOTE — Patient Instructions (Signed)
Medication Instructions:  Your physician recommends that you continue on your current medications as directed. Please refer to the Current Medication list given to you today.  *If you need a refill on your cardiac medications before your next appointment, please call your pharmacy*   Lab Work: Your physician recommends that you return for lab work in: BMET   If you have labs (blood work) drawn today and your tests are completely normal, you will receive your results only by:  MyChart Message (if you have MyChart) OR  A paper copy in the mail If you have any lab test that is abnormal or we need to change your treatment, we will call you to review the results.   Testing/Procedures: Your physician has requested that you have a cardiac MRI. Cardiac MRI uses a computer to create images of your heart as its beating, producing both still and moving pictures of your heart and major blood vessels. For further information please visit [email or web address] Please follow the instruction sheet given to you today for more information.    Follow-Up: At [facility], you and your health needs are our priority.  As part of our continuing mission to provide you with exceptional heart care, we have created designated Provider Care Teams.  These Care Teams include your primary Cardiologist (physician) and Advanced Practice Providers (APPs -  Physician Assistants and Nurse Practitioners) who all work together to provide you with the care you need, when you need it.  We recommend signing up for the patient portal called "MyChart".  Sign up information is provided on this After Visit Summary.  MyChart is used to connect with patients for Virtual Visits (Telemedicine).  Patients are able to view lab/test results, encounter notes, upcoming appointments, etc.  Non-urgent messages can be sent to your provider as well.   To learn more about what you can do with MyChart, go to [email or web address].    Your next  appointment:   6 month(s)  The format for your next appointment:   In Person  Provider:   Jenkins Rouge, MD   Other Instructions Thank you for choosing [facility]!

## 2020-01-21 NOTE — Telephone Encounter (Signed)
Left message for patient to call regarding appointment for Cardiac MRI scheduled THursday 01/24/20 at 8:00 am---arrival time is 7:30 am---1st floor admissions office at [facility]---

## 2020-01-22 ENCOUNTER — Telehealth (HOSPITAL_COMMUNITY): Payer: Self-pay | Admitting: *Deleted

## 2020-01-22 NOTE — Telephone Encounter (Signed)
Reaching out to patient to offer assistance regarding upcoming cardiac imaging study; pt verbalizes understanding of appt date/time, parking situation and where to check in, and verified current allergies; name and call back number provided for further questions should they arise  [facility] [phone] office [phone] cell  Pt verbalized understanding that he will need a driver if he takes is home klonopin in preparation for the test.

## 2020-01-24 ENCOUNTER — Other Ambulatory Visit: Payer: Self-pay | Admitting: Cardiovascular Disease

## 2020-01-24 ENCOUNTER — Other Ambulatory Visit: Payer: Self-pay

## 2020-01-24 ENCOUNTER — Ambulatory Visit (HOSPITAL_COMMUNITY)
Admission: RE | Admit: 2020-01-24 | Discharge: 2020-01-24 | Disposition: A | Discharge status: Home / Self Care | Payer: 59 | Source: Ambulatory Visit | Attending: Cardiovascular Disease | Admitting: Cardiovascular Disease

## 2020-01-24 DIAGNOSIS — D869 Sarcoidosis, unspecified: Secondary | ICD-10-CM

## 2020-01-24 MED ORDER — GADOBUTROL 1 MMOL/ML IV SOLN
10.0000 mL | Freq: Once | INTRAVENOUS | Status: AC | PRN
  Administered 2020-01-24: 10 mL via INTRAVENOUS

## 2020-01-25 ENCOUNTER — Other Ambulatory Visit: Payer: Self-pay | Admitting: Primary Care

## 2020-04-15 ENCOUNTER — Encounter: Payer: Self-pay | Admitting: Internal Medicine

## 2020-04-15 ENCOUNTER — Ambulatory Visit: Payer: 59 | Admitting: Internal Medicine

## 2020-04-15 ENCOUNTER — Other Ambulatory Visit: Payer: Self-pay

## 2020-04-15 VITALS — BP 118/78 | HR 66 | Temp 97.3°F | Ht 67.0 in | Wt 212.8 lb

## 2020-04-15 DIAGNOSIS — D860 Sarcoidosis of lung: Secondary | ICD-10-CM | POA: Diagnosis not present

## 2020-04-15 NOTE — Progress Notes (Signed)
Mathew Moore    [id number]    1965-11-02  Primary Care Physician:Sasser, Silvestre Moment, MD Date of Appointment: 04/15/2020 Established Patient Visit  Chief complaint:   Chief Complaint  Patient presents with  . Follow-up    SOB at times    HPI: DEONDRAE Moore is a 55 y.o. gentleman who presented with Pet-Avid hilar adenopathy. 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  Was started on prednisone over the summer and has had a repeat CT scan which shows reduction in his mediastinal adenopathy. Prednisone made his symptoms worse so he was tapered off this in December 2021.   Interval Updates: Off prednisone. Back to work. He was out over 8 months but is over all doing much better. Uses albuterol less than once/day. Usually this is at home when he's more active in the yard, rather than at work.  No fevers chills night sweats, weight loss.  On a new blood pressure medicine now - indapamide due to high spikes.   I have reviewed the patient's family social and past medical history and updated as appropriate.   Past Medical History:  Diagnosis Date  . Anxiety    ocassional  . Atypical mole 08/24/2019   mild- right lower leg - anterior  . GERD (gastroesophageal reflux disease)   . Hypertension   . Sarcoidosis 07/2019    Past Surgical History:  Procedure Laterality Date  . BRONCHOSCOPY    . CLEFT LIP REPAIR    . COLONOSCOPY    . HERNIA REPAIR Left   . LEFT HEART CATH AND CORONARY ANGIOGRAPHY N/A 08/23/2018   Procedure: LEFT HEART CATH AND CORONARY ANGIOGRAPHY;  Surgeon: Burnell Blanks, MD;  Location: [facility];  Service: Cardiovascular;  Laterality: N/A;  . MEDIASTINOSCOPY N/A 07/20/2019   Procedure: MEDIASTINOSCOPY;  Surgeon: Melrose Nakayama, MD;  Location: [facility];  Service: Thoracic;  Laterality: N/A;  . NASAL SEPTUM SURGERY    . PLANTAR FASCIA  SURGERY Right   . VIDEO BRONCHOSCOPY WITH ENDOBRONCHIAL ULTRASOUND N/A 07/06/2019   Procedure: VIDEO BRONCHOSCOPY WITH ENDOBRONCHIAL ULTRASOUND;  Surgeon: Marshell Garfinkel, MD;  Location: [facility];  Service: Pulmonary;  Laterality: N/A;    Family History  Problem Relation Age of Onset  . Breast cancer Mother   . Heart disease Father   . Lung cancer Father     Social History   Occupational History  . Not on file  Tobacco Use  . Smoking status: Never Smoker  . Smokeless tobacco: Never Used  Vaping Use  . Vaping Use: Never used  Substance and Sexual Activity  . Alcohol use: Not Currently  . Drug use: Not Currently  . Sexual activity: Not on file     Physical Exam: Blood pressure 118/78, pulse 66, temperature (!) 97.3 F (36.3 C), temperature source Temporal, height 5\' 7"  (1.702 m), weight 212 lb 12.8 oz (96.5 kg), SpO2 98 %.  Gen:      Well appearing, no distress Lungs:    Ctab, no wheezes or crackles, no increased wob CV:        RRR no edema   Data Reviewed: Imaging: I have personally reviewed the PET scan may 2021 and it shows reduced hilar lymphadenopathy. Cardiac MRI - no evidence of delayed gadolinium enhancement or scarring consistent with cardiac sarcoidosis   PFTs: None on file.   Labs: BMP 12/20 reviewed - Cr  0.96  Immunization status: Immunization History  Administered Date(s) Administered  . Influenza,inj,Quad PF,6+ Mos 11/25/2018, 10/23/2019  . Moderna Sars-Covid-2 Vaccination 03/28/2019, 04/25/2019   Reviewed notes from Dr. Johnsie Cancel, cardiology  Assessment:  Pulmonary Sarcoidosis - completed prednisone course. In remission.  Cough, GERD - improved.   Plan/Recommendations: Continue albuterol as needed. Continue to monitor off prednisone.  Will repeat PFTs in 6 months.    Sarcoidosis evaluation: Baseline eye examination to evaluate for ocular sarcoidosis -saw ophthalmology fall 2021.  Baseline serum creatinine for renal sarcoidosis - WNL Baseline  serum alkaline phosphatase for hepatic sarcoidosis - WNL Baseline serum calcium - WNL Baseline serum complete blood count - WNL Baseline EKG for cardiac sarcoidosis - reviewed June 2021. No IV conduction delay.   Reference: Diagnosis and Detection of Sarcoidosis: An Official American [place], Iss 8, pp e26-e51, May 17, 2018  I spent 30 minutes in the care of this patient today including pre-charting, chart review, review of results, face-to-face care, coordination of care and communication with consultants etc.).   Return to Care: Return in about 6 months (around 10/16/2020).   Mathew Llamas, MD Pulmonary and [place]

## 2020-04-15 NOTE — Patient Instructions (Addendum)
The patient should have follow up scheduled with myself in 6 months.   Prior to next visit patient should have: Spirometry  We will continue to monitor your symptoms off prednisone. We will get breathing testing in six months.

## 2020-05-07 ENCOUNTER — Emergency Department (HOSPITAL_COMMUNITY): Payer: 59

## 2020-05-07 ENCOUNTER — Emergency Department (HOSPITAL_COMMUNITY)
Admission: EM | Admit: 2020-05-07 | Discharge: 2020-05-07 | Disposition: A | Discharge status: Home / Self Care | Payer: 59 | Attending: Emergency Medicine | Admitting: Emergency Medicine

## 2020-05-07 ENCOUNTER — Encounter (HOSPITAL_COMMUNITY): Payer: Self-pay

## 2020-05-07 DIAGNOSIS — Z79899 Other long term (current) drug therapy: Secondary | ICD-10-CM | POA: Insufficient documentation

## 2020-05-07 DIAGNOSIS — R072 Precordial pain: Secondary | ICD-10-CM | POA: Insufficient documentation

## 2020-05-07 DIAGNOSIS — R079 Chest pain, unspecified: Secondary | ICD-10-CM

## 2020-05-07 DIAGNOSIS — I2510 Atherosclerotic heart disease of native coronary artery without angina pectoris: Secondary | ICD-10-CM | POA: Insufficient documentation

## 2020-05-07 DIAGNOSIS — R42 Dizziness and giddiness: Secondary | ICD-10-CM | POA: Diagnosis not present

## 2020-05-07 DIAGNOSIS — I10 Essential (primary) hypertension: Secondary | ICD-10-CM | POA: Diagnosis not present

## 2020-05-07 DIAGNOSIS — Z955 Presence of coronary angioplasty implant and graft: Secondary | ICD-10-CM | POA: Diagnosis not present

## 2020-05-07 DIAGNOSIS — R55 Syncope and collapse: Secondary | ICD-10-CM

## 2020-05-07 LAB — COMPREHENSIVE METABOLIC PANEL
ALT: 26 U/L (ref 0–44)
AST: 27 U/L (ref 15–41)
Albumin: 4 g/dL (ref 3.5–5.0)
Alkaline Phosphatase: 71 U/L (ref 38–126)
Anion gap: 8 (ref 5–15)
BUN: 23 mg/dL — ABNORMAL HIGH (ref 6–20)
CO2: 25 mmol/L (ref 22–32)
Calcium: 9.2 mg/dL (ref 8.9–10.3)
Chloride: 106 mmol/L (ref 98–111)
Creatinine, Ser: 1.23 mg/dL (ref 0.61–1.24)
GFR, Estimated: >60 mL/min (ref >60)
Glucose, Bld: 108 mg/dL — ABNORMAL HIGH (ref 70–99)
Potassium: 3.5 mmol/L (ref 3.5–5.1)
Sodium: 139 mmol/L (ref 135–145)
Total Bilirubin: 0.8 mg/dL (ref 0.3–1.2)
Total Protein: 6.4 g/dL — ABNORMAL LOW (ref 6.5–8.1)

## 2020-05-07 LAB — CBC WITH DIFFERENTIAL/PLATELET
Abs Immature Granulocytes: 0.02 10*3/uL (ref 0.00–0.07)
Basophils Absolute: 0.1 10*3/uL (ref 0.0–0.1)
Basophils Relative: 1 %
Eosinophils Absolute: 0.1 10*3/uL (ref 0.0–0.5)
Eosinophils Relative: 2 %
HCT: 41.5 % (ref 39.0–52.0)
Hemoglobin: 14.2 g/dL (ref 13.0–17.0)
Immature Granulocytes: 0 %
Lymphocytes Relative: 35 %
Lymphs Abs: 2.3 10*3/uL (ref 0.7–4.0)
MCH: 29.6 pg (ref 26.0–34.0)
MCHC: 34.2 g/dL (ref 30.0–36.0)
MCV: 86.6 fL (ref 80.0–100.0)
Monocytes Absolute: 0.4 10*3/uL (ref 0.1–1.0)
Monocytes Relative: 5 %
Neutro Abs: 3.9 10*3/uL (ref 1.7–7.7)
Neutrophils Relative %: 57 %
Platelets: 316 10*3/uL (ref 150–400)
RBC: 4.79 MIL/uL (ref 4.22–5.81)
RDW: 13.2 % (ref 11.5–15.5)
WBC: 6.7 10*3/uL (ref 4.0–10.5)
nRBC: 0 % (ref 0.0–0.2)

## 2020-05-07 LAB — CBG MONITORING, ED: Glucose-Capillary: 113 mg/dL — ABNORMAL HIGH (ref 70–99)

## 2020-05-07 LAB — D-DIMER, QUANTITATIVE: D-Dimer, Quant: 0.49 ug/mL-FEU (ref 0.00–0.50)

## 2020-05-07 LAB — TROPONIN I (HIGH SENSITIVITY)
Troponin I (High Sensitivity): 3 ng/L (ref <18)
Troponin I (High Sensitivity): 3 ng/L (ref <18)

## 2020-05-07 MED ORDER — ACETAMINOPHEN 500 MG PO TABS
1000.0000 mg | ORAL_TABLET | Freq: Once | ORAL | Status: AC
  Administered 2020-05-07: 1000 mg via ORAL
  Filled 2020-05-07: qty 2

## 2020-05-07 MED ORDER — SODIUM CHLORIDE 0.9 % IV BOLUS
1000.0000 mL | Freq: Once | INTRAVENOUS | Status: AC
  Administered 2020-05-07: 1000 mL via INTRAVENOUS

## 2020-05-07 MED ORDER — IOHEXOL 350 MG/ML SOLN
59.0000 mL | Freq: Once | INTRAVENOUS | Status: AC | PRN
  Administered 2020-05-07: 59 mL via INTRAVENOUS

## 2020-05-07 NOTE — ED Provider Notes (Signed)
[facility] EMERGENCY DEPARTMENT Provider Note   CSN: [id number] Arrival date & time: 05/07/20  0016     History Chief Complaint  Patient presents with  . Chest Pain    Mathew Moore is a 55 y.o. male.  Patient is a 55 year old male with past medical history of hypertension, sarcoidosis, and nonocclusive coronary artery disease by cath in July 2020.  Patient brought from work for evaluation of chest pain/syncope.  Patient tells me he worked in the yard yesterday, then began to feel very weak and disoriented.  As the evening went on, he began to feel better, then woke this morning feeling fine.  He went to work this evening, then began having lightheadedness.  He then had a witnessed syncopal episode during which he fell backward and struck the back of his head.  When he woke, he describes crushing pain to the center of his chest radiating to the left shoulder.  He also feels short of breath.  He was transported here by EMS for evaluation of this episode.  He describes ongoing heaviness on his chest as if there is something sitting on him.  The history is provided by the patient.  Chest Pain Pain location:  Substernal area Pain quality comment:  Heaviness Pain radiates to:  L shoulder Pain severity:  Moderate Onset quality:  Sudden Duration:  1 hour Timing:  Constant Progression:  Unchanged Chronicity:  New Relieved by:  Nothing Worsened by:  Nothing Ineffective treatments:  None tried      Past Medical History:  Diagnosis Date  . Anxiety    ocassional  . Atypical mole 08/24/2019   mild- right lower leg - anterior  . GERD (gastroesophageal reflux disease)   . Hypertension   . Sarcoidosis 07/2019    Patient Active Problem List   Diagnosis Date Noted  . Sarcoidosis 08/10/2019  . Lymphadenopathy, mediastinal 06/11/2019  . Abnormal stress test   . Chest pain     Past Surgical History:  Procedure Laterality Date  . BRONCHOSCOPY    . CLEFT LIP  REPAIR    . COLONOSCOPY    . HERNIA REPAIR Left   . LEFT HEART CATH AND CORONARY ANGIOGRAPHY N/A 08/23/2018   Procedure: LEFT HEART CATH AND CORONARY ANGIOGRAPHY;  Surgeon: Burnell Blanks, MD;  Location: [facility];  Service: Cardiovascular;  Laterality: N/A;  . MEDIASTINOSCOPY N/A 07/20/2019   Procedure: MEDIASTINOSCOPY;  Surgeon: Melrose Nakayama, MD;  Location: [facility];  Service: Thoracic;  Laterality: N/A;  . NASAL SEPTUM SURGERY    . PLANTAR FASCIA SURGERY Right   . VIDEO BRONCHOSCOPY WITH ENDOBRONCHIAL ULTRASOUND N/A 07/06/2019   Procedure: VIDEO BRONCHOSCOPY WITH ENDOBRONCHIAL ULTRASOUND;  Surgeon: Marshell Garfinkel, MD;  Location: [facility];  Service: Pulmonary;  Laterality: N/A;       Family History  Problem Relation Age of Onset  . Breast cancer Mother   . Heart disease Father   . Lung cancer Father     Social History   Tobacco Use  . Smoking status: Never Smoker  . Smokeless tobacco: Never Used  Vaping Use  . Vaping Use: Never used  Substance Use Topics  . Alcohol use: Not Currently  . Drug use: Not Currently    Home Medications Prior to Admission medications   Medication Sig Start Date End Date Taking? Authorizing Provider  acetaminophen (TYLENOL) 500 MG tablet Take 1,000 mg by mouth every 6 (six) hours as needed for moderate pain or headache.  [provider]  albuterol (VENTOLIN HFA) 108 (90 Base) MCG/ACT inhaler Inhale 1-2 puffs into the lungs every 6 (six) hours as needed for wheezing or shortness of breath. 09/11/19   Martyn Ehrich, NP  alum & mag hydroxide-simeth (MAALOX/MYLANTA) 200-200-20 MG/5ML suspension Take 15 mLs by mouth as needed for indigestion or heartburn.    [provider]  ANDRODERM 2 MG/24HR PT24 Apply topically. 01/04/20   [provider]  citalopram (CELEXA) 20 MG tablet Take 20 mg by mouth daily.    [provider]  clonazePAM (KLONOPIN) 0.5 MG tablet Take 0.5 mg by mouth 3 (three) times  daily as needed for anxiety.    [provider]  indapamide (LOZOL) 1.25 MG tablet Take 1.25 mg by mouth daily. 04/09/20   [provider]  losartan (COZAAR) 25 MG tablet Take 25 mg by mouth daily.    [provider]  omeprazole (PRILOSEC) 40 MG capsule Take 1 capsule (40 mg total) by mouth in the morning and at bedtime. 11/20/19   Spero Geralds, MD  zolpidem (AMBIEN) 10 MG tablet Take 10 mg by mouth at bedtime.  05/01/19   [provider]    Allergies    Patient has no known allergies.  Review of Systems   Review of Systems  Cardiovascular: Positive for chest pain.  All other systems reviewed and are negative.   Physical Exam Updated Vital Signs BP 118/83   Pulse 87   Temp 98 F (36.7 C)   Resp 10   SpO2 94%   Physical Exam Vitals and nursing note reviewed.  Constitutional:      General: He is not in acute distress.    Appearance: He is well-developed. He is not diaphoretic.  HENT:     Head: Normocephalic and atraumatic.  Cardiovascular:     Rate and Rhythm: Normal rate and regular rhythm.     Heart sounds: No murmur heard. No friction rub.  Pulmonary:     Effort: Pulmonary effort is normal. No respiratory distress.     Breath sounds: Normal breath sounds. No wheezing or rales.  Abdominal:     General: Bowel sounds are normal. There is no distension.     Palpations: Abdomen is soft.     Tenderness: There is no abdominal tenderness.  Musculoskeletal:        General: Normal range of motion.     Cervical back: Normal range of motion and neck supple.     Right lower leg: No tenderness. No edema.     Left lower leg: No tenderness. No edema.  Skin:    General: Skin is warm and dry.  Neurological:     General: No focal deficit present.     Mental Status: He is alert and oriented to person, place, and time.     Cranial Nerves: No cranial nerve deficit.     Motor: No weakness.     Coordination: Coordination normal.     ED Results /  Procedures / Treatments   Labs (all labs ordered are listed, but only abnormal results are displayed) Labs Reviewed  CBG MONITORING, ED - Abnormal; Notable for the following components:      Result Value   Glucose-Capillary 113 (*)    All other components within normal limits    EKG EKG Interpretation  Date/Time:  Wednesday May 07 2020 00:21:39 EDT Ventricular Rate:  98 PR Interval:  150 QRS Duration: 79 QT Interval:  349 QTC Calculation: 446 R  Axis:   -40 Text Interpretation: Sinus rhythm Left axis deviation Low voltage, precordial leads RSR' in V1 or V2, right VCD or RVH No significant change since 07/20/2019 Confirmed by Veryl Speak [phone]) on 05/07/2020 12:51:48 AM   Radiology No results found.  Procedures Procedures   Medications Ordered in ED Medications  sodium chloride 0.9 % bolus 1,000 mL (has no administration in time range)  acetaminophen (TYLENOL) tablet 1,000 mg (has no administration in time range)    ED Course  I have reviewed the triage vital signs and the nursing notes.  Pertinent labs & imaging results that were available during my care of the patient were reviewed by me and considered in my medical decision making (see chart for details).    MDM Rules/Calculators/A&P  Patient presenting here after a syncopal episode followed by a chest pressure.  This episode occurred while at work.  Patient arrives here alert, oriented, and neurologically intact with stable vital signs and no fever.  Initial EKG is unremarkable and troponin x2 is negative.  CTA of the chest shows no evidence for pulmonary embolism or dissection and head CT is unremarkable for either a cause of his syncope or trauma resulting from syncope.  Patient has been observed for several hours and vitals are stable.  He has had no ectopy on the cardiac monitor.  At this point, discharge seems appropriate.  I will have him follow-up with his cardiologist and return as needed if symptoms  worsen.  Final Clinical Impression(s) / ED Diagnoses Final diagnoses:  None    Rx / DC Orders ED Discharge Orders    None       Veryl Speak, MD 05/07/20 [phone]

## 2020-05-07 NOTE — Discharge Instructions (Addendum)
Follow-up with your cardiologist in the next week, and return to the emergency department if you develop any new and/or concerning symptoms.

## 2020-05-07 NOTE — ED Notes (Signed)
Patient transported to CT 

## 2020-05-07 NOTE — ED Triage Notes (Addendum)
Pt bibems from work with sudden onset of chest discomfort/Nausea.  Pt had a witnessed syncopal episode while at work and when he woke up had crushing chest pain that radiate down his left arm. Pt thinks he might have hit the back of his head. Denies SOB. Pt alert and oriented x4. Pt got 2 sublingual nitro with no relief and 324 of aspirin from EMS. Pt then given 8mg  of morphine with minor relief.

## 2020-05-09 ENCOUNTER — Ambulatory Visit (INDEPENDENT_AMBULATORY_CARE_PROVIDER_SITE_OTHER): Payer: 59

## 2020-05-09 ENCOUNTER — Encounter: Payer: Self-pay | Admitting: Student

## 2020-05-09 ENCOUNTER — Other Ambulatory Visit: Payer: Self-pay | Admitting: Student

## 2020-05-09 ENCOUNTER — Other Ambulatory Visit: Payer: Self-pay | Admitting: Family Medicine

## 2020-05-09 ENCOUNTER — Other Ambulatory Visit: Payer: Self-pay

## 2020-05-09 ENCOUNTER — Ambulatory Visit: Payer: 59 | Admitting: Student

## 2020-05-09 VITALS — BP 104/60 | HR 76 | Ht 67.0 in | Wt 209.0 lb

## 2020-05-09 DIAGNOSIS — G459 Transient cerebral ischemic attack, unspecified: Secondary | ICD-10-CM

## 2020-05-09 DIAGNOSIS — R55 Syncope and collapse: Secondary | ICD-10-CM

## 2020-05-09 DIAGNOSIS — I10 Essential (primary) hypertension: Secondary | ICD-10-CM | POA: Diagnosis not present

## 2020-05-09 DIAGNOSIS — R079 Chest pain, unspecified: Secondary | ICD-10-CM

## 2020-05-09 DIAGNOSIS — R002 Palpitations: Secondary | ICD-10-CM

## 2020-05-09 NOTE — Progress Notes (Signed)
Cardiology Office Note    Date:  05/10/2020   ID:  Mathew Moore, DOB December 30, 1965, MRN [id number]  PCP:  Manon Hilding, MD  Cardiologist: Jenkins Rouge, MD    Chief Complaint  Patient presents with  . Follow-up    Recent Emergency Dept visit    History of Present Illness:    Mathew Moore is a 55 y.o. 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  He was last examined by Dr. Johnsie Cancel in 01/2020 and reported still having some dyspnea and a cough but denied any chest pain or palpitations. Given concern for sarcoidosis which was being followed by Pulmonology, a cardiac MRI was recommended to rule out myocardial involvement. This was performed later that month and was normal with no evidence of sarcoidosis and LV size and EF were normal.   In the interim, he presented to [facility] ED on 05/07/2020 for evaluation of sudden nausea and chest discomfort while at work with associated syncope. He did report falling backwards and struck his head. Labs showed WBC 6.7, Hgb 14.2, platelets 316, Na+ 139, K+ 3.5 and creatinine 1.23. Initial and delta HS troponin values negative at 3 and D-dimer negative. CT Head showed no acute intracranial abnormalities. CTA chest with no evidence of a PE. EKG showed NSR, HR 98 with LAD and no acute ST abnormalities. He was monitored on telemetry and maintained NSR and was encouraged to follow-up with Cardiology as an outpatient.   In talking with the patient today, he denies any recurrent syncope episodes since his ED visit but reports fatigue and has also experienced memory fog. Was just evaluated by his PCP for this and carotid dopplers were ordered along with him being referred to [facility]. He denies any recurrent chest pain but has experienced palpitations. No orthopnea, PND or edema. He does report  dizziness at times and BP is soft at 104/60 during today's visit.    Past Medical History:  Diagnosis Date  . Anxiety    ocassional  . Atypical mole 08/24/2019   mild- right lower leg - anterior  . GERD (gastroesophageal reflux disease)   . Hypertension   . Sarcoidosis 07/2019    Past Surgical History:  Procedure Laterality Date  . BRONCHOSCOPY    . CLEFT LIP REPAIR    . COLONOSCOPY    . HERNIA REPAIR Left   . LEFT HEART CATH AND CORONARY ANGIOGRAPHY N/A 08/23/2018   Procedure: LEFT HEART CATH AND CORONARY ANGIOGRAPHY;  Surgeon: Burnell Blanks, MD;  Location: [place] [facility];  Service: Cardiovascular;  Laterality: N/A;  . MEDIASTINOSCOPY N/A 07/20/2019   Procedure: MEDIASTINOSCOPY;  Surgeon: Melrose Nakayama, MD;  Location: [place];  Service: Thoracic;  Laterality: N/A;  . NASAL SEPTUM SURGERY    . PLANTAR FASCIA SURGERY Right   . VIDEO BRONCHOSCOPY WITH ENDOBRONCHIAL ULTRASOUND N/A 07/06/2019   Procedure: VIDEO BRONCHOSCOPY WITH ENDOBRONCHIAL ULTRASOUND;  Surgeon: Marshell Garfinkel, MD;  Location: [place];  Service: Pulmonary;  Laterality: N/A;    Current Medications: Outpatient Medications Prior to Visit  Medication Sig Dispense Refill  . acetaminophen (TYLENOL) 500 MG tablet Take 1,000 mg by mouth every 6 (six) hours as needed for moderate pain or headache.     . albuterol (VENTOLIN HFA) 108 (90 Base) MCG/ACT inhaler Inhale 1-2 puffs into the lungs every 6 (six)  hours as needed for wheezing or shortness of breath. 18 g 1  . aspirin EC 81 MG tablet Take 81 mg by mouth daily. Swallow whole.    Marland Kitchen atorvastatin (LIPITOR) 40 MG tablet Take 40 mg by mouth daily.    . Cholecalciferol (VITAMIN D) 50 MCG (2000 UT) CAPS Take by mouth.    . citalopram (CELEXA) 20 MG tablet Take 20 mg by mouth daily.    . clonazePAM (KLONOPIN) 0.5 MG tablet Take 0.5 mg by mouth 3 (three) times daily as needed for anxiety.    . indapamide (LOZOL) 1.25 MG tablet Take 1.25 mg by mouth daily.    Marland Kitchen  losartan (COZAAR) 25 MG tablet Take 25 mg by mouth daily.    Marland Kitchen omeprazole (PRILOSEC) 40 MG capsule Take 1 capsule (40 mg total) by mouth in the morning and at bedtime. 60 capsule 11  . testosterone cypionate (DEPOTESTOSTERONE CYPIONATE) 200 MG/ML injection Inject 200 mg into the muscle every 30 (thirty) days.    Marland Kitchen zolpidem (AMBIEN) 10 MG tablet Take 10 mg by mouth at bedtime.     Marland Kitchen alum & mag hydroxide-simeth (MAALOX/MYLANTA) 200-200-20 MG/5ML suspension Take 15 mLs by mouth as needed for indigestion or heartburn.    Renae Gloss 2 MG/24HR PT24 Apply topically.     Facility-Administered Medications Prior to Visit  Medication Dose Route Frequency Provider Last Rate Last Admin  . sodium chloride flush (NS) 0.9 % injection 3 mL  3 mL Intravenous Q12H Josue Hector, MD         Allergies:   Patient has no known allergies.   Social History   Socioeconomic History  . Marital status: Married    Spouse name: [name]  . Number of children: 2  . Years of education: Not on file  . Highest education level: Not on file  Occupational History  . Not on file  Tobacco Use  . Smoking status: Never Smoker  . Smokeless tobacco: Never Used  Vaping Use  . Vaping Use: Never used  Substance and Sexual Activity  . Alcohol use: Not Currently  . Drug use: Not Currently  . Sexual activity: Not on file  Other Topics Concern  . Not on file  Social History Narrative  . Not on file   Social Determinants of Health   Financial Resource Strain: Low Risk   . Difficulty of Paying Living Expenses: Not hard at all  Food Insecurity: No Food Insecurity  . Worried About Charity fundraiser in the Last Year: Never true  . Ran Out of Food in the Last Year: Never true  Transportation Needs: No Transportation Needs  . Lack of Transportation (Medical): No  . Lack of Transportation (Non-Medical): No  Physical Activity: Sufficiently Active  . Days of Exercise per Week: 4 days  . Minutes of Exercise per Session: 40  min  Stress: No Stress Concern Present  . Feeling of Stress : Not at all  Social Connections: Moderately Integrated  . Frequency of Communication with Friends and Family: Three times a week  . Frequency of Social Gatherings with Friends and Family: Three times a week  . Attends Religious Services: More than 4 times per year  . Active Member of Clubs or Organizations: No  . Attends Archivist Meetings: Never  . Marital Status: Married     Family History:  The patient's family history includes Breast cancer in his mother; Heart disease in his father; Lung cancer in his father.  Review of Systems:   Please see the history of present illness.     General:  No chills, fever, night sweats or weight changes. Positive for syncopal episode.  Cardiovascular:  No dyspnea on exertion, edema, orthopnea, paroxysmal nocturnal dyspnea. Positive for chest pain and palpitations.  Dermatological: No rash, lesions/masses Respiratory: No cough, dyspnea Urologic: No hematuria, dysuria Abdominal:   No nausea, vomiting, diarrhea, bright red blood per rectum, melena, or hematemesis Neurologic:  No visual changes, wkns. Positive for memory issues.   All other systems reviewed and are otherwise negative except as noted above.   Physical Exam:    VS:  BP 104/60   Pulse 76   Ht 5\' 7"  (1.702 m)   Wt 209 lb (94.8 kg)   SpO2 97%   BMI 32.73 kg/m    General: Well developed, well nourished,male appearing in no acute distress. Head: Normocephalic, atraumatic. Neck: No carotid bruits. JVD not elevated.  Lungs: Respirations regular and unlabored, without wheezes or rales.  Heart: Regular rate and rhythm. No S3 or S4.  No murmur, no rubs, or gallops appreciated. Abdomen: Appears non-distended. No obvious abdominal masses. Msk:  Strength and tone appear normal for age. No obvious joint deformities or effusions. Extremities: No clubbing or cyanosis. No lower extremity edema.  Distal pedal pulses are  2+ bilaterally. Neuro: Alert and oriented X 3. Moves all extremities spontaneously. No focal deficits noted. Psych:  Responds to questions appropriately with a normal affect. Skin: No rashes or lesions noted  Wt Readings from Last 3 Encounters:  05/09/20 209 lb (94.8 kg)  04/15/20 212 lb 12.8 oz (96.5 kg)  01/21/20 222 lb (100.7 kg)     Studies/Labs Reviewed:   EKG:  EKG is not ordered today. EKG from 05/07/2020 is reviewed which shows NSR, HR 98 with LAD and no acute ST abnormalities.  Recent Labs: 05/07/2020: ALT 26; BUN 23; Creatinine, Ser 1.23; Hemoglobin 14.2; Platelets 316; Potassium 3.5; Sodium 139   Lipid Panel No results found for: CHOL, TRIG, HDL, CHOLHDL, VLDL, LDLCALC, LDLDIRECT  Additional studies/ records that were reviewed today include:   Cardiac Catheterization: 08/2018  Mid RCA lesion is 20% stenosed.  Mid Cx lesion is 10% stenosed.  Mid LAD lesion is 30% stenosed.   1. Mild non-obstructive CAD 2. Normal filling pressures  Recommendations: Medical management of mild CAD   Cardiac MRI: 01/2020 IMPRESSION: 1. Normal cardiac MRI  2.  No evidence of sarcoid  3.  Normal LV size and function EF 63%   Assessment:    1. Syncope and collapse   2. Chest pain, unspecified type   3. Essential hypertension      Plan:   In order of problems listed above:  1. Syncope - Unclear etiology but he reports associated chest pain and palpitations at that time. Workup during Emergency Department evaluation was unrevealing as outlined above. Given his episode and associated palpitations, will plan to place a 2-week Zio monitor to rule-out any cardiac arrhythmias. Reviewed [place] DOT guidelines. I encouraged him to keep scheduled follow-up with Neurology given his persistent memory fog since the episode.   2. Chest Pain - Occurred while at rest and persisted until arrival in the ED and did not respond to SL NTG. HS Troponin values were negative and EKG was without  acute ST changes. Given his reassuring work-up and minimal CAD by cath in 08/2018, would not plan for repeat ischemic evaluation at this time unless he has recurrent symptoms.  - Continue ASA  81mg  daily and Atorvastatin 40mg  daily.   3. HTN - His BP is soft at 104/60 during today's visit and he reports he was recently started on Lozol 1.25mg  daily by his PCP. I recommended he cut this in half for now given his soft readings and dizziness. Continue Losartan at current dosing.     Medication Adjustments/Labs and Tests Ordered: Current medicines are reviewed at length with the patient today.  Concerns regarding medicines are outlined above.  Medication changes, Labs and Tests ordered today are listed in the Patient Instructions below. Patient Instructions  Medication Instructions:  Your physician recommends that you continue on your current medications as directed. Please refer to the Current Medication list given to you today.  Decrease Lozol to 1/2 tablet Daily   *If you need a refill on your cardiac medications before your next appointment, please call your pharmacy*   Lab Work: NONE   If you have labs (blood work) drawn today and your tests are completely normal, you will receive your results only by: Marland Kitchen MyChart Message (if you have MyChart) OR . A paper copy in the mail If you have any lab test that is abnormal or we need to change your treatment, we will call you to review the results.   Testing/Procedures: Bryn Gulling- Long Term Monitor Instructions   Your physician has requested you wear your ZIO patch monitor___14____days.   This is a single patch monitor.  [employer] supplies one patch monitor per enrollment.  Additional stickers are not available.   Please do not apply patch if you will be having a Nuclear Stress Test, Echocardiogram, Cardiac CT, MRI, or Chest Xray during the time frame you would be wearing the monitor. The patch cannot be worn during these tests.  You cannot remove  and re-apply the ZIO XT patch monitor.   Your ZIO patch monitor will be sent USPS Priority mail from [facility] directly to your home address. The monitor may also be mailed to a PO BOX if home delivery is not available.   It may take 3-5 days to receive your monitor after you have been enrolled.   Once you have received you monitor, please review enclosed instructions.  Your monitor has already been registered assigning a specific monitor serial # to you.   Applying the monitor   Shave hair from upper left chest.   Hold abrader disc by orange tab.  Rub abrader in 40 strokes over left upper chest as indicated in your monitor instructions.   Clean area with 4 enclosed alcohol pads .  Use all pads to assure are is cleaned thoroughly.  Let dry.   Apply patch as indicated in monitor instructions.  Patch will be place under collarbone on left side of chest with arrow pointing upward.   Rub patch adhesive wings for 2 minutes.Remove white label marked "1".  Remove white label marked "2".  Rub patch adhesive wings for 2 additional minutes.   While looking in a mirror, press and release button in center of patch.  A small green light will flash 3-4 times .  This will be your only indicator the monitor has been turned on.     Do not shower for the first 24 hours.  You may shower after the first 24 hours.   Press button if you feel a symptom. You will hear a small click.  Record Date, Time and Symptom in the Patient Log Book.   When you are ready to remove patch, follow  instructions on last 2 pages of Patient Log Book.  Stick patch monitor onto last page of Patient Log Book.   Place Patient Log Book in [place] box.  Use locking tab on box and tape box closed securely.  The Orange and [employer] has [employer] on it.  Please place in mailbox as soon as possible.  Your physician should have your test results approximately 7 days after the monitor has been mailed back to [facility].   Call  [place] at [phone] if you have questions regarding your ZIO XT patch monitor.  Call them immediately if you see an orange light blinking on your monitor.   If your monitor falls off in less than 4 days contact our Monitor department at [phone].  If your monitor becomes loose or falls off after 4 days call [employer] at [phone] for suggestions on securing your monitor.   Follow-Up: At [facility], you and your health needs are our priority.  As part of our continuing mission to provide you with exceptional heart care, we have created designated Provider Care Teams.  These Care Teams include your primary Cardiologist (physician) and Advanced Practice Providers (APPs -  Physician Assistants and Nurse Practitioners) who all work together to provide you with the care you need, when you need it.  We recommend signing up for the patient portal called "MyChart".  Sign up information is provided on this After Visit Summary.  MyChart is used to connect with patients for Virtual Visits (Telemedicine).  Patients are able to view lab/test results, encounter notes, upcoming appointments, etc.  Non-urgent messages can be sent to your provider as well.   To learn more about what you can do with MyChart, go to [email or web address].    Your next appointment:   2 month(s)  The format for your next appointment:   In Person  Provider:   Jenkins Rouge, MD   Other Instructions Thank you for choosing [place]!       Signed, Erma Heritage, PA-C  05/10/2020 12:25 PM    [facility] [address] [place], [place] [address] Phone: [phone] Fax: [phone]

## 2020-05-09 NOTE — Patient Instructions (Signed)
Medication Instructions:  Your physician recommends that you continue on your current medications as directed. Please refer to the Current Medication list given to you today.  Decrease Lozol to 1/2 tablet Daily   *If you need a refill on your cardiac medications before your next appointment, please call your pharmacy*   Lab Work: NONE   If you have labs (blood work) drawn today and your tests are completely normal, you will receive your results only by: Marland Kitchen MyChart Message (if you have MyChart) OR . A paper copy in the mail If you have any lab test that is abnormal or we need to change your treatment, we will call you to review the results.   Testing/Procedures: Bryn Gulling- Long Term Monitor Instructions   Your physician has requested you wear your ZIO patch monitor___14____days.   This is a single patch monitor.  [employer] supplies one patch monitor per enrollment.  Additional stickers are not available.   Please do not apply patch if you will be having a Nuclear Stress Test, Echocardiogram, Cardiac CT, MRI, or Chest Xray during the time frame you would be wearing the monitor. The patch cannot be worn during these tests.  You cannot remove and re-apply the ZIO XT patch monitor.   Your ZIO patch monitor will be sent USPS Priority mail from [facility] directly to your home address. The monitor may also be mailed to a PO BOX if home delivery is not available.   It may take 3-5 days to receive your monitor after you have been enrolled.   Once you have received you monitor, please review enclosed instructions.  Your monitor has already been registered assigning a specific monitor serial # to you.   Applying the monitor   Shave hair from upper left chest.   Hold abrader disc by orange tab.  Rub abrader in 40 strokes over left upper chest as indicated in your monitor instructions.   Clean area with 4 enclosed alcohol pads .  Use all pads to assure are is cleaned thoroughly.  Let dry.    Apply patch as indicated in monitor instructions.  Patch will be place under collarbone on left side of chest with arrow pointing upward.   Rub patch adhesive wings for 2 minutes.Remove white label marked "1".  Remove white label marked "2".  Rub patch adhesive wings for 2 additional minutes.   While looking in a mirror, press and release button in center of patch.  A small green light will flash 3-4 times .  This will be your only indicator the monitor has been turned on.     Do not shower for the first 24 hours.  You may shower after the first 24 hours.   Press button if you feel a symptom. You will hear a small click.  Record Date, Time and Symptom in the Patient Log Book.   When you are ready to remove patch, follow instructions on last 2 pages of Patient Log Book.  Stick patch monitor onto last page of Patient Log Book.   Place Patient Log Book in [place] box.  Use locking tab on box and tape box closed securely.  The Orange and [employer] has [employer] on it.  Please place in mailbox as soon as possible.  Your physician should have your test results approximately 7 days after the monitor has been mailed back to [facility].   Call [place] at [phone] if you have questions regarding your ZIO XT patch  monitor.  Call them immediately if you see an orange light blinking on your monitor.   If your monitor falls off in less than 4 days contact our Monitor department at [phone].  If your monitor becomes loose or falls off after 4 days call [employer] at [phone] for suggestions on securing your monitor.   Follow-Up: At [facility], you and your health needs are our priority.  As part of our continuing mission to provide you with exceptional heart care, we have created designated Provider Care Teams.  These Care Teams include your primary Cardiologist (physician) and Advanced Practice Providers (APPs -  Physician Assistants and Nurse Practitioners) who  all work together to provide you with the care you need, when you need it.  We recommend signing up for the patient portal called "MyChart".  Sign up information is provided on this After Visit Summary.  MyChart is used to connect with patients for Virtual Visits (Telemedicine).  Patients are able to view lab/test results, encounter notes, upcoming appointments, etc.  Non-urgent messages can be sent to your provider as well.   To learn more about what you can do with MyChart, go to [email or web address].    Your next appointment:   2 month(s)  The format for your next appointment:   In Person  Provider:   Jenkins Rouge, MD   Other Instructions Thank you for choosing [facility]!

## 2020-05-10 ENCOUNTER — Encounter: Payer: Self-pay | Admitting: Student

## 2020-05-13 ENCOUNTER — Other Ambulatory Visit: Payer: Self-pay

## 2020-05-13 ENCOUNTER — Encounter (HOSPITAL_COMMUNITY): Payer: Self-pay | Admitting: Emergency Medicine

## 2020-05-13 ENCOUNTER — Observation Stay (HOSPITAL_COMMUNITY)
Admission: EM | Admit: 2020-05-13 | Discharge: 2020-05-15 | Disposition: A | Discharge status: Home / Self Care | Payer: 59 | Attending: Family Medicine | Admitting: Family Medicine

## 2020-05-13 DIAGNOSIS — S069XAA Unspecified intracranial injury with loss of consciousness status unknown, initial encounter: Secondary | ICD-10-CM | POA: Diagnosis present

## 2020-05-13 DIAGNOSIS — R55 Syncope and collapse: Principal | ICD-10-CM | POA: Diagnosis present

## 2020-05-13 DIAGNOSIS — D869 Sarcoidosis, unspecified: Secondary | ICD-10-CM | POA: Diagnosis present

## 2020-05-13 DIAGNOSIS — F419 Anxiety disorder, unspecified: Secondary | ICD-10-CM | POA: Diagnosis present

## 2020-05-13 DIAGNOSIS — E876 Hypokalemia: Secondary | ICD-10-CM | POA: Diagnosis not present

## 2020-05-13 DIAGNOSIS — W19XXXA Unspecified fall, initial encounter: Secondary | ICD-10-CM | POA: Insufficient documentation

## 2020-05-13 DIAGNOSIS — Z79899 Other long term (current) drug therapy: Secondary | ICD-10-CM | POA: Diagnosis not present

## 2020-05-13 DIAGNOSIS — Z7982 Long term (current) use of aspirin: Secondary | ICD-10-CM | POA: Insufficient documentation

## 2020-05-13 DIAGNOSIS — Z20822 Contact with and (suspected) exposure to covid-19: Secondary | ICD-10-CM | POA: Diagnosis not present

## 2020-05-13 DIAGNOSIS — I2510 Atherosclerotic heart disease of native coronary artery without angina pectoris: Secondary | ICD-10-CM | POA: Diagnosis present

## 2020-05-13 DIAGNOSIS — R569 Unspecified convulsions: Secondary | ICD-10-CM | POA: Diagnosis not present

## 2020-05-13 DIAGNOSIS — S06300A Unspecified focal traumatic brain injury without loss of consciousness, initial encounter: Secondary | ICD-10-CM | POA: Insufficient documentation

## 2020-05-13 DIAGNOSIS — S069X9A Unspecified intracranial injury with loss of consciousness of unspecified duration, initial encounter: Secondary | ICD-10-CM | POA: Diagnosis present

## 2020-05-13 LAB — CBG MONITORING, ED: Glucose-Capillary: 99 mg/dL (ref 70–99)

## 2020-05-13 NOTE — ED Triage Notes (Signed)
Pt brought to ED by [facility] from home after having a syncope episode, pt hit his head when falling. No on blood thinners per EMS pt was having a short period of seizure when they arrive to his house. Pt is AO x 4 on arrival. CBG 109 for EMS. C/o HA and dizziness now.

## 2020-05-13 NOTE — ED Provider Notes (Signed)
[facility] EMERGENCY DEPARTMENT Provider Note   CSN: [id number] Arrival date & time: 05/13/20  2311     History Chief Complaint  Patient presents with  . Loss of Consciousness    Mathew Moore is a 55 y.o. male.  Patient is a 55 year old male with history of hypertension, GERD, sarcoidosis, and anxiety.  Patient presents by EMS for evaluation of syncope/possible seizure activity.  Patient seen by myself 1 week ago after a similar episode that occurred at work.  Patient's work-up here was unremarkable, then he was followed up by cardiology 2 days later.  He was placed on a ZIO monitor.    Patient reports feeling palpitations intermittently throughout the day.  After having a bonfire with the neighbors, patient returned into the house, then had some sort of episode where he lost consciousness, then was witnessed to have tonic-clonic like activity that the family believes was seizure activity.  Patient has no recollection of this incident.  Patient denies other symptoms recently with the exception of feeling like he is "in a fog" with his thinking since his last visit.  The history is provided by the patient.  Loss of Consciousness Episode history:  Single Most recent episode:  Today Timing:  Constant Progression:  Resolved Chronicity:  New Relieved by:  Nothing Worsened by:  Nothing Ineffective treatments:  None tried      Past Medical History:  Diagnosis Date  . Anxiety    ocassional  . Atypical mole 08/24/2019   mild- right lower leg - anterior  . GERD (gastroesophageal reflux disease)   . Hypertension   . Sarcoidosis 07/2019    Patient Active Problem List   Diagnosis Date Noted  . Sarcoidosis 08/10/2019  . Lymphadenopathy, mediastinal 06/11/2019  . Abnormal stress test   . Chest pain     Past Surgical History:  Procedure Laterality Date  . BRONCHOSCOPY    . CLEFT LIP REPAIR    . COLONOSCOPY    . HERNIA REPAIR Left   . LEFT HEART CATH  AND CORONARY ANGIOGRAPHY N/A 08/23/2018   Procedure: LEFT HEART CATH AND CORONARY ANGIOGRAPHY;  Surgeon: Burnell Blanks, MD;  Location: [facility];  Service: Cardiovascular;  Laterality: N/A;  . MEDIASTINOSCOPY N/A 07/20/2019   Procedure: MEDIASTINOSCOPY;  Surgeon: Melrose Nakayama, MD;  Location: [facility];  Service: Thoracic;  Laterality: N/A;  . NASAL SEPTUM SURGERY    . PLANTAR FASCIA SURGERY Right   . VIDEO BRONCHOSCOPY WITH ENDOBRONCHIAL ULTRASOUND N/A 07/06/2019   Procedure: VIDEO BRONCHOSCOPY WITH ENDOBRONCHIAL ULTRASOUND;  Surgeon: Marshell Garfinkel, MD;  Location: [facility];  Service: Pulmonary;  Laterality: N/A;       Family History  Problem Relation Age of Onset  . Breast cancer Mother   . Heart disease Father   . Lung cancer Father     Social History   Tobacco Use  . Smoking status: Never Smoker  . Smokeless tobacco: Never Used  Vaping Use  . Vaping Use: Never used  Substance Use Topics  . Alcohol use: Not Currently  . Drug use: Not Currently    Home Medications Prior to Admission medications   Medication Sig Start Date End Date Taking? Authorizing Provider  acetaminophen (TYLENOL) 500 MG tablet Take 1,000 mg by mouth every 6 (six) hours as needed for moderate pain or headache.     [provider]  albuterol (VENTOLIN HFA) 108 (90 Base) MCG/ACT inhaler Inhale 1-2 puffs into the lungs every 6 (six)  hours as needed for wheezing or shortness of breath. 09/11/19   Martyn Ehrich, NP  aspirin EC 81 MG tablet Take 81 mg by mouth daily. Swallow whole.    [provider]  atorvastatin (LIPITOR) 40 MG tablet Take 40 mg by mouth daily.    [provider]  Cholecalciferol (VITAMIN D) 50 MCG (2000 UT) CAPS Take by mouth.    [provider]  citalopram (CELEXA) 20 MG tablet Take 20 mg by mouth daily.    [provider]  clonazePAM (KLONOPIN) 0.5 MG tablet Take 0.5 mg by mouth 3 (three) times daily as needed for anxiety.     [provider]  indapamide (LOZOL) 1.25 MG tablet Take 1.25 mg by mouth daily. 04/09/20   [provider]  losartan (COZAAR) 25 MG tablet Take 25 mg by mouth daily.    [provider]  omeprazole (PRILOSEC) 40 MG capsule Take 1 capsule (40 mg total) by mouth in the morning and at bedtime. 11/20/19   Spero Geralds, MD  testosterone cypionate (DEPOTESTOSTERONE CYPIONATE) 200 MG/ML injection Inject 200 mg into the muscle every 30 (thirty) days. 05/01/20   [provider]  zolpidem (AMBIEN) 10 MG tablet Take 10 mg by mouth at bedtime.  05/01/19   [provider]    Allergies    Patient has no known allergies.  Review of Systems   Review of Systems  Cardiovascular: Positive for syncope.  All other systems reviewed and are negative.   Physical Exam Updated Vital Signs BP 124/75 (BP Location: Right Arm)   Pulse 79   Temp 98.5 F (36.9 C) (Oral)   Resp 18   Ht 5\' 7"  (1.702 m)   Wt 93 kg   SpO2 96%   BMI 32.11 kg/m   Physical Exam Vitals and nursing note reviewed.  Constitutional:      General: He is not in acute distress.    Appearance: He is well-developed. He is not diaphoretic.  HENT:     Head: Normocephalic and atraumatic.  Eyes:     Extraocular Movements: Extraocular movements intact.     Pupils: Pupils are equal, round, and reactive to light.  Cardiovascular:     Rate and Rhythm: Normal rate and regular rhythm.     Heart sounds: No murmur heard. No friction rub.  Pulmonary:     Effort: Pulmonary effort is normal. No respiratory distress.     Breath sounds: Normal breath sounds. No wheezing or rales.  Abdominal:     General: Bowel sounds are normal. There is no distension.     Palpations: Abdomen is soft.     Tenderness: There is no abdominal tenderness.  Musculoskeletal:        General: Normal range of motion.     Cervical back: Normal range of motion and neck supple.  Skin:    General: Skin is warm and dry.   Neurological:     General: No focal deficit present.     Mental Status: He is alert and oriented to person, place, and time.     Cranial Nerves: No cranial nerve deficit.     Motor: No weakness.     Coordination: Coordination normal.     ED Results / Procedures / Treatments   Labs (all labs ordered are listed, but only abnormal results are displayed) Labs Reviewed  BASIC METABOLIC PANEL  CBC WITH DIFFERENTIAL/PLATELET  TROPONIN I (HIGH SENSITIVITY)    EKG EKG Interpretation  Date/Time:  Tuesday  May 13 2020 23:54:27 EDT Ventricular Rate:  70 PR Interval:  175 QRS Duration: 95 QT Interval:  379 QTC Calculation: 409 R Axis:   -26 Text Interpretation: Sinus rhythm Borderline left axis deviation Abnormal R-wave progression, early transition Confirmed by Veryl Speak [phone]) on 05/14/2020 12:21:16 AM   Radiology No results found.  Procedures Procedures   Medications Ordered in ED Medications - No data to display  ED Course  I have reviewed the triage vital signs and the nursing notes.  Pertinent labs & imaging results that were available during my care of the patient were reviewed by me and considered in my medical decision making (see chart for details).    MDM Rules/Calculators/A&P  Patient seen by me for the second time in the past 7 days.  Patient has had another syncopal episode, this time with possible seizure activity witnessed by the family.  He is neurologically back to baseline with no focal deficits.  Laboratory studies are unremarkable and vital signs are stable.  Patient has been evaluated by Dr. Quinn Axe from neurology.  She is recommending admission for further work-up including MRI, EEG, and echocardiogram.  I have spoken with Dr. Myna Hidalgo who agrees to admit.  Final Clinical Impression(s) / ED Diagnoses Final diagnoses:  None    Rx / DC Orders ED Discharge Orders    None       Veryl Speak, MD 05/14/20 0105

## 2020-05-14 ENCOUNTER — Observation Stay (HOSPITAL_BASED_OUTPATIENT_CLINIC_OR_DEPARTMENT_OTHER): Payer: 59

## 2020-05-14 ENCOUNTER — Telehealth: Payer: Self-pay | Admitting: Student

## 2020-05-14 ENCOUNTER — Observation Stay (HOSPITAL_COMMUNITY): Payer: 59

## 2020-05-14 ENCOUNTER — Encounter (HOSPITAL_COMMUNITY): Payer: Self-pay | Admitting: Family Medicine

## 2020-05-14 ENCOUNTER — Other Ambulatory Visit: Payer: Self-pay

## 2020-05-14 DIAGNOSIS — I2510 Atherosclerotic heart disease of native coronary artery without angina pectoris: Secondary | ICD-10-CM

## 2020-05-14 DIAGNOSIS — R569 Unspecified convulsions: Secondary | ICD-10-CM | POA: Diagnosis not present

## 2020-05-14 DIAGNOSIS — E876 Hypokalemia: Secondary | ICD-10-CM | POA: Diagnosis not present

## 2020-05-14 DIAGNOSIS — R55 Syncope and collapse: Principal | ICD-10-CM

## 2020-05-14 DIAGNOSIS — S069X1D Unspecified intracranial injury with loss of consciousness of 30 minutes or less, subsequent encounter: Secondary | ICD-10-CM | POA: Diagnosis not present

## 2020-05-14 DIAGNOSIS — F419 Anxiety disorder, unspecified: Secondary | ICD-10-CM | POA: Diagnosis not present

## 2020-05-14 DIAGNOSIS — D869 Sarcoidosis, unspecified: Secondary | ICD-10-CM

## 2020-05-14 LAB — BASIC METABOLIC PANEL
Anion gap: 7 (ref 5–15)
Anion gap: 8 (ref 5–15)
BUN: 11 mg/dL (ref 6–20)
BUN: 12 mg/dL (ref 6–20)
CO2: 26 mmol/L (ref 22–32)
CO2: 29 mmol/L (ref 22–32)
Calcium: 8.1 mg/dL — ABNORMAL LOW (ref 8.9–10.3)
Calcium: 9 mg/dL (ref 8.9–10.3)
Chloride: 103 mmol/L (ref 98–111)
Chloride: 109 mmol/L (ref 98–111)
Creatinine, Ser: 1.02 mg/dL (ref 0.61–1.24)
Creatinine, Ser: 1.07 mg/dL (ref 0.61–1.24)
GFR, Estimated: >60 mL/min (ref >60)
GFR, Estimated: >60 mL/min (ref >60)
Glucose, Bld: 91 mg/dL (ref 70–99)
Glucose, Bld: 98 mg/dL (ref 70–99)
Potassium: 2.9 mmol/L — ABNORMAL LOW (ref 3.5–5.1)
Potassium: 3.4 mmol/L — ABNORMAL LOW (ref 3.5–5.1)
Sodium: 140 mmol/L (ref 135–145)
Sodium: 142 mmol/L (ref 135–145)

## 2020-05-14 LAB — CBC WITH DIFFERENTIAL/PLATELET
Abs Immature Granulocytes: 0 10*3/uL (ref 0.00–0.07)
Basophils Absolute: 0.1 10*3/uL (ref 0.0–0.1)
Basophils Relative: 1 %
Eosinophils Absolute: 0.2 10*3/uL (ref 0.0–0.5)
Eosinophils Relative: 4 %
HCT: 38 % — ABNORMAL LOW (ref 39.0–52.0)
Hemoglobin: 12.8 g/dL — ABNORMAL LOW (ref 13.0–17.0)
Immature Granulocytes: 0 %
Lymphocytes Relative: 36 %
Lymphs Abs: 1.7 10*3/uL (ref 0.7–4.0)
MCH: 29.6 pg (ref 26.0–34.0)
MCHC: 33.7 g/dL (ref 30.0–36.0)
MCV: 88 fL (ref 80.0–100.0)
Monocytes Absolute: 0.4 10*3/uL (ref 0.1–1.0)
Monocytes Relative: 8 %
Neutro Abs: 2.4 10*3/uL (ref 1.7–7.7)
Neutrophils Relative %: 51 %
Platelets: 261 10*3/uL (ref 150–400)
RBC: 4.32 MIL/uL (ref 4.22–5.81)
RDW: 13.3 % (ref 11.5–15.5)
WBC: 4.8 10*3/uL (ref 4.0–10.5)
nRBC: 0 % (ref 0.0–0.2)

## 2020-05-14 LAB — CBC
HCT: 40.8 % (ref 39.0–52.0)
Hemoglobin: 13.7 g/dL (ref 13.0–17.0)
MCH: 29 pg (ref 26.0–34.0)
MCHC: 33.6 g/dL (ref 30.0–36.0)
MCV: 86.4 fL (ref 80.0–100.0)
Platelets: 282 10*3/uL (ref 150–400)
RBC: 4.72 MIL/uL (ref 4.22–5.81)
RDW: 13.3 % (ref 11.5–15.5)
WBC: 4.9 10*3/uL (ref 4.0–10.5)
nRBC: 0 % (ref 0.0–0.2)

## 2020-05-14 LAB — ECHOCARDIOGRAM COMPLETE
AR max vel: 2.89 cm2
AV Area VTI: 2.32 cm2
AV Area mean vel: 2.53 cm2
AV Mean grad: 6 mmHg
AV Peak grad: 9.7 mmHg
Ao pk vel: 1.56 m/s
Area-P 1/2: 4.46 cm2
Calc EF: 62.5 %
Height: 67 in
S' Lateral: 2.9 cm
Single Plane A2C EF: 61.9 %
Single Plane A4C EF: 63.5 %
Weight: 3280 oz

## 2020-05-14 LAB — SARS CORONAVIRUS 2 (TAT 6-24 HRS): SARS Coronavirus 2: NEGATIVE

## 2020-05-14 LAB — MAGNESIUM: Magnesium: 2 mg/dL (ref 1.7–2.4)

## 2020-05-14 LAB — TROPONIN I (HIGH SENSITIVITY)
Troponin I (High Sensitivity): 3 ng/L (ref <18)
Troponin I (High Sensitivity): 4 ng/L (ref <18)

## 2020-05-14 LAB — HIV ANTIBODY (ROUTINE TESTING W REFLEX): HIV Screen 4th Generation wRfx: NONREACTIVE

## 2020-05-14 MED ORDER — ACETAMINOPHEN 650 MG RE SUPP: 650.0000 mg | Freq: Four times a day (QID) | RECTAL | Status: DC | PRN

## 2020-05-14 MED ORDER — POTASSIUM CHLORIDE CRYS ER 20 MEQ PO TBCR
40.0000 meq | EXTENDED_RELEASE_TABLET | Freq: Once | ORAL | Status: AC
  Administered 2020-05-14: 40 meq via ORAL
  Filled 2020-05-14: qty 2

## 2020-05-14 MED ORDER — POTASSIUM CHLORIDE CRYS ER 20 MEQ PO TBCR: 40.0000 meq | EXTENDED_RELEASE_TABLET | Freq: Once | ORAL | Status: DC

## 2020-05-14 MED ORDER — ONDANSETRON HCL 4 MG/2ML IJ SOLN: 4.0000 mg | Freq: Four times a day (QID) | INTRAMUSCULAR | Status: DC | PRN

## 2020-05-14 MED ORDER — SENNOSIDES-DOCUSATE SODIUM 8.6-50 MG PO TABS: 1.0000 | ORAL_TABLET | Freq: Every evening | ORAL | Status: DC | PRN

## 2020-05-14 MED ORDER — CITALOPRAM HYDROBROMIDE 20 MG PO TABS
20.0000 mg | ORAL_TABLET | Freq: Every day | ORAL | Status: DC
  Administered 2020-05-14 – 2020-05-15 (×2): 20 mg via ORAL
  Filled 2020-05-14 (×2): qty 1

## 2020-05-14 MED ORDER — HYDROCODONE-ACETAMINOPHEN 5-325 MG PO TABS: 1.0000 | ORAL_TABLET | Freq: Four times a day (QID) | ORAL | Status: DC | PRN

## 2020-05-14 MED ORDER — GADOBUTROL 1 MMOL/ML IV SOLN
9.4000 mL | Freq: Once | INTRAVENOUS | Status: AC | PRN
  Administered 2020-05-14: 9.4 mL via INTRAVENOUS

## 2020-05-14 MED ORDER — ACETAMINOPHEN 325 MG PO TABS
650.0000 mg | ORAL_TABLET | Freq: Four times a day (QID) | ORAL | Status: DC | PRN
  Administered 2020-05-14 (×3): 650 mg via ORAL
  Filled 2020-05-14 (×3): qty 2

## 2020-05-14 MED ORDER — ONDANSETRON HCL 4 MG PO TABS: 4.0000 mg | ORAL_TABLET | Freq: Four times a day (QID) | ORAL | Status: DC | PRN

## 2020-05-14 MED ORDER — MAGNESIUM SULFATE IN D5W 1-5 GM/100ML-% IV SOLN
1.0000 g | Freq: Once | INTRAVENOUS | Status: AC
  Administered 2020-05-14: 1 g via INTRAVENOUS
  Filled 2020-05-14: qty 100

## 2020-05-14 MED ORDER — CLONAZEPAM 0.5 MG PO TABS: 0.5000 mg | ORAL_TABLET | Freq: Three times a day (TID) | ORAL | Status: DC | PRN

## 2020-05-14 MED ORDER — ASPIRIN EC 81 MG PO TBEC
81.0000 mg | DELAYED_RELEASE_TABLET | Freq: Every day | ORAL | Status: DC
  Administered 2020-05-14 – 2020-05-15 (×2): 81 mg via ORAL
  Filled 2020-05-14 (×2): qty 1

## 2020-05-14 MED ORDER — ALBUTEROL SULFATE HFA 108 (90 BASE) MCG/ACT IN AERS: 1.0000 | INHALATION_SPRAY | Freq: Four times a day (QID) | RESPIRATORY_TRACT | Status: DC | PRN

## 2020-05-14 MED ORDER — LOSARTAN POTASSIUM 25 MG PO TABS
25.0000 mg | ORAL_TABLET | Freq: Every day | ORAL | Status: DC
  Administered 2020-05-14 – 2020-05-15 (×2): 25 mg via ORAL
  Filled 2020-05-14 (×2): qty 1

## 2020-05-14 MED ORDER — INDAPAMIDE 1.25 MG PO TABS
1.2500 mg | ORAL_TABLET | Freq: Every day | ORAL | Status: DC
  Administered 2020-05-14: 1.25 mg via ORAL
  Filled 2020-05-14 (×3): qty 1

## 2020-05-14 MED ORDER — LORAZEPAM 2 MG/ML IJ SOLN: 1.0000 mg | Freq: Once | INTRAMUSCULAR | Status: DC | PRN

## 2020-05-14 MED ORDER — PANTOPRAZOLE SODIUM 40 MG PO TBEC
40.0000 mg | DELAYED_RELEASE_TABLET | Freq: Every day | ORAL | Status: DC
  Administered 2020-05-14 – 2020-05-15 (×2): 40 mg via ORAL
  Filled 2020-05-14 (×2): qty 1

## 2020-05-14 MED ORDER — ATORVASTATIN CALCIUM 40 MG PO TABS
40.0000 mg | ORAL_TABLET | Freq: Every day | ORAL | Status: DC
  Administered 2020-05-14 – 2020-05-15 (×2): 40 mg via ORAL
  Filled 2020-05-14 (×2): qty 1

## 2020-05-14 MED ORDER — ZOLPIDEM TARTRATE 5 MG PO TABS
10.0000 mg | ORAL_TABLET | Freq: Every day | ORAL | Status: DC
  Administered 2020-05-14: 10 mg via ORAL
  Filled 2020-05-14: qty 2

## 2020-05-14 MED ORDER — POTASSIUM CHLORIDE 10 MEQ/100ML IV SOLN
10.0000 meq | INTRAVENOUS | Status: AC
  Administered 2020-05-14 (×2): 10 meq via INTRAVENOUS
  Filled 2020-05-14 (×2): qty 100

## 2020-05-14 NOTE — Progress Notes (Incomplete)
  Echocardiogram 2D Echocardiogram has been performed.  Jennette Dubin 05/14/2020, 2:36 PM

## 2020-05-14 NOTE — Progress Notes (Signed)
This is a pleasant 55 year old gentleman with a history of sarcoidosis, anxiety, CAD, hypertension and syncopal episode the night of 05/06/2020 presented yet again to the emergency department with another episode of syncope and transient loss of consciousness.  Initial CT head was unremarkable.  Patient was seen by neurology.  Admitted under hospitalist service.  No neurological deficit on examination.  MRI as well as MRA of the head and neck were unremarkable for any acute pathology.  Patient seen and examined early this morning in the ER.  He stated that he was still feeling foggy.  Denied having any other specific complaint.  Denied any shortness of breath, chest pain or any focal deficit.  On examination, he was alert and oriented, lungs clear to auscultation, no focal deficit on examination.  No dysarthria.  EEG as well as transthoracic echo pending.  Further work-up as dictated in the H&P for his syncope.  Potassium is still 3.4.  Was replaced earlier today.  Recheck in the morning.  Cardiac enzymes within normal range.  D-dimer normal as well.

## 2020-05-14 NOTE — Plan of Care (Signed)
Patient resting comfortably without any distress. PRN tylenol given for CO headache. Wife at bedside at all times. Patient went for ECHO and had EEG done and results pending. Able to ambulate in the room. SCDs in place. Bed kept in low position and locked. Call bell in reach. All needs met at this time. No further complains at this time.  Problem: Education: Goal: Knowledge of General Education information will improve Description: Including pain rating scale, medication(s)/side effects and non-pharmacologic comfort measures Outcome: Progressing   Problem: Health Behavior/Discharge Planning: Goal: Ability to manage health-related needs will improve Outcome: Progressing   Problem: Clinical Measurements: Goal: Ability to maintain clinical measurements within normal limits will improve Outcome: Progressing Goal: Will remain free from infection Outcome: Progressing Goal: Diagnostic test results will improve Outcome: Progressing Goal: Respiratory complications will improve Outcome: Progressing Goal: Cardiovascular complication will be avoided Outcome: Progressing   Problem: Activity: Goal: Risk for activity intolerance will decrease Outcome: Progressing   Problem: Nutrition: Goal: Adequate nutrition will be maintained Outcome: Progressing   Problem: Coping: Goal: Level of anxiety will decrease Outcome: Progressing   Problem: Elimination: Goal: Will not experience complications related to bowel motility Outcome: Progressing Goal: Will not experience complications related to urinary retention Outcome: Progressing   Problem: Pain Managment: Goal: General experience of comfort will improve Outcome: Progressing   Problem: Safety: Goal: Ability to remain free from injury will improve Outcome: Progressing   Problem: Skin Integrity: Goal: Risk for impaired skin integrity will decrease Outcome: Progressing

## 2020-05-14 NOTE — ED Notes (Signed)
Pt transport to floor

## 2020-05-14 NOTE — Telephone Encounter (Signed)
Mathew Moore returned Mathew Moore's call. She would like you to call her back [phone].

## 2020-05-14 NOTE — Telephone Encounter (Signed)
Please see previous phone note.  

## 2020-05-14 NOTE — Telephone Encounter (Signed)
Pt's wife called stating that pt is currently hospitalized, Heart monitor was put on pt 05/09/2020. Pt's wife wants to know if she should mail the monitor back now. Please advise

## 2020-05-14 NOTE — Procedures (Signed)
HISTORY:  55 year old male presents with syncope.  INTRODUCTION:  A digital EEG was performed in the laboratory using the standard international 10/20 system of electrode placement in addition to one channel of EKG monitoring.  Hyperventilation was not performed. Photic Stimulation was performed.  This tracing captures wakefulness through Stage II sleep.  The EEG was recording for approximately 30 minutes.  DESCRIPTION OF RECORD:  In the most alert state the alpha rhythm is 10 Hz in frequency, which is seen in the occipital region, attenuates with eye opening, and is bilaterally synchronous and symmetrical. Stage I sleep is characterized by disappearance of the alpha rhythm, slowing of the background, and absence of muscle and eye blink artifact.  Stage II sleep is characterized by the presence of sleep spindles.  No focal slowing, sharp waves, or epileptiform discharges are seen.  Photic stimulation was performed and did not produce any abnormalities.  Heart rate was regular/irregular at a rate of 70 bpm.  IMPRESSION:  This is a normal adult EEG in the awake, drowsy, and sleep states. No focal slowing, focal abnormalities, epileptiform discharges, or electrographic seizures are seen.  Of note, a normal EEG does not exclude the diagnosis of seizure disorder.  A repeat EEG or 24 hour EEG could be considered.  Clinical correlation is recommended.

## 2020-05-14 NOTE — Telephone Encounter (Signed)
Reached out to patients spouse with no answer. Left a message to call our office back.

## 2020-05-14 NOTE — Progress Notes (Signed)
EEG complete, results pending

## 2020-05-14 NOTE — Telephone Encounter (Signed)
Patient's wife returned call to our office. Patient is currently admitted in the hospital. ED removed patients heart monitor and patient's wife wanted to know if they should send it in now or wait. Informed spouse that she should go ahead and mail it in. We will see if we have all of the information we need from the days that he wore it, if not another monitor may be placed. Patient's spouse verbalized understanding.

## 2020-05-14 NOTE — Consult Note (Signed)
NEUROLOGY CONSULTATION NOTE   Date of service: May 14, 2020 Patient Name: Mathew Moore MRN:  [id number] DOB:  1965-11-02 Reason for consult: transient LOC x2 _ _ _   _ __   _ __ _ _  __ __   _ __   __ _  History of Present Illness   This is a 55 year old male with a past medical history significant for sarcoidosis, anxiety, CAD, hypertension, syncopal episode on 05/06/2020 who presents to the emergency department after another transient loss of consciousness.  The first episode occurred on 05/07/2020 and patient describes the following: Event preceded by palpitations, dizziness, crushing central chest pain.  Symptoms lasted for seconds and then he lost consciousness.  This occurred at work and was no witnessed by his coworkers.  He was not told how long he was out however he had a severe headache in the back of his head and confusion afterwards and suspects that he hit his head on the concrete during the event.  During the next 24 hours he did have one event where he sat down for 3 to 4 hours and does not remember anything that occurred during that time.  This was unwitnessed.  He was seen in the emergency department where he had a head CT that showed no acute intracranial abnormality, no PE on CTA chest, normal high-sensitivity troponin x2.  He was seen in cardiology clinic on May 09, 2020 and was given an ambulatory heart monitor at that time.  He did have the Zio patch on during the event that occurred today.  He has had some mild confusion since the incident and suspected head trauma on 05/07/2020 however wife states that yesterday this was worse than it had been.  He has also had headache since that time.  Wife at bedside did witness the second episode which occurred while he was standing up and was preceded by mild palpitations but no chest pain.  He lost consciousness again and again struck his head.  He was witnessed to have stiffening of his entire body and convulsive movements of his upper  extremities.  Is been scheduled to see neurology as an outpatient but this appointment is not for a few months.  At the present time he and his wife say that he does seem a bit confused, more so than usual.  He denies subjective focal neurologic deficits at this time.  No bowel or bladder incontinence, no tongue biting with either event.   ROS   10 point review of systems was performed and was negative except as described in HPI.  Past History   Past Medical History:  Diagnosis Date  . Anxiety    ocassional  . Atypical mole 08/24/2019   mild- right lower leg - anterior  . GERD (gastroesophageal reflux disease)   . Hypertension   . Sarcoidosis 07/2019   Past Surgical History:  Procedure Laterality Date  . BRONCHOSCOPY    . CLEFT LIP REPAIR    . COLONOSCOPY    . HERNIA REPAIR Left   . LEFT HEART CATH AND CORONARY ANGIOGRAPHY N/A 08/23/2018   Procedure: LEFT HEART CATH AND CORONARY ANGIOGRAPHY;  Surgeon: Burnell Blanks, MD;  Location: [facility];  Service: Cardiovascular;  Laterality: N/A;  . MEDIASTINOSCOPY N/A 07/20/2019   Procedure: MEDIASTINOSCOPY;  Surgeon: Melrose Nakayama, MD;  Location: [facility];  Service: Thoracic;  Laterality: N/A;  . NASAL SEPTUM SURGERY    . PLANTAR FASCIA SURGERY Right   .  VIDEO BRONCHOSCOPY WITH ENDOBRONCHIAL ULTRASOUND N/A 07/06/2019   Procedure: VIDEO BRONCHOSCOPY WITH ENDOBRONCHIAL ULTRASOUND;  Surgeon: Marshell Garfinkel, MD;  Location: [facility];  Service: Pulmonary;  Laterality: N/A;   Family History  Problem Relation Age of Onset  . Breast cancer Mother   . Heart disease Father   . Lung cancer Father    Social History   Socioeconomic History  . Marital status: Married    Spouse name: [name]  . Number of children: 2  . Years of education: Not on file  . Highest education level: Not on file  Occupational History  . Not on file  Tobacco Use  . Smoking status: Never Smoker  . Smokeless tobacco: Never Used  Vaping Use  .  Vaping Use: Never used  Substance and Sexual Activity  . Alcohol use: Not Currently  . Drug use: Not Currently  . Sexual activity: Not on file  Other Topics Concern  . Not on file  Social History Narrative  . Not on file   Social Determinants of Health   Financial Resource Strain: Low Risk   . Difficulty of Paying Living Expenses: Not hard at all  Food Insecurity: No Food Insecurity  . Worried About Charity fundraiser in the Last Year: Never true  . Ran Out of Food in the Last Year: Never true  Transportation Needs: No Transportation Needs  . Lack of Transportation (Medical): No  . Lack of Transportation (Non-Medical): No  Physical Activity: Sufficiently Active  . Days of Exercise per Week: 4 days  . Minutes of Exercise per Session: 40 min  Stress: No Stress Concern Present  . Feeling of Stress : Not at all  Social Connections: Moderately Integrated  . Frequency of Communication with Friends and Family: Three times a week  . Frequency of Social Gatherings with Friends and Family: Three times a week  . Attends Religious Services: More than 4 times per year  . Active Member of Clubs or Organizations: No  . Attends Archivist Meetings: Never  . Marital Status: Married   No Known Allergies  Medications   (Not in a hospital admission)    Vitals   Vitals:   05/13/20 2315 05/13/20 2316 05/14/20 0000  BP: 124/75  110/79  Pulse: 79  76  Resp: 18  (!) 22  Temp: 98.5 F (36.9 C)    TempSrc: Oral    SpO2: 96%  97%  Weight:  93 kg   Height:  5\' 7"  (1.702 m)      Body mass index is 32.11 kg/m.  Physical Exam   Physical Exam Gen: A&O x4, NAD HEENT: Atraumatic, normocephalic;mucous membranes moist; oropharynx clear, tongue without atrophy or fasciculations. Neck: Supple, trachea midline. Resp: CTAB, no w/r/r CV: RRR, no m/g/r; nml S1 and S2. 2+ symmetric peripheral pulses. Abd: soft/NT/ND; nabs x 4 quad Extrem: Nml bulk; no cyanosis, clubbing, or  edema.  Neuro: *MS: A&O x4. Does become confused with some multi-step commands, which wife states is not normal for him. *Speech: fluid, nondysarthric. Able to name and repeat *CN:    I: Deferred   II,III: PERRLA, VFF by confrontation, optic discs sharp   III,IV,VI: EOMI w/o nystagmus, no ptosis   V: Sensation intact from V1 to V3 to LT   VII: Eyelid closure was full.  Smile symmetric.   VIII: Hearing intact to voice   IX,X: Voice normal, palate elevates symmetrically    XI: SCM/trap 5/5 bilat   XII: Tongue protrudes midline,  no atrophy or fasciculations   *Motor:   Normal bulk.  No tremor, rigidity or bradykinesia. No pronator drift.    Strength: Dlt Bic Tri WrE WrF FgS Gr HF KnF KnE PlF DoF    Left 4 5 5 5 5 5 5 5 5 5 5 5     Right 5 5 5 5 5 5 5 5 5 5 5 5     *Sensory: Intact to light touch, pinprick, temperature vibration throughout. Symmetric. Propioception intact bilat.  No double-simultaneous extinction.  *Coordination:  Finger-to-nose, heel-to-shin, rapid alternating motions were intact. *Reflexes:  2+ and symmetric throughout without clonus; toes down-going bilat *Gait: deferred   Labs   CBC:  Recent Labs  Lab 05/07/20 0030  WBC 6.7  NEUTROABS 3.9  HGB 14.2  HCT 41.5  MCV 86.6  PLT 791    Basic Metabolic Panel:  Lab Results  Component Value Date   NA 139 05/07/2020   K 3.5 05/07/2020   CO2 25 05/07/2020   GLUCOSE 108 (H) 05/07/2020   BUN 23 (H) 05/07/2020   CREATININE 1.23 05/07/2020   CALCIUM 9.2 05/07/2020   GFRNONAA >60 05/07/2020   GFRAA >60 07/20/2019   Lipid Panel: No results found for: LDLCALC HgbA1c: No results found for: HGBA1C Urine Drug Screen: No results found for: LABOPIA, COCAINSCRNUR, LABBENZ, AMPHETMU, THCU, LABBARB  Alcohol Level No results found for: [facility]   Impression   Patient with hx sarcoid (no known neurologic manifestations) p/w transient LOC x2. Ddx includes syncope cardiogenic vs neurologic vs seizure. Confusion is favored  to be 2/2 head trauma w/ mild TBI  Recommendations   Admit to hospitalist service MRI brain wwo MRA H&N Routine EEG TTE Patient wearing Zio patch during event last night; correlate clinical episode with recorded rhythm  We will continue to follow. ______________________________________________________________________   Thank you for the opportunity to take part in the care of this patient. If you have any further questions, please contact the neurology consultation attending.  Signed,  Su Monks, MD [facility] [phone]  If 7pm- 7am, please page neurology on call as listed in [facility].

## 2020-05-14 NOTE — H&P (Signed)
History and Physical    Mathew Moore Mathew Moore:[id number] DOB: Jun 04, 1965 DOA: 05/13/2020  PCP: Manon Hilding, MD   Patient coming from: Home   Chief Complaint: transient LOC, shaking, headache, memory impairment   HPI: Mathew Moore is a 55 y.o. male with medical history significant for sarcoidosis, anxiety, CAD, hypertension, and syncopal episode the night of 05/06/2020, now presenting to the emergency department after another transient loss of consciousness.  Patient was evaluated in the emergency department early morning of 05/07/2020 after suffering a syncopal episode while at work.  Patient has had a headache since 05/07/2020 that he attributes to hitting his head during the syncopal episode, his head frequent palpitations, and lapses in memory, and then became acutely lightheaded last night before suffering a transient loss of consciousness.  Patient's wife reported that he appeared to "stiffen up" and had generalized shaking with the episode last night.  Patient is not believe he lost continence or bit his tongue during the episode.  He had severe chest pain on 05/07/2020 but not since then.  Palpitations have been a longstanding problem which the patient attributes to anxiety.  On 05/07/2020, patient had normal high-sensitivity troponin x2, no acute intracranial abnormality on head CT, and no PE on CTA chest.  He seen in his cardiology clinic on 05/09/2020 and was given ambulatory heart monitor.  ED Course: Upon arrival to the ED, patient is found to be afebrile, saturating well on room air, and with stable blood pressure.  EKG features sinus rhythm.  Chemistry panel notable for potassium 2.9.  CBC with mild normocytic anemia.  High-sensitivity troponin is normal.  Neurology was consulted by the ED physician and recommended medical admission with MRI brain, MRA head and neck, EEG, and echo.  Review of Systems:  All other systems reviewed and apart from HPI, are negative.  Past Medical History:   Diagnosis Date  . Anxiety    ocassional  . Atypical mole 08/24/2019   mild- right lower leg - anterior  . GERD (gastroesophageal reflux disease)   . Hypertension   . Sarcoidosis 07/2019    Past Surgical History:  Procedure Laterality Date  . BRONCHOSCOPY    . CLEFT LIP REPAIR    . COLONOSCOPY    . HERNIA REPAIR Left   . LEFT HEART CATH AND CORONARY ANGIOGRAPHY N/A 08/23/2018   Procedure: LEFT HEART CATH AND CORONARY ANGIOGRAPHY;  Surgeon: Burnell Blanks, MD;  Location: [facility];  Service: Cardiovascular;  Laterality: N/A;  . MEDIASTINOSCOPY N/A 07/20/2019   Procedure: MEDIASTINOSCOPY;  Surgeon: Melrose Nakayama, MD;  Location: [facility];  Service: Thoracic;  Laterality: N/A;  . NASAL SEPTUM SURGERY    . PLANTAR FASCIA SURGERY Right   . VIDEO BRONCHOSCOPY WITH ENDOBRONCHIAL ULTRASOUND N/A 07/06/2019   Procedure: VIDEO BRONCHOSCOPY WITH ENDOBRONCHIAL ULTRASOUND;  Surgeon: Marshell Garfinkel, MD;  Location: [facility];  Service: Pulmonary;  Laterality: N/A;    Social History:   reports that he has never smoked. He has never used smokeless tobacco. He reports previous alcohol use. He reports previous drug use.  No Known Allergies  Family History  Problem Relation Age of Onset  . Breast cancer Mother   . Heart disease Father   . Lung cancer Father      Prior to Admission medications   Medication Sig Start Date End Date Taking? Authorizing Provider  acetaminophen (TYLENOL) 500 MG tablet Take 1,000 mg by mouth every 6 (six) hours as needed for moderate pain or  headache.    Yes [provider]  albuterol (VENTOLIN HFA) 108 (90 Base) MCG/ACT inhaler Inhale 1-2 puffs into the lungs every 6 (six) hours as needed for wheezing or shortness of breath. 09/11/19  Yes Martyn Ehrich, NP  aspirin EC 81 MG tablet Take 81 mg by mouth daily. Swallow whole.   Yes [provider]  atorvastatin (LIPITOR) 40 MG tablet Take 40 mg by mouth daily.   Yes [provider]  Cholecalciferol (VITAMIN D) 50 MCG (2000 UT) CAPS Take 2,000 Units by mouth daily.   Yes [provider]  citalopram (CELEXA) 20 MG tablet Take 20 mg by mouth daily.   Yes [provider]  clonazePAM (KLONOPIN) 0.5 MG tablet Take 0.5 mg by mouth 3 (three) times daily as needed for anxiety.   Yes [provider]  indapamide (LOZOL) 1.25 MG tablet Take 1.25 mg by mouth daily. 04/09/20  Yes [provider]  losartan (COZAAR) 25 MG tablet Take 25 mg by mouth daily.   Yes [provider]  omeprazole (PRILOSEC) 40 MG capsule Take 1 capsule (40 mg total) by mouth in the morning and at bedtime. 11/20/19  Yes Spero Geralds, MD  testosterone cypionate (DEPOTESTOSTERONE CYPIONATE) 200 MG/ML injection Inject 200 mg into the muscle every 30 (thirty) days. 05/01/20  Yes [provider]  zolpidem (AMBIEN) 10 MG tablet Take 10 mg by mouth at bedtime.  05/01/19  Yes [provider]    Physical Exam: Vitals:   05/14/20 0000 05/14/20 0030 05/14/20 0130 05/14/20 0145  BP: 110/79 110/74 92/75 104/72  Pulse: 76 69 66 73  Resp: (!) 22 (!) 6 17 10   Temp:      TempSrc:      SpO2: 97% 95% 95% 97%  Weight:      Height:        Constitutional: NAD, calm  Eyes: PERTLA, lids and conjunctivae normal ENMT: Mucous membranes are moist. Posterior pharynx clear of any exudate or lesions.   Neck: normal, supple, no masses, no thyromegaly Respiratory: no wheezing, no crackles. No accessory muscle use.  Cardiovascular: S1 & S2 heard, regular rate and rhythm. No extremity edema.   Abdomen: No distension, no tenderness, soft. Bowel sounds active.  Musculoskeletal: no clubbing / cyanosis. No joint deformity upper and lower extremities.   Skin: no significant rashes, lesions, ulcers. Warm, dry, well-perfused. Neurologic: CN 2-12 grossly intact. Sensation intact. Strength 5/5 in all 4 limbs.  Psychiatric: Alert and oriented to person, place, and  situation. Pleasant and cooperative.    Labs and Imaging on Admission: I have personally reviewed following labs and imaging studies  CBC: Recent Labs  Lab 05/13/20 2342  WBC 4.8  NEUTROABS 2.4  HGB 12.8*  HCT 38.0*  MCV 88.0  PLT 782   Basic Metabolic Panel: Recent Labs  Lab 05/13/20 2342  NA 142  K 2.9*  CL 109  CO2 26  GLUCOSE 91  BUN 11  CREATININE 1.02  CALCIUM 8.1*   GFR: Estimated Creatinine Clearance: 92.1 mL/min (by C-G formula based on SCr of 1.02 mg/dL). Liver Function Tests: No results for input(s): AST, ALT, ALKPHOS, BILITOT, PROT, ALBUMIN in the last 168 hours. No results for input(s): LIPASE, AMYLASE in the last 168 hours. No results for input(s): AMMONIA in the last 168 hours. Coagulation Profile: No results for input(s): INR, PROTIME in the last 168 hours. Cardiac Enzymes: No results for input(s): CKTOTAL, CKMB, CKMBINDEX, TROPONINI in the last 168 hours. BNP (  last 3 results) No results for input(s): PROBNP in the last 8760 hours. HbA1C: No results for input(s): HGBA1C in the last 72 hours. CBG: Recent Labs  Lab 05/13/20 2351  GLUCAP 99   Lipid Profile: No results for input(s): CHOL, HDL, LDLCALC, TRIG, CHOLHDL, LDLDIRECT in the last 72 hours. Thyroid Function Tests: No results for input(s): TSH, T4TOTAL, FREET4, T3FREE, THYROIDAB in the last 72 hours. Anemia Panel: No results for input(s): VITAMINB12, FOLATE, FERRITIN, TIBC, IRON, RETICCTPCT in the last 72 hours. Urine analysis: No results found for: COLORURINE, APPEARANCEUR, LABSPEC, PHURINE, GLUCOSEU, HGBUR, BILIRUBINUR, KETONESUR, PROTEINUR, UROBILINOGEN, NITRITE, LEUKOCYTESUR Sepsis Labs: @LABRCNTIP (procalcitonin:4,lacticidven:4) )No results found for this or any previous visit (from the past 240 hour(s)).   Radiological Exams on Admission: No results found.  EKG: Independently reviewed. Sinus rhythm.   Assessment/Plan   1. Syncope; seizure-like activity; mild TBI  - Presents  after a transient LOC with generalized seizure-like activity reported by family; he also had syncopal episode the night of 4/5, has had headache since then and has no recollection of a 5-hr period from 4/11   - He reports frequent palpitations, saw cardiology on 4/8 and was given an ambulatory heart monitor  - Neurology consulting and much appreciated  - Continue cardiac monitoring, check orthostatic vitals, check MRI brain with and w/o contrast, MRA head & neck, and EEG    2. Palpitations; CAD  - Reports frequent palpitations, had severe chest pain on 4/6 with negative troponins and no improvement with NTG  - Hx of mild non-obstructive CAD in 08/2018  - Continue cardiac monitoring, ASA, Lipitor    3. Hypokalemia  - Serum potassium is 2.9 on admission  - Replace, repeat chem panel   4. Anxiety  - Continue Celexa, as-needed Klonopin    5. Hypertension  - BP at goal  - Continue losartan and indapamide   6. Sarcoidosis  - Appears to be stable/quiescent, no longer on steroids     DVT prophylaxis:SCDs Code Status: Full  Level of Care: Level of care: Telemetry Medical Family Communication: None present  Disposition Plan:  Patient is from: Home  Anticipated d/c is to: Home  Anticipated d/c date is: 05/15/20 Patient currently: Pending MRIs, echo, EEG, and cardiac monitoring   Consults called: Neurology  Admission status: Observation    Vianne Bulls, MD [facility]  05/14/2020, 3:16 AM

## 2020-05-15 DIAGNOSIS — R55 Syncope and collapse: Secondary | ICD-10-CM | POA: Diagnosis not present

## 2020-05-15 DIAGNOSIS — R569 Unspecified convulsions: Secondary | ICD-10-CM | POA: Diagnosis not present

## 2020-05-15 LAB — BASIC METABOLIC PANEL
Anion gap: 5 (ref 5–15)
BUN: 12 mg/dL (ref 6–20)
CO2: 28 mmol/L (ref 22–32)
Calcium: 8.8 mg/dL — ABNORMAL LOW (ref 8.9–10.3)
Chloride: 105 mmol/L (ref 98–111)
Creatinine, Ser: 1.04 mg/dL (ref 0.61–1.24)
GFR, Estimated: >60 mL/min (ref >60)
Glucose, Bld: 111 mg/dL — ABNORMAL HIGH (ref 70–99)
Potassium: 3.5 mmol/L (ref 3.5–5.1)
Sodium: 138 mmol/L (ref 135–145)

## 2020-05-15 LAB — CBC
HCT: 41 % (ref 39.0–52.0)
Hemoglobin: 13.9 g/dL (ref 13.0–17.0)
MCH: 29.7 pg (ref 26.0–34.0)
MCHC: 33.9 g/dL (ref 30.0–36.0)
MCV: 87.6 fL (ref 80.0–100.0)
Platelets: 284 10*3/uL (ref 150–400)
RBC: 4.68 MIL/uL (ref 4.22–5.81)
RDW: 13.3 % (ref 11.5–15.5)
WBC: 5 10*3/uL (ref 4.0–10.5)
nRBC: 0 % (ref 0.0–0.2)

## 2020-05-15 LAB — MAGNESIUM: Magnesium: 2.1 mg/dL (ref 1.7–2.4)

## 2020-05-15 MED ORDER — THIAMINE HCL 100 MG PO TABS
100.0000 mg | ORAL_TABLET | Freq: Every day | ORAL | Status: AC
  Administered 2020-05-15: 100 mg via ORAL
  Filled 2020-05-15: qty 1

## 2020-05-15 NOTE — Progress Notes (Signed)
Neurology Progress Note Subjective: No acute overnight events EEG complete without focal slowing, focal abnormalities, epileptiform discharges, or seizures Patient endorses that his father had seizures but also states he is unsure if they were related to ETOH or not. Patient endorses remembering "voltage like shocks" causing bilateral arm stiffness during his seizure-like episode at home. Has been endorsing lightheadedness and some confusion at home consistently over the last 2 weeks.  Exam: Vitals:   05/15/20 0738 05/15/20 0859  BP:  126/86  Pulse:  89  Resp:    Temp:    SpO2: 96%    Gen: Standing at bedside, in no acute distress Resp: non-labored breathing, no respiratory distress Abd: soft, non-distended, non-tender  Neuro: Mental Status: Patient is awake, alert, and oriented x 3. Patient is able to provide a clear and coherent history of present illness. His speech is intact without dysarthria or aphasia. No neglect noted.  CN: Pupils are equal, round, and reactive to light bilaterally, visual fields are full, lids elevate fully and symmetrically, EOMI, face sensation to light touch is intact and symmetric, patient face is symmetric resting and smiling, hearing is intact to voice, palate rises symmetrically, phonation normal, shoulder shrug is symmetric, tongue protrudes midline without fasciculations.  Motor: 5/5 strength throughout without vertical drift. Tone and bulk are normal.  Sensory: Sensation to light touch intact and symmetric in upper and lower extremities. Gait: Steady without assistance  Pertinent Labs: CBC    Component Value Date/Time   WBC 5.0 05/15/2020 0202   RBC 4.68 05/15/2020 0202   HGB 13.9 05/15/2020 0202   HCT 41.0 05/15/2020 0202   PLT 284 05/15/2020 0202   MCV 87.6 05/15/2020 0202   MCH 29.7 05/15/2020 0202   MCHC 33.9 05/15/2020 0202   RDW 13.3 05/15/2020 0202   LYMPHSABS 1.7 05/13/2020 2342   MONOABS 0.4 05/13/2020 2342   EOSABS 0.2  05/13/2020 2342   BASOSABS 0.1 05/13/2020 2342   CMP     Component Value Date/Time   NA 138 05/15/2020 0202   K 3.5 05/15/2020 0202   CL 105 05/15/2020 0202   CO2 28 05/15/2020 0202   GLUCOSE 111 (H) 05/15/2020 0202   BUN 12 05/15/2020 0202   CREATININE 1.04 05/15/2020 0202   CALCIUM 8.8 (L) 05/15/2020 0202   PROT 6.4 (L) 05/07/2020 0030   ALBUMIN 4.0 05/07/2020 0030   AST 27 05/07/2020 0030   ALT 26 05/07/2020 0030   ALKPHOS 71 05/07/2020 0030   BILITOT 0.8 05/07/2020 0030   GFRNONAA >60 05/15/2020 0202   GFRAA >60 07/20/2019 0716  Imaging Reviewed: CT Head 1. No acute intracranial abnormalities. 2. Chronic paranasal sinus disease  MRI Brain: 1. Normal for age MRI appearance of the brain. No acute intracranial abnormality. 2. Moderate left maxillary sinus disease.  MRA Head: Normal intracranial MRA.  MRA Neck: Normal Neck MRA.  Routine EEG: "IMPRESSION:  This is a normal adult EEG in the awake, drowsy, and sleep states. No focal slowing, focal abnormalities, epileptiform discharges, or electrographic seizures are seen.  Of note, a normal EEG does not exclude the diagnosis of seizure disorder.  A repeat EEG or 24 hour EEG could be considered.  Clinical correlation is recommended."  TTE: 1. Left ventricular ejection fraction, by estimation, is 60 to 65%. The left ventricle has normal function. The left ventricle has no regional wall motion abnormalities. There is mild concentric left ventricular hypertrophy. Left ventricular diastolic parameters were normal.  2. Right ventricular systolic function is normal. The  right ventricular size is normal.  3. The mitral valve is normal in structure. Trivial mitral valve regurgitation.  4. The aortic valve is tricuspid. Aortic valve regurgitation is not visualized. No aortic stenosis is present.  5. The inferior vena cava is normal in size with greater than 50% respiratory variability, suggesting right atrial pressure of 3 mmHg.    Impression: 55 year old male presenting following 2 syncopal episodes at home/work with one episode family witnessed stiffening and jerking of all four extremities with two weeks of varying degrees of nausea and lightheadedness.  - Examination without neurologic deficits. - EEG, MRI, CTA head and neck without abnormalities. Lab work unremarkable.  - DDx includes syncope related to hypotension related to new diuretic for blood pressure control. Other differential includes seizure but less likely due to no history of seizures, no known provocation for seizure activity, and patient recalling feeling "voltage-like shocks" causing him to stiffen during witnessed seizure-like activity. Previous witnessed syncopal event the week prior was without abnormal movements or stiffening. Other reported events at home with nausea and dizziness most likely related to hypotension / orthostasis 2/2 new diuretic blood pressure medication. Some episodes of nausea, slurred speech, and confusion may be related to alcohol intake.   Recommendations: - Orthostatic vital signs - Follow up with outpatient neurology for continued evaluation of dizziness and seizure-like episode - Do not favor adding AED at this time due to no history of seizures and no clear evidence of seizures. Discussed return precautions for further seizure-like activity.  - Thiamine 100 mg PO once ordered prior to discharge - No further neurology recommendations at this time  - Because seizure is within the differential diagnosis list and with multiple syncopal episodes: Discussed with patient [facility] statutes, patients with seizures are not allowed to drive until they have been seizure-free for six months. Use caution when using heavy equipment or power tools. Avoid working on ladders or at heights. Take showers instead of baths. Ensure the water temperature is not too high on the home water heater. Do not go swimming alone. Do not lock yourself  in a room alone (i.e. bathroom). When caring for infants or small children, sit down when holding, feeding, or changing them to minimize risk of injury to the child in the event you have a seizure. Maintain good sleep hygiene. Avoid alcohol.   Anibal Henderson, AGACNP-BC [facility] [phone]  I have seen the patient and reviewed the above note.  He has preserved memory during his bilateral shaking episode, stating that he could hear but not speak.  He remembers his family member saying things that his wife mentioned were said.  This would argue against an epileptic nature to these movements, though convulsive syncope also would be unlikely to present this way.  In any case, I would not favor starting antiepileptic therapy with this description.  If he continues having recurrent spells, this could be considered a later time, but would not at this time.  Roland Rack, MD [facility] [phone]  If 7pm- 7am, please page neurology on call as listed in [place].

## 2020-05-15 NOTE — Discharge Summary (Signed)
Physician Discharge Summary  Mathew Moore SKA:[id number] DOB: Jun 11, 1965 DOA: 05/13/2020  PCP: Manon Hilding, MD  Admit date: 05/13/2020 Discharge date: 05/15/2020    Admitted From: Home Disposition: Home  Recommendations for Outpatient Follow-up:  1. Follow up with PCP in 1-2 weeks 2. Follow-up with your primary cardiologist within 1 week 3. Please obtain BMP/CBC in one week 4. Please follow up with your PCP on the following pending results: Unresulted Labs (From admission, onward)          Start     Ordered   05/14/20 2035  Basic metabolic panel  Daily,   R      05/14/20 0246   05/14/20 0500  CBC  Daily,   R      05/14/20 0246            Home Health: None Equipment/Devices: None  Discharge Condition: Stable CODE STATUS: Full code Diet recommendation: Cardiac  Subjective: Patient seen and examined.  Continues to feel well.  No further episode of dizziness, vertigo, syncope or any seizure-like activity.  Wife at the bedside.  Patient ready to go home.  Following HPI and ED course copy pasted from H&P dictated by my nocturnist colleague Dr. Christia Reading. HPI: Mathew Moore is a 55 y.o. male with medical history significant for sarcoidosis, anxiety, CAD, hypertension, and syncopal episode the night of 05/06/2020, now presenting to the emergency department after another transient loss of consciousness.  Patient was evaluated in the emergency department early morning of 05/07/2020 after suffering a syncopal episode while at work.  Patient has had a headache since 05/07/2020 that he attributes to hitting his head during the syncopal episode, his head frequent palpitations, and lapses in memory, and then became acutely lightheaded last night before suffering a transient loss of consciousness.  Patient's wife reported that he appeared to "stiffen up" and had generalized shaking with the episode last night.  Patient is not believe he lost continence or bit his tongue during the episode.  He  had severe chest pain on 05/07/2020 but not since then.  Palpitations have been a longstanding problem which the patient attributes to anxiety.  On 05/07/2020, patient had normal high-sensitivity troponin x2, no acute intracranial abnormality on head CT, and no PE on CTA chest.  He seen in his cardiology clinic on 05/09/2020 and was given ambulatory heart monitor.  ED Course: Upon arrival to the ED, patient is found to be afebrile, saturating well on room air, and with stable blood pressure.  EKG features sinus rhythm.  Chemistry panel notable for potassium 2.9.  CBC with mild normocytic anemia.  High-sensitivity troponin is normal.  Neurology was consulted by the ED physician and recommended medical admission with MRI brain, MRA head and neck, EEG, and echo.  Brief/Interim Summary: Patient was admitted with syncope.  He was seen by neurology on initial consultation but no follow-up was done by neurology.  Underwent CT head which was unremarkable.  Neurology recommended MRI brain as well as MRA head and neck as well as EEG.  All of those were done and were unremarkable.  Patient was observed and did not have any further episodes of dizziness, vertigo or seizure-like activity.  Transthoracic echo ruled out valvular heart disease as well.  No arrhythmia was noted on telemetry.  Patient is stable.  Labs are stable.  Patient's losartan has been held and his blood pressure is either low normal or within normal range and based off of that, I have taken the  liberty to discontinue his losartan at the time of discharge but will continue rest of his medications.  Patient was already being evaluated for possible arrhythmia with Zio patch by his cardiologist.  Patient needs to follow-up with his cardiologist for further recommendations regarding that.  MRI brain however also shows maxillary sinusitis which is chronic.  Per patient, he recently completed a course of antibiotics for that.  I have recommended that he follows up  with ENT for further evaluation of that.  As of now, no concrete source of his syncope has been found.  Potential suspected sources could be his chronic sinusitis or some sort of arrhythmia that still needs to be discovered.  Had a lengthy discussion with patient, his wife where she asked me several questions which were answered to the best of her satisfaction.  Patient's electrolytes/potassium were replaced and are within normal range today.  Discharge Diagnoses:  Principal Problem:   Syncope Active Problems:   Sarcoidosis   Hypokalemia   CAD (coronary artery disease)   Mild TBI (HCC)   Anxiety   Seizure-like activity ([place])    Discharge Instructions   Allergies as of 05/15/2020   No Known Allergies     Medication List    STOP taking these medications   losartan 25 MG tablet Commonly known as: COZAAR     TAKE these medications   acetaminophen 500 MG tablet Commonly known as: TYLENOL Take 1,000 mg by mouth every 6 (six) hours as needed for moderate pain or headache.   albuterol 108 (90 Base) MCG/ACT inhaler Commonly known as: Ventolin HFA Inhale 1-2 puffs into the lungs every 6 (six) hours as needed for wheezing or shortness of breath.   aspirin EC 81 MG tablet Take 81 mg by mouth daily. Swallow whole.   atorvastatin 40 MG tablet Commonly known as: LIPITOR Take 40 mg by mouth daily.   citalopram 20 MG tablet Commonly known as: CELEXA Take 20 mg by mouth daily.   clonazePAM 0.5 MG tablet Commonly known as: KLONOPIN Take 0.5 mg by mouth 3 (three) times daily as needed for anxiety.   indapamide 1.25 MG tablet Commonly known as: LOZOL Take 1.25 mg by mouth daily.   omeprazole 40 MG capsule Commonly known as: PRILOSEC Take 1 capsule (40 mg total) by mouth in the morning and at bedtime.   testosterone cypionate 200 MG/ML injection Commonly known as: DEPOTESTOSTERONE CYPIONATE Inject 200 mg into the muscle every 30 (thirty) days.   Vitamin D 50 MCG (2000 UT)  Caps Take 2,000 Units by mouth daily.   zolpidem 10 MG tablet Commonly known as: AMBIEN Take 10 mg by mouth at bedtime.       Follow-up Information    Sasser, Silvestre Moment, MD Follow up in 1 week(s).   Specialty: Family Medicine Contact information: [place] [address] [phone]        Josue Hector, MD .   Specialty: Cardiology Contact information: [phone] [place] [address] [phone]              No Known Allergies  Consultations: Neurology   Procedures/Studies: CT Head Wo Contrast  Result Date: 05/07/2020 CLINICAL DATA:  Syncopal episode with head trauma. EXAM: CT HEAD WITHOUT CONTRAST TECHNIQUE: Contiguous axial images were obtained from the base of the skull through the vertex without intravenous contrast. COMPARISON:  06/01/2019 FINDINGS: Brain: No evidence of acute infarction, hemorrhage, hydrocephalus, extra-axial collection or mass lesion/mass effect. Vascular: No  hyperdense vessel or unexpected calcification. Skull: Calvarium appears intact. Sinuses/Orbits: Mucosal thickening in the paranasal sinuses with near complete opacification of the left maxillary antrum. No acute air-fluid levels. Mastoid air cells are clear. Other: None. IMPRESSION: 1. No acute intracranial abnormalities. 2. Chronic paranasal sinus disease. Electronically Signed   By: Lucienne Capers M.D.   On: 05/07/2020 02:42   CT Angio Chest PE W and/or Wo Contrast  Result Date: 05/07/2020 CLINICAL DATA:  Pulmonary embolus suspected with high probability. Sudden onset of chest discomfort and nausea. Syncopal episode. Chest pain radiating down the left arm. EXAM: CT ANGIOGRAPHY CHEST WITH CONTRAST TECHNIQUE: Multidetector CT imaging of the chest was performed using the standard protocol during bolus administration of intravenous contrast. Multiplanar CT image reconstructions and MIPs were obtained to evaluate the vascular anatomy. CONTRAST:  9mL OMNIPAQUE IOHEXOL  350 MG/ML SOLN COMPARISON:  11/13/2019 FINDINGS: Cardiovascular: There is good opacification of the central and segmental pulmonary arteries. No focal filling defects. No evidence of significant pulmonary embolus. Normal caliber thoracic aorta. No aortic dissection. Heart size is normal. No pericardial effusions. Mild coronary artery calcification. Mediastinum/Nodes: Scattered lymph nodes in the mediastinum without pathologic enlargement. Largest subcarinal nodes measure about 12 mm short axis dimension. Likely reactive. Esophagus is decompressed. Lungs/Pleura: Mild dependent atelectasis in the lung bases. No airspace disease or consolidation. No pleural effusions. No pneumothorax. Airways are patent. Upper Abdomen: No acute abnormalities demonstrated in the visualized upper abdomen. Musculoskeletal: No chest wall abnormality. No acute or significant osseous findings. Review of the MIP images confirms the above findings. IMPRESSION: No evidence of significant pulmonary embolus. No evidence of active pulmonary disease. Coronary artery calcification. Electronically Signed   By: Lucienne Capers M.D.   On: 05/07/2020 02:40   MR ANGIO HEAD WO CONTRAST  Result Date: 05/14/2020 CLINICAL DATA:  55 year old male with altered mental status, syncope, possible seizure activity. EXAM: MRA HEAD WITHOUT CONTRAST TECHNIQUE: Angiographic images of the Circle of Willis were obtained using MRA technique without intravenous contrast. COMPARISON:  Neck MRA, brain MRI without and with contrast today. FINDINGS: Antegrade flow in the posterior circulation with codominant distal vertebral arteries. Mildly fenestrated vertebrobasilar junction (normal variant). No distal vertebral stenosis. Normal left PICA origin. Right AICA appears dominant and patent. Patent basilar artery without stenosis. Normal SCA and PCA origins. Posterior communicating arteries are diminutive or absent. Bilateral PCA branches appear normal. Antegrade flow in  both ICA siphons. No siphon stenosis. Normal ophthalmic artery origins. Normal carotid termini, MCA and ACA origins. Normal anterior communicating artery. The right ACA A2 is mildly dominant throughout, normal variant. Visible ACA branches are within normal limits. Left MCA M1 segment bifurcates early without stenosis. Right MCA M1 and bifurcation are patent without stenosis. Visible bilateral MCA branches are within normal limits. IMPRESSION: Normal intracranial MRA. Electronically Signed   By: Genevie Ann M.D.   On: 05/14/2020 05:29   MR ANGIO NECK W WO CONTRAST  Result Date: 05/14/2020 CLINICAL DATA:  55 year old male with altered mental status, syncope, possible seizure activity. EXAM: MRA NECK WITHOUT AND WITH CONTRAST TECHNIQUE: Multiplanar and multiecho pulse sequences of the neck were obtained without and with intravenous contrast. Angiographic images of the neck were obtained using MRA technique without and with intravenous contrast. CONTRAST:  9.46mL GADAVIST GADOBUTROL 1 MMOL/ML IV SOLN in conjunction with contrast enhanced imaging of the brain reported separately. COMPARISON:  Brain MRI today reported separately. FINDINGS: Precontrast time-of-flight images demonstrate antegrade flow in both cervical carotid and vertebral arteries. Codominant vertebrals. Carotid  bifurcations appear normal. Post-contrast neck MRA images reveal a 3 vessel arch configuration. The arch and proximal great vessels appear normal. Right CCA, right carotid bifurcation and cervical right ICA are normal. Left CCA, left carotid bifurcation and cervical left ICA are normal. Both proximal subclavian arteries and vertebral artery origins are normal. Codominant cervical vertebral arteries appear patent and normal to the skull base. Grossly normal visible anterior and posterior intracranial circulation. Grossly normal visible neck soft tissues. IMPRESSION: Normal Neck MRA. Electronically Signed   By: Genevie Ann M.D.   On: 05/14/2020 05:26    MR BRAIN W WO CONTRAST  Result Date: 05/14/2020 CLINICAL DATA:  55 year old male with altered mental status, syncope, possible seizure activity. EXAM: MRI HEAD WITHOUT AND WITH CONTRAST TECHNIQUE: Multiplanar, multiecho pulse sequences of the brain and surrounding structures were obtained without and with intravenous contrast. CONTRAST:  9.37mL GADAVIST GADOBUTROL 1 MMOL/ML IV SOLN COMPARISON:  CT head without contrast 05/07/2020, 06/01/2019. FINDINGS: Brain: Normal cerebral volume. No restricted diffusion to suggest acute infarction. No midline shift, mass effect, evidence of mass lesion, ventriculomegaly, extra-axial collection or acute intracranial hemorrhage. Cervicomedullary junction and pituitary are within normal limits. On thin slice coronal imaging hippocampal formations and other mesial temporal lobe structures appear symmetric and within normal limits. Cerebral gyral morphology appears within normal limits. No migrational abnormality identified. Pearline Cables and white matter signal is within normal limits for age throughout the brain. No cortical encephalomalacia or chronic cerebral blood products identified. Deep gray matter nuclei, brainstem and cerebellum are within normal limits. No abnormal enhancement identified.  No dural thickening. Vascular: Major intracranial vascular flow voids are preserved. The major dural venous sinuses are enhancing and appear to be patent. Skull and upper cervical spine: Negative visible cervical spine. Visualized bone marrow signal is within normal limits. Sinuses/Orbits: Negative orbits. Moderate left maxillary sinus mucosal thickening. Trace sinus mucosal thickening otherwise. No sinus fluid levels. Other: Mastoids are well aerated. Grossly normal visible internal auditory structures. Visible scalp and face appear negative. IMPRESSION: 1. Normal for age MRI appearance of the brain. No acute intracranial abnormality. 2. Moderate left maxillary sinus disease. Electronically  Signed   By: Genevie Ann M.D.   On: 05/14/2020 05:24   EEG adult  Result Date: 05/14/2020 Theodosia Blender, MD     05/14/2020  3:04 PM HISTORY:  55 year old male presents with syncope. INTRODUCTION:  A digital EEG was performed in the laboratory using the standard international 10/20 system of electrode placement in addition to one channel of EKG monitoring.  Hyperventilation was not performed. Photic Stimulation was performed.  This tracing captures wakefulness through Stage II sleep.  The EEG was recording for approximately 30 minutes. DESCRIPTION OF RECORD:  In the most alert state the alpha rhythm is 10 Hz in frequency, which is seen in the occipital region, attenuates with eye opening, and is bilaterally synchronous and symmetrical. Stage I sleep is characterized by disappearance of the alpha rhythm, slowing of the background, and absence of muscle and eye blink artifact.  Stage II sleep is characterized by the presence of sleep spindles.  No focal slowing, sharp waves, or epileptiform discharges are seen. Photic stimulation was performed and did not produce any abnormalities. Heart rate was regular/irregular at a rate of 70 bpm. IMPRESSION:  This is a normal adult EEG in the awake, drowsy, and sleep states. No focal slowing, focal abnormalities, epileptiform discharges, or electrographic seizures are seen.  Of note, a normal EEG does not exclude the diagnosis  of seizure disorder.  A repeat EEG or 24 hour EEG could be considered.  Clinical correlation is recommended.   ECHOCARDIOGRAM COMPLETE  Result Date: 05/14/2020    ECHOCARDIOGRAM REPORT   Patient Name:   YOSGART PAVEY Date of Exam: 05/14/2020 Medical Rec #:  [id number]        Height:       67.0 in Accession #:    [id number]       Weight:       205.0 lb Date of Birth:  Jul 22, 1965         BSA:          2.044 m Patient Age:    85 years         BP:           104/53 mmHg Patient Gender: M                HR:           66 bpm. Exam Location:  Inpatient  Procedure: 2D Echo Indications:    Syncope  History:        Patient has no prior history of Echocardiogram examinations.                 Risk Factors:Hypertension.  Sonographer:    Cammy Brochure Referring Phys: [id number] Gray  1. Left ventricular ejection fraction, by estimation, is 60 to 65%. The left ventricle has normal function. The left ventricle has no regional wall motion abnormalities. There is mild concentric left ventricular hypertrophy. Left ventricular diastolic parameters were normal.  2. Right ventricular systolic function is normal. The right ventricular size is normal.  3. The mitral valve is normal in structure. Trivial mitral valve regurgitation.  4. The aortic valve is tricuspid. Aortic valve regurgitation is not visualized. No aortic stenosis is present.  5. The inferior vena cava is normal in size with greater than 50% respiratory variability, suggesting right atrial pressure of 3 mmHg. Comparison(s): No prior Echocardiogram. FINDINGS  Left Ventricle: Left ventricular ejection fraction, by estimation, is 60 to 65%. The left ventricle has normal function. The left ventricle has no regional wall motion abnormalities. The left ventricular internal cavity size was normal in size. There is  mild concentric left ventricular hypertrophy. Left ventricular diastolic parameters were normal. Right Ventricle: The right ventricular size is normal. No increase in right ventricular wall thickness. Right ventricular systolic function is normal. Left Atrium: Left atrial size was normal in size. Right Atrium: Right atrial size was normal in size. Pericardium: There is no evidence of pericardial effusion. Mitral Valve: The mitral valve is normal in structure. Trivial mitral valve regurgitation. Tricuspid Valve: The tricuspid valve is normal in structure. Tricuspid valve regurgitation is trivial. Aortic Valve: The aortic valve is tricuspid. Aortic valve regurgitation is not visualized. No  aortic stenosis is present. Aortic valve mean gradient measures 6.0 mmHg. Aortic valve peak gradient measures 9.7 mmHg. Aortic valve area, by VTI measures 2.32 cm. Pulmonic Valve: The pulmonic valve was normal in structure. Pulmonic valve regurgitation is not visualized. Aorta: The aortic root and ascending aorta are structurally normal, with no evidence of dilitation. Venous: The inferior vena cava is normal in size with greater than 50% respiratory variability, suggesting right atrial pressure of 3 mmHg. IAS/Shunts: No atrial level shunt detected by color flow Doppler.  LEFT VENTRICLE PLAX 2D LVIDd:         4.10 cm      Diastology LVIDs:  2.90 cm      LV e' medial:    8.81 cm/s LV PW:         1.20 cm      LV E/e' medial:  10.4 LV IVS:        1.10 cm      LV e' lateral:   9.57 cm/s LVOT diam:     2.10 cm      LV E/e' lateral: 9.5 LV SV:         59 LV SV Index:   29 LVOT Area:     3.46 cm  LV Volumes (MOD) LV vol d, MOD A2C: 105.0 ml LV vol d, MOD A4C: 107.0 ml LV vol s, MOD A2C: 40.0 ml LV vol s, MOD A4C: 39.1 ml LV SV MOD A2C:     65.0 ml LV SV MOD A4C:     107.0 ml LV SV MOD BP:      66.2 ml RIGHT VENTRICLE             IVC RV Basal diam:  3.50 cm     IVC diam: 1.00 cm RV S prime:     19.80 cm/s TAPSE (M-mode): 3.0 cm LEFT ATRIUM             Index       RIGHT ATRIUM           Index LA diam:        2.40 cm 1.17 cm/m  RA Area:     10.40 cm LA Vol (A2C):   60.7 ml 29.70 ml/m RA Volume:   20.50 ml  10.03 ml/m LA Vol (A4C):   53.4 ml 26.13 ml/m LA Biplane Vol: 59.6 ml 29.16 ml/m  AORTIC VALVE AV Area (Vmax):    2.89 cm AV Area (Vmean):   2.53 cm AV Area (VTI):     2.32 cm AV Vmax:           156.00 cm/s AV Vmean:          112.000 cm/s AV VTI:            0.255 m AV Peak Grad:      9.7 mmHg AV Mean Grad:      6.0 mmHg LVOT Vmax:         130.00 cm/s LVOT Vmean:        81.800 cm/s LVOT VTI:          0.171 m LVOT/AV VTI ratio: 0.67  AORTA Ao Root diam: 3.30 cm Ao Asc diam:  2.90 cm MITRAL VALVE MV Area  (PHT): 4.46 cm    SHUNTS MV Decel Time: 170 msec    Systemic VTI:  0.17 m MV E velocity: 91.20 cm/s  Systemic Diam: 2.10 cm MV A velocity: 96.00 cm/s MV E/A ratio:  0.95 Gwyndolyn Kaufman MD Electronically signed by Gwyndolyn Kaufman MD Signature Date/Time: 05/14/2020/4:48:00 PM    Final       Discharge Exam: Vitals:   05/14/20 2020 05/15/20 0859  BP: 120/60 126/86  Pulse: 66 89  Resp: 18   Temp: 98 F (36.7 C)   SpO2: 96%    Vitals:   05/14/20 1043 05/14/20 1910 05/14/20 2020 05/15/20 0859  BP: (!) 104/53  120/60 126/86  Pulse: 66  66 89  Resp: 16 18 18    Temp: 98 F (36.7 C)  98 F (36.7 C)   TempSrc: Oral  Oral   SpO2: 98%  96%   Weight:  Height:        General: Pt is alert, awake, not in acute distress Cardiovascular: RRR, S1/S2 +, no rubs, no gallops Respiratory: CTA bilaterally, no wheezing, no rhonchi Abdominal: Soft, NT, ND, bowel sounds + Extremities: no edema, no cyanosis    The results of significant diagnostics from this hospitalization (including imaging, microbiology, ancillary and laboratory) are listed below for reference.     Microbiology: Recent Results (from the past 240 hour(s))  SARS CORONAVIRUS 2 (TAT 6-24 HRS) Nasopharyngeal Nasopharyngeal Swab     Status: None   Collection Time: 05/14/20  2:22 AM   Specimen: Nasopharyngeal Swab  Result Value Ref Range Status   SARS Coronavirus 2 NEGATIVE NEGATIVE Final    Comment: (NOTE) SARS-CoV-2 target nucleic acids are NOT DETECTED.  The SARS-CoV-2 RNA is generally detectable in upper and lower respiratory specimens during the acute phase of infection. Negative results do not preclude SARS-CoV-2 infection, do not rule out co-infections with other pathogens, and should not be used as the sole basis for treatment or other patient management decisions. Negative results must be combined with clinical observations, patient history, and epidemiological information. The expected result is  Negative.  Fact Sheet for Patients: SugarRoll.be  Fact Sheet for Healthcare Providers: https://www.woods-mathews.com/  This test is not yet approved or cleared by the [place] FDA and  has been authorized for detection and/or diagnosis of SARS-CoV-2 by FDA under an Emergency Use Authorization (EUA). This EUA will remain  in effect (meaning this test can be used) for the duration of the COVID-19 declaration under Se ction 564(b)(1) of the Act, 21 U.S.C. section 360bbb-3(b)(1), unless the authorization is terminated or revoked sooner.  Performed at [facility], [place] [address] [place], [place] [address]      Labs: BNP (last 3 results) No results for input(s): BNP in the last 8760 hours. Basic Metabolic Panel: Recent Labs  Lab 05/13/20 2342 05/14/20 0325 05/15/20 0202  NA 142 140 138  K 2.9* 3.4* 3.5  CL 109 103 105  CO2 26 29 28   GLUCOSE 91 98 111*  BUN 11 12 12   CREATININE 1.02 1.07 1.04  CALCIUM 8.1* 9.0 8.8*  MG  --  2.0 2.1   Liver Function Tests: No results for input(s): AST, ALT, ALKPHOS, BILITOT, PROT, ALBUMIN in the last 168 hours. No results for input(s): LIPASE, AMYLASE in the last 168 hours. No results for input(s): AMMONIA in the last 168 hours. CBC: Recent Labs  Lab 05/13/20 2342 05/14/20 0325 05/15/20 0202  WBC 4.8 4.9 5.0  NEUTROABS 2.4  --   --   HGB 12.8* 13.7 13.9  HCT 38.0* 40.8 41.0  MCV 88.0 86.4 87.6  PLT 261 282 284   Cardiac Enzymes: No results for input(s): CKTOTAL, CKMB, CKMBINDEX, TROPONINI in the last 168 hours. BNP: Invalid input(s): POCBNP CBG: Recent Labs  Lab 05/13/20 2351  GLUCAP 99   D-Dimer No results for input(s): DDIMER in the last 72 hours. Hgb A1c No results for input(s): HGBA1C in the last 72 hours. Lipid Profile No results for input(s): CHOL, HDL, LDLCALC, TRIG, CHOLHDL, LDLDIRECT in the last 72 hours. Thyroid function studies No results for input(s):  TSH, T4TOTAL, T3FREE, THYROIDAB in the last 72 hours.  Invalid input(s): FREET3 Anemia work up No results for input(s): VITAMINB12, FOLATE, FERRITIN, TIBC, IRON, RETICCTPCT in the last 72 hours. Urinalysis No results found for: COLORURINE, APPEARANCEUR, LABSPEC, [place], GLUCOSEU, HGBUR, BILIRUBINUR, KETONESUR, PROTEINUR, UROBILINOGEN, NITRITE, LEUKOCYTESUR Sepsis Labs Invalid input(s):  PROCALCITONIN,  WBC,  LACTICIDVEN Microbiology Recent Results (from the past 240 hour(s))  SARS CORONAVIRUS 2 (TAT 6-24 HRS) Nasopharyngeal Nasopharyngeal Swab     Status: None   Collection Time: 05/14/20  2:22 AM   Specimen: Nasopharyngeal Swab  Result Value Ref Range Status   SARS Coronavirus 2 NEGATIVE NEGATIVE Final    Comment: (NOTE) SARS-CoV-2 target nucleic acids are NOT DETECTED.  The SARS-CoV-2 RNA is generally detectable in upper and lower respiratory specimens during the acute phase of infection. Negative results do not preclude SARS-CoV-2 infection, do not rule out co-infections with other pathogens, and should not be used as the sole basis for treatment or other patient management decisions. Negative results must be combined with clinical observations, patient history, and epidemiological information. The expected result is Negative.  Fact Sheet for Patients: SugarRoll.be  Fact Sheet for Healthcare Providers: https://www.woods-mathews.com/  This test is not yet approved or cleared by the [place] FDA and  has been authorized for detection and/or diagnosis of SARS-CoV-2 by FDA under an Emergency Use Authorization (EUA). This EUA will remain  in effect (meaning this test can be used) for the duration of the COVID-19 declaration under Se ction 564(b)(1) of the Act, 21 U.S.C. section 360bbb-3(b)(1), unless the authorization is terminated or revoked sooner.  Performed at [facility], [place] [address], [place], [place] [address]       Time coordinating discharge: Over 30 minutes  SIGNED:   Darliss Cheney, MD  [facility] 05/15/2020, 9:42 AM  If 7PM-7AM, please contact night-coverage [email or web address]

## 2020-05-15 NOTE — Plan of Care (Signed)

## 2020-05-15 NOTE — Plan of Care (Signed)
Discharge instruction given to the patient and his wife by Architect. Questions and concerns were addressed. VS checked and within normal limit. Monitor and IV discontinued. Patient discharged via wheelchair at 1102. Patient hemodynamically stable during discharge.  Problem: Education: Goal: Knowledge of General Education information will improve Description: Including pain rating scale, medication(s)/side effects and non-pharmacologic comfort measures 05/15/2020 1100 by Raymondo Band, RN Outcome: Completed/Met 05/15/2020 1100 by Raymondo Band, RN Outcome: Adequate for Discharge   Problem: Health Behavior/Discharge Planning: Goal: Ability to manage health-related needs will improve 05/15/2020 1100 by Raymondo Band, RN Outcome: Completed/Met 05/15/2020 1100 by Raymondo Band, RN Outcome: Adequate for Discharge   Problem: Clinical Measurements: Goal: Ability to maintain clinical measurements within normal limits will improve 05/15/2020 1100 by Raymondo Band, RN Outcome: Completed/Met 05/15/2020 1100 by Raymondo Band, RN Outcome: Adequate for Discharge Goal: Will remain free from infection 05/15/2020 1100 by Raymondo Band, RN Outcome: Completed/Met 05/15/2020 1100 by Raymondo Band, RN Outcome: Adequate for Discharge Goal: Diagnostic test results will improve 05/15/2020 1100 by Raymondo Band, RN Outcome: Completed/Met 05/15/2020 1100 by Raymondo Band, RN Outcome: Adequate for Discharge Goal: Respiratory complications will improve 05/15/2020 1100 by Raymondo Band, RN Outcome: Completed/Met 05/15/2020 1100 by Raymondo Band, RN Outcome: Adequate for Discharge Goal: Cardiovascular complication will be avoided 05/15/2020 1100 by Raymondo Band, RN Outcome: Completed/Met 05/15/2020 1100 by Raymondo Band, RN Outcome: Adequate for Discharge   Problem: Activity: Goal: Risk for activity intolerance will decrease 05/15/2020 1100 by Raymondo Band,  RN Outcome: Completed/Met 05/15/2020 1100 by Raymondo Band, RN Outcome: Adequate for Discharge   Problem: Nutrition: Goal: Adequate nutrition will be maintained 05/15/2020 1100 by Raymondo Band, RN Outcome: Completed/Met 05/15/2020 1100 by Raymondo Band, RN Outcome: Adequate for Discharge   Problem: Coping: Goal: Level of anxiety will decrease 05/15/2020 1100 by Raymondo Band, RN Outcome: Completed/Met 05/15/2020 1100 by Raymondo Band, RN Outcome: Adequate for Discharge   Problem: Elimination: Goal: Will not experience complications related to bowel motility 05/15/2020 1100 by Raymondo Band, RN Outcome: Completed/Met 05/15/2020 1100 by Raymondo Band, RN Outcome: Adequate for Discharge Goal: Will not experience complications related to urinary retention 05/15/2020 1100 by Raymondo Band, RN Outcome: Completed/Met 05/15/2020 1100 by Raymondo Band, RN Outcome: Adequate for Discharge   Problem: Pain Managment: Goal: General experience of comfort will improve 05/15/2020 1100 by Raymondo Band, RN Outcome: Completed/Met 05/15/2020 1100 by Raymondo Band, RN Outcome: Adequate for Discharge   Problem: Safety: Goal: Ability to remain free from injury will improve 05/15/2020 1100 by Raymondo Band, RN Outcome: Completed/Met 05/15/2020 1100 by Raymondo Band, RN Outcome: Adequate for Discharge   Problem: Skin Integrity: Goal: Risk for impaired skin integrity will decrease 05/15/2020 1100 by Raymondo Band, RN Outcome: Completed/Met 05/15/2020 1100 by Raymondo Band, RN Outcome: Adequate for Discharge

## 2020-05-15 NOTE — Discharge Instructions (Signed)
Near-Syncope Near-syncope is when you suddenly feel like you might pass out (faint), but you do not actually lose consciousness. This may also be referred to as presyncope. During an episode of near-syncope, you may:  Feel dizzy, weak, or light-headed.  Feel nauseous.  See all white or all black in your field of vision, or see spots.  Have cold, clammy skin. This condition is caused by a sudden decrease in blood flow to the brain. This decrease can result from various causes, but most of those causes are not dangerous. However, near-syncope may be a sign of a serious medical problem, so it is important to seek medical care. Follow these instructions at home: Medicines  Take over-the-counter and prescription medicines only as told by your health care provider.  If you are taking blood pressure or heart medicine, get up slowly and take several minutes to sit and then stand. This can reduce dizziness. General instructions  Pay attention to any changes in your symptoms.  Talk with your health care provider about your symptoms. You may need to have testing to understand the cause of your near-syncope.  If you start to feel like you might faint, lie down right away and raise (elevate) your feet above the level of your heart. Breathe deeply and steadily. Wait until all of the symptoms have passed.  Have someone stay with you until you feel stable.  Do not drive, use machinery, or play sports until your health care provider says it is okay.  Drink enough fluid to keep your urine pale yellow.  Keep all follow-up visits as told by your health care provider. This is important. Get help right away if you:  Have a seizure.  Have unusual pain in your chest, abdomen, or back.  Faint once or repeatedly.  Have a severe headache.  Are bleeding from your mouth or rectum, or you have black or tarry stool.  Have a very fast or irregular heartbeat (palpitations).  Are confused.  Have  trouble walking.  Have severe weakness.  Have vision problems. These symptoms may represent a serious problem that is an emergency. Do not wait to see if your symptoms will go away. Get medical help right away. Call your local emergency services (911 in the [place] Do not drive yourself to the hospital. Summary  Near-syncope is when you suddenly feel like you might pass out (faint), but you do not actually lose consciousness.  This condition is caused by a sudden decrease in blood flow to the brain. This decrease can result from various causes, but most of those causes are not dangerous.  Near-syncope may be a sign of a serious medical problem, so it is important to seek medical care. This information is not intended to replace advice given to you by your health care provider. Make sure you discuss any questions you have with your health care provider. Document Revised: 05/12/2018 Document Reviewed: 12/07/2017 Elsevier Patient Education  2021 [employer].

## 2020-05-16 ENCOUNTER — Ambulatory Visit (HOSPITAL_COMMUNITY): Admission: RE | Admit: 2020-05-16 | Payer: 59 | Source: Ambulatory Visit

## 2020-05-26 ENCOUNTER — Encounter (HOSPITAL_COMMUNITY): Payer: Self-pay

## 2020-05-26 ENCOUNTER — Emergency Department (HOSPITAL_COMMUNITY)
Admission: EM | Admit: 2020-05-26 | Discharge: 2020-05-26 | Disposition: A | Discharge status: Home / Self Care | Payer: 59 | Attending: Emergency Medicine | Admitting: Emergency Medicine

## 2020-05-26 ENCOUNTER — Other Ambulatory Visit: Payer: Self-pay

## 2020-05-26 DIAGNOSIS — I2510 Atherosclerotic heart disease of native coronary artery without angina pectoris: Secondary | ICD-10-CM | POA: Insufficient documentation

## 2020-05-26 DIAGNOSIS — Z7982 Long term (current) use of aspirin: Secondary | ICD-10-CM | POA: Diagnosis not present

## 2020-05-26 DIAGNOSIS — R569 Unspecified convulsions: Secondary | ICD-10-CM

## 2020-05-26 DIAGNOSIS — Z79899 Other long term (current) drug therapy: Secondary | ICD-10-CM | POA: Insufficient documentation

## 2020-05-26 DIAGNOSIS — E876 Hypokalemia: Secondary | ICD-10-CM

## 2020-05-26 DIAGNOSIS — I10 Essential (primary) hypertension: Secondary | ICD-10-CM | POA: Diagnosis not present

## 2020-05-26 LAB — CBC WITH DIFFERENTIAL/PLATELET
Abs Immature Granulocytes: 0.02 10*3/uL (ref 0.00–0.07)
Basophils Absolute: 0.1 10*3/uL (ref 0.0–0.1)
Basophils Relative: 1 %
Eosinophils Absolute: 0.2 10*3/uL (ref 0.0–0.5)
Eosinophils Relative: 5 %
HCT: 39.5 % (ref 39.0–52.0)
Hemoglobin: 13.3 g/dL (ref 13.0–17.0)
Immature Granulocytes: 1 %
Lymphocytes Relative: 40 %
Lymphs Abs: 1.7 10*3/uL (ref 0.7–4.0)
MCH: 29.7 pg (ref 26.0–34.0)
MCHC: 33.7 g/dL (ref 30.0–36.0)
MCV: 88.2 fL (ref 80.0–100.0)
Monocytes Absolute: 0.4 10*3/uL (ref 0.1–1.0)
Monocytes Relative: 9 %
Neutro Abs: 1.9 10*3/uL (ref 1.7–7.7)
Neutrophils Relative %: 44 %
Platelets: 272 10*3/uL (ref 150–400)
RBC: 4.48 MIL/uL (ref 4.22–5.81)
RDW: 13.3 % (ref 11.5–15.5)
WBC: 4.2 10*3/uL (ref 4.0–10.5)
nRBC: 0 % (ref 0.0–0.2)

## 2020-05-26 LAB — COMPREHENSIVE METABOLIC PANEL
ALT: 25 U/L (ref 0–44)
AST: 26 U/L (ref 15–41)
Albumin: 3.7 g/dL (ref 3.5–5.0)
Alkaline Phosphatase: 76 U/L (ref 38–126)
Anion gap: 7 (ref 5–15)
BUN: 12 mg/dL (ref 6–20)
CO2: 29 mmol/L (ref 22–32)
Calcium: 8.9 mg/dL (ref 8.9–10.3)
Chloride: 103 mmol/L (ref 98–111)
Creatinine, Ser: 1.06 mg/dL (ref 0.61–1.24)
GFR, Estimated: >60 mL/min (ref >60)
Glucose, Bld: 112 mg/dL — ABNORMAL HIGH (ref 70–99)
Potassium: 3.2 mmol/L — ABNORMAL LOW (ref 3.5–5.1)
Sodium: 139 mmol/L (ref 135–145)
Total Bilirubin: 0.8 mg/dL (ref 0.3–1.2)
Total Protein: 6 g/dL — ABNORMAL LOW (ref 6.5–8.1)

## 2020-05-26 LAB — RAPID URINE DRUG SCREEN, HOSP PERFORMED
Amphetamines: NOT DETECTED
Barbiturates: NOT DETECTED
Benzodiazepines: POSITIVE — AB
Cocaine: NOT DETECTED
Opiates: NOT DETECTED
Tetrahydrocannabinol: NOT DETECTED

## 2020-05-26 LAB — MAGNESIUM: Magnesium: 2.1 mg/dL (ref 1.7–2.4)

## 2020-05-26 LAB — CBG MONITORING, ED: Glucose-Capillary: 107 mg/dL — ABNORMAL HIGH (ref 70–99)

## 2020-05-26 LAB — ETHANOL: Alcohol, Ethyl (B): <10 mg/dL (ref <10)

## 2020-05-26 MED ORDER — SODIUM CHLORIDE 0.9 % IV SOLN
2000.0000 mg | Freq: Once | INTRAVENOUS | Status: AC
  Administered 2020-05-26: 2000 mg via INTRAVENOUS
  Filled 2020-05-26: qty 20

## 2020-05-26 MED ORDER — POTASSIUM CHLORIDE 10 MEQ/100ML IV SOLN
10.0000 meq | INTRAVENOUS | Status: AC
  Administered 2020-05-26 (×2): 10 meq via INTRAVENOUS
  Filled 2020-05-26 (×2): qty 100

## 2020-05-26 MED ORDER — LEVETIRACETAM 500 MG PO TABS: 500.0000 mg | ORAL_TABLET | Freq: Two times a day (BID) | ORAL | 1 refills | Status: DC

## 2020-05-26 MED ORDER — LORAZEPAM 2 MG/ML IJ SOLN
INTRAMUSCULAR | Status: AC
  Filled 2020-05-26: qty 1

## 2020-05-26 MED ORDER — LORAZEPAM 2 MG/ML IJ SOLN: 1.0000 mg | Freq: Once | INTRAMUSCULAR | Status: DC

## 2020-05-26 MED ORDER — POTASSIUM CHLORIDE CRYS ER 20 MEQ PO TBCR
40.0000 meq | EXTENDED_RELEASE_TABLET | Freq: Once | ORAL | Status: AC
  Administered 2020-05-26: 40 meq via ORAL
  Filled 2020-05-26: qty 2

## 2020-05-26 NOTE — ED Triage Notes (Signed)
Brought in by family - c/o seizure like activity at home. Pt was at home sleeping, woke up and felt nauseous and then had a seizure.     Not on any seizure med Reports lost bladder control.  GCS15 in triage.

## 2020-05-26 NOTE — Discharge Instructions (Signed)
Please begin taking Keppra 500 mg twice. Follow up with the neurologist in two days. Do not drive or do other things that would potentially be dangerous.   Increase potassium intake in your diet.

## 2020-05-26 NOTE — ED Provider Notes (Signed)
[facility] EMERGENCY DEPARTMENT Provider Note   CSN: [id number] Arrival date & time: 05/26/20  0250     History Chief Complaint  Patient presents with  . Seizures    Mathew Moore is a 55 y.o. male.   55 y.o. male with medical history significant for sarcoidosis, anxiety, CAD, hypertension presents to the ED for evaluation after seizure-like activity at home.  Was sleeping when he was awoken by a sensation of profound nausea.  Got out of bed and was on his way to the bathroom when he ended up on the floor and was visualized having rhythmic shaking and disorientation.  Wife is at bedside and provides video of what occurred at home.  Symptoms lasted for a few minutes before spontaneously resolving.  Patient was incontinent of urine during this episode.  He complains of feeling tired at this time.   On chart review, extensive medical work-up recently for various episodes of syncope versus seizure-like episodes.  Had normal EEG on 05/14/20 as well as normal MRI brain and MRA head/neck.  Assessed in the ED 1 week prior with negative serial troponin levels, CTA negative for pulmonary embolus.  Has outpatient follow up with Neurology tomorrow.  The history is provided by the patient and the spouse. No language interpreter was used.  Seizures      Past Medical History:  Diagnosis Date  . Anxiety    ocassional  . Atypical mole 08/24/2019   mild- right lower leg - anterior  . GERD (gastroesophageal reflux disease)   . Hypertension   . Sarcoidosis 07/2019    Patient Active Problem List   Diagnosis Date Noted  . Syncope 05/14/2020  . Hypokalemia 05/14/2020  . Anxiety   . Seizure-like activity ([place])   . Sarcoidosis 08/10/2019  . CAD (coronary artery disease) 07/2019  . 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 Lymphadenopathy, mediastinal 06/11/2019  . Abnormal stress test   . Chest pain     Past Surgical History:  Procedure Laterality Date  . BRONCHOSCOPY    . CLEFT  LIP REPAIR    . COLONOSCOPY    . HERNIA REPAIR Left   . LEFT HEART CATH AND CORONARY ANGIOGRAPHY N/A 08/23/2018   Procedure: LEFT HEART CATH AND CORONARY ANGIOGRAPHY;  Surgeon: Burnell Blanks, MD;  Location: [facility];  Service: Cardiovascular;  Laterality: N/A;  . MEDIASTINOSCOPY N/A 07/20/2019   Procedure: MEDIASTINOSCOPY;  Surgeon: Melrose Nakayama, MD;  Location: [facility];  Service: Thoracic;  Laterality: N/A;  . NASAL SEPTUM SURGERY    . PLANTAR FASCIA SURGERY Right   . VIDEO BRONCHOSCOPY WITH ENDOBRONCHIAL ULTRASOUND N/A 07/06/2019   Procedure: VIDEO BRONCHOSCOPY WITH ENDOBRONCHIAL ULTRASOUND;  Surgeon: Marshell Garfinkel, MD;  Location: [facility];  Service: Pulmonary;  Laterality: N/A;       Family History  Problem Relation Age of Onset  . Breast cancer Mother   . Heart disease Father   . Lung cancer Father     Social History   Tobacco Use  . Smoking status: Never Smoker  . Smokeless tobacco: Never Used  Vaping Use  . Vaping Use: Never used  Substance Use Topics  . Alcohol use: Not Currently  . Drug use: Not Currently    Home Medications Prior to Admission medications   Medication Sig Start Date End Date Taking? Authorizing Provider  acetaminophen (TYLENOL) 500 MG tablet Take 1,000 mg by mouth every 6 (six) hours as needed for moderate pain or headache.  [provider]  albuterol (VENTOLIN HFA) 108 (90 Base) MCG/ACT inhaler Inhale 1-2 puffs into the lungs every 6 (six) hours as needed for wheezing or shortness of breath. 09/11/19   Martyn Ehrich, NP  aspirin EC 81 MG tablet Take 81 mg by mouth daily. Swallow whole.    [provider]  atorvastatin (LIPITOR) 40 MG tablet Take 40 mg by mouth daily.    [provider]  Cholecalciferol (VITAMIN D) 50 MCG (2000 UT) CAPS Take 2,000 Units by mouth daily.    [provider]  citalopram (CELEXA) 20 MG tablet Take 20 mg by mouth daily.    [provider]  clonazePAM  (KLONOPIN) 0.5 MG tablet Take 0.5 mg by mouth 3 (three) times daily as needed for anxiety.    [provider]  indapamide (LOZOL) 1.25 MG tablet Take 1.25 mg by mouth daily. 04/09/20   [provider]  omeprazole (PRILOSEC) 40 MG capsule Take 1 capsule (40 mg total) by mouth in the morning and at bedtime. 11/20/19   Spero Geralds, MD  testosterone cypionate (DEPOTESTOSTERONE CYPIONATE) 200 MG/ML injection Inject 200 mg into the muscle every 30 (thirty) days. 05/01/20   [provider]  zolpidem (AMBIEN) 10 MG tablet Take 10 mg by mouth at bedtime.  05/01/19   [provider]    Allergies    Patient has no known allergies.  Review of Systems   Review of Systems  Neurological: Positive for seizures.  Ten systems reviewed and are negative for acute change, except as noted in the HPI.    Physical Exam Updated Vital Signs BP 123/80   Pulse 60   Temp 98 F (36.7 C) (Oral)   Resp 20   Ht 5\' 7"  (1.702 m)   Wt 93 kg   SpO2 99%   BMI 32.11 kg/m   Physical Exam Vitals and nursing note reviewed.  Constitutional:      General: He is not in acute distress.    Appearance: He is well-developed. He is not diaphoretic.     Comments: Nontoxic appearing and in NAD  HENT:     Head: Normocephalic and atraumatic.     Mouth/Throat:     Mouth: Mucous membranes are moist.     Comments: No oral trauma. Eyes:     General: No scleral icterus.    Extraocular Movements: Extraocular movements intact.     Conjunctiva/sclera: Conjunctivae normal.  Cardiovascular:     Rate and Rhythm: Normal rate and regular rhythm.     Pulses: Normal pulses.  Pulmonary:     Effort: Pulmonary effort is normal. No respiratory distress.     Breath sounds: No stridor. No wheezing.     Comments: Respirations even and unlabored Musculoskeletal:        General: Normal range of motion.     Cervical back: Normal range of motion.  Skin:    General: Skin is warm and dry.     Coloration:  Skin is not pale.     Findings: No erythema or rash.  Neurological:     Mental Status: He is alert and oriented to person, place, and time.     Motor: No weakness.     Coordination: Coordination normal.     Comments: GCS 15.  Speech is clear, goal oriented.  Patient without any focal deficits.  He moves all extremities spontaneously.  Psychiatric:        Behavior: Behavior normal.     ED Results /  Procedures / Treatments   Labs (all labs ordered are listed, but only abnormal results are displayed) Labs Reviewed  CBG MONITORING, ED - Abnormal; Notable for the following components:      Result Value   Glucose-Capillary 107 (*)    All other components within normal limits  CBC WITH DIFFERENTIAL/PLATELET  COMPREHENSIVE METABOLIC PANEL  MAGNESIUM  ETHANOL  RAPID URINE DRUG SCREEN, HOSP PERFORMED    EKG None  Radiology No results found.  Procedures Procedures   Medications Ordered in ED Medications  levETIRAcetam (KEPPRA) 2,000 mg in sodium chloride 0.9 % 250 mL IVPB (has no administration in time range)  LORazepam (ATIVAN) injection 1 mg ( Intravenous Not Given 05/26/20 0541)    ED Course  I have reviewed the triage vital signs and the nursing notes.  Pertinent labs & imaging results that were available during my care of the patient were reviewed by me and considered in my medical decision making (see chart for details).  Clinical Course as of 05/26/20 0545  Mon May 26, 2020  [facility] paged to discuss potential initiation of AEDs. [KH]  0454 Case discussed with Dr. Cheral Marker of neurology.  He recommends load of 2000 mg Keppra. If labs reassuring, advises d/c with Rx for 500 mg Keppra twice daily pending outpatient neurology follow-up. [UJ]  8119 Called to patient's room for reported seizure-like activity.  Upon entering the room, patient with closed eyes, arms at side - shaking side to side in the bed with twitching of his R leg. No change in vital signs; persistently  normal heart rate, O2 sats, respirations. Episode ceased spontaneously after <1 minute. No tongue biting or incontinence. Alert immediately post episode without post-ictal state. Question pseudoseizure. Will proceed with IV Keppra load. Patient to require observation after medications received. If additional seizure-like activity despite being given IV Keppra, would admit.  [KH]    Clinical Course User Index [KH] Beverely Pace   MDM Rules/Calculators/A&P                          55 year old male presents to the emergency department for evaluation of seizure-like episode.  He was recently admitted for similar presentation with reassuring inpatient MRI, MRA, EEG, TTE.  Neurologically at baseline on arrival without any focal deficits.  He was noted to have 1 episode of snoring, shaking of bilateral arms, twitching of right leg.  This lasted for less than 1 minute.  Patient was not postictal when returning to baseline.  Neurology was consulted and recommended loading dose of 2 g IV Keppra with initiation of Keppra 500 twice daily.  He has scheduled follow-up with neurology tomorrow.  Pending laboratory evaluation and ongoing observation.  If no recurrent seizure-like activity, patient stable for discharge and neurologic follow-up as scheduled.  Care signed out to [place], [place] at change of shift.   Final Clinical Impression(s) / ED Diagnoses Final diagnoses:  Seizure-like activity ([place])  Hypokalemia    Rx / DC Orders ED Discharge Orders    None       Antonietta Breach, PA-C 05/28/20 2228    Veryl Speak, MD 05/30/20 [phone]

## 2020-05-26 NOTE — ED Notes (Signed)
This RN was called by wife to patient's bedside for seizure-like activity. Patient holding his breath and twitching in R leg. Patient's vitals signs remained unchanged throughout "episode". Patient immediately resolved and was not postictal after event.

## 2020-05-26 NOTE — ED Provider Notes (Signed)
Care assumed from [name], please see her note for full details, but in brief Mathew Moore is a 55 y.o. male presents from home with seizure-like activity has been evaluated for this multiple times recently, had initially not been started on any seizure medications.  Had 1 seizure-like episode at home and another here in the emergency department.  Had episode of jerking, but afterwards was completely alert with no clear postictal period and no alteration in vitals during episode.  His work-up today has been reassuring.  Given additional seizure episodes, neurology was consulted, they recommended starting patient on Keppra, he was given a loading dose here in the ED, will need to be observed while Keppra finishes, as long as he has no further seizure-like episodes feel he can likely be discharged home.  He is already scheduled to follow-up with neurology tomorrow.  BP 119/81   Pulse (!) 55   Temp 98 F (36.7 C) (Oral)   Resp 17   Ht 5\' 7"  (1.702 m)   Wt 93 kg   SpO2 98%   BMI 32.11 kg/m    ED Course/Procedures   Labs Reviewed  COMPREHENSIVE METABOLIC PANEL - Abnormal; Notable for the following components:      Result Value   Potassium 3.2 (*)    Glucose, Bld 112 (*)    Total Protein 6.0 (*)    All other components within normal limits  CBG MONITORING, ED - Abnormal; Notable for the following components:   Glucose-Capillary 107 (*)    All other components within normal limits  CBC WITH DIFFERENTIAL/PLATELET  MAGNESIUM  ETHANOL  RAPID URINE DRUG SCREEN, HOSP PERFORMED    Clinical Course as of 05/26/20 0811  Mon May 26, 2020  [facility] paged to discuss potential initiation of AEDs. [KH]  3818 Case discussed with Dr. Cheral Marker of neurology.  He recommends load of 2000 mg Keppra. If labs reassuring, advises d/c with Rx for 500 mg Keppra twice daily pending outpatient neurology follow-up. [EX]  9371 Called to patient's room for reported seizure-like activity.  Upon  entering the room, patient with closed eyes, arms at side - shaking side to side in the bed with twitching of his R leg. No change in vital signs; persistently normal heart rate, O2 sats, respirations. Episode ceased spontaneously after <1 minute. No tongue biting or incontinence. Alert immediately post episode without post-ictal state. Question pseudoseizure. Will proceed with IV Keppra load. Patient to require observation after medications received. If additional seizure-like activity despite being given IV Keppra, would admit.  [KH]    Clinical Course User Index [KH] Antonietta Breach, PA-C    Procedures  MDM   Patient received Keppra here in the ED and since then has had no further seizure-like activity and has been at baseline, currently comfortable and sleeping.  Patient prescribed Keppra 500 mg twice daily.  Had long discussion with wife about seizure precautions, do not feel that patient will require admission since he is already recently had admission with MRI imaging of the brain as well as EEG.  They will follow-up with neurology tomorrow as planned.  They expressed understanding and agreement with this plan.  Discharged home in good condition.     Jacqlyn Larsen, PA-C 05/26/20 6967    Veryl Speak, MD 05/30/20 [phone]

## 2020-05-27 ENCOUNTER — Encounter: Payer: Self-pay | Admitting: Diagnostic Neuroimaging

## 2020-05-27 ENCOUNTER — Telehealth: Payer: Self-pay

## 2020-05-27 ENCOUNTER — Ambulatory Visit: Payer: 59 | Admitting: Diagnostic Neuroimaging

## 2020-05-27 VITALS — BP 122/77 | HR 60 | Ht 67.0 in | Wt 210.4 lb

## 2020-05-27 DIAGNOSIS — R55 Syncope and collapse: Secondary | ICD-10-CM

## 2020-05-27 DIAGNOSIS — R569 Unspecified convulsions: Secondary | ICD-10-CM | POA: Diagnosis not present

## 2020-05-27 MED ORDER — LEVETIRACETAM 500 MG PO TABS: 500.0000 mg | ORAL_TABLET | Freq: Two times a day (BID) | ORAL | 4 refills | Status: DC

## 2020-05-27 NOTE — Progress Notes (Signed)
[facility]  PATIENT: Mathew Moore DOB: 23-Apr-1965  REFERRING CLINICIAN: Sasser, Silvestre Moment, MD HISTORY FROM: patient  REASON FOR VISIT: new consult    HISTORICAL  CHIEF COMPLAINT:  Chief Complaint  Patient presents with  . TIA vs Migraine    Rm 7 New Pt wife- [name]  "several episodes of seizure-like activity since 05/06/20; feel very tired, disoriented afterwards"     HISTORY OF PRESENT ILLNESS:   55 year old male with pulmonary sarcoidosis, anxiety, coronary artery disease, hypertension, here for evaluation of syncope or seizure.  05/07/2020 patient was at work when he collapsed and passed out.  He woke up with chest pain and went to the emergency room.  Cardiac and pulmonary etiologies were ruled out.  Patient followed up with cardiology and no specific etiology was found.  Patient returned to ER on 05/13/2020 after having another event at home with palpitations, loss of consciousness and then seizure-like activity.  Patient was admitted for evaluation.  MRI and EEG were unremarkable.  Patient had another event and 05/21/2020 but did not go to the hospital.  He had a fourth event on 05/26/2020 at home.  This 1 was recorded by patient's wife.  I reviewed the video and patient is laying prone on the floor with waxing and waning tapping movements of his bilateral upper extremities, moaning sounds, heavy breathing.  Patient was in the hospital for evaluation.  He had a second event witnessed in the emergency room with convulsions, normal vital signs, lack of postictal confusion.  He was started on antiseizure medication levetiracetam.     REVIEW OF SYSTEMS: Full 14 system review of systems performed and negative with exception of: As per HPI.  ALLERGIES: No Known Allergies  HOME MEDICATIONS: Outpatient Medications Prior to Visit  Medication Sig Dispense Refill  . acetaminophen (TYLENOL) 500 MG tablet Take 1,000 mg by mouth every 6 (six) hours as needed for moderate  pain or headache.     . albuterol (VENTOLIN HFA) 108 (90 Base) MCG/ACT inhaler Inhale 1-2 puffs into the lungs every 6 (six) hours as needed for wheezing or shortness of breath. 18 g 1  . aspirin EC 81 MG tablet Take 81 mg by mouth daily. Swallow whole.    Marland Kitchen atorvastatin (LIPITOR) 40 MG tablet Take 40 mg by mouth daily.    . Cholecalciferol (VITAMIN D) 50 MCG (2000 UT) CAPS Take 2,000 Units by mouth daily.    . citalopram (CELEXA) 20 MG tablet Take 20 mg by mouth daily.    . clonazePAM (KLONOPIN) 0.5 MG tablet Take 0.5 mg by mouth 3 (three) times daily as needed for anxiety.    . indapamide (LOZOL) 1.25 MG tablet Take 1.25 mg by mouth daily.    Marland Kitchen losartan (COZAAR) 25 MG tablet Take 25 mg by mouth daily.    Marland Kitchen omeprazole (PRILOSEC) 40 MG capsule Take 1 capsule (40 mg total) by mouth in the morning and at bedtime. 60 capsule 11  . testosterone cypionate (DEPOTESTOSTERONE CYPIONATE) 200 MG/ML injection Inject 200 mg into the muscle every 30 (thirty) days.    Marland Kitchen zolpidem (AMBIEN) 10 MG tablet Take 10 mg by mouth at bedtime.     . levETIRAcetam (KEPPRA) 500 MG tablet Take 1 tablet (500 mg total) by mouth 2 (two) times daily. 60 tablet 1   Facility-Administered Medications Prior to Visit  Medication Dose Route Frequency Provider Last Rate Last Admin  . sodium chloride flush (NS) 0.9 % injection 3 mL  3 mL  Intravenous Q12H Josue Hector, MD        PAST MEDICAL HISTORY: Past Medical History:  Diagnosis Date  . Anxiety    ocassional  . Atypical mole 08/24/2019   mild- right lower leg - anterior  . GERD (gastroesophageal reflux disease)   . Hypertension   . Sarcoidosis 07/2019    PAST SURGICAL HISTORY: Past Surgical History:  Procedure Laterality Date  . BRONCHOSCOPY    . CLEFT LIP REPAIR    . COLONOSCOPY    . HERNIA REPAIR Left   . LEFT HEART CATH AND CORONARY ANGIOGRAPHY N/A 08/23/2018   Procedure: LEFT HEART CATH AND CORONARY ANGIOGRAPHY;  Surgeon: Burnell Blanks, MD;   Location: [place] [facility];  Service: Cardiovascular;  Laterality: N/A;  . MEDIASTINOSCOPY N/A 07/20/2019   Procedure: MEDIASTINOSCOPY;  Surgeon: Melrose Nakayama, MD;  Location: [place];  Service: Thoracic;  Laterality: N/A;  . NASAL SEPTUM SURGERY    . PLANTAR FASCIA SURGERY Right   . VIDEO BRONCHOSCOPY WITH ENDOBRONCHIAL ULTRASOUND N/A 07/06/2019   Procedure: VIDEO BRONCHOSCOPY WITH ENDOBRONCHIAL ULTRASOUND;  Surgeon: Marshell Garfinkel, MD;  Location: [place];  Service: Pulmonary;  Laterality: N/A;    FAMILY HISTORY: Family History  Problem Relation Age of Onset  . Breast cancer Mother   . Heart disease Father   . Lung cancer Father   . Heart attack Father     SOCIAL HISTORY: Social History   Socioeconomic History  . Marital status: Married    Spouse name: [name]  . Number of children: 2  . Years of education: Not on file  . Highest education level: Not on file  Occupational History  . Not on file  Tobacco Use  . Smoking status: Never Smoker  . Smokeless tobacco: Never Used  Vaping Use  . Vaping Use: Never used  Substance and Sexual Activity  . Alcohol use: Not Currently  . Drug use: Not Currently  . Sexual activity: Not on file  Other Topics Concern  . Not on file  Social History Narrative   Lives with wife   Social Determinants of Health   Financial Resource Strain: Low Risk   . Difficulty of Paying Living Expenses: Not hard at all  Food Insecurity: No Food Insecurity  . Worried About Charity fundraiser in the Last Year: Never true  . Ran Out of Food in the Last Year: Never true  Transportation Needs: No Transportation Needs  . Lack of Transportation (Medical): No  . Lack of Transportation (Non-Medical): No  Physical Activity: Sufficiently Active  . Days of Exercise per Week: 4 days  . Minutes of Exercise per Session: 40 min  Stress: No Stress Concern Present  . Feeling of Stress : Not at all  Social Connections: Moderately Integrated  . Frequency of  Communication with Friends and Family: Three times a week  . Frequency of Social Gatherings with Friends and Family: Three times a week  . Attends Religious Services: More than 4 times per year  . Active Member of Clubs or Organizations: No  . Attends Archivist Meetings: Never  . Marital Status: Married  Human resources officer Violence: Not At Risk  . Fear of Current or Ex-Partner: No  . Emotionally Abused: No  . Physically Abused: No  . Sexually Abused: No     PHYSICAL EXAM  GENERAL EXAM/CONSTITUTIONAL: Vitals:  Vitals:   05/27/20 1511  BP: 122/77  Pulse: 60  Weight: 210 lb 6.4 oz (95.4 kg)  Height:  5\' 7"  (1.702 m)   Body mass index is 32.95 kg/m. Wt Readings from Last 3 Encounters:  05/27/20 210 lb 6.4 oz (95.4 kg)  05/26/20 205 lb (93 kg)  05/13/20 205 lb (93 kg)    Patient is in no distress; well developed, nourished and groomed; neck is supple  CARDIOVASCULAR:  Examination of carotid arteries is normal; no carotid bruits  Regular rate and rhythm, no murmurs  Examination of peripheral vascular system by observation and palpation is normal  EYES:  Ophthalmoscopic exam of optic discs and posterior segments is normal; no papilledema or hemorrhages No exam data present  MUSCULOSKELETAL:  Gait, strength, tone, movements noted in Neurologic exam below  NEUROLOGIC: MENTAL STATUS:  No flowsheet data found.  awake, alert, oriented to person, place and time  recent and remote memory intact  normal attention and concentration  language fluent, comprehension intact, naming intact  fund of knowledge appropriate  CRANIAL NERVE:   2nd - no papilledema on fundoscopic exam  2nd, 3rd, 4th, 6th - pupils equal and reactive to light, visual fields full to confrontation, extraocular muscles intact, no nystagmus  5th - facial sensation symmetric  7th - facial strength symmetric  8th - hearing intact  9th - palate elevates symmetrically, uvula  midline  11th - shoulder shrug symmetric  12th - tongue protrusion midline  MOTOR:   normal bulk and tone, full strength in the BUE, BLE  SENSORY:   normal and symmetric to light touch, temperature, vibration  COORDINATION:   finger-nose-finger, fine finger movements normal  REFLEXES:   deep tendon reflexes present and symmetric  GAIT/STATION:   narrow based gait     DIAGNOSTIC DATA (LABS, IMAGING, TESTING) - I reviewed patient records, labs, notes, testing and imaging myself where available.  Lab Results  Component Value Date   WBC 4.2 05/26/2020   HGB 13.3 05/26/2020   HCT 39.5 05/26/2020   MCV 88.2 05/26/2020   PLT 272 05/26/2020      Component Value Date/Time   NA 139 05/26/2020 0512   K 3.2 (L) 05/26/2020 0512   CL 103 05/26/2020 0512   CO2 29 05/26/2020 0512   GLUCOSE 112 (H) 05/26/2020 0512   BUN 12 05/26/2020 0512   CREATININE 1.06 05/26/2020 0512   CALCIUM 8.9 05/26/2020 0512   PROT 6.0 (L) 05/26/2020 0512   ALBUMIN 3.7 05/26/2020 0512   AST 26 05/26/2020 0512   ALT 25 05/26/2020 0512   ALKPHOS 76 05/26/2020 0512   BILITOT 0.8 05/26/2020 0512   GFRNONAA >60 05/26/2020 0512   GFRAA >60 07/20/2019 0716   No results found for: CHOL, HDL, LDLCALC, LDLDIRECT, TRIG, CHOLHDL No results found for: HGBA1C No results found for: VITAMINB12 No results found for: TSH   05/14/20 MRI brain 1. Normal for age MRI appearance of the brain. No acute intracranial abnormality. 2. Moderate left maxillary sinus disease.    ASSESSMENT AND PLAN  55 y.o. year old male here with 4 episodes of loss of consciousness, some with prodromal presyncopal sensations, some with convulsions, with no specific triggering factors.  Differential diagnose include convulsive syncope versus epileptic seizures.  Also had 2 episodes yesterday which were witnessed by wife and emergency room staff with features for nonepileptic spells (including the video which I reviewed from  patient's wife).   Dx:  1. Syncope, unspecified syncope type   2. Seizures ([place])       PLAN:  SYNCOPE vs SEIZURE (+/- pseudosz)  - continue levetiracetam 500mg  twice  a day  - According to [place] law, you can not drive unless you are seizure / syncope free for at least 6 months and under physician's care.   - Please maintain precautions. Do not participate in activities where a loss of awareness could harm you or someone else. No swimming alone, no tub bathing, no hot tubs, no driving, no operating motorized vehicles (cars, ATVs, motocycles, etc), lawnmowers, power tools or firearms. No standing at heights, such as rooftops, ladders or stairs. Avoid hot objects such as stoves, heaters, open fires. Wear a helmet when riding a bicycle, scooter, skateboard, etc. and avoid areas of traffic. Set your water heater to 120 degrees or less.   Meds ordered this encounter  Medications  . levETIRAcetam (KEPPRA) 500 MG tablet    Sig: Take 1 tablet (500 mg total) by mouth 2 (two) times daily.    Dispense:  180 tablet    Refill:  4   Return in about 4 months (around 09/26/2020).    Penni Bombard, MD [id number], [id number] PM Certified in Neurology, Neurophysiology and Neuroimaging  [facility] [address] [place], [place] [address] [phone]

## 2020-05-27 NOTE — Telephone Encounter (Signed)
-----   Message from Erma Heritage, [place] sent at 05/27/2020  7:34 AM EDT ----- Please let the patient know his heart monitor showed predominantly normal sinus rhythm with only a brief episode of tachycardia and occasional extra beats from the top and bottom chambers of the heart but no significant arrhythmias to account for his prior syncope. Would recommend keeping scheduled follow-up with Neurology for further evaluation.

## 2020-05-27 NOTE — Patient Instructions (Signed)
SYNCOPE vs SEIZURE (+/- nonepileptic spells)  - continue levetiracetam 500mg  twice a day  - According to [place] law, you can not drive unless you are seizure / syncope free for at least 6 months and under physician's care.   - Please maintain precautions. Do not participate in activities where a loss of awareness could harm you or someone else. No swimming alone, no tub bathing, no hot tubs, no driving, no operating motorized vehicles (cars, ATVs, motocycles, etc), lawnmowers, power tools or firearms. No standing at heights, such as rooftops, ladders or stairs. Avoid hot objects such as stoves, heaters, open fires. Wear a helmet when riding a bicycle, scooter, skateboard, etc. and avoid areas of traffic. Set your water heater to 120 degrees or less.

## 2020-05-27 NOTE — Telephone Encounter (Signed)
Patient verbalized understanding and had no questions or concerns at this point. Patient verbalized agreement to keep scheduled f/u appointment with Neurology.

## 2020-05-29 NOTE — Telephone Encounter (Signed)
I called the patient's wife on DPR and provided her with Dr. Gladstone Lighter response.

## 2020-05-30 ENCOUNTER — Inpatient Hospital Stay (HOSPITAL_COMMUNITY)
Admission: EM | Admit: 2020-05-30 | Discharge: 2020-06-02 | DRG: 101 | Disposition: A | Discharge status: Home / Self Care | Payer: 59 | Attending: Internal Medicine | Admitting: Internal Medicine

## 2020-05-30 ENCOUNTER — Other Ambulatory Visit: Payer: Self-pay

## 2020-05-30 ENCOUNTER — Encounter (HOSPITAL_COMMUNITY): Payer: Self-pay | Admitting: Emergency Medicine

## 2020-05-30 DIAGNOSIS — Z20822 Contact with and (suspected) exposure to covid-19: Secondary | ICD-10-CM | POA: Diagnosis present

## 2020-05-30 DIAGNOSIS — D860 Sarcoidosis of lung: Secondary | ICD-10-CM | POA: Diagnosis present

## 2020-05-30 DIAGNOSIS — R569 Unspecified convulsions: Secondary | ICD-10-CM | POA: Diagnosis not present

## 2020-05-30 DIAGNOSIS — Z7982 Long term (current) use of aspirin: Secondary | ICD-10-CM

## 2020-05-30 DIAGNOSIS — Z8249 Family history of ischemic heart disease and other diseases of the circulatory system: Secondary | ICD-10-CM

## 2020-05-30 DIAGNOSIS — F419 Anxiety disorder, unspecified: Secondary | ICD-10-CM | POA: Diagnosis present

## 2020-05-30 DIAGNOSIS — Z87820 Personal history of traumatic brain injury: Secondary | ICD-10-CM

## 2020-05-30 DIAGNOSIS — I2510 Atherosclerotic heart disease of native coronary artery without angina pectoris: Secondary | ICD-10-CM | POA: Diagnosis present

## 2020-05-30 DIAGNOSIS — I10 Essential (primary) hypertension: Secondary | ICD-10-CM | POA: Diagnosis present

## 2020-05-30 DIAGNOSIS — Z79899 Other long term (current) drug therapy: Secondary | ICD-10-CM

## 2020-05-30 DIAGNOSIS — K219 Gastro-esophageal reflux disease without esophagitis: Secondary | ICD-10-CM | POA: Diagnosis present

## 2020-05-30 DIAGNOSIS — E785 Hyperlipidemia, unspecified: Secondary | ICD-10-CM | POA: Diagnosis present

## 2020-05-30 HISTORY — DX: Unspecified convulsions: R56.9

## 2020-05-30 LAB — CBC WITH DIFFERENTIAL/PLATELET
Abs Immature Granulocytes: 0.02 10*3/uL (ref 0.00–0.07)
Basophils Absolute: 0.1 10*3/uL (ref 0.0–0.1)
Basophils Relative: 1 %
Eosinophils Absolute: 0.1 10*3/uL (ref 0.0–0.5)
Eosinophils Relative: 2 %
HCT: 41.3 % (ref 39.0–52.0)
Hemoglobin: 13.9 g/dL (ref 13.0–17.0)
Immature Granulocytes: 0 %
Lymphocytes Relative: 37 %
Lymphs Abs: 2.1 10*3/uL (ref 0.7–4.0)
MCH: 29.3 pg (ref 26.0–34.0)
MCHC: 33.7 g/dL (ref 30.0–36.0)
MCV: 87.1 fL (ref 80.0–100.0)
Monocytes Absolute: 0.4 10*3/uL (ref 0.1–1.0)
Monocytes Relative: 7 %
Neutro Abs: 3 10*3/uL (ref 1.7–7.7)
Neutrophils Relative %: 53 %
Platelets: 289 10*3/uL (ref 150–400)
RBC: 4.74 MIL/uL (ref 4.22–5.81)
RDW: 13.2 % (ref 11.5–15.5)
WBC: 5.8 10*3/uL (ref 4.0–10.5)
nRBC: 0 % (ref 0.0–0.2)

## 2020-05-30 LAB — COMPREHENSIVE METABOLIC PANEL
ALT: 25 U/L (ref 0–44)
AST: 22 U/L (ref 15–41)
Albumin: 4.4 g/dL (ref 3.5–5.0)
Alkaline Phosphatase: 84 U/L (ref 38–126)
Anion gap: 9 (ref 5–15)
BUN: 17 mg/dL (ref 6–20)
CO2: 27 mmol/L (ref 22–32)
Calcium: 8.9 mg/dL (ref 8.9–10.3)
Chloride: 102 mmol/L (ref 98–111)
Creatinine, Ser: 1 mg/dL (ref 0.61–1.24)
GFR, Estimated: >60 mL/min (ref >60)
Glucose, Bld: 90 mg/dL (ref 70–99)
Potassium: 3.5 mmol/L (ref 3.5–5.1)
Sodium: 138 mmol/L (ref 135–145)
Total Bilirubin: 0.8 mg/dL (ref 0.3–1.2)
Total Protein: 6.9 g/dL (ref 6.5–8.1)

## 2020-05-30 LAB — TROPONIN I (HIGH SENSITIVITY): Troponin I (High Sensitivity): 2 ng/L (ref <18)

## 2020-05-30 MED ORDER — ACETAMINOPHEN 325 MG PO TABS
650.0000 mg | ORAL_TABLET | Freq: Once | ORAL | Status: AC
  Administered 2020-05-30: 650 mg via ORAL
  Filled 2020-05-30: qty 2

## 2020-05-30 MED ORDER — LEVETIRACETAM IN NACL 1000 MG/100ML IV SOLN
1000.0000 mg | Freq: Once | INTRAVENOUS | Status: AC
  Administered 2020-05-30: 1000 mg via INTRAVENOUS
  Filled 2020-05-30: qty 100

## 2020-05-30 NOTE — ED Provider Notes (Signed)
[facility] EMERGENCY DEPARTMENT Provider Note   CSN: [id number] Arrival date & time: 05/30/20  1937     History Chief Complaint  Patient presents with  . Seizures    Mathew Moore is a 55 y.o. male.  Patient complains of having a seizure today.  He was placed on Keppra this week by neurology for possible seizures.  Patient has no fevers chills he is back to his normal self  The history is provided by the patient. No language interpreter was used.  Seizures Seizure activity on arrival: yes   Seizure type:  Grand mal Initial focality:  None Episode characteristics: abnormal movements   Postictal symptoms: confusion   Return to baseline: yes   Severity:  Moderate Timing:  Once Progression:  Resolved Context: not alcohol withdrawal        Past Medical History:  Diagnosis Date  . Anxiety    ocassional  . Atypical mole 08/24/2019   mild- right lower leg - anterior  . GERD (gastroesophageal reflux disease)   . Hypertension   . Sarcoidosis 07/2019    Patient Active Problem List   Diagnosis Date Noted  . Syncope 05/14/2020  . Hypokalemia 05/14/2020  . Anxiety   . Seizure-like activity ([place])   . Sarcoidosis 08/10/2019  . CAD (coronary artery disease) 07/2019  . 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 Lymphadenopathy, mediastinal 06/11/2019  . Abnormal stress test   . Chest pain     Past Surgical History:  Procedure Laterality Date  . BRONCHOSCOPY    . CLEFT LIP REPAIR    . COLONOSCOPY    . HERNIA REPAIR Left   . LEFT HEART CATH AND CORONARY ANGIOGRAPHY N/A 08/23/2018   Procedure: LEFT HEART CATH AND CORONARY ANGIOGRAPHY;  Surgeon: Burnell Blanks, MD;  Location: [place] [facility];  Service: Cardiovascular;  Laterality: N/A;  . MEDIASTINOSCOPY N/A 07/20/2019   Procedure: MEDIASTINOSCOPY;  Surgeon: Melrose Nakayama, MD;  Location: [place];  Service: Thoracic;  Laterality: N/A;  . NASAL SEPTUM SURGERY    . PLANTAR FASCIA SURGERY Right   . VIDEO BRONCHOSCOPY  WITH ENDOBRONCHIAL ULTRASOUND N/A 07/06/2019   Procedure: VIDEO BRONCHOSCOPY WITH ENDOBRONCHIAL ULTRASOUND;  Surgeon: Marshell Garfinkel, MD;  Location: [place];  Service: Pulmonary;  Laterality: N/A;       Family History  Problem Relation Age of Onset  . Breast cancer Mother   . Heart disease Father   . Lung cancer Father   . Heart attack Father     Social History   Tobacco Use  . Smoking status: Never Smoker  . Smokeless tobacco: Never Used  Vaping Use  . Vaping Use: Never used  Substance Use Topics  . Alcohol use: Not Currently  . Drug use: Not Currently    Home Medications Prior to Admission medications   Medication Sig Start Date End Date Taking? Authorizing Provider  acetaminophen (TYLENOL) 500 MG tablet Take 1,000 mg by mouth every 6 (six) hours as needed for moderate pain or headache.    Yes [provider]  albuterol (VENTOLIN HFA) 108 (90 Base) MCG/ACT inhaler Inhale 1-2 puffs into the lungs every 6 (six) hours as needed for wheezing or shortness of breath. 09/11/19  Yes Martyn Ehrich, NP  aspirin EC 81 MG tablet Take 81 mg by mouth daily. Swallow whole.   Yes [provider]  atorvastatin (LIPITOR) 40 MG tablet Take 40 mg by mouth daily.   Yes [provider]  Cholecalciferol (VITAMIN D) 50  MCG (2000 UT) CAPS Take 2,000 Units by mouth daily.   Yes [provider]  citalopram (CELEXA) 20 MG tablet Take 20 mg by mouth daily.   Yes [provider]  clonazePAM (KLONOPIN) 0.5 MG tablet Take 0.5 mg by mouth 3 (three) times daily as needed for anxiety.   Yes [provider]  indapamide (LOZOL) 1.25 MG tablet Take 1.25 mg by mouth See admin instructions. Take 1/2 tablet daily 04/09/20  Yes [provider]  levETIRAcetam (KEPPRA) 500 MG tablet Take 1 tablet (500 mg total) by mouth 2 (two) times daily. 05/27/20  Yes Penumalli, Earlean Polka, MD  losartan (COZAAR) 25 MG tablet Take 25 mg by mouth daily. 05/16/20  Yes [provider]  omeprazole (PRILOSEC) 40 MG capsule Take 1 capsule (40 mg total) by mouth in the morning and at bedtime. 11/20/19  Yes Spero Geralds, MD  testosterone cypionate (DEPOTESTOSTERONE CYPIONATE) 200 MG/ML injection Inject 200 mg into the muscle every 30 (thirty) days. 05/01/20  Yes [provider]  zolpidem (AMBIEN) 10 MG tablet Take 10 mg by mouth at bedtime.  05/01/19  Yes [provider]    Allergies    Patient has no known allergies.  Review of Systems   Review of Systems  Constitutional: Negative for appetite change and fatigue.  HENT: Negative for congestion, ear discharge and sinus pressure.   Eyes: Negative for discharge.  Respiratory: Negative for cough.   Cardiovascular: Negative for chest pain.  Gastrointestinal: Negative for abdominal pain and diarrhea.  Genitourinary: Negative for frequency and hematuria.  Musculoskeletal: Negative for back pain.  Skin: Negative for rash.  Neurological: Positive for seizures. Negative for headaches.  Psychiatric/Behavioral: Negative for hallucinations.    Physical Exam Updated Vital Signs BP 116/68   Pulse (!) 58   Temp 98.7 F (37.1 C) (Oral)   Resp 20   Ht 5\' 7"  (1.702 m)   Wt 95 kg   SpO2 96%   BMI 32.80 kg/m   Physical Exam Vitals and nursing note reviewed.  Constitutional:      Appearance: He is well-developed.  HENT:     Head: Normocephalic.     Nose: Nose normal.  Eyes:     General: No scleral icterus.    Conjunctiva/sclera: Conjunctivae normal.  Neck:     Thyroid: No thyromegaly.  Cardiovascular:     Rate and Rhythm: Normal rate and regular rhythm.     Heart sounds: No murmur heard. No friction rub. No gallop.   Pulmonary:     Breath sounds: No stridor. No wheezing or rales.  Chest:     Chest wall: No tenderness.  Abdominal:     General: There is no distension.     Tenderness: There is no abdominal tenderness. There is no rebound.  Musculoskeletal:        General: Normal  range of motion.     Cervical back: Neck supple.  Lymphadenopathy:     Cervical: No cervical adenopathy.  Skin:    Findings: No erythema or rash.  Neurological:     Mental Status: He is alert and oriented to person, place, and time.     Motor: No abnormal muscle tone.     Coordination: Coordination normal.  Psychiatric:        Behavior: Behavior normal.     ED Results / Procedures / Treatments   Labs (all labs ordered are listed, but only abnormal results are displayed) Labs Reviewed  CBC WITH DIFFERENTIAL/PLATELET  COMPREHENSIVE METABOLIC PANEL  TROPONIN I (HIGH SENSITIVITY)  TROPONIN I (HIGH SENSITIVITY)    EKG None  Radiology No results found.  Procedures Procedures   Medications Ordered in ED Medications  levETIRAcetam (KEPPRA) IVPB 1000 mg/100 mL premix (0 mg Intravenous Stopped 05/30/20 2118)  acetaminophen (TYLENOL) tablet 650 mg (650 mg Oral Given 05/30/20 2244)    ED Course  I have reviewed the triage vital signs and the nursing notes.  Pertinent labs & imaging results that were available during my care of the patient were reviewed by me and considered in my medical decision making (see chart for details). CRITICAL CARE Performed by: Milton Ferguson Total critical care time: 35 minutes Critical care time was exclusive of separately billable procedures and treating other patients. Critical care was necessary to treat or prevent imminent or life-threatening deterioration. Critical care was time spent personally by me on the following activities: development of treatment plan with patient and/or surrogate as well as nursing, discussions with consultants, evaluation of patient's response to treatment, examination of patient, obtaining history from patient or surrogate, ordering and performing treatments and interventions, ordering and review of laboratory studies, ordering and review of radiographic studies, pulse oximetry and re-evaluation of patient's  condition.   Patient had a seizure in the emergency department that lasted about a minute.  5 minutes after his seizure he was back to normal again not sure if this is a pseudoseizure or real seizure.  I spoke with neurology and they recommend admitting the patient over to [facility] by the hospitalist and consulting neurology so they can get continuous EEG MDM Rules/Calculators/A&P                          Seizure versus pseudoseizure.  Patient will be admitted to [facility] for continuous EEG Final Clinical Impression(s) / ED Diagnoses Final diagnoses:  Seizure [facility])    Rx / DC Orders ED Discharge Orders    None       Milton Ferguson, MD 05/31/20 1521

## 2020-05-30 NOTE — ED Notes (Signed)
Pt c/o headache- Dr Roderic Palau aware- new orders received.

## 2020-05-30 NOTE — ED Notes (Addendum)
Pt had jerking of all extremities, pt bit tongue and had saliva drooling from mouth. Pt HR increased to 100 and oxygen remained 95-100%. This lasted for about one minute- Dr Roderic Palau to bedside to witness. After jerking stopped Pt says he remembers his wife was talking and then someone tried to open his eyes. Pt knows his name, birthday, what year it currently is now, and can name the president. Pt also states his wife and when asked who this person is and is able to tell me he is at hospital. Pt did not loose continence. Pt does not appear post-ictal

## 2020-05-30 NOTE — ED Triage Notes (Signed)
Pt brought in from home for seizure-like activity. Pt was found sliding out of chair but arousable once EMS arrived. Pt alert and oriented at this time.

## 2020-05-31 ENCOUNTER — Observation Stay (HOSPITAL_COMMUNITY): Payer: 59

## 2020-05-31 DIAGNOSIS — I2510 Atherosclerotic heart disease of native coronary artery without angina pectoris: Secondary | ICD-10-CM | POA: Diagnosis not present

## 2020-05-31 DIAGNOSIS — Z79899 Other long term (current) drug therapy: Secondary | ICD-10-CM | POA: Diagnosis not present

## 2020-05-31 DIAGNOSIS — Z87820 Personal history of traumatic brain injury: Secondary | ICD-10-CM | POA: Diagnosis not present

## 2020-05-31 DIAGNOSIS — E785 Hyperlipidemia, unspecified: Secondary | ICD-10-CM | POA: Diagnosis not present

## 2020-05-31 DIAGNOSIS — F419 Anxiety disorder, unspecified: Secondary | ICD-10-CM | POA: Diagnosis not present

## 2020-05-31 DIAGNOSIS — I10 Essential (primary) hypertension: Secondary | ICD-10-CM | POA: Diagnosis not present

## 2020-05-31 DIAGNOSIS — R569 Unspecified convulsions: Principal | ICD-10-CM

## 2020-05-31 DIAGNOSIS — Z7982 Long term (current) use of aspirin: Secondary | ICD-10-CM | POA: Diagnosis not present

## 2020-05-31 DIAGNOSIS — Z20822 Contact with and (suspected) exposure to covid-19: Secondary | ICD-10-CM | POA: Diagnosis not present

## 2020-05-31 DIAGNOSIS — Z8249 Family history of ischemic heart disease and other diseases of the circulatory system: Secondary | ICD-10-CM | POA: Diagnosis not present

## 2020-05-31 DIAGNOSIS — D860 Sarcoidosis of lung: Secondary | ICD-10-CM | POA: Diagnosis not present

## 2020-05-31 DIAGNOSIS — K219 Gastro-esophageal reflux disease without esophagitis: Secondary | ICD-10-CM | POA: Diagnosis not present

## 2020-05-31 LAB — CBC
HCT: 40.3 % (ref 39.0–52.0)
Hemoglobin: 13.8 g/dL (ref 13.0–17.0)
MCH: 29 pg (ref 26.0–34.0)
MCHC: 34.2 g/dL (ref 30.0–36.0)
MCV: 84.7 fL (ref 80.0–100.0)
Platelets: 266 10*3/uL (ref 150–400)
RBC: 4.76 MIL/uL (ref 4.22–5.81)
RDW: 13.2 % (ref 11.5–15.5)
WBC: 5.4 10*3/uL (ref 4.0–10.5)
nRBC: 0 % (ref 0.0–0.2)

## 2020-05-31 LAB — COMPREHENSIVE METABOLIC PANEL
ALT: 23 U/L (ref 0–44)
AST: 21 U/L (ref 15–41)
Albumin: 4 g/dL (ref 3.5–5.0)
Alkaline Phosphatase: 76 U/L (ref 38–126)
Anion gap: 7 (ref 5–15)
BUN: 15 mg/dL (ref 6–20)
CO2: 31 mmol/L (ref 22–32)
Calcium: 8.9 mg/dL (ref 8.9–10.3)
Chloride: 103 mmol/L (ref 98–111)
Creatinine, Ser: 1.15 mg/dL (ref 0.61–1.24)
GFR, Estimated: >60 mL/min (ref >60)
Glucose, Bld: 98 mg/dL (ref 70–99)
Potassium: 4 mmol/L (ref 3.5–5.1)
Sodium: 141 mmol/L (ref 135–145)
Total Bilirubin: 0.9 mg/dL (ref 0.3–1.2)
Total Protein: 6.2 g/dL — ABNORMAL LOW (ref 6.5–8.1)

## 2020-05-31 LAB — TROPONIN I (HIGH SENSITIVITY): Troponin I (High Sensitivity): 3 ng/L (ref <18)

## 2020-05-31 LAB — MAGNESIUM: Magnesium: 2 mg/dL (ref 1.7–2.4)

## 2020-05-31 LAB — MRSA PCR SCREENING: MRSA by PCR: NEGATIVE

## 2020-05-31 LAB — SARS CORONAVIRUS 2 (TAT 6-24 HRS): SARS Coronavirus 2: NEGATIVE

## 2020-05-31 MED ORDER — ACETAMINOPHEN 650 MG RE SUPP: 650.0000 mg | RECTAL | Status: DC | PRN

## 2020-05-31 MED ORDER — ASPIRIN EC 81 MG PO TBEC
81.0000 mg | DELAYED_RELEASE_TABLET | Freq: Every day | ORAL | Status: DC
  Administered 2020-05-31 – 2020-06-02 (×3): 81 mg via ORAL
  Filled 2020-05-31 (×3): qty 1

## 2020-05-31 MED ORDER — LOSARTAN POTASSIUM 50 MG PO TABS
25.0000 mg | ORAL_TABLET | Freq: Every day | ORAL | Status: DC
  Administered 2020-05-31 – 2020-06-02 (×3): 25 mg via ORAL
  Filled 2020-05-31 (×3): qty 1

## 2020-05-31 MED ORDER — ACETAMINOPHEN 325 MG PO TABS
650.0000 mg | ORAL_TABLET | ORAL | Status: DC | PRN
  Administered 2020-05-31 – 2020-06-01 (×2): 650 mg via ORAL
  Filled 2020-05-31 (×2): qty 2

## 2020-05-31 MED ORDER — ATORVASTATIN CALCIUM 40 MG PO TABS
40.0000 mg | ORAL_TABLET | Freq: Every day | ORAL | Status: DC
  Administered 2020-05-31 – 2020-06-02 (×3): 40 mg via ORAL
  Filled 2020-05-31 (×3): qty 1

## 2020-05-31 MED ORDER — LEVETIRACETAM 500 MG PO TABS
500.0000 mg | ORAL_TABLET | Freq: Two times a day (BID) | ORAL | Status: DC
  Administered 2020-05-31 – 2020-06-01 (×3): 500 mg via ORAL
  Filled 2020-05-31 (×3): qty 1

## 2020-05-31 MED ORDER — ONDANSETRON HCL 4 MG/2ML IJ SOLN: 4.0000 mg | Freq: Four times a day (QID) | INTRAMUSCULAR | Status: DC | PRN

## 2020-05-31 MED ORDER — INDAPAMIDE 1.25 MG PO TABS
0.6250 mg | ORAL_TABLET | Freq: Every day | ORAL | Status: DC
  Administered 2020-05-31 – 2020-06-02 (×3): 0.625 mg via ORAL
  Filled 2020-05-31 (×3): qty 1

## 2020-05-31 MED ORDER — HEPARIN SODIUM (PORCINE) 5000 UNIT/ML IJ SOLN
5000.0000 [IU] | Freq: Three times a day (TID) | INTRAMUSCULAR | Status: DC
  Administered 2020-05-31 – 2020-06-02 (×7): 5000 [IU] via SUBCUTANEOUS
  Filled 2020-05-31 (×7): qty 1

## 2020-05-31 MED ORDER — CLONAZEPAM 0.5 MG PO TABS: 0.5000 mg | ORAL_TABLET | Freq: Three times a day (TID) | ORAL | Status: DC | PRN

## 2020-05-31 MED ORDER — SODIUM CHLORIDE 0.9 % IV SOLN
75.0000 mL/h | INTRAVENOUS | Status: DC
  Administered 2020-05-31 – 2020-06-01 (×2): 75 mL/h via INTRAVENOUS

## 2020-05-31 MED ORDER — PANTOPRAZOLE SODIUM 40 MG PO TBEC
40.0000 mg | DELAYED_RELEASE_TABLET | Freq: Every day | ORAL | Status: DC
  Administered 2020-05-31 – 2020-06-02 (×3): 40 mg via ORAL
  Filled 2020-05-31 (×3): qty 1

## 2020-05-31 MED ORDER — ZOLPIDEM TARTRATE 5 MG PO TABS
10.0000 mg | ORAL_TABLET | Freq: Every day | ORAL | Status: DC
  Administered 2020-05-31 – 2020-06-01 (×2): 10 mg via ORAL
  Filled 2020-05-31 (×2): qty 2

## 2020-05-31 MED ORDER — LORAZEPAM 2 MG/ML IJ SOLN: 1.0000 mg | INTRAMUSCULAR | Status: DC | PRN

## 2020-05-31 MED ORDER — LORAZEPAM 2 MG/ML IJ SOLN: 4.0000 mg | INTRAMUSCULAR | Status: AC

## 2020-05-31 MED ORDER — CITALOPRAM HYDROBROMIDE 20 MG PO TABS
20.0000 mg | ORAL_TABLET | Freq: Every day | ORAL | Status: DC
  Administered 2020-05-31 – 2020-06-02 (×3): 20 mg via ORAL
  Filled 2020-05-31 (×3): qty 1

## 2020-05-31 MED ORDER — ONDANSETRON HCL 4 MG PO TABS: 4.0000 mg | ORAL_TABLET | Freq: Four times a day (QID) | ORAL | Status: DC | PRN

## 2020-05-31 MED ORDER — ALBUTEROL SULFATE HFA 108 (90 BASE) MCG/ACT IN AERS: 1.0000 | INHALATION_SPRAY | Freq: Four times a day (QID) | RESPIRATORY_TRACT | Status: DC | PRN

## 2020-05-31 NOTE — Progress Notes (Signed)
Patient ID: Mathew Moore, male   DOB: [birth year], 55 y.o.   MRN: [id number] This is a no charge progress note as patient was admitted this AM.  Patient seen and examined chart reviewed.  Currently eating [employer] wife at bedside.  Mathew Moore  is a 55 y.o. male, with history of sarcoidosis, hypertension, GERD, hyperlipidemia presents the ED with a chief complaint of seizure.  Patient has been being worked up for seizure-like activity.  Vss, cta  Regular s1s2 Soft benign No edema  A/P neurology input was appreciated - I ordered cEEG - continue Seizure precautions - continue Keppra 500mg  BID for now

## 2020-05-31 NOTE — ED Notes (Signed)
Report given to Zach with [facility] °

## 2020-05-31 NOTE — Progress Notes (Signed)
Overnight EEG in process, result pending. 

## 2020-05-31 NOTE — Consult Note (Signed)
NEUROLOGY CONSULTATION NOTE   Date of service: May 31, 2020 Patient Name: Mathew Moore MRN:  [id number] DOB:  26-Mar-1965 Reason for consult: "Seiure vs PNES" Requesting Provider: [name], DO _ _ _   _ __   _ __ _ _  __ __   _ __   __ _  History of Present Illness  Mathew Moore is a 55 y.o. male with PMH significant for anxiety, GERD, HTN, Pulm Sarcoidosis, HLD who presented to the ED for fourth time in the last 2 weeks for episodes concerning for seizure.  He had 2 episodes today. First was a starring off event while he was sitting in the recliner, lasted less than a minute and was back to his baseline. Second event was loss of consciousness, followed by GTC for a minute and then 15-20 mins of confusion with back to his baseline. Case was discussed with me and given the frequency of the events and multiple ED visits over the last 2 weeks, plan to see if we can characterize these events on cEEG and characterize them.  He was initially seen by our team on 05/14/20 in the hospital for 2 episodes of transient LOC, he stiffened after one of the episodes. Workup with MRI Brain with no acute abnormalities, rEEG with no epileptiform discharges. He reported preserved memory during that shaking episode and overall impression was that this was not an epileptic event. No AEDs were started.  He was seen by Dr. Leta Baptist with [facility] neurology on 05/27/20, patient had 2 more episodes between discharge and follow up appointment with outpatient neurology. Video of event from 4/25 was reviewed by Dr. Leta Baptist and felt non epileptic. He was seen in the ED for that event thou and started on Keppra 500mg  BID.  No prior personal hx of seizures, father had seizures as an adult from EtOH withdrawal, no hx of strokes, no hx of ICH, no hx of meningitis, had an episode about a year ago where he fell of a stool and hit his back and head on concrete with no loss of consciousness, no issues  afterwards.  Denies any significant stressors in his life at this time. Is on Clonazepam at home and takes about 1 pill a day along with Ambien for sleep. Does not drink EtOH.   ROS   Constitutional Denies weight loss, fever and chills.   HEENT Denies changes in vision and hearing.   Respiratory Denies SOB and cough.   CV Denies palpitations and CP   GI Denies abdominal pain, nausea, vomiting and diarrhea.   GU Denies dysuria and urinary frequency.   MSK Denies myalgia and joint pain.   Skin Denies rash and pruritus.   Neurological Denies headache and syncope.   Psychiatric Denies recent changes in mood. Denies anxiety and depression.    Past History   Past Medical History:  Diagnosis Date  . Anxiety    ocassional  . Atypical mole 08/24/2019   mild- right lower leg - anterior  . GERD (gastroesophageal reflux disease)   . Hypertension   . Sarcoidosis 07/2019   Past Surgical History:  Procedure Laterality Date  . BRONCHOSCOPY    . CLEFT LIP REPAIR    . COLONOSCOPY    . HERNIA REPAIR Left   . LEFT HEART CATH AND CORONARY ANGIOGRAPHY N/A 08/23/2018   Procedure: LEFT HEART CATH AND CORONARY ANGIOGRAPHY;  Surgeon: Burnell Blanks, MD;  Location: [facility];  Service: Cardiovascular;  Laterality: N/A;  .  MEDIASTINOSCOPY N/A 07/20/2019   Procedure: MEDIASTINOSCOPY;  Surgeon: Loreli SlotHendrickson, Steven C, MD;  [facility];  Service: Thoracic;  Laterality: N/A;  . NASAL SEPTUM SURGERY    . PLANTAR FASCIA SURGERY Right   . VIDEO BRONCHOSCOPY WITH ENDOBRONCHIAL ULTRASOUND N/A 07/06/2019   Procedure: VIDEO BRONCHOSCOPY WITH ENDOBRONCHIAL ULTRASOUND;  Surgeon: Chilton GreathouseMannam, Praveen, MD;  Location: [facility] OR;  Service: Pulmonary;  Laterality: N/A;   Family History  Problem Relation Age of Onset  . Breast cancer Mother   . Heart disease Father   . Lung cancer Father   . Heart attack Father    Social History   Socioeconomic History  . Marital status: Married    Spouse name:  [name]  . Number of children: 2  . Years of education: Not on file  . Highest education level: Not on file  Occupational History  . Not on file  Tobacco Use  . Smoking status: Never Smoker  . Smokeless tobacco: Never Used  Vaping Use  . Vaping Use: Never used  Substance and Sexual Activity  . Alcohol use: Not Currently  . Drug use: Not Currently  . Sexual activity: Not on file  Other Topics Concern  . Not on file  Social History Narrative   Lives with wife   Social Determinants of Health   Financial Resource Strain: Low Risk   . Difficulty of Paying Living Expenses: Not hard at all  Food Insecurity: No Food Insecurity  . Worried About Programme researcher, broadcasting/film/videounning Out of Food in the Last Year: Never true  . Ran Out of Food in the Last Year: Never true  Transportation Needs: No Transportation Needs  . Lack of Transportation (Medical): No  . Lack of Transportation (Non-Medical): No  Physical Activity: Sufficiently Active  . Days of Exercise per Week: 4 days  . Minutes of Exercise per Session: 40 min  Stress: No Stress Concern Present  . Feeling of Stress : Not at all  Social Connections: Moderately Integrated  . Frequency of Communication with Friends and Family: Three times a week  . Frequency of Social Gatherings with Friends and Family: Three times a week  . Attends Religious Services: More than 4 times per year  . Active Member of Clubs or Organizations: No  . Attends BankerClub or Organization Meetings: Never  . Marital Status: Married   No Known Allergies  Medications   Facility-Administered Medications Prior to Admission  Medication Dose Route Frequency Provider Last Rate Last Admin  . sodium chloride flush (NS) 0.9 % injection 3 mL  3 mL Intravenous Q12H Wendall StadeNishan, Peter C, MD       Medications Prior to Admission  Medication Sig Dispense Refill Last Dose  . acetaminophen (TYLENOL) 500 MG tablet Take 1,000 mg by mouth every 6 (six) hours as needed for moderate pain or headache.      .  albuterol (VENTOLIN HFA) 108 (90 Base) MCG/ACT inhaler Inhale 1-2 puffs into the lungs every 6 (six) hours as needed for wheezing or shortness of breath. 18 g 1   . aspirin EC 81 MG tablet Take 81 mg by mouth daily. Swallow whole.   05/30/2020 at Unknown time  . atorvastatin (LIPITOR) 40 MG tablet Take 40 mg by mouth daily.   05/30/2020 at Unknown time  . Cholecalciferol (VITAMIN D) 50 MCG (2000 UT) CAPS Take 2,000 Units by mouth daily.   05/30/2020 at Unknown time  . citalopram (CELEXA) 20 MG tablet Take 20 mg by mouth daily.   05/30/2020 at Unknown  time  . clonazePAM (KLONOPIN) 0.5 MG tablet Take 0.5 mg by mouth 3 (three) times daily as needed for anxiety.   05/30/2020 at Unknown time  . indapamide (LOZOL) 1.25 MG tablet Take 1.25 mg by mouth See admin instructions. Take 1/2 tablet daily   05/30/2020 at Unknown time  . levETIRAcetam (KEPPRA) 500 MG tablet Take 1 tablet (500 mg total) by mouth 2 (two) times daily. 180 tablet 4 05/30/2020 at Unknown time  . losartan (COZAAR) 25 MG tablet Take 25 mg by mouth daily.   05/30/2020 at Unknown time  . omeprazole (PRILOSEC) 40 MG capsule Take 1 capsule (40 mg total) by mouth in the morning and at bedtime. 60 capsule 11 05/30/2020 at Unknown time  . testosterone cypionate (DEPOTESTOSTERONE CYPIONATE) 200 MG/ML injection Inject 200 mg into the muscle every 30 (thirty) days.   05/06/2020  . zolpidem (AMBIEN) 10 MG tablet Take 10 mg by mouth at bedtime.    05/29/2020 at Unknown time     Vitals   Vitals:   05/31/20 0200 05/31/20 0230 05/31/20 0333 05/31/20 0334  BP: 112/80 111/70  132/73  Pulse: 62 (!) 56 (!) 57 (!) 57  Resp: 16 17 (!) 23 15  Temp: 98.1 F (36.7 C)  98.1 F (36.7 C)   TempSrc: Oral  Oral   SpO2: 96% 96% 96% 99%  Weight:      Height:         Body mass index is 32.8 kg/m.  Physical Exam   General: Laying comfortably in bed; in no acute distress.  HENT: Normal oropharynx and mucosa. Normal external appearance of ears and nose. Neck:  Supple, no pain or tenderness  CV: No JVD. No peripheral edema.  Pulmonary: Symmetric Chest rise. Normal respiratory effort. Abdomen: Soft to touch, non-tender.  Ext: No cyanosis, edema, or deformity  Skin: No rash. Normal palpation of skin.   Musculoskeletal: Normal digits and nails by inspection. No clubbing.   Neurologic Examination  Mental status/Cognition: Alert, oriented to self, place, month and year, good attention.  Speech/language: Fluent, comprehension intact, object naming intact, repetition intact.  Cranial nerves:   CN II Pupils equal and reactive to light, no VF deficits    CN III,IV,VI EOM intact, no gaze preference or deviation, no nystagmus    CN V normal sensation in V1, V2, and V3 segments bilaterally    CN VII no asymmetry, no nasolabial fold flattening    CN VIII normal hearing to speech    CN IX & X normal palatal elevation, no uvular deviation    CN XI 5/5 head turn and 5/5 shoulder shrug bilaterally    CN XII midline tongue protrusion    Motor:  Muscle bulk: normal, tone normal, pronator drift none tremor none Mvmt Root Nerve  Muscle Right Left Comments  SA C5/6 Ax Deltoid 5 5   EF C5/6 Mc Biceps 5 5   EE C6/7/8 Rad Triceps 5 5   WF C6/7 Med FCR     WE C7/8 PIN ECU     F Ab C8/T1 U ADM/FDI 5 5   HF L1/2/3 Fem Illopsoas 5 5   KE L2/3/4 Fem Quad 5 5   DF L4/5 D Peron Tib Ant 5 5   PF S1/2 Tibial Grc/Sol 5 5    Reflexes:  Right Left Comments  Pectoralis      Biceps (C5/6) 1 1   Brachioradialis (C5/6) 1 1    Triceps (C6/7) 1 1    Patellar (  L3/4) 1 1    Achilles (S1)      Hoffman      Plantar     Jaw jerk    Sensation:  Light touch intact   Pin prick    Temperature    Vibration   Proprioception    Coordination/Complex Motor:  - Finger to Nose intact - Heel to shin intact - Rapid alternating movement are normal - Gait: deferred.  Labs   CBC:  Recent Labs  Lab 05/26/20 0512 05/30/20 2019  WBC 4.2 5.8  NEUTROABS 1.9 3.0  HGB 13.3  13.9  HCT 39.5 41.3  MCV 88.2 87.1  PLT 272 379    Basic Metabolic Panel:  Lab Results  Component Value Date   NA 138 05/30/2020   K 3.5 05/30/2020   CO2 27 05/30/2020   GLUCOSE 90 05/30/2020   BUN 17 05/30/2020   CREATININE 1.00 05/30/2020   CALCIUM 8.9 05/30/2020   GFRNONAA >60 05/30/2020   GFRAA >60 07/20/2019   Lipid Panel: No results found for: LDLCALC HgbA1c: No results found for: HGBA1C Urine Drug Screen:     Component Value Date/Time   LABOPIA NONE DETECTED 05/26/2020 0427   COCAINSCRNUR NONE DETECTED 05/26/2020 0427   LABBENZ POSITIVE (A) 05/26/2020 0427   AMPHETMU NONE DETECTED 05/26/2020 0427   THCU NONE DETECTED 05/26/2020 0427   LABBARB NONE DETECTED 05/26/2020 0427    Alcohol Level     Component Value Date/Time   ETH <10 05/26/2020 0513    MRI Brain(05/14/20): 1. Normal for age MRI appearance of the brain. No acute intracranial abnormality. 2. Moderate left maxillary sinus disease.  REEG(05/14/20):   This is a normal adult EEG in the awake, drowsy, and sleep states. No focal slowing, focal abnormalities, epileptiform discharges, or electrographic seizures are seen.  Of note, a normal EEG does not exclude the diagnosis of seizure disorder.  A repeat EEG or 24 hour EEG could be considered.  Clinical correlation is recommended.  Impression   JAMONTAE THWAITES is a 55 y.o. male with new onset seizure like events and multiple visits to the ED in the last 2 weeks. He has spells involving starring off along with events with GTCs. Minimal to no post ictal period and in some of the events, he is somewhat aware of what it going on. The characteristics of some of these events do sound Non epileptic in nature. None have been captured on EEG.  Will continue Keppra for now and plan for LTM in AM with hopes to capture his events over the next 24 hours given the increased frequency of these events.  Recommendations  - I ordered cEEG - continue Seizure precautions -  continue Keppra 500mg  BID for now. - Discussed seizure precautions with him. He was working asa Clinical cytogeneticist at [employer] and I discussed with him about not operating heavy machines until seizure free for 6 months. - Neurology will continue to follow. ______________________________________________________________________   Thank you for the opportunity to take part in the care of this patient. If you have any further questions, please contact the neurology consultation attending.  Signed,  Allen Pager Number [phone] _ _ _   _ __   _ __ _ _  __ __   _ __   __ _

## 2020-05-31 NOTE — ED Notes (Signed)
[facility] at bedside to transport pt

## 2020-05-31 NOTE — H&P (Signed)
TRH H&P    Patient Demographics:    Mathew Moore, is a 55 y.o. male  MRN: [id number]  DOB - Mar 30, 1965  Admit Date - 05/30/2020  Referring MD/NP/PA: Roderic Palau  Outpatient Primary MD for the patient is Sasser, Silvestre Moment, MD  Patient coming from: Home  Chief complaint- Seizure   HPI:    Mathew Moore  is a 55 y.o. male, with history of sarcoidosis, hypertension, GERD, hyperlipidemia presents the ED with a chief complaint of seizure.  Patient has been being worked up for seizure-like activity.  He recently started Keppra 4 days ago, and reports no missed doses.  He reports that today he had 2 seizures.  He reports he was sitting watching TV talking with his wife, when he heard his wife say what is wrong and he felt a blank.  He reports that he started breathing hard and then his whole body started shaking, he could hear and understand what was going on around him but mostly forgets what happened.  He reports that he is oriented upon coming out of the episode.  There is no postictal state.  He reports that in the past he has had urinary incontinence during his seizure-like activity, but that did not happen today.  He reports that he did bite his tongue.  After first episode he did have a headache as well.  He reports that his second episode which was very similar to the first 1, he fell out of the recliner onto the ground.  Patient denies any fevers or cough.  He reports that he does not have dyspnea during the episode.  He reports that he has had palpitations, nausea recently.  He denies any belly pain, dysuria, diarrhea.  He denies any skin changes.  Patient has no other complaints at this time.  Patient does not smoke, does not drink alcohol, does not use illicit drugs.  Patient is vaccinated for COVID.  Patient is full code.  In the ED Temperature 98.7, heart rate 62-69, respiratory rate 15-23, blood pressure 128/80,  satting at 95% No leukocytosis, hemoglobin stable at 13.9, chemistry panel is unremarkable, troponin is 2, alcohol level is less than 10, UDS shows benzos-patient does have a prescription for Klonopin which he reports he takes 3 times a day every day. Keppra load of 1 g, Tylenol given for headache Neuro consulted from the ED and recommends transfer to [facility] for continuous EEG    Review of systems:    In addition to the HPI above,  No Fever-chills, No Headache, No changes with Vision or hearing, No problems swallowing food or Liquids, No Chest pain, Cough, admits to shortness of breath No Abdominal pain, admits to nausea but no vomiting, bowel movements are regular, No Blood in stool or Urine, No dysuria, No new skin rashes or bruises, No new joints pains-aches,  No new weakness, tingling, numbness in any extremity, No recent weight gain or loss, No polyuria, polydypsia or polyphagia, No significant Mental Stressors specifically reports no increase in stress  All other systems reviewed and  are negative.    Past History of the following :    Past Medical History:  Diagnosis Date  . Anxiety    ocassional  . Atypical mole 08/24/2019   mild- right lower leg - anterior  . GERD (gastroesophageal reflux disease)   . Hypertension   . Sarcoidosis 07/2019      Past Surgical History:  Procedure Laterality Date  . BRONCHOSCOPY    . CLEFT LIP REPAIR    . COLONOSCOPY    . HERNIA REPAIR Left   . LEFT HEART CATH AND CORONARY ANGIOGRAPHY N/A 08/23/2018   Procedure: LEFT HEART CATH AND CORONARY ANGIOGRAPHY;  Surgeon: Burnell Blanks, MD;  Location: [facility];  Service: Cardiovascular;  Laterality: N/A;  . MEDIASTINOSCOPY N/A 07/20/2019   Procedure: MEDIASTINOSCOPY;  Surgeon: Melrose Nakayama, MD;  Location: [facility];  Service: Thoracic;  Laterality: N/A;  . NASAL SEPTUM SURGERY    . PLANTAR FASCIA SURGERY Right   . VIDEO BRONCHOSCOPY WITH ENDOBRONCHIAL  ULTRASOUND N/A 07/06/2019   Procedure: VIDEO BRONCHOSCOPY WITH ENDOBRONCHIAL ULTRASOUND;  Surgeon: Marshell Garfinkel, MD;  Location: [facility];  Service: Pulmonary;  Laterality: N/A;      Social History:      Social History   Tobacco Use  . Smoking status: Never Smoker  . Smokeless tobacco: Never Used  Substance Use Topics  . Alcohol use: Not Currently       Family History :     Family History  Problem Relation Age of Onset  . Breast cancer Mother   . Heart disease Father   . Lung cancer Father   . Heart attack Father       Home Medications:   Prior to Admission medications   Medication Sig Start Date End Date Taking? Authorizing Provider  acetaminophen (TYLENOL) 500 MG tablet Take 1,000 mg by mouth every 6 (six) hours as needed for moderate pain or headache.    Yes [provider]  albuterol (VENTOLIN HFA) 108 (90 Base) MCG/ACT inhaler Inhale 1-2 puffs into the lungs every 6 (six) hours as needed for wheezing or shortness of breath. 09/11/19  Yes Martyn Ehrich, NP  aspirin EC 81 MG tablet Take 81 mg by mouth daily. Swallow whole.   Yes [provider]  atorvastatin (LIPITOR) 40 MG tablet Take 40 mg by mouth daily.   Yes [provider]  Cholecalciferol (VITAMIN D) 50 MCG (2000 UT) CAPS Take 2,000 Units by mouth daily.   Yes [provider]  citalopram (CELEXA) 20 MG tablet Take 20 mg by mouth daily.   Yes [provider]  clonazePAM (KLONOPIN) 0.5 MG tablet Take 0.5 mg by mouth 3 (three) times daily as needed for anxiety.   Yes [provider]  indapamide (LOZOL) 1.25 MG tablet Take 1.25 mg by mouth See admin instructions. Take 1/2 tablet daily 04/09/20  Yes [provider]  levETIRAcetam (KEPPRA) 500 MG tablet Take 1 tablet (500 mg total) by mouth 2 (two) times daily. 05/27/20  Yes Penumalli, Earlean Polka, MD  losartan (COZAAR) 25 MG tablet Take 25 mg by mouth daily. 05/16/20  Yes [provider]  omeprazole  (PRILOSEC) 40 MG capsule Take 1 capsule (40 mg total) by mouth in the morning and at bedtime. 11/20/19  Yes Spero Geralds, MD  testosterone cypionate (DEPOTESTOSTERONE CYPIONATE) 200 MG/ML injection Inject 200 mg into the muscle every 30 (thirty) days. 05/01/20  Yes [provider]  zolpidem (AMBIEN) 10 MG tablet Take 10  mg by mouth at bedtime.  05/01/19  Yes [provider]     Allergies:    No Known Allergies   Physical Exam:   Vitals  Blood pressure 112/80, pulse 62, temperature 98.1 F (36.7 C), temperature source Oral, resp. rate 16, height 5\' 7"  (1.702 m), weight 95 kg, SpO2 96 %.  1.  General: Patient lying supine in bed in no acute distress  2. Psychiatric: Mood and behavior normal for situation patient alert and oriented x3, pleasant and cooperative with exam  3. Neurologic: Face is symmetric, speech and language are normal, moves all 4 extremities voluntarily, no focal deficits on limited exam  4. HEENMT:  Head is atraumatic, normocephalic, pupils are reactive to light, neck is supple, trachea is midline, mucous membranes are moist  5. Respiratory : Lungs are clear to auscultation bilateral without wheezes, rhonchi, rales, no increased work of breathing or accessory muscle use  6. Cardiovascular : Heart rate is normal, rhythm is regular, no murmurs rubs or gallops  7. Gastrointestinal:  Abdomen is soft, nondistended, nontender to palpation  8. Skin:  Skin is warm dry and intact without acute lesion on limited exam  9.Musculoskeletal:  No calf tenderness, no acute deformity, peripheral pulses palpated    Data Review:    CBC Recent Labs  Lab 05/26/20 0512 05/30/20 2019  WBC 4.2 5.8  HGB 13.3 13.9  HCT 39.5 41.3  PLT 272 289  MCV 88.2 87.1  MCH 29.7 29.3  MCHC 33.7 33.7  RDW 13.3 13.2  LYMPHSABS 1.7 2.1  MONOABS 0.4 0.4  EOSABS 0.2 0.1  BASOSABS 0.1 0.1    ------------------------------------------------------------------------------------------------------------------  Results for orders placed or performed during the hospital encounter of 05/30/20 (from the past 48 hour(s))  CBC with Differential/Platelet     Status: None   Collection Time: 05/30/20  8:19 PM  Result Value Ref Range   WBC 5.8 4.0 - 10.5 K/uL   RBC 4.74 4.22 - 5.81 MIL/uL   Hemoglobin 13.9 13.0 - 17.0 g/dL   HCT 41.3 39.0 - 52.0 %   MCV 87.1 80.0 - 100.0 fL   MCH 29.3 26.0 - 34.0 pg   MCHC 33.7 30.0 - 36.0 g/dL   RDW 13.2 11.5 - 15.5 %   Platelets 289 150 - 400 K/uL   nRBC 0.0 0.0 - 0.2 %   Neutrophils Relative % 53 %   Neutro Abs 3.0 1.7 - 7.7 K/uL   Lymphocytes Relative 37 %   Lymphs Abs 2.1 0.7 - 4.0 K/uL   Monocytes Relative 7 %   Monocytes Absolute 0.4 0.1 - 1.0 K/uL   Eosinophils Relative 2 %   Eosinophils Absolute 0.1 0.0 - 0.5 K/uL   Basophils Relative 1 %   Basophils Absolute 0.1 0.0 - 0.1 K/uL   Immature Granulocytes 0 %   Abs Immature Granulocytes 0.02 0.00 - 0.07 K/uL    Comment: Performed at [facility], [address], [place], [place] [address]  Comprehensive metabolic panel     Status: None   Collection Time: 05/30/20  8:19 PM  Result Value Ref Range   Sodium 138 135 - 145 mmol/L   Potassium 3.5 3.5 - 5.1 mmol/L   Chloride 102 98 - 111 mmol/L   CO2 27 22 - 32 mmol/L   Glucose, Bld 90 70 - 99 mg/dL    Comment: Glucose reference range applies only to samples taken after fasting for at least 8 hours.   BUN 17 6 - 20  mg/dL   Creatinine, Ser 1.00 0.61 - 1.24 mg/dL   Calcium 8.9 8.9 - 10.3 mg/dL   Total Protein 6.9 6.5 - 8.1 g/dL   Albumin 4.4 3.5 - 5.0 g/dL   AST 22 15 - 41 U/L   ALT 25 0 - 44 U/L   Alkaline Phosphatase 84 38 - 126 U/L   Total Bilirubin 0.8 0.3 - 1.2 mg/dL   GFR, Estimated >60 >60 mL/min    Comment: (NOTE) Calculated using the CKD-EPI Creatinine Equation (2021)    Anion gap 9 5 - 15    Comment: Performed at [facility], [address], [place], [place] [address]  Troponin I (High Sensitivity)     Status: None   Collection Time: 05/30/20  8:19 PM  Result Value Ref Range   Troponin I (High Sensitivity) 2 <18 ng/L    Comment: (NOTE) Elevated high sensitivity troponin I (hsTnI) values and significant  changes across serial measurements may suggest ACS but many other  chronic and acute conditions are known to elevate hsTnI results.  Refer to the "Links" section for chest pain algorithms and additional  guidance. Performed at [facility], [address], [place], [place] [address]   Troponin I (High Sensitivity)     Status: None   Collection Time: 05/30/20 10:22 PM  Result Value Ref Range   Troponin I (High Sensitivity) 3 <18 ng/L    Comment: (NOTE) Elevated high sensitivity troponin I (hsTnI) values and significant  changes across serial measurements may suggest ACS but many other  chronic and acute conditions are known to elevate hsTnI results.  Refer to the "Links" section for chest pain algorithms and additional  guidance. Performed at [facility], [address] [place],  [address]     Chemistries  Recent Labs  Lab 05/26/20 [phone] 05/30/20 2019  NA 139 138  K 3.2* 3.5  CL 103 102  CO2 29 27  GLUCOSE 112* 90  BUN 12 17  CREATININE 1.06 1.00  CALCIUM 8.9 8.9  MG 2.1  --   AST 26 22  ALT 25 25  ALKPHOS 76 84  BILITOT 0.8 0.8   ------------------------------------------------------------------------------------------------------------------  ------------------------------------------------------------------------------------------------------------------ GFR: Estimated Creatinine Clearance: 95 mL/min (by C-G formula based on SCr of 1 mg/dL). Liver Function Tests: Recent Labs  Lab 05/26/20 0512 05/30/20 2019  AST 26 22  ALT 25 25  ALKPHOS 76 84  BILITOT 0.8 0.8  PROT 6.0* 6.9  ALBUMIN 3.7 4.4   No results for input(s): LIPASE, AMYLASE in the last 168 hours. No  results for input(s): AMMONIA in the last 168 hours. Coagulation Profile: No results for input(s): INR, PROTIME in the last 168 hours. Cardiac Enzymes: No results for input(s): CKTOTAL, CKMB, CKMBINDEX, TROPONINI in the last 168 hours. BNP (last 3 results) No results for input(s): PROBNP in the last 8760 hours. HbA1C: No results for input(s): HGBA1C in the last 72 hours. CBG: Recent Labs  Lab 05/26/20 0301  GLUCAP 107*   Lipid Profile: No results for input(s): CHOL, HDL, LDLCALC, TRIG, CHOLHDL, LDLDIRECT in the last 72 hours. Thyroid Function Tests: No results for input(s): TSH, T4TOTAL, FREET4, T3FREE, THYROIDAB in the last 72 hours. Anemia Panel: No results for input(s): VITAMINB12, FOLATE, FERRITIN, TIBC, IRON, RETICCTPCT in the last 72 hours.  --------------------------------------------------------------------------------------------------------------- Urine analysis: No results found for: COLORURINE, APPEARANCEUR, LABSPEC, PHURINE, GLUCOSEU, HGBUR, BILIRUBINUR, KETONESUR, PROTEINUR, UROBILINOGEN, NITRITE, LEUKOCYTESUR    Imaging Results:    No results found.     Assessment &  Plan:    Principal Problem:   Observed seizure-like activity ([place]) Active Problems:   CAD (coronary artery disease)   Anxiety   Essential hypertension   1. Seizure-like activity 1. History is consistent with nonepileptic form seizure-like activity, will send to [facility] for continuous EEG 2. Keppra load given in the ED, continue patient's home Keppra dosing going forward 3. Consult neuro upon arrival to [facility] 4. Neurochecks, seizure precautions 5. Ativan ordered for as needed seizure 6. Continue to monitor 2. Coronary artery disease 1. Continue aspirin, statin, ARB 3. Hypertension 1. Continue ARB 4. Anxiety 1. Continue Klonopin as needed   DVT Prophylaxis-Heparin- SCDs   AM Labs Ordered, also please review Full Orders  Family Communication: No family at bedside  Code  Status: Full  Admission status: Observation Time spent in minutes : [place] DO

## 2020-06-01 DIAGNOSIS — I2510 Atherosclerotic heart disease of native coronary artery without angina pectoris: Secondary | ICD-10-CM | POA: Diagnosis present

## 2020-06-01 DIAGNOSIS — D860 Sarcoidosis of lung: Secondary | ICD-10-CM | POA: Diagnosis present

## 2020-06-01 DIAGNOSIS — R569 Unspecified convulsions: Secondary | ICD-10-CM | POA: Diagnosis present

## 2020-06-01 DIAGNOSIS — Z20822 Contact with and (suspected) exposure to covid-19: Secondary | ICD-10-CM | POA: Diagnosis present

## 2020-06-01 DIAGNOSIS — F419 Anxiety disorder, unspecified: Secondary | ICD-10-CM | POA: Diagnosis present

## 2020-06-01 DIAGNOSIS — K219 Gastro-esophageal reflux disease without esophagitis: Secondary | ICD-10-CM | POA: Diagnosis present

## 2020-06-01 DIAGNOSIS — Z7982 Long term (current) use of aspirin: Secondary | ICD-10-CM | POA: Diagnosis not present

## 2020-06-01 DIAGNOSIS — E785 Hyperlipidemia, unspecified: Secondary | ICD-10-CM | POA: Diagnosis present

## 2020-06-01 DIAGNOSIS — Z79899 Other long term (current) drug therapy: Secondary | ICD-10-CM | POA: Diagnosis not present

## 2020-06-01 DIAGNOSIS — Z87820 Personal history of traumatic brain injury: Secondary | ICD-10-CM | POA: Diagnosis not present

## 2020-06-01 DIAGNOSIS — Z8249 Family history of ischemic heart disease and other diseases of the circulatory system: Secondary | ICD-10-CM | POA: Diagnosis not present

## 2020-06-01 DIAGNOSIS — I10 Essential (primary) hypertension: Secondary | ICD-10-CM | POA: Diagnosis present

## 2020-06-01 NOTE — Procedures (Addendum)
Patient Name: Mathew Moore  MRN: [id number]  Epilepsy Attending: Lora Havens  Referring Physician/Provider: Dr Donnetta Simpers Duration: 05/31/2020 1030 to 06/01/2020 1030  Patient history: 55 y.o. male with new onset seizure like events and multiple visits to the ED in the last 2 weeks. He has spells involving starring off along with events with GTCs. Minimal to no post ictal period and in some of the events, he is somewhat aware of what it going on. EEG to evaluate for seizure  Level of alertness: Awake, asleep  AEDs during EEG study: LEV  Technical aspects: This EEG study was done with scalp electrodes positioned according to the 10-20 International system of electrode placement. Electrical activity was acquired at a sampling rate of 500Hz  and reviewed with a high frequency filter of 70Hz  and a low frequency filter of 1Hz . EEG data were recorded continuously and digitally stored.   Description: The posterior dominant rhythm consists of 10 Hz activity of moderate voltage (25-35 uV) seen predominantly in posterior head regions, symmetric and reactive to eye opening and eye closing. Sleep was characterized by vertex waves, sleep spindles (12 to 14 Hz), maximal frontocentral region. Hyperventilation and photic stimulation were not performed.     Wife reported two staring spells between 1800-1830 on 05/31/2020. Concomitant eeg before, during and after the event didn't show any eeg change to suggest seizure.  IMPRESSION: This study is within normal limits. No seizures or epileptiform discharges were seen throughout the recording.  Wife reported two staring spells between 1800-1830 on 05/31/2020 without concomitant eeg change and were NOT epileptic events/  Chirstopher Iovino Barbra Sarks

## 2020-06-01 NOTE — Progress Notes (Signed)
PROGRESS NOTE    Mathew Moore  [id number] DOB: 1965-06-23 DOA: 05/30/2020 PCP: Manon Hilding, MD    Brief Narrative:  Mathew Moore  is a 55 y.o. male, with history of sarcoidosis, hypertension, GERD, hyperlipidemia presents the ED with a chief complaint of seizure.  Patient has been being worked up for seizure-like activity.  He recently started Keppra 4 days ago, and reports no missed doses.  He reports that today he had 2 seizures.  He reports he was sitting watching TV talking with his wife, when he heard his wife say what is wrong and he felt a blank.  He reports that he started breathing hard and then his whole body started shaking, he could hear and understand what was going on around him but mostly forgets what happened.  He reports that he is oriented upon coming out of the episode.  There is no postictal state.  He reports that in the past he has had urinary incontinence during his seizure-like activity, but that did not happen today.  He reports that he did bite his tongue.  After first episode he did have a headache as well.  He reports that his second episode which was very similar to the first 1, he fell out of the recliner onto the ground.  Patient denies any fevers or cough.  He reports that he does not have dyspnea during the episode.  He reports that he has had palpitations, nausea recently.  He denies any belly pain, dysuria, diarrhea.  He denies any skin changes.  Patient has no other complaints at this time.  5/1 which EEG machine.  Wife at bedside.pt reports no seziures occurred.  Consultants:   Neurology  Procedures: EEG  Antimicrobials:       Subjective: No chest pain dizziness  Objective: Vitals:   06/01/20 0400 06/01/20 0500 06/01/20 0600 06/01/20 0730  BP: 133/83   114/85  Pulse: (!) 56 (!) 58 60 61  Resp: 15 16 (!) 22 16  Temp: 98.4 F (36.9 C)   97.8 F (36.6 C)  TempSrc: Oral   Oral  SpO2: 96% 94%  97%  Weight:      Height:         Intake/Output Summary (Last 24 hours) at 06/01/2020 1329 Last data filed at 06/01/2020 0600 Gross per 24 hour  Intake 1881.64 ml  Output 600 ml  Net 1281.64 ml   Filed Weights   05/30/20 1942  Weight: 95 kg    Examination:  General exam: Appears calm and comfortable  Respiratory system: Clear to auscultation. Respiratory effort normal. Cardiovascular system: S1 & S2 heard, RRR. No JVD, murmurs, rubs, gallops or clicks.  Gastrointestinal system: Abdomen is nondistended, soft and nontender.  Normal bowel sounds heard. Central nervous system: Alert and oriented. No focal neurological deficits. Extremities: No edema Psychiatry: Judgement and insight appear normal. Mood & affect appropriate.     Data Reviewed: I have personally reviewed following labs and imaging studies  CBC: Recent Labs  Lab 05/26/20 0512 05/30/20 2019 05/31/20 0342  WBC 4.2 5.8 5.4  NEUTROABS 1.9 3.0  --   HGB 13.3 13.9 13.8  HCT 39.5 41.3 40.3  MCV 88.2 87.1 84.7  PLT 272 289 [id number]   Basic Metabolic Panel: Recent Labs  Lab 05/26/20 0512 05/30/20 2019 05/31/20 0342  NA 139 138 141  K 3.2* 3.5 4.0  CL 103 102 103  CO2 29 27 31   GLUCOSE 112* 90 98  BUN 12 17 15  CREATININE 1.06 1.00 1.15  CALCIUM 8.9 8.9 8.9  MG 2.1  --  2.0   GFR: Estimated Creatinine Clearance: 82.6 mL/min (by C-G formula based on SCr of 1.15 mg/dL). Liver Function Tests: Recent Labs  Lab 05/26/20 0512 05/30/20 2019 05/31/20 0342  AST 26 22 21   ALT 25 25 23   ALKPHOS 76 84 76  BILITOT 0.8 0.8 0.9  PROT 6.0* 6.9 6.2*  ALBUMIN 3.7 4.4 4.0   No results for input(s): LIPASE, AMYLASE in the last 168 hours. No results for input(s): AMMONIA in the last 168 hours. Coagulation Profile: No results for input(s): INR, PROTIME in the last 168 hours. Cardiac Enzymes: No results for input(s): CKTOTAL, CKMB, CKMBINDEX, TROPONINI in the last 168 hours. BNP (last 3 results) No results for input(s): PROBNP in the last 8760  hours. HbA1C: No results for input(s): HGBA1C in the last 72 hours. CBG: Recent Labs  Lab 05/26/20 0301  GLUCAP 107*   Lipid Profile: No results for input(s): CHOL, HDL, LDLCALC, TRIG, CHOLHDL, LDLDIRECT in the last 72 hours. Thyroid Function Tests: No results for input(s): TSH, T4TOTAL, FREET4, T3FREE, THYROIDAB in the last 72 hours. Anemia Panel: No results for input(s): VITAMINB12, FOLATE, FERRITIN, TIBC, IRON, RETICCTPCT in the last 72 hours. Sepsis Labs: No results for input(s): PROCALCITON, LATICACIDVEN in the last 168 hours.  Recent Results (from the past 240 hour(s))  SARS CORONAVIRUS 2 (TAT 6-24 HRS) Nasopharyngeal Nasopharyngeal Swab     Status: None   Collection Time: 05/30/20 11:45 PM   Specimen: Nasopharyngeal Swab  Result Value Ref Range Status   SARS Coronavirus 2 NEGATIVE NEGATIVE Final    Comment: (NOTE) SARS-CoV-2 target nucleic acids are NOT DETECTED.  The SARS-CoV-2 RNA is generally detectable in upper and lower respiratory specimens during the acute phase of infection. Negative results do not preclude SARS-CoV-2 infection, do not rule out co-infections with other pathogens, and should not be used as the sole basis for treatment or other patient management decisions. Negative results must be combined with clinical observations, patient history, and epidemiological information. The expected result is Negative.  Fact Sheet for Patients: SugarRoll.be  Fact Sheet for Healthcare Providers: https://www.woods-mathews.com/  This test is not yet approved or cleared by the [place] FDA and  has been authorized for detection and/or diagnosis of SARS-CoV-2 by FDA under an Emergency Use Authorization (EUA). This EUA will remain  in effect (meaning this test can be used) for the duration of the COVID-19 declaration under Se ction 564(b)(1) of the Act, 21 U.S.C. section 360bbb-3(b)(1), unless the authorization is  terminated or revoked sooner.  Performed at [facility], [place] [address], [place], [place] [address]   MRSA PCR Screening     Status: None   Collection Time: 05/31/20  3:35 AM   Specimen: Nasal Mucosa; Nasopharyngeal  Result Value Ref Range Status   MRSA by PCR NEGATIVE NEGATIVE Final    Comment:        The GeneXpert MRSA Assay (FDA approved for NASAL specimens only), is one component of a comprehensive MRSA colonization surveillance program. It is not intended to diagnose MRSA infection nor to guide or monitor treatment for MRSA infections. Performed at [facility], [place] [address], [place], [place] [address]          Radiology Studies: Overnight EEG with video  Result Date: 06/01/2020 Lora Havens, MD     06/01/2020 10:56 AM Patient Name: Mathew Moore MRN: [id number] Epilepsy Attending: Cecil Cranker  Hortense Ramal Referring Physician/Provider: Dr Donnetta Simpers Duration: 05/31/2020 1030 to 06/01/2020 1030 Patient history: 55 y.o. male with new onset seizure like events and multiple visits to the ED in the last 2 weeks. He has spells involving starring off along with events with GTCs. Minimal to no post ictal period and in some of the events, he is somewhat aware of what it going on. EEG to evaluate for seizure Level of alertness: Awake, asleep AEDs during EEG study: LEV Technical aspects: This EEG study was done with scalp electrodes positioned according to the 10-20 International system of electrode placement. Electrical activity was acquired at a sampling rate of 500Hz  and reviewed with a high frequency filter of 70Hz  and a low frequency filter of 1Hz . EEG data were recorded continuously and digitally stored. Description: The posterior dominant rhythm consists of 10 Hz activity of moderate voltage (25-35 uV) seen predominantly in posterior head regions, symmetric and reactive to eye opening and eye closing. Sleep was characterized by vertex waves, sleep spindles (12 to 14  Hz), maximal frontocentral region. Hyperventilation and photic stimulation were not performed.   Wife reported two staring spells between 1800-1830 on 05/31/2020. Concomitant eeg before, during and after the event didn't show any eeg change to suggest seizure. IMPRESSION: This study is within normal limits. No seizures or epileptiform discharges were seen throughout the recording. Wife reported two staring spells between 1800-1830 on 05/31/2020 without concomitant eeg change and were NOT epileptic events/ Priyanka O Yadav        Scheduled Meds: . aspirin EC  81 mg Oral Daily  . atorvastatin  40 mg Oral Daily  . citalopram  20 mg Oral Daily  . heparin  5,000 Units Subcutaneous Q8H  . indapamide  0.625 mg Oral Daily  . levETIRAcetam  500 mg Oral BID  . losartan  25 mg Oral Daily  . pantoprazole  40 mg Oral Daily  . zolpidem  10 mg Oral QHS   Continuous Infusions: . sodium chloride 75 mL/hr (06/01/20 2956)    Assessment & Plan:   Principal Problem:   Observed seizure-like activity (HCC) Active Problems:   CAD (coronary artery disease)   Anxiety   Essential hypertension   1. 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 Coronary artery disease Continue aspirin, statin, beta-blocker   3. Hypertension Continue continue Rx 4. Anxiety Continue Klonopin as needed    DVT prophylaxis: Heparin Code Status: Full Family Communication: Wife at  bedside  Status is: Observation  The patient remains OBS appropriate and will d/c before 2 midnights.  Dispo: The patient is from: Home              Anticipated d/c is to: Home              Patient currently is not medically stable to d/c.   Difficult to place patient No            LOS: 0 days   Time spent: 35 minutes with more than  50% on COC    Nolberto Hanlon, MD [facility] Pager [phone]  If 7PM-7AM, please contact night-coverage 06/01/2020, 1:29 PM

## 2020-06-01 NOTE — Progress Notes (Addendum)
Neurology Progress Note  Patient ID: ISIDOR BROMELL is a 55 y.o. with PMHx of  has a past medical history of Anxiety, Atypical mole (08/24/2019), GERD (gastroesophageal reflux disease), Hypertension, and Sarcoidosis (07/2019).  Initially consulted for: episodes concerning for seizure     Subjective: Patient and wife report two episodes of staring into space and loss of time occurring 05/31/20 in the evening around 6-7pm. No events captured on EEG.   Exam: Vitals:   06/01/20 0600 06/01/20 0730  BP:  114/85  Pulse: 60 61  Resp: (!) 22 16  Temp:  97.8 F (36.6 C)  SpO2:  97%   Gen: In bed, comfortable. Wife at bedside providing support.  Resp: non-labored breathing, no grossly audible wheezing Cardiac: Perfusing extremities well  Abd: soft, nt Neuro:  Alert, oriented to self, place, month and year, good attention.  Speech/language: Fluent, comprehension intact, object naming intact, repetition intact.  Cranial nerves:   CN II Pupils equal and reactive to light, no VF deficits    CN III,IV,VI EOM intact, no gaze preference or deviation, no nystagmus    CN V normal sensation in V1, V2, and V3 segments bilaterally    CN VII no asymmetry, no nasolabial fold flattening    CN VIII normal hearing to speech    CN IX & X normal palatal elevation, no uvular deviation    CN XI 5/5 head turn and 5/5 shoulder shrug bilaterally    CN XII midline tongue protrusion     Motor: 5/5 throughout all extremities  Sensory:intac to light touch. DTR: normal at bilateral biceps and patellar areas.   Pertinent Labs:  Results for CHIN, WACHTER (MRN [id number]) as of 06/01/2020 10:45  Ref. Range 05/31/2020 03:42  Sodium Latest Ref Range: 135 - 145 mmol/L 141  Potassium Latest Ref Range: 3.5 - 5.1 mmol/L 4.0  Chloride Latest Ref Range: 98 - 111 mmol/L 103  CO2 Latest Ref Range: 22 - 32 mmol/L 31  Glucose Latest Ref Range: 70 - 99 mg/dL 98  BUN Latest Ref Range: 6 - 20 mg/dL 15  Creatinine Latest  Ref Range: 0.61 - 1.24 mg/dL 1.15  Calcium Latest Ref Range: 8.9 - 10.3 mg/dL 8.9  Anion gap Latest Ref Range: 5 - 15  7  Magnesium Latest Ref Range: 1.7 - 2.4 mg/dL 2.0  Alkaline Phosphatase Latest Ref Range: 38 - 126 U/L 76  Albumin Latest Ref Range: 3.5 - 5.0 g/dL 4.0  AST Latest Ref Range: 15 - 41 U/L 21  ALT Latest Ref Range: 0 - 44 U/L 23  Total Protein Latest Ref Range: 6.5 - 8.1 g/dL 6.2 (L)  Total Bilirubin Latest Ref Range: 0.3 - 1.2 mg/dL 0.9  GFR, Estimated Latest Ref Range: >60 mL/min >60  WBC Latest Ref Range: 4.0 - 10.5 K/uL 5.4  RBC Latest Ref Range: 4.22 - 5.81 MIL/uL 4.76  Hemoglobin Latest Ref Range: 13.0 - 17.0 g/dL 13.8  HCT Latest Ref Range: 39.0 - 52.0 % 40.3  MCV Latest Ref Range: 80.0 - 100.0 fL 84.7  MCH Latest Ref Range: 26.0 - 34.0 pg 29.0  MCHC Latest Ref Range: 30.0 - 36.0 g/dL 34.2  RDW Latest Ref Range: 11.5 - 15.5 % 13.2  Platelets Latest Ref Range: 150 - 400 K/uL 266    Impression: 55 year old male admitted with multiple episodes concerning for seizure. Continuous EEG during this hospitalizations does not capure any spells. On keppra 500mg  twice daily this hospitaliation. Was on keppra 1gm bid  at home.   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 We will continue to follow .   Note written by Claiborne Billings PA and edited by me to reflect my findings and recommendations. Will d/c LEV and continue LTM to capture larger events resembling GTCs. Staring spells had no ictal correlate on EEG.  Su Monks, MD [facility] [phone]  If 7pm- 7am, please page neurology on call as listed in [place].

## 2020-06-02 MED ORDER — LAMOTRIGINE 25 MG PO TABS: ORAL_TABLET | ORAL | 2 refills | Status: DC

## 2020-06-02 NOTE — Procedures (Addendum)
Patient Name: SEVASTIAN WITCZAK  MRN: [id number]  Epilepsy Attending: Lora Havens  Referring Physician/Provider: Dr Donnetta Simpers Duration: 06/01/2020 1030 to 06/02/2020 1030  Patient history: 55 y.o.malewith new onset seizure like events and multiple visits to the ED in the last 2 weeks. He has spells involving starring off along with events with GTCs. Minimal to no post ictal period and in some of the events, he is somewhat aware of what it going on. EEG to evaluate for seizure  Level of alertness: Awake, asleep  AEDs during EEG study: LEV  Technical aspects: This EEG study was done with scalp electrodes positioned according to the 10-20 International system of electrode placement. Electrical activity was acquired at a sampling rate of 500Hz  and reviewed with a high frequency filter of 70Hz  and a low frequency filter of 1Hz . EEG data were recorded continuously and digitally stored.   Description: The posterior dominant rhythm consists of 10 Hz activity of moderate voltage (25-35 uV) seen predominantly in posterior head regions, symmetric and reactive to eye opening and eye closing. Sleep was characterized by vertex waves, sleep spindles (12 to 14 Hz), maximal frontocentral region. Hyperventilation and photic stimulation were not performed.     IMPRESSION: This study is within normal limits. No seizures or epileptiform discharges were seen throughout the recording.  Colleena Kurtenbach Barbra Sarks

## 2020-06-02 NOTE — Progress Notes (Signed)
Subjective: No acute events overnight.  No further events overnight.  Patient's wife at bedside.  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  Patient's wife lost her mother about a year ago and he has been helping her with that.  He was also at home from work for a few months, return to work just a few months ago.  These episodes started happening about a month ago.  ROS: negative except above  Examination  Vital signs in last 24 hours: Temp:  [98 F (36.7 C)-98.4 F (36.9 C)] 98.2 F (36.8 C) (05/02 0843) Pulse Rate:  [54-71] 65 (05/02 0843) Resp:  [12-21] 20 (05/02 0843) BP: (107-134)/(71-81) 118/71 (05/02 0843) SpO2:  [91 %-97 %] 97 % (05/02 0843)  General: lying in bed, not in apparent distress CVS: pulse-normal rate and rhythm RS: breathing comfortably Extremities: normal  Neuro: AOx3, cranial nerves 2-12 grossly intact, spontaneously moving all 4 extremities  Basic Metabolic Panel: Recent Labs  Lab 05/30/20 2019 05/31/20 0342  NA 138 141  K 3.5 4.0  CL 102 103  CO2 27 31  GLUCOSE 90 98  BUN 17 15  CREATININE 1.00 1.15  CALCIUM 8.9 8.9  MG  --  2.0    CBC: Recent Labs  Lab 05/30/20 2019 05/31/20 0342  WBC 5.8 5.4  NEUTROABS 3.0  --   HGB 13.9 13.8  HCT 41.3 40.3  MCV 87.1 84.7  PLT 289 266     Coagulation Studies: No results for input(s): LABPROT, INR in the last 72 hours.  Imaging  MRI brain with and without contrast 05/14/2020: Normal for age MRI appearance of the brain. No acute intracranial abnormality.  ASSESSMENT AND PLAN: 55 year old male with episodes of staring, whole body shaking.   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  Patient was laying on phone on the floor, nonrhythmic whole-body twitching.  He has been on continuous video monitoring for 48 hours without any ictal or interictal abnormality.  Patient and his wife  described multiple stressors.  Patient's history, semiology of the episodes and EEG findings are highly suggestive of nonepileptic spells.  Recommendations - Discussed diagnosis of nonepileptic spells with patient and wife at bedside. They both expressed agreement -Okay to discontinue Keppra -Start patient on lamotrigine.  Discussed side effects including rash, dizziness.  Lamotrigine Morning Evening  Week 1 25mg    Week 2 25mg  25mg   Week 3 50mg  25mg   Week 4 50mg  50mg    -Recommend follow-up with Dr. Leta Baptist at [facility] in 8 to 10 weeks -Recommend seizure precautions including do not drive for 6 months.  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  Non-Epileptic Seizures, Adult A non-epileptic seizure is an event that can cause abnormal movements or a loss of consciousness. These events differ from epileptic seizures because they are not caused by abnormal electrical and chemical activity in the brain. There are two types of non-epileptic seizures:  Physiologic. This type results from an underlying problem with body function.  Psychogenic. This type results from an underlying mental health disorder. What are the causes? The cause of this condition depends on the kind of non-epileptic seizure that you have. Causes of physiologic non-epileptic seizures  Sudden drop in blood pressure or heart rhythm problems.  Low blood sugar (glucose) or low levels of salt (sodium) in your blood.  Migraine.  Sleep disorders or movement disorders.  Certain medicines.  Heavy use of drugs or alcohol.  Head injuries. Causes of psychogenic non-epileptic seizures  Stress.  Emotional trauma.  Sexual or physical abuse.  Big life events, such as divorce or death of a loved one.  Mental health disorders, including anxiety and depression. What are the signs or symptoms? Symptoms of a non-epileptic seizure can be similar to those of an  epileptic seizure. They may include:  A change in attention or behavior.  A loss of consciousness or fainting.  Uncontrollable shaking (convulsions) with fast, jerking movements.  Drooling, tongue biting, or grunting.  Rapid eye movements.  Being unable to control when you urinate or have bowel movements. After a non-epileptic seizure, you may:  Have a headache or sore muscles.  Feel confused or sleepy. Non-epileptic seizures usually:  Do not cause physical injuries.  Start slowly.  Include crying or shrieking.  Last longer than 2 minutes.  Include pelvic thrusting. How is this diagnosed? Non-epileptic seizures may be diagnosed by medical history, physical exam, and symptoms. Your health care provider may:  Talk with your friends or relatives who have seen you have a seizure.  Ask you to write down your seizure activity and the things that led up to the seizure, and ask you to share that information with him or her. You may also have tests to look for causes of physiologic non-epileptic seizures. Tests may include:  An electroencephalogram (EEG) to check the electrical activity in your brain.  Video EEG in the hospital. This takes 2-7 days.  Blood tests.  An electrocardiogram (ECG) to check for an abnormal heart rhythm.  A CT scan. If your health care provider thinks you have had a psychogenic non-epileptic seizure, you may need to be evaluated by a mental health specialist.   How is this treated? The treatment for your non-epileptic seizures will depend on their cause. When the underlying condition is treated, your non-epileptic seizures should stop. Medicines to treat seizures do not help with non-epileptic events. If your non-epileptic seizures are being caused by emotional trauma or stress, your health care provider may recommend that you see a mental health specialist. Treatment may include:  Relaxation therapy or cognitive behavioral therapy  (CBT).  Medicines for depression or anxiety.  Individual or family counseling. Some people have both psychogenic non-epileptic seizures and epileptic seizures. If you have both of these types, you may be prescribed medicine to manage the epileptic seizures. Follow these instructions at home: Home care will depend on the type of non-epileptic seizures that you have. Follow this general guidance: Avoid alcohol and drug use Avoid using any substance that may prevent your medicine from working properly. If you are prescribed medicine for seizures:  Do not use drugs.  Limit or avoid drinking alcohol. If you drink alcohol: ? Limit how much you have to:  0-1 drink a day for women who are not pregnant.  0-2 drinks a day for men. ? Know how much alcohol is in your drink. In the [place] one drink equals one 12 oz bottle of beer (355 mL), one 5 oz glass of wine (148 mL), or one 1 oz glass of hard liquor (44 mL). Involve family, friends, and coworkers Make sure family members, friends, and coworkers are trained in how to help you if you have a seizure. If you have a seizure, they should:  Lay you on the ground to prevent a fall.  Place a pillow or piece of clothing  under your head.  Loosen any clothing around your neck.  Turn you onto your side. If you vomit, this position will help keep your windpipe clear. General instructions  Follow all instructions from your health care provider. These may include ways to prevent seizures and what to do if you have a seizure.  Take over-the-counter and prescription medicines only as told by your health care provider.  Keep all follow-up visits. This is important. Contact a health care provider if:  Your non-epileptic seizures change or happen more often.  Your non-epileptic seizures do not stop after treatment. Get help right away if:  You injure yourself during a non-epileptic seizure.  You have one non-epileptic seizure after another.  You  have trouble recovering from a non-epileptic seizure.  You have chest pain or trouble breathing.  You have a non-epileptic seizure that lasts longer than 5 minutes. These symptoms may represent a serious problem that is an emergency. Do not wait to see if the symptoms will go away. Get medical help right away. Call your local emergency services (911 in the [place] Do not drive yourself to the hospital. If you ever feel like you may hurt yourself or others, or have thoughts about taking your own life, get help right away. Go to your nearest emergency department or:  Call your local emergency services (911 in the [place]  Call a suicide crisis helpline, such as the [place] at 315-761-9130. This is open 24 hours a day in the [place]  Text the Crisis Text Line at 732 169 8139 (in the [place].). Summary  Non-epileptic seizures are events that can cause abnormal movements or a loss of consciousness. These events are caused by an underlying problem with body function or a mental health disorder.  The treatment for your non-epileptic seizures will depend on their cause. When the underlying condition is treated, your non-epileptic seizures should stop. Medicines for seizures do not treat non-epileptic events.  People with non-epileptic seizures may also have epileptic seizures and may need medicines to treat seizures.  Make sure family members, friends, and coworkers are trained in how to help you if you have a non-epileptic seizure. This includes laying you on the ground to prevent a fall, protecting your head and neck, and turning you onto your side. This information is not intended to replace advice given to you by your health care provider. Make sure you discuss any questions you have with your health care provider. Document Revised: 07/06/2019 Document Reviewed: 07/06/2019 Elsevier Patient Education  [place].   Managing Non-Epileptic Seizures, Adult A non-epileptic  seizure is an event that can cause abnormal movements or a loss of consciousness. It is not caused by abnormal electrical and chemical activity in the brain (epileptic seizure). Non-epileptic seizures are caused by an underlying problem with body function (physiologic) or a mental health disorder (psychogenic). If you have been diagnosed with non-epileptic seizures, you can do things to manage your symptoms. You may have to try different things to see what works best for you. Your health care provider may also give you specific instructions. How does this condition affect me? If your seizures are physiologic, your health care provider will treat the cause. These seizures are not likely to return, and they do not need further treatment. If you have psychogenic non-epileptic seizures (PNES), it is very important to understand that your PNES is a real illness. Your seizures are not fake. They just have a different cause than other seizures. PNES can  be treated. Work with your mental health provider to find a treatment that works for you. Talk with your health care provider about what activities are safe to do until your seizures are controlled. What actions can I take to manage the condition? Create a plan for how to deal with your seizures.  After a seizure, make notes about what was associated with the stress that led to the seizure. Then, create a plan to manage this stress.  Think of ways to change the stresses you cannot avoid. How to manage lifestyle changes Managing stress  Certain types of counseling can be very helpful for managing stress. A mental health provider can assess what other treatments may also help you, such as: ? Talk therapy (cognitive behavioral therapy, or CBT). Through CBT, you will learn to identify and manage the psychological distress that leads to seizures. ? Medicine to treat depression or anxiety. ? Biofeedback. This uses signals from your body (physiologic responses)  to help you learn to regulate anxiety.  Consider self-care strategies to lower stress levels, such as: ? Doing breathing exercises, yoga, or meditation. ? Listening to music. ? Doing recreational therapy or organized exercise. ? Giving yourself calming messages. ? Calling a friend to talk about your stress.   Relationships You may benefit from talking with a trusted friend or family member about your thoughts and feelings.   General instructions Learn as much as you can about your non-epileptic seizures.  Consider educating the people in your life about your condition. These should be trusted family members and others who spend time with you at work, school, or home.  Ask for the emotional support you would like.  Keep all follow-up visits. This is important. Follow these instructions at home: [place] may be prescribed to treat depression or anxiety that causes non-epileptic seizures. Avoid using alcohol and other substances that may prevent your medicines from working properly (may interact). It is also important to:  Talk with your pharmacist or health care provider about all the medicines that you take, their possible side effects, and what medicines are safe to take together.  Make it your goal to take part in all treatment decisions (shared decision-making). Ask about possible side effects of medicines that your health care provider recommends, and tell him or her how you feel about having those side effects. It is best if shared decision-making with your health care provider is part of your total treatment plan.  Take over-the-counter and prescription medicines only as told by your health care provider. General instructions  Ask your health care provider if it is safe for you to drive.  Return to your normal activities as told by your health care provider. Ask your health care provider what activities are safe for you. These include working and playing  sports.  Follow all instructions from your health care provider. These may include ways to prevent seizures and what to do if you have a seizure.  Eat a balanced diet.  Make sure you get full nights of sleep and regular daily exercise.  Keep all follow-up visits. This is important. Where to find support You can get support for managing non-epileptic seizures from support groups, either online or in-person. Your health care provider may be able to recommend a support group in your area. Where to find more information  Epilepsy Foundation: epilepsy.com  American Epilepsy Society: aesnet.org Contact a health care provider if:  Your seizures change or happen more often.  You continue to have  seizures after treatment.  You show signs of depression or anxiety.  You have trouble doing your normal daily routine or work schedule. Get help right away if:  You injure yourself during a seizure.  You have one seizure after another.  You have trouble recovering from a seizure.  You have chest pain or trouble breathing.  A seizure lasts longer than 5 minutes.  You think about harming yourself or others. These symptoms may represent a serious problem that is an emergency. Do not wait to see if the symptoms will go away. Get medical help right away. Call your local emergency services (911 in the [place] Do not drive yourself to the hospital. If you ever feel like you may hurt yourself or others, or have thoughts about taking your own life, get help right away. Go to your nearest emergency department or:  Call your local emergency services (911 in the [place]  Call a suicide crisis helpline, such as the [place] at 325-019-5190. This is open 24 hours a day in the [place]  Text the Crisis Text Line at 5141218253 (in the [place].). Summary  The two types of non-epileptic seizures are physiologic non-epileptic seizures and psychogenic non-epileptic seizures.  Work with  your mental health provider to find a treatment that works for you.  Learning to recognize and avoid stress is an important part of preventing non-epileptic seizures. This information is not intended to replace advice given to you by your health care provider. Make sure you discuss any questions you have with your health care provider. Document Revised: 07/31/2019 Document Reviewed: 07/31/2019 Elsevier Patient Education  [place].  I have spent a total of  45  minutes with the patient reviewing hospital notes,  test results, labs and examining the patient as well as establishing an assessment and plan that was discussed personally with the patient and his wife.  > 50% of time was spent in direct patient care.    Zeb Comfort Epilepsy [facility] For questions after 5pm please refer to AMION to reach the Neurologist on call

## 2020-06-02 NOTE — Progress Notes (Signed)
Unhooked EEG, no skin break seen.

## 2020-06-02 NOTE — Discharge Summary (Signed)
[name]:[id number] DOB: [birth year] DOA: 05/30/2020  PCP: Manon Hilding, MD  Admit date: 05/30/2020 Discharge date: 06/02/2020  Admitted From: Home Disposition: Home  Recommendations for Outpatient Follow-up:  1. Follow up with PCP in 1 week 2. Please obtain BMP/CBC in one week 3. Follow-up with neurology in 8 weeks 4. No driving for 6 months 5.Work note given    Discharge Condition:Stable CODE STATUS:full  Diet recommendation: Heart Healthy  Brief/Interim Summary: Per JYN:Mathew Moore  is a 55 y.o. male, with history of sarcoidosis, hypertension, GERD, hyperlipidemia presents the ED with a chief complaint of seizure.  Patient has been being worked up for seizure-like activity.  He recently started Keppra 4 days ago, and reports no missed doses.  He reported that on admission today he had 2 seizures.  He reportd he was sitting watching TV talking with his wife, when he heard his wife say what is wrong and he felt a blank.  He reported that he started breathing hard and then his whole body started shaking, he could hear and understand what was going on around him but mostly forgets what happened. There was no postictal state.  Patient does not smoke, does not drink alcohol, does not use illicit drugs.  Patient is vaccinated for COVID.  Patient is full code.  In the ED Temperature 98.7, heart rate 62-69, respiratory rate 15-23, blood pressure 128/80, satting at 95% No leukocytosis, hemoglobin stable at 13.9, chemistry panel is unremarkable, troponin is 2, alcohol level is less than 10, UDS shows benzos-patient does have a prescription for Klonopin which he reports he takes 3 times a day every day. Keppra load of 1 g . Neuro consulted from the ED and recommends transfer to [facility] for continuous EEG  1.Seizure-like activity Neurology was consulted and was following.   Patient had EEG revealing slightly to be within normal limits.  No seizure or epileptiform discharges were  seen throughout the recording x2 days Neurology cleared patient for discharge today. Per neurology- felt to be Nonepileptic spells likely due to stressors.  Keppra was discontinued And he was started on lamotrigine-prescription was signed by neurology  Lamotrigine Morning Evening  Week 1 25mg    Week 2 25mg  25mg   Week 3 50mg  25mg   Week 4 50mg  50mg    -Recommend follow-up with Dr. Leta Baptist at [facility] in 8 to 10 weeks -Recommend seizure precautions including do not drive for 6 months.  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 Coronary artery disease Continue aspirin, statin, beta-blocker   3. Hypertension Continue home meds  4. Anxiety Continue Klonopin as needed    Discharge Diagnoses:  Principal Problem:   Observed seizure-like activity ([place]) Active Problems:   CAD (coronary artery disease)   Anxiety   Essential hypertension    Discharge Instructions  Discharge Instructions    Diet - low sodium heart healthy   Complete by: As directed    Discharge instructions   Complete by: As directed    F/u with pcp in one week F/u with neurology Dr. Leta Baptist in one week NO driving for 6 months or until otherwise your neurologist clears you   Increase activity slowly   Complete by: As directed      Allergies as of 06/02/2020   No Known Allergies     Medication List    STOP taking these medications   levETIRAcetam 500 MG tablet Commonly known as: Keppra  TAKE these medications   acetaminophen 500 MG tablet Commonly known as: TYLENOL Take 1,000 mg by mouth every 6 (six) hours as needed for moderate pain or headache.   albuterol 108 (90 Base) MCG/ACT inhaler Commonly known as: Ventolin HFA Inhale 1-2 puffs into the lungs every 6 (six) hours as needed for wheezing or shortness of breath.   aspirin EC 81 MG tablet Take 81 mg by mouth daily. Swallow whole.   atorvastatin 40 MG tablet Commonly  known as: LIPITOR Take 40 mg by mouth daily.   citalopram 20 MG tablet Commonly known as: CELEXA Take 20 mg by mouth daily.   clonazePAM 0.5 MG tablet Commonly known as: KLONOPIN Take 0.5 mg by mouth 3 (three) times daily as needed for anxiety.   indapamide 1.25 MG tablet Commonly known as: LOZOL Take 1.25 mg by mouth See admin instructions. Take 1/2 tablet daily   lamoTRIgine 25 MG tablet Commonly known as: LaMICtal 5/2-5/8: 25mg  QAM 5/9-5/15: 25mg  BID 5/16-5/22: 50mg  QAM and 25g qhs 5/23 onwards 50mg  BID Notes to patient: 1st week: 25 mg every morning 2nd week: 25mg  twice a day 3rd week: 50 mg every morning and 25 mg every night 4th week, onward: 50 mg twice a day   losartan 25 MG tablet Commonly known as: COZAAR Take 25 mg by mouth daily.   omeprazole 40 MG capsule Commonly known as: PRILOSEC Take 1 capsule (40 mg total) by mouth in the morning and at bedtime.   testosterone cypionate 200 MG/ML injection Commonly known as: DEPOTESTOSTERONE CYPIONATE Inject 200 mg into the muscle every 30 (thirty) days.   Vitamin D 50 MCG (2000 UT) Caps Take 2,000 Units by mouth daily.   zolpidem 10 MG tablet Commonly known as: AMBIEN Take 10 mg by mouth at bedtime.       Follow-up Information    Penumalli, Earlean Polka, MD Follow up in 7 week(s).   Specialties: Neurology, Radiology Contact information: [address] [place] [address] [phone]        Manon Hilding, MD Follow up in 1 week(s).   Specialty: Family Medicine Contact information: [place] [address] [phone]              No Known Allergies  Consultations:  Neurology   Procedures/Studies: CT Head Wo Contrast  Result Date: 05/07/2020 CLINICAL DATA:  Syncopal episode with head trauma. EXAM: CT HEAD WITHOUT CONTRAST TECHNIQUE: Contiguous axial images were obtained from the base of the skull through the vertex without intravenous contrast. COMPARISON:  06/01/2019  FINDINGS: Brain: No evidence of acute infarction, hemorrhage, hydrocephalus, extra-axial collection or mass lesion/mass effect. Vascular: No hyperdense vessel or unexpected calcification. Skull: Calvarium appears intact. Sinuses/Orbits: Mucosal thickening in the paranasal sinuses with near complete opacification of the left maxillary antrum. No acute air-fluid levels. Mastoid air cells are clear. Other: None. IMPRESSION: 1. No acute intracranial abnormalities. 2. Chronic paranasal sinus disease. Electronically Signed   By: Lucienne Capers M.D.   On: 05/07/2020 02:42   CT Angio Chest PE W and/or Wo Contrast  Result Date: 05/07/2020 CLINICAL DATA:  Pulmonary embolus suspected with high probability. Sudden onset of chest discomfort and nausea. Syncopal episode. Chest pain radiating down the left arm. EXAM: CT ANGIOGRAPHY CHEST WITH CONTRAST TECHNIQUE: Multidetector CT imaging of the chest was performed using the standard protocol during bolus administration of intravenous contrast. Multiplanar CT image reconstructions and MIPs were obtained to evaluate the vascular anatomy. CONTRAST:  75mL OMNIPAQUE IOHEXOL  350 MG/ML SOLN COMPARISON:  11/13/2019 FINDINGS: Cardiovascular: There is good opacification of the central and segmental pulmonary arteries. No focal filling defects. No evidence of significant pulmonary embolus. Normal caliber thoracic aorta. No aortic dissection. Heart size is normal. No pericardial effusions. Mild coronary artery calcification. Mediastinum/Nodes: Scattered lymph nodes in the mediastinum without pathologic enlargement. Largest subcarinal nodes measure about 12 mm short axis dimension. Likely reactive. Esophagus is decompressed. Lungs/Pleura: Mild dependent atelectasis in the lung bases. No airspace disease or consolidation. No pleural effusions. No pneumothorax. Airways are patent. Upper Abdomen: No acute abnormalities demonstrated in the visualized upper abdomen. Musculoskeletal: No chest  wall abnormality. No acute or significant osseous findings. Review of the MIP images confirms the above findings. IMPRESSION: No evidence of significant pulmonary embolus. No evidence of active pulmonary disease. Coronary artery calcification. Electronically Signed   By: Lucienne Capers M.D.   On: 05/07/2020 02:40   MR ANGIO HEAD WO CONTRAST  Result Date: 05/14/2020 CLINICAL DATA:  55 year old male with altered mental status, syncope, possible seizure activity. EXAM: MRA HEAD WITHOUT CONTRAST TECHNIQUE: Angiographic images of the Circle of Willis were obtained using MRA technique without intravenous contrast. COMPARISON:  Neck MRA, brain MRI without and with contrast today. FINDINGS: Antegrade flow in the posterior circulation with codominant distal vertebral arteries. Mildly fenestrated vertebrobasilar junction (normal variant). No distal vertebral stenosis. Normal left PICA origin. Right AICA appears dominant and patent. Patent basilar artery without stenosis. Normal SCA and PCA origins. Posterior communicating arteries are diminutive or absent. Bilateral PCA branches appear normal. Antegrade flow in both ICA siphons. No siphon stenosis. Normal ophthalmic artery origins. Normal carotid termini, MCA and ACA origins. Normal anterior communicating artery. The right ACA A2 is mildly dominant throughout, normal variant. Visible ACA branches are within normal limits. Left MCA M1 segment bifurcates early without stenosis. Right MCA M1 and bifurcation are patent without stenosis. Visible bilateral MCA branches are within normal limits. IMPRESSION: Normal intracranial MRA. Electronically Signed   By: Genevie Ann M.D.   On: 05/14/2020 05:29   MR ANGIO NECK W WO CONTRAST  Result Date: 05/14/2020 CLINICAL DATA:  55 year old male with altered mental status, syncope, possible seizure activity. EXAM: MRA NECK WITHOUT AND WITH CONTRAST TECHNIQUE: Multiplanar and multiecho pulse sequences of the neck were obtained without and  with intravenous contrast. Angiographic images of the neck were obtained using MRA technique without and with intravenous contrast. CONTRAST:  9.75mL GADAVIST GADOBUTROL 1 MMOL/ML IV SOLN in conjunction with contrast enhanced imaging of the brain reported separately. COMPARISON:  Brain MRI today reported separately. FINDINGS: Precontrast time-of-flight images demonstrate antegrade flow in both cervical carotid and vertebral arteries. Codominant vertebrals. Carotid bifurcations appear normal. Post-contrast neck MRA images reveal a 3 vessel arch configuration. The arch and proximal great vessels appear normal. Right CCA, right carotid bifurcation and cervical right ICA are normal. Left CCA, left carotid bifurcation and cervical left ICA are normal. Both proximal subclavian arteries and vertebral artery origins are normal. Codominant cervical vertebral arteries appear patent and normal to the skull base. Grossly normal visible anterior and posterior intracranial circulation. Grossly normal visible neck soft tissues. IMPRESSION: Normal Neck MRA. Electronically Signed   By: Genevie Ann M.D.   On: 05/14/2020 05:26   MR BRAIN W WO CONTRAST  Result Date: 05/14/2020 CLINICAL DATA:  55 year old male with altered mental status, syncope, possible seizure activity. EXAM: MRI HEAD WITHOUT AND WITH CONTRAST TECHNIQUE: Multiplanar, multiecho pulse sequences of the brain and surrounding structures were obtained without and  with intravenous contrast. CONTRAST:  9.704mL GADAVIST GADOBUTROL 1 MMOL/ML IV SOLN COMPARISON:  CT head without contrast 05/07/2020, 06/01/2019. FINDINGS: Brain: Normal cerebral volume. No restricted diffusion to suggest acute infarction. No midline shift, mass effect, evidence of mass lesion, ventriculomegaly, extra-axial collection or acute intracranial hemorrhage. Cervicomedullary junction and pituitary are within normal limits. On thin slice coronal imaging hippocampal formations and other mesial temporal lobe  structures appear symmetric and within normal limits. Cerebral gyral morphology appears within normal limits. No migrational abnormality identified. Wallace CullensGray and white matter signal is within normal limits for age throughout the brain. No cortical encephalomalacia or chronic cerebral blood products identified. Deep gray matter nuclei, brainstem and cerebellum are within normal limits. No abnormal enhancement identified.  No dural thickening. Vascular: Major intracranial vascular flow voids are preserved. The major dural venous sinuses are enhancing and appear to be patent. Skull and upper cervical spine: Negative visible cervical spine. Visualized bone marrow signal is within normal limits. Sinuses/Orbits: Negative orbits. Moderate left maxillary sinus mucosal thickening. Trace sinus mucosal thickening otherwise. No sinus fluid levels. Other: Mastoids are well aerated. Grossly normal visible internal auditory structures. Visible scalp and face appear negative. IMPRESSION: 1. Normal for age MRI appearance of the brain. No acute intracranial abnormality. 2. Moderate left maxillary sinus disease. Electronically Signed   By: Odessa FlemingH  Hall M.D.   On: 05/14/2020 05:24   EEG adult  Result Date: 05/14/2020 Lorrene ReidBoudreau, Michelle M, MD     05/14/2020  3:04 PM HISTORY:  55 year old male presents with syncope. INTRODUCTION:  A digital EEG was performed in the laboratory using the standard international 10/20 system of electrode placement in addition to one channel of EKG monitoring.  Hyperventilation was not performed. Photic Stimulation was performed.  This tracing captures wakefulness through Stage II sleep.  The EEG was recording for approximately 30 minutes. DESCRIPTION OF RECORD:  In the most alert state the alpha rhythm is 10 Hz in frequency, which is seen in the occipital region, attenuates with eye opening, and is bilaterally synchronous and symmetrical. Stage I sleep is characterized by disappearance of the alpha rhythm, slowing  of the background, and absence of muscle and eye blink artifact.  Stage II sleep is characterized by the presence of sleep spindles.  No focal slowing, sharp waves, or epileptiform discharges are seen. Photic stimulation was performed and did not produce any abnormalities. Heart rate was regular/irregular at a rate of 70 bpm. IMPRESSION:  This is a normal adult EEG in the awake, drowsy, and sleep states. No focal slowing, focal abnormalities, epileptiform discharges, or electrographic seizures are seen.  Of note, a normal EEG does not exclude the diagnosis of seizure disorder.  A repeat EEG or 24 hour EEG could be considered.  Clinical correlation is recommended.   Overnight EEG with video  Result Date: 06/01/2020 Charlsie QuestYadav, Priyanka O, MD     06/01/2020 10:56 AM Patient Name: William DaltonMichael T Odriscoll MRN: [id number] Epilepsy Attending: Charlsie QuestPriyanka O Yadav Referring Physician/Provider: Dr Erick BlinksSalman Khaliqdina Duration: 05/31/2020 1030 to 06/01/2020 1030 Patient history: 55 y.o. male with new onset seizure like events and multiple visits to the ED in the last 2 weeks. He has spells involving starring off along with events with GTCs. Minimal to no post ictal period and in some of the events, he is somewhat aware of what it going on. EEG to evaluate for seizure Level of alertness: Awake, asleep AEDs during EEG study: LEV Technical aspects: This EEG study was done with scalp electrodes positioned  according to the 10-20 International system of electrode placement. Electrical activity was acquired at a sampling rate of 500Hz  and reviewed with a high frequency filter of 70Hz  and a low frequency filter of 1Hz . EEG data were recorded continuously and digitally stored. Description: The posterior dominant rhythm consists of 10 Hz activity of moderate voltage (25-35 uV) seen predominantly in posterior head regions, symmetric and reactive to eye opening and eye closing. Sleep was characterized by vertex waves, sleep spindles (12 to 14 Hz), maximal  frontocentral region. Hyperventilation and photic stimulation were not performed.   Wife reported two staring spells between 1800-1830 on 05/31/2020. Concomitant eeg before, during and after the event didn't show any eeg change to suggest seizure. IMPRESSION: This study is within normal limits. No seizures or epileptiform discharges were seen throughout the recording. Wife reported two staring spells between 1800-1830 on 05/31/2020 without concomitant eeg change and were NOT epileptic events/ Lora Havens   ECHOCARDIOGRAM COMPLETE  Result Date: 05/14/2020    ECHOCARDIOGRAM REPORT   Patient Name:   ATTILA MCCARTHY Date of Exam: 05/14/2020 Medical Rec #:  [id number]        Height:       67.0 in Accession #:    [id number]       Weight:       205.0 lb Date of Birth:  December 29, 1965         BSA:          2.044 m Patient Age:    12 years         BP:           104/53 mmHg Patient Gender: M                HR:           66 bpm. Exam Location:  Inpatient Procedure: 2D Echo Indications:    Syncope  History:        Patient has no prior history of Echocardiogram examinations.                 Risk Factors:Hypertension.  Sonographer:    Cammy Brochure Referring Phys: [id number] [place]  1. Left ventricular ejection fraction, by estimation, is 60 to 65%. The left ventricle has normal function. The left ventricle has no regional wall motion abnormalities. There is mild concentric left ventricular hypertrophy. Left ventricular diastolic parameters were normal.  2. Right ventricular systolic function is normal. The right ventricular size is normal.  3. The mitral valve is normal in structure. Trivial mitral valve regurgitation.  4. The aortic valve is tricuspid. Aortic valve regurgitation is not visualized. No aortic stenosis is present.  5. The inferior vena cava is normal in size with greater than 50% respiratory variability, suggesting right atrial pressure of 3 mmHg. Comparison(s): No prior Echocardiogram.  FINDINGS  Left Ventricle: Left ventricular ejection fraction, by estimation, is 60 to 65%. The left ventricle has normal function. The left ventricle has no regional wall motion abnormalities. The left ventricular internal cavity size was normal in size. There is  mild concentric left ventricular hypertrophy. Left ventricular diastolic parameters were normal. Right Ventricle: The right ventricular size is normal. No increase in right ventricular wall thickness. Right ventricular systolic function is normal. Left Atrium: Left atrial size was normal in size. Right Atrium: Right atrial size was normal in size. Pericardium: There is no evidence of pericardial effusion. Mitral Valve: The mitral valve is normal in structure. Trivial mitral valve  regurgitation. Tricuspid Valve: The tricuspid valve is normal in structure. Tricuspid valve regurgitation is trivial. Aortic Valve: The aortic valve is tricuspid. Aortic valve regurgitation is not visualized. No aortic stenosis is present. Aortic valve mean gradient measures 6.0 mmHg. Aortic valve peak gradient measures 9.7 mmHg. Aortic valve area, by VTI measures 2.32 cm. Pulmonic Valve: The pulmonic valve was normal in structure. Pulmonic valve regurgitation is not visualized. Aorta: The aortic root and ascending aorta are structurally normal, with no evidence of dilitation. Venous: The inferior vena cava is normal in size with greater than 50% respiratory variability, suggesting right atrial pressure of 3 mmHg. IAS/Shunts: No atrial level shunt detected by color flow Doppler.  LEFT VENTRICLE PLAX 2D LVIDd:         4.10 cm      Diastology LVIDs:         2.90 cm      LV e' medial:    8.81 cm/s LV PW:         1.20 cm      LV E/e' medial:  10.4 LV IVS:        1.10 cm      LV e' lateral:   9.57 cm/s LVOT diam:     2.10 cm      LV E/e' lateral: 9.5 LV SV:         59 LV SV Index:   29 LVOT Area:     3.46 cm  LV Volumes (MOD) LV vol d, MOD A2C: 105.0 ml LV vol d, MOD A4C: 107.0 ml LV  vol s, MOD A2C: 40.0 ml LV vol s, MOD A4C: 39.1 ml LV SV MOD A2C:     65.0 ml LV SV MOD A4C:     107.0 ml LV SV MOD BP:      66.2 ml RIGHT VENTRICLE             IVC RV Basal diam:  3.50 cm     IVC diam: 1.00 cm RV S prime:     19.80 cm/s TAPSE (M-mode): 3.0 cm LEFT ATRIUM             Index       RIGHT ATRIUM           Index LA diam:        2.40 cm 1.17 cm/m  RA Area:     10.40 cm LA Vol (A2C):   60.7 ml 29.70 ml/m RA Volume:   20.50 ml  10.03 ml/m LA Vol (A4C):   53.4 ml 26.13 ml/m LA Biplane Vol: 59.6 ml 29.16 ml/m  AORTIC VALVE AV Area (Vmax):    2.89 cm AV Area (Vmean):   2.53 cm AV Area (VTI):     2.32 cm AV Vmax:           156.00 cm/s AV Vmean:          112.000 cm/s AV VTI:            0.255 m AV Peak Grad:      9.7 mmHg AV Mean Grad:      6.0 mmHg LVOT Vmax:         130.00 cm/s LVOT Vmean:        81.800 cm/s LVOT VTI:          0.171 m LVOT/AV VTI ratio: 0.67  AORTA Ao Root diam: 3.30 cm Ao Asc diam:  2.90 cm MITRAL VALVE MV Area (PHT): 4.46 cm    SHUNTS MV  Decel Time: 170 msec    Systemic VTI:  0.17 m MV E velocity: 91.20 cm/s  Systemic Diam: 2.10 cm MV A velocity: 96.00 cm/s MV E/A ratio:  0.95 Gwyndolyn Kaufman MD Electronically signed by Gwyndolyn Kaufman MD Signature Date/Time: 05/14/2020/4:48:00 PM    Final    LONG TERM MONITOR-LIVE TELEMETRY (3-14 DAYS)  Result Date: 05/26/2020 Patch Wear Time:  4 days and 11 hours (2022-04-08T16:02:48-0400 to 2022-04-13T03:32:35-0400) Patient had a min HR of 52 bpm, max HR of 190 bpm, and avg HR of 74 bpm. Predominant underlying rhythm was Sinus Rhythm. 3 Supraventricular Tachycardia runs occurred, the run with the fastest interval lasting 6 beats with a max rate of 190 bpm, the longest lasting 8 beats with an avg rate of 116 bpm. Isolated SVEs were rare (<1.0%), SVE Couplets were rare (<1.0%), and SVE Triplets were rare (<1.0%). Isolated VEs were rare (<1.0%), and no VE Couplets or VE Triplets were present. Summary:  NSR averat HR 74 bpm limited SVT longest  only 8 beats No significant arrhythmias to cause syncope      Subjective: Has no complaints this AM.  No seizure activity Was reported by wife  Discharge Exam: Vitals:   06/02/20 0600 06/02/20 0843  BP:  118/71  Pulse: 60 65  Resp: 16 20  Temp:  98.2 F (36.8 C)  SpO2: 94% 97%   Vitals:   06/02/20 0400 06/02/20 0500 06/02/20 0600 06/02/20 0843  BP:    118/71  Pulse: (!) 55 (!) 57 60 65  Resp: 15 16 16 20   Temp:    98.2 F (36.8 C)  TempSrc:      SpO2: 93% 93% 94% 97%  Weight:      Height:        General: Pt is alert, awake, not in acute distress Cardiovascular: RRR, S1/S2 +, no rubs, no gallops Respiratory: CTA bilaterally, no wheezing, no rhonchi Abdominal: Soft, NT, ND, bowel sounds + Extremities: no edema    The results of significant diagnostics from this hospitalization (including imaging, microbiology, ancillary and laboratory) are listed below for reference.     Microbiology: Recent Results (from the past 240 hour(s))  SARS CORONAVIRUS 2 (TAT 6-24 HRS) Nasopharyngeal Nasopharyngeal Swab     Status: None   Collection Time: 05/30/20 11:45 PM   Specimen: Nasopharyngeal Swab  Result Value Ref Range Status   SARS Coronavirus 2 NEGATIVE NEGATIVE Final    Comment: (NOTE) SARS-CoV-2 target nucleic acids are NOT DETECTED.  The SARS-CoV-2 RNA is generally detectable in upper and lower respiratory specimens during the acute phase of infection. Negative results do not preclude SARS-CoV-2 infection, do not rule out co-infections with other pathogens, and should not be used as the sole basis for treatment or other patient management decisions. Negative results must be combined with clinical observations, patient history, and epidemiological information. The expected result is Negative.  Fact Sheet for Patients: SugarRoll.be  Fact Sheet for Healthcare Providers: https://www.woods-mathews.com/  This test is not yet  approved or cleared by the [place] FDA and  has been authorized for detection and/or diagnosis of SARS-CoV-2 by FDA under an Emergency Use Authorization (EUA). This EUA will remain  in effect (meaning this test can be used) for the duration of the COVID-19 declaration under Se ction 564(b)(1) of the Act, 21 U.S.C. section 360bbb-3(b)(1), unless the authorization is terminated or revoked sooner.  Performed at [facility], [place] [address] [place], [place] [address]   MRSA PCR Screening  Status: None   Collection Time: 05/31/20  3:35 AM   Specimen: Nasal Mucosa; Nasopharyngeal  Result Value Ref Range Status   MRSA by PCR NEGATIVE NEGATIVE Final    Comment:        The GeneXpert MRSA Assay (FDA approved for NASAL specimens only), is one component of a comprehensive MRSA colonization surveillance program. It is not intended to diagnose MRSA infection nor to guide or monitor treatment for MRSA infections. Performed at [facility], [place] [address] [place], [place] [address]      Labs: BNP (last 3 results) No results for input(s): BNP in the last 8760 hours. Basic Metabolic Panel: Recent Labs  Lab 05/30/20 2019 05/31/20 0342  NA 138 141  K 3.5 4.0  CL 102 103  CO2 27 31  GLUCOSE 90 98  BUN 17 15  CREATININE 1.00 1.15  CALCIUM 8.9 8.9  MG  --  2.0   Liver Function Tests: Recent Labs  Lab 05/30/20 2019 05/31/20 0342  AST 22 21  ALT 25 23  ALKPHOS 84 76  BILITOT 0.8 0.9  PROT 6.9 6.2*  ALBUMIN 4.4 4.0   No results for input(s): LIPASE, AMYLASE in the last 168 hours. No results for input(s): AMMONIA in the last 168 hours. CBC: Recent Labs  Lab 05/30/20 2019 05/31/20 0342  WBC 5.8 5.4  NEUTROABS 3.0  --   HGB 13.9 13.8  HCT 41.3 40.3  MCV 87.1 84.7  PLT 289 266   Cardiac Enzymes: No results for input(s): CKTOTAL, CKMB, CKMBINDEX, TROPONINI in the last 168 hours. BNP: Invalid input(s): POCBNP CBG: No results for input(s): GLUCAP  in the last 168 hours. D-Dimer No results for input(s): DDIMER in the last 72 hours. Hgb A1c No results for input(s): HGBA1C in the last 72 hours. Lipid Profile No results for input(s): CHOL, HDL, LDLCALC, TRIG, CHOLHDL, LDLDIRECT in the last 72 hours. Thyroid function studies No results for input(s): TSH, T4TOTAL, T3FREE, THYROIDAB in the last 72 hours.  Invalid input(s): FREET3 Anemia work up No results for input(s): VITAMINB12, FOLATE, FERRITIN, TIBC, IRON, RETICCTPCT in the last 72 hours. Urinalysis No results found for: COLORURINE, APPEARANCEUR, [place], [place], [place], [place], [place], [place], PROTEINUR, UROBILINOGEN, NITRITE, LEUKOCYTESUR Sepsis Labs Invalid input(s): PROCALCITONIN,  WBC,  LACTICIDVEN Microbiology Recent Results (from the past 240 hour(s))  SARS CORONAVIRUS 2 (TAT 6-24 HRS) Nasopharyngeal Nasopharyngeal Swab     Status: None   Collection Time: 05/30/20 11:45 PM   Specimen: Nasopharyngeal Swab  Result Value Ref Range Status   SARS Coronavirus 2 NEGATIVE NEGATIVE Final    Comment: (NOTE) SARS-CoV-2 target nucleic acids are NOT DETECTED.  The SARS-CoV-2 RNA is generally detectable in upper and lower respiratory specimens during the acute phase of infection. Negative results do not preclude SARS-CoV-2 infection, do not rule out co-infections with other pathogens, and should not be used as the sole basis for treatment or other patient management decisions. Negative results must be combined with clinical observations, patient history, and epidemiological information. The expected result is Negative.  Fact Sheet for Patients: SugarRoll.be  Fact Sheet for Healthcare Providers: https://www.woods-mathews.com/  This test is not yet approved or cleared by the [place] FDA and  has been authorized for detection and/or diagnosis of SARS-CoV-2 by FDA under an Emergency Use Authorization (EUA). This EUA will  remain  in effect (meaning this test can be used) for the duration of the COVID-19 declaration under Se ction 564(b)(1) of the Act, 21 U.S.C. section 360bbb-3(b)(1),  unless the authorization is terminated or revoked sooner.  Performed at [facility], [place] [address] [place], [place] [address]   MRSA PCR Screening     Status: None   Collection Time: 05/31/20  3:35 AM   Specimen: Nasal Mucosa; Nasopharyngeal  Result Value Ref Range Status   MRSA by PCR NEGATIVE NEGATIVE Final    Comment:        The GeneXpert MRSA Assay (FDA approved for NASAL specimens only), is one component of a comprehensive MRSA colonization surveillance program. It is not intended to diagnose MRSA infection nor to guide or monitor treatment for MRSA infections. Performed at [facility], [place] [address], [place], [place] [address]      Time coordinating discharge: Over 30 minutes  SIGNED:   Nolberto Hanlon, MD  [facility] 06/02/2020, 1:35 PM Pager   If 7PM-7AM, please contact night-coverage [email or web address] Password [email or web address]

## 2020-06-02 NOTE — Procedures (Signed)
Patient Name:Mathew Moore NICOSON DTO:[id number] Epilepsy Attending:Sisto Granillo Barbra Sarks Referring Physician/Provider:Dr Donnetta Simpers Duration:06/02/2020 1030 to 5/3/20221256  Patient history:54 y.o.malewith new onset seizure like events and multiple visits to the ED in the last 2 weeks. He has spells involving starring off along with events with GTCs. Minimal to no post ictal period and in some of the events, he is somewhat aware of what it going on.EEG to evaluate for seizure  Level of alertness:Awake, asleep  AEDs during EEG study:LEV  Technical aspects: This EEG study was done with scalp electrodes positioned according to the 10-20 International system of electrode placement. Electrical activity was acquired at a sampling rate of 500Hz  and reviewed with a high frequency filter of 70Hz  and a low frequency filter of 1Hz . EEG data were recorded continuously and digitally stored.   Description: The posterior dominant rhythm consists of 10 Hz activity of moderate voltage (25-35 uV) seen predominantly in posterior head regions, symmetric and reactive to eye opening and eye closing. Sleep was characterized by vertex waves, sleep spindles (12 to 14 Hz), maximal frontocentral region. Hyperventilation and photic stimulation were not performed.  IMPRESSION: This study is within normal limits. No seizures or epileptiform discharges were seen throughout the recording.  Charolett Yarrow Barbra Sarks

## 2020-06-06 ENCOUNTER — Ambulatory Visit: Payer: 59 | Admitting: Nurse Practitioner

## 2020-06-10 ENCOUNTER — Encounter: Payer: Self-pay | Admitting: Diagnostic Neuroimaging

## 2020-06-10 ENCOUNTER — Ambulatory Visit (INDEPENDENT_AMBULATORY_CARE_PROVIDER_SITE_OTHER): Payer: 59 | Admitting: Diagnostic Neuroimaging

## 2020-06-10 VITALS — BP 129/81 | HR 65 | Ht 67.0 in | Wt 207.0 lb

## 2020-06-10 DIAGNOSIS — G4700 Insomnia, unspecified: Secondary | ICD-10-CM

## 2020-06-10 DIAGNOSIS — F418 Other specified anxiety disorders: Secondary | ICD-10-CM

## 2020-06-10 DIAGNOSIS — F445 Conversion disorder with seizures or convulsions: Secondary | ICD-10-CM | POA: Diagnosis not present

## 2020-06-10 MED ORDER — LAMOTRIGINE 25 MG PO TABS: 50.0000 mg | ORAL_TABLET | Freq: Two times a day (BID) | ORAL | 6 refills | Status: DC

## 2020-06-10 NOTE — Patient Instructions (Signed)
ABNORMAL SPELLS (likely non-epileptic spells)  - continue lamotrigine for now; will refer to psychiatry / psychology to take over medication and treatment for anxiety, insomnia and non-epileptic spells (also on ambien, clonazepam, citalopram)  - According to [place] law, you can not drive unless you are free from abnormal spells for at least 6 months and under physician's care.   - Please maintain precautions. Do not participate in activities where a loss of awareness could harm you or someone else. No swimming alone, no tub bathing, no hot tubs, no driving, no operating motorized vehicles (cars, ATVs, motocycles, etc), lawnmowers, power tools or firearms. No standing at heights, such as rooftops, ladders or stairs. Avoid hot objects such as stoves, heaters, open fires. Wear a helmet when riding a bicycle, scooter, skateboard, etc. and avoid areas of traffic. Set your water heater to 120 degrees or less.

## 2020-06-10 NOTE — Progress Notes (Signed)
[facility]  PATIENT: Mathew Moore DOB: 15-Aug-1965  REFERRING CLINICIAN: Sasser, Silvestre Moment, MD HISTORY FROM: patient  REASON FOR VISIT: follow up   HISTORICAL  CHIEF COMPLAINT:  Chief Complaint  Patient presents with  . Seizures    Rm 7 Hospital FU  wife- [name]  "current dose of lamictal is 25 mg BID, took lamictal last night for 1st time and couldn't sleep, so today I took both pills this morning; had twitching in my sleep a few nights; having a lot of indigestion now and sleeping less"     HISTORY OF PRESENT ILLNESS:   UPDATE (06/10/20, VRP): Since last visit, went to ER on 05/31/20 for more seizure like events. Had vEEG recording which was normal. Had 1-2 events of staring spells, without EEG correlates. Dx'd with probable non-epileptic spells. LEV d/c'd. Then started on lamotrigine for mood stabilization.   PRIOR HPI (05/27/20): 55 year old male with pulmonary sarcoidosis, anxiety, coronary artery disease, hypertension, here for evaluation of syncope or seizure.  05/07/2020 patient was at work when he collapsed and passed out.  He woke up with chest pain and went to the emergency room.  Cardiac and pulmonary etiologies were ruled out.  Patient followed up with cardiology and no specific etiology was found.  Patient returned to ER on 05/13/2020 after having another event at home with palpitations, loss of consciousness and then seizure-like activity.  Patient was admitted for evaluation.  MRI and EEG were unremarkable.  Patient had another event and 05/21/2020 but did not go to the hospital.  He had a fourth event on 05/26/2020 at home.  This 1 was recorded by patient's wife.  I reviewed the video and patient is laying prone on the floor with waxing and waning tapping movements of his bilateral upper extremities, moaning sounds, heavy breathing.  Patient was in the hospital for evaluation.  He had a second event witnessed in the emergency room with convulsions, normal  vital signs, lack of postictal confusion.  He was started on antiseizure medication levetiracetam.   REVIEW OF SYSTEMS: Full 14 system review of systems performed and negative with exception of: As per HPI.  ALLERGIES: No Known Allergies  HOME MEDICATIONS: Outpatient Medications Prior to Visit  Medication Sig Dispense Refill  . acetaminophen (TYLENOL) 500 MG tablet Take 1,000 mg by mouth every 6 (six) hours as needed for moderate pain or headache.     . albuterol (VENTOLIN HFA) 108 (90 Base) MCG/ACT inhaler Inhale 1-2 puffs into the lungs every 6 (six) hours as needed for wheezing or shortness of breath. 18 g 1  . aspirin EC 81 MG tablet Take 81 mg by mouth daily. Swallow whole.    Marland Kitchen atorvastatin (LIPITOR) 40 MG tablet Take 40 mg by mouth daily.    . Cholecalciferol (VITAMIN D) 50 MCG (2000 UT) CAPS Take 2,000 Units by mouth daily.    . citalopram (CELEXA) 20 MG tablet Take 20 mg by mouth daily.    . clonazePAM (KLONOPIN) 0.5 MG tablet Take 0.5 mg by mouth 3 (three) times daily as needed for anxiety.    . indapamide (LOZOL) 1.25 MG tablet Take 1.25 mg by mouth See admin instructions. Take 1/2 tablet daily    . lamoTRIgine (LAMICTAL) 25 MG tablet 5/2-5/8: 25mg  QAM 5/9-5/15: 25mg  BID 5/16-5/22: 50mg  QAM and 25g qhs 5/23 onwards 50mg  BID 60 tablet 2  . losartan (COZAAR) 25 MG tablet Take 25 mg by mouth daily.    Marland Kitchen omeprazole (PRILOSEC) 40 MG  capsule Take 1 capsule (40 mg total) by mouth in the morning and at bedtime. 60 capsule 11  . testosterone cypionate (DEPOTESTOSTERONE CYPIONATE) 200 MG/ML injection Inject 200 mg into the muscle every 30 (thirty) days.    Marland Kitchen zolpidem (AMBIEN) 10 MG tablet Take 10 mg by mouth at bedtime.      Facility-Administered Medications Prior to Visit  Medication Dose Route Frequency Provider Last Rate Last Admin  . sodium chloride flush (NS) 0.9 % injection 3 mL  3 mL Intravenous Q12H Josue Hector, MD        PAST MEDICAL HISTORY: Past Medical History:   Diagnosis Date  . Anxiety    ocassional  . Atypical mole 08/24/2019   mild- right lower leg - anterior  . GERD (gastroesophageal reflux disease)   . Hypertension   . Sarcoidosis 07/2019  . Seizures ([place]) 05/30/2020    PAST SURGICAL HISTORY: Past Surgical History:  Procedure Laterality Date  . BRONCHOSCOPY    . CLEFT LIP REPAIR    . COLONOSCOPY    . HERNIA REPAIR Left   . LEFT HEART CATH AND CORONARY ANGIOGRAPHY N/A 08/23/2018   Procedure: LEFT HEART CATH AND CORONARY ANGIOGRAPHY;  Surgeon: Burnell Blanks, MD;  Location: [facility];  Service: Cardiovascular;  Laterality: N/A;  . MEDIASTINOSCOPY N/A 07/20/2019   Procedure: MEDIASTINOSCOPY;  Surgeon: Melrose Nakayama, MD;  Location: [facility];  Service: Thoracic;  Laterality: N/A;  . NASAL SEPTUM SURGERY    . PLANTAR FASCIA SURGERY Right   . VIDEO BRONCHOSCOPY WITH ENDOBRONCHIAL ULTRASOUND N/A 07/06/2019   Procedure: VIDEO BRONCHOSCOPY WITH ENDOBRONCHIAL ULTRASOUND;  Surgeon: Marshell Garfinkel, MD;  Location: [facility];  Service: Pulmonary;  Laterality: N/A;    FAMILY HISTORY: Family History  Problem Relation Age of Onset  . Breast cancer Mother   . Heart disease Father   . Lung cancer Father   . Heart attack Father     SOCIAL HISTORY: Social History   Socioeconomic History  . Marital status: Married    Spouse name: [name]  . Number of children: 2  . Years of education: Not on file  . Highest education level: Not on file  Occupational History  . Not on file  Tobacco Use  . Smoking status: Never Smoker  . Smokeless tobacco: Never Used  Vaping Use  . Vaping Use: Never used  Substance and Sexual Activity  . Alcohol use: Not Currently  . Drug use: Not Currently  . Sexual activity: Not on file  Other Topics Concern  . Not on file  Social History Narrative   Lives with wife   Social Determinants of Health   Financial Resource Strain: Low Risk   . Difficulty of Paying Living Expenses: Not hard at all   Food Insecurity: No Food Insecurity  . Worried About Charity fundraiser in the Last Year: Never true  . Ran Out of Food in the Last Year: Never true  Transportation Needs: No Transportation Needs  . Lack of Transportation (Medical): No  . Lack of Transportation (Non-Medical): No  Physical Activity: Sufficiently Active  . Days of Exercise per Week: 4 days  . Minutes of Exercise per Session: 40 min  Stress: No Stress Concern Present  . Feeling of Stress : Not at all  Social Connections: Moderately Integrated  . Frequency of Communication with Friends and Family: Three times a week  . Frequency of Social Gatherings with Friends and Family: Three times a week  . Attends  Religious Services: More than 4 times per year  . Active Member of Clubs or Organizations: No  . Attends Archivist Meetings: Never  . Marital Status: Married  Human resources officer Violence: Not At Risk  . Fear of Current or Ex-Partner: No  . Emotionally Abused: No  . Physically Abused: No  . Sexually Abused: No     PHYSICAL EXAM  GENERAL EXAM/CONSTITUTIONAL: Vitals:  Vitals:   06/10/20 0923  BP: 129/81  Pulse: 65  Weight: 207 lb (93.9 kg)  Height: 5\' 7"  (1.702 m)   Body mass index is 32.42 kg/m. Wt Readings from Last 3 Encounters:  06/10/20 207 lb (93.9 kg)  05/30/20 209 lb 7 oz (95 kg)  05/27/20 210 lb 6.4 oz (95.4 kg)    Patient is in no distress; well developed, nourished and groomed; neck is supple  CARDIOVASCULAR:  Examination of carotid arteries is normal; no carotid bruits  Regular rate and rhythm, no murmurs  Examination of peripheral vascular system by observation and palpation is normal  EYES:  Ophthalmoscopic exam of optic discs and posterior segments is normal; no papilledema or hemorrhages No exam data present  MUSCULOSKELETAL:  Gait, strength, tone, movements noted in Neurologic exam below  NEUROLOGIC: MENTAL STATUS:  No flowsheet data found.  awake, alert,  oriented to person, place and time  recent and remote memory intact  normal attention and concentration  language fluent, comprehension intact, naming intact  fund of knowledge appropriate  CRANIAL NERVE:   2nd - no papilledema on fundoscopic exam  2nd, 3rd, 4th, 6th - pupils equal and reactive to light, visual fields full to confrontation, extraocular muscles intact, no nystagmus  5th - facial sensation symmetric  7th - facial strength symmetric  8th - hearing intact  9th - palate elevates symmetrically, uvula midline  11th - shoulder shrug symmetric  12th - tongue protrusion midline  MOTOR:   normal bulk and tone, full strength in the BUE, BLE  SENSORY:   normal and symmetric to light touch, temperature, vibration  COORDINATION:   finger-nose-finger, fine finger movements normal  REFLEXES:   deep tendon reflexes present and symmetric  GAIT/STATION:   narrow based gait     DIAGNOSTIC DATA (LABS, IMAGING, TESTING) - I reviewed patient records, labs, notes, testing and imaging myself where available.  Lab Results  Component Value Date   WBC 5.4 05/31/2020   HGB 13.8 05/31/2020   HCT 40.3 05/31/2020   MCV 84.7 05/31/2020   PLT 266 05/31/2020      Component Value Date/Time   NA 141 05/31/2020 0342   K 4.0 05/31/2020 0342   CL 103 05/31/2020 0342   CO2 31 05/31/2020 0342   GLUCOSE 98 05/31/2020 0342   BUN 15 05/31/2020 0342   CREATININE 1.15 05/31/2020 0342   CALCIUM 8.9 05/31/2020 0342   PROT 6.2 (L) 05/31/2020 0342   ALBUMIN 4.0 05/31/2020 0342   AST 21 05/31/2020 0342   ALT 23 05/31/2020 0342   ALKPHOS 76 05/31/2020 0342   BILITOT 0.9 05/31/2020 0342   GFRNONAA >60 05/31/2020 0342   GFRAA >60 07/20/2019 0716   No results found for: CHOL, HDL, LDLCALC, LDLDIRECT, TRIG, CHOLHDL No results found for: HGBA1C No results found for: VITAMINB12 No results found for: TSH   05/14/20 MRI brain 1. Normal for age MRI appearance of the brain. No  acute intracranial abnormality. 2. Moderate left maxillary sinus disease.  05/31/20 - 06/01/20 cVEEG - This study is within normal limits. No seizures  or epileptiform discharges were seen throughout the recording. - Wife reported two staring spells between 1800-1830 on 05/31/2020 without concomitant eeg change and were NOT epileptic events.      ASSESSMENT AND PLAN  55 y.o. year old male here with 4 episodes of loss of consciousness, some with prodromal presyncopal sensations, some with convulsions, with no specific triggering factors.  Differential diagnose include convulsive syncope versus epileptic seizures.  Also had 2 episodes in April 2022 which were witnessed by wife and emergency room staff with features for nonepileptic spells (including the video which I reviewed from patient's wife). vEEG in May 2022 capture 2 spells which were non-epileptic.   Dx:  1. Psychogenic nonepileptic seizure      PLAN:  ABNORMAL SPELLS (likely non-epileptic spells)  - continue lamotrigine up to 50mg  twice a day for now; will refer to psychiatry / psychology to take over lamotrigine medication long term and treatment for anxiety, insomnia and non-epileptic spells (also on ambien, clonazepam, citalopram)  - According to [place] law, you can not drive unless you are free from abnormal spells for at least 6 months and under physician's care.   - Please maintain precautions. Do not participate in activities where a loss of awareness could harm you or someone else. No swimming alone, no tub bathing, no hot tubs, no driving, no operating motorized vehicles (cars, ATVs, motocycles, etc), lawnmowers, power tools or firearms. No standing at heights, such as rooftops, ladders or stairs. Avoid hot objects such as stoves, heaters, open fires. Wear a helmet when riding a bicycle, scooter, skateboard, etc. and avoid areas of traffic. Set your water heater to 120 degrees or less.   Meds ordered this encounter   Medications  . lamoTRIgine (LAMICTAL) 25 MG tablet    Sig: Take 2 tablets (50 mg total) by mouth 2 (two) times daily.    Dispense:  120 tablet    Refill:  6   Return in about 1 year (around 06/10/2021).    Penni Bombard, MD 4/81/8563, 1:49 AM Certified in Neurology, Neurophysiology and Neuroimaging  [facility] [address], [place], [place] [address] [phone]

## 2020-06-11 DIAGNOSIS — J329 Chronic sinusitis, unspecified: Secondary | ICD-10-CM | POA: Insufficient documentation

## 2020-06-11 DIAGNOSIS — J343 Hypertrophy of nasal turbinates: Secondary | ICD-10-CM | POA: Insufficient documentation

## 2020-06-11 DIAGNOSIS — J342 Deviated nasal septum: Secondary | ICD-10-CM | POA: Insufficient documentation

## 2020-06-11 DIAGNOSIS — J324 Chronic pansinusitis: Secondary | ICD-10-CM | POA: Insufficient documentation

## 2020-06-17 NOTE — Telephone Encounter (Signed)
Faxed referral to [facility]. Phone: [phone]. Fax: [phone].

## 2020-07-17 NOTE — Progress Notes (Deleted)
Cardiology Office Note    Date:  07/17/2020   ID:  Mathew Moore, DOB 14-Jul-1965, MRN [id number]  PCP:  Manon Hilding, MD  Cardiologist: Jenkins Rouge, MD    No chief complaint on file.   History of Present Illness:    55 y.o. with history of chest pain and palpitations. Had false positive nuclear July 2020 with normal cath 08/23/18 Had recurrent pains April 2021 and CT showed sarcoid with mediastinal adenopathy positive PET/Biopsy.  Rx with steroids and followd by Dr Shearon Stalls pulmonary 05/07/20 had nausea with chest pain and syncope Sounded vagal. Labs ok CT head negative CTA no PE ECG no acute changes F/U telemetry benign 05/26/20 no significant arrhythmias. Referred to neuro for "memory fog" Carotids and MRI head pending Had multiple events after that ? 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  And referred to psychiatry/psychology  ***    Past Medical History:  Diagnosis Date   Anxiety    ocassional   Atypical mole 08/24/2019   mild- right lower leg - anterior   GERD (gastroesophageal reflux disease)    Hypertension    Sarcoidosis 07/2019   Seizures ([place]) 05/30/2020    Past Surgical History:  Procedure Laterality Date   BRONCHOSCOPY     CLEFT LIP REPAIR     COLONOSCOPY     HERNIA REPAIR Left    LEFT HEART CATH AND CORONARY ANGIOGRAPHY N/A 08/23/2018   Procedure: LEFT HEART CATH AND CORONARY ANGIOGRAPHY;  Surgeon: Burnell Blanks, MD;  Location: [place] [facility];  Service: Cardiovascular;  Laterality: N/A;   MEDIASTINOSCOPY N/A 07/20/2019   Procedure: MEDIASTINOSCOPY;  Surgeon: Melrose Nakayama, MD;  Location: [facility] OR;  Service: Thoracic;  Laterality: N/A;   NASAL SEPTUM SURGERY     PLANTAR FASCIA SURGERY Right    VIDEO BRONCHOSCOPY WITH ENDOBRONCHIAL ULTRASOUND N/A  07/06/2019   Procedure: VIDEO BRONCHOSCOPY WITH ENDOBRONCHIAL ULTRASOUND;  Surgeon: Marshell Garfinkel, MD;  Location: [place];  Service: Pulmonary;  Laterality: N/A;    Current Medications: Outpatient Medications Prior to Visit  Medication Sig Dispense Refill   acetaminophen (TYLENOL) 500 MG tablet Take 1,000 mg by mouth every 6 (six) hours as needed for moderate pain or headache.      albuterol (VENTOLIN HFA) 108 (90 Base) MCG/ACT inhaler Inhale 1-2 puffs into the lungs every 6 (six) hours as needed for wheezing or shortness of breath. 18 g 1   aspirin EC 81 MG tablet Take 81 mg by mouth daily. Swallow whole.     atorvastatin (LIPITOR) 40 MG tablet Take 40 mg by mouth daily.     Cholecalciferol (VITAMIN D) 50 MCG (2000 UT) CAPS Take 2,000 Units by mouth daily.     citalopram (CELEXA) 20 MG tablet Take 20 mg by mouth daily.     clonazePAM (KLONOPIN) 0.5 MG tablet Take 0.5 mg by mouth 3 (three) times daily as needed for anxiety.     indapamide (LOZOL) 1.25 MG tablet Take 1.25 mg by mouth See admin instructions. Take 1/2 tablet daily     lamoTRIgine (LAMICTAL) 25 MG tablet Take 2 tablets (50 mg total) by mouth 2 (two) times daily. 120 tablet 6   losartan (COZAAR) 25 MG tablet Take 25 mg by mouth daily.     omeprazole (PRILOSEC) 40 MG capsule Take 1 capsule (40 mg  total) by mouth in the morning and at bedtime. 60 capsule 11   testosterone cypionate (DEPOTESTOSTERONE CYPIONATE) 200 MG/ML injection Inject 200 mg into the muscle every 30 (thirty) days.     zolpidem (AMBIEN) 10 MG tablet Take 10 mg by mouth at bedtime.      Facility-Administered Medications Prior to Visit  Medication Dose Route Frequency Provider Last Rate Last Admin   sodium chloride flush (NS) 0.9 % injection 3 mL  3 mL Intravenous Q12H Josue Hector, MD         Allergies:   Patient has no known allergies.   Social History   Socioeconomic History   Marital status: Married    Spouse name: Economist of children: 2    Years of education: Not on file   Highest education level: Not on file  Occupational History   Not on file  Tobacco Use   Smoking status: Never   Smokeless tobacco: Never  Vaping Use   Vaping Use: Never used  Substance and Sexual Activity   Alcohol use: Not Currently   Drug use: Not Currently   Sexual activity: Not on file  Other Topics Concern   Not on file  Social History Narrative   Lives with wife   Social Determinants of Health   Financial Resource Strain: Not on file  Food Insecurity: Not on file  Transportation Needs: Not on file  Physical Activity: Not on file  Stress: Not on file  Social Connections: Not on file     Family History:  The patient's family history includes Breast cancer in his mother; Heart disease in his father; Lung cancer in his father.   Review of Systems:   Please see the history of present illness.     General:  No chills, fever, night sweats or weight changes. Positive for syncopal episode.  Cardiovascular:  No dyspnea on exertion, edema, orthopnea, paroxysmal nocturnal dyspnea. Positive for chest pain and palpitations.  Dermatological: No rash, lesions/masses Respiratory: No cough, dyspnea Urologic: No hematuria, dysuria Abdominal:   No nausea, vomiting, diarrhea, bright red blood per rectum, melena, or hematemesis Neurologic:  No visual changes, wkns. Positive for memory issues.   All other systems reviewed and are otherwise negative except as noted above.   Physical Exam:    VS:  There were no vitals taken for this visit.    Affect appropriate Healthy:  appears stated age 89: normal Neck supple with no adenopathy JVP normal no bruits no thyromegaly Lungs clear with no wheezing and good diaphragmatic motion Heart:  S1/S2 no murmur, no rub, gallop or click PMI normal Abdomen: benighn, BS positve, no tenderness, no AAA no bruit.  No HSM or HJR Distal pulses intact with no bruits No edema Neuro non-focal Skin warm and  dry No muscular weakness   Wt Readings from Last 3 Encounters:  06/10/20 93.9 kg  05/30/20 95 kg  05/27/20 95.4 kg     Studies/Labs Reviewed:   EKG:  EKG is not ordered today. EKG from 05/07/2020 is reviewed which shows NSR, HR 98 with LAD and no acute ST abnormalities.  Recent Labs: 05/31/2020: ALT 23; BUN 15; Creatinine, Ser 1.15; Hemoglobin 13.8; Magnesium 2.0; Platelets 266; Potassium 4.0; Sodium 141   Lipid Panel No results found for: CHOL, TRIG, HDL, CHOLHDL, VLDL, LDLCALC, LDLDIRECT  Additional studies/ records that were reviewed today include:   Cardiac Catheterization: 08/2018 Mid RCA lesion is 20% stenosed. Mid Cx lesion is 10% stenosed. Mid LAD lesion  is 30% stenosed.   1. Mild non-obstructive CAD 2. Normal filling pressures   Recommendations: Medical management of mild CAD   Cardiac MRI: 01/2020 IMPRESSION: 1. Normal cardiac MRI   2.  No evidence of sarcoid   3.  Normal LV size and function EF 63%     Plan:   In order of problems listed above:  1. Non epileptic Seizures:  episodes with documented normal vitals and no arrhythmia. No Rx Lamictal and referred Psychiatry. ? Due for MRI head History of sarcoid lung Cardiac MRI 01/24/20 normal EF 63% no cardiac sarcoid Telemetry with no arrhythmia or AV block ECG ok Observe F/U neurology continue Lamictal   2. Chest Pain - Atypical normal cath 08/23/18 ? 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   3. HTN - Well controlled.  Continue current medications and low sodium Dash type diet.    4. Pulmonary Sarcoid:  f/u Dr Shearon Stalls has been on steroids . CT 11/13/19 with diminished lymphadenopathy   F/U cardiology in a year   Medication Adjustments/Labs and Tests Ordered: Current medicines are reviewed at length with the patient today.  Concerns regarding medicines are outlined above.  Medication changes, Labs and Tests ordered today are listed in the Patient Instructions  below. There are no Patient Instructions on file for this visit.   Signed, Jenkins Rouge, MD  07/17/2020 4:46 PM    [facility] [address] [place], [place] [address] Phone: [phone] Fax: [phone]

## 2020-07-23 ENCOUNTER — Ambulatory Visit: Payer: 59 | Admitting: Cardiovascular Disease

## 2020-07-25 ENCOUNTER — Ambulatory Visit: Payer: 59 | Admitting: Diagnostic Neuroimaging

## 2020-08-17 ENCOUNTER — Emergency Department (HOSPITAL_COMMUNITY)
Admission: EM | Admit: 2020-08-17 | Discharge: 2020-08-17 | Disposition: A | Discharge status: Home / Self Care | Payer: 59 | Attending: Emergency Medicine | Admitting: Emergency Medicine

## 2020-08-17 ENCOUNTER — Emergency Department (HOSPITAL_COMMUNITY): Payer: 59

## 2020-08-17 ENCOUNTER — Encounter (HOSPITAL_COMMUNITY): Payer: Self-pay

## 2020-08-17 ENCOUNTER — Other Ambulatory Visit: Payer: Self-pay

## 2020-08-17 DIAGNOSIS — W1809XA Striking against other object with subsequent fall, initial encounter: Secondary | ICD-10-CM | POA: Diagnosis not present

## 2020-08-17 DIAGNOSIS — I10 Essential (primary) hypertension: Secondary | ICD-10-CM | POA: Insufficient documentation

## 2020-08-17 DIAGNOSIS — M542 Cervicalgia: Secondary | ICD-10-CM | POA: Insufficient documentation

## 2020-08-17 DIAGNOSIS — Y9389 Activity, other specified: Secondary | ICD-10-CM | POA: Insufficient documentation

## 2020-08-17 DIAGNOSIS — Z7982 Long term (current) use of aspirin: Secondary | ICD-10-CM | POA: Insufficient documentation

## 2020-08-17 DIAGNOSIS — Y92010 Kitchen of single-family (private) house as the place of occurrence of the external cause: Secondary | ICD-10-CM | POA: Diagnosis not present

## 2020-08-17 DIAGNOSIS — S0990XA Unspecified injury of head, initial encounter: Secondary | ICD-10-CM | POA: Diagnosis not present

## 2020-08-17 DIAGNOSIS — R569 Unspecified convulsions: Secondary | ICD-10-CM | POA: Insufficient documentation

## 2020-08-17 DIAGNOSIS — Z79899 Other long term (current) drug therapy: Secondary | ICD-10-CM | POA: Insufficient documentation

## 2020-08-17 LAB — CBC WITH DIFFERENTIAL/PLATELET
Abs Immature Granulocytes: 0.01 10*3/uL (ref 0.00–0.07)
Basophils Absolute: 0.1 10*3/uL (ref 0.0–0.1)
Basophils Relative: 1 %
Eosinophils Absolute: 0.2 10*3/uL (ref 0.0–0.5)
Eosinophils Relative: 4 %
HCT: 41.2 % (ref 39.0–52.0)
Hemoglobin: 13.6 g/dL (ref 13.0–17.0)
Immature Granulocytes: 0 %
Lymphocytes Relative: 42 %
Lymphs Abs: 2.4 10*3/uL (ref 0.7–4.0)
MCH: 29.4 pg (ref 26.0–34.0)
MCHC: 33 g/dL (ref 30.0–36.0)
MCV: 89 fL (ref 80.0–100.0)
Monocytes Absolute: 0.5 10*3/uL (ref 0.1–1.0)
Monocytes Relative: 8 %
Neutro Abs: 2.5 10*3/uL (ref 1.7–7.7)
Neutrophils Relative %: 45 %
Platelets: 288 10*3/uL (ref 150–400)
RBC: 4.63 MIL/uL (ref 4.22–5.81)
RDW: 13.3 % (ref 11.5–15.5)
WBC: 5.7 10*3/uL (ref 4.0–10.5)
nRBC: 0 % (ref 0.0–0.2)

## 2020-08-17 LAB — COMPREHENSIVE METABOLIC PANEL
ALT: 32 U/L (ref 0–44)
AST: 25 U/L (ref 15–41)
Albumin: 4.3 g/dL (ref 3.5–5.0)
Alkaline Phosphatase: 71 U/L (ref 38–126)
Anion gap: 5 (ref 5–15)
BUN: 16 mg/dL (ref 6–20)
CO2: 28 mmol/L (ref 22–32)
Calcium: 8.9 mg/dL (ref 8.9–10.3)
Chloride: 104 mmol/L (ref 98–111)
Creatinine, Ser: 1.1 mg/dL (ref 0.61–1.24)
GFR, Estimated: >60 mL/min (ref >60)
Glucose, Bld: 88 mg/dL (ref 70–99)
Potassium: 3.6 mmol/L (ref 3.5–5.1)
Sodium: 137 mmol/L (ref 135–145)
Total Bilirubin: 0.6 mg/dL (ref 0.3–1.2)
Total Protein: 7 g/dL (ref 6.5–8.1)

## 2020-08-17 LAB — ETHANOL: Alcohol, Ethyl (B): <10 mg/dL (ref <10)

## 2020-08-17 MED ORDER — ACETAMINOPHEN 325 MG PO TABS
650.0000 mg | ORAL_TABLET | Freq: Once | ORAL | Status: AC
  Administered 2020-08-17: 650 mg via ORAL
  Filled 2020-08-17: qty 2

## 2020-08-17 NOTE — ED Notes (Signed)
Patient returned from CT

## 2020-08-17 NOTE — ED Triage Notes (Signed)
BIB [facility] following seizure at home. Wife states he hit his head on cabinet when he fell. C/o cervical pain, c-collar in place. Recently placed on lamictal.

## 2020-08-17 NOTE — ED Provider Notes (Signed)
[facility] EMERGENCY DEPARTMENT Provider Note  CSN: [id number] Arrival date & time: 08/17/20 2011    History Chief Complaint  Patient presents with   Seizures    RICARD FAULKNER is a 55 y.o. male brought by EMS for evaluation of a seizure-like episode. He reports he got up from watching TV to get something to eat in the kitchen when he had sudden loss of consciousness with no prodromal symptoms. Per EMS wife witnessed seizure-like activity. Patient does not remember the episode, woke up on the floor with EMS in the house. No reported post-ictal phase. He has had similar episodes dating back to April. 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 He was initially on Keppra but switched to Lamictal and referred to Psychiatry. He has been following with them since May, has been increasing Lamictal dose without change in frequency of his seizure-like episodes. He is scheduled to see Neurology at [facility] this week for a second opinion. Today he reports headache and neck pain, thinks he hit the kitchen countertop on the way day. Otherwise he feels back to baseline.    Past Medical History:  Diagnosis Date   Anxiety    ocassional   Atypical mole 08/24/2019   mild- right lower leg - anterior   GERD (gastroesophageal reflux disease)    Hypertension    Sarcoidosis 07/2019   Seizures ([place]) 05/30/2020    Past Surgical History:  Procedure Laterality Date   BRONCHOSCOPY     CLEFT LIP REPAIR     COLONOSCOPY     HERNIA REPAIR Left    LEFT HEART CATH AND CORONARY ANGIOGRAPHY N/A 08/23/2018   Procedure: LEFT HEART CATH AND CORONARY ANGIOGRAPHY;  Surgeon: Burnell Blanks, MD;  Location: [place] [facility];  Service: Cardiovascular;  Laterality: N/A;   MEDIASTINOSCOPY N/A 07/20/2019   Procedure: MEDIASTINOSCOPY;  Surgeon: Melrose Nakayama, MD;  Location: [facility] OR;  Service: Thoracic;  Laterality:  N/A;   NASAL SEPTUM SURGERY     PLANTAR FASCIA SURGERY Right    VIDEO BRONCHOSCOPY WITH ENDOBRONCHIAL ULTRASOUND N/A 07/06/2019   Procedure: VIDEO BRONCHOSCOPY WITH ENDOBRONCHIAL ULTRASOUND;  Surgeon: Marshell Garfinkel, MD;  Location: [place];  Service: Pulmonary;  Laterality: N/A;    Family History  Problem Relation Age of Onset   Breast cancer Mother    Heart disease Father    Lung cancer Father    Heart attack Father     Social History   Tobacco Use   Smoking status: Never   Smokeless tobacco: Never  Vaping Use   Vaping Use: Never used  Substance Use Topics   Alcohol use: Not Currently   Drug use: Not Currently     Home Medications Prior to Admission medications   Medication Sig Start Date End Date Taking? Authorizing Provider  acetaminophen (TYLENOL) 500 MG tablet Take 1,000 mg by mouth every 6 (six) hours as needed for moderate pain or headache.    Yes [provider]  albuterol (VENTOLIN HFA) 108 (90 Base) MCG/ACT inhaler Inhale 1-2 puffs into the lungs every 6 (six) hours as needed for wheezing or shortness of breath. 09/11/19  Yes Martyn Ehrich, NP  aspirin EC 81 MG tablet Take 81 mg by mouth daily. Swallow whole.   Yes [provider]  atorvastatin (LIPITOR) 40 MG tablet Take 40 mg by mouth daily.   Yes [provider]  Cholecalciferol (  VITAMIN D) 50 MCG (2000 UT) CAPS Take 2,000 Units by mouth daily.   Yes [provider]  citalopram (CELEXA) 40 MG tablet Take 40 mg by mouth at bedtime. 07/24/20  Yes [provider]  clonazePAM (KLONOPIN) 0.5 MG tablet Take 0.5 mg by mouth 3 (three) times daily as needed for anxiety.   Yes [provider]  diclofenac (VOLTAREN) 75 MG EC tablet Take 75 mg by mouth 2 (two) times daily. 07/29/20  Yes [provider]  fluticasone (FLONASE) 50 MCG/ACT nasal spray Place 2 sprays into both nostrils daily as needed for allergies. 08/14/20  Yes [provider]  indapamide  (LOZOL) 1.25 MG tablet Take 1.25 mg by mouth See admin instructions. Take 1/2 tablet daily 04/09/20  Yes [provider]  lamoTRIgine (LAMICTAL) 25 MG tablet Take 2 tablets (50 mg total) by mouth 2 (two) times daily. 06/10/20  Yes Penumalli, Earlean Polka, MD  levETIRAcetam (KEPPRA) 500 MG tablet Take 500 mg by mouth 2 (two) times daily. 06/18/20  Yes [provider]  losartan (COZAAR) 25 MG tablet Take 25 mg by mouth daily. 05/16/20  Yes [provider]  omeprazole (PRILOSEC) 40 MG capsule Take 1 capsule (40 mg total) by mouth in the morning and at bedtime. 11/20/19  Yes Spero Geralds, MD  testosterone cypionate (DEPOTESTOSTERONE CYPIONATE) 200 MG/ML injection Inject 200 mg into the muscle every 30 (thirty) days. 05/01/20  Yes [provider]  zolpidem (AMBIEN) 10 MG tablet Take 10 mg by mouth at bedtime.  05/01/19  Yes [provider]     Allergies    Patient has no known allergies.   Review of Systems   Review of Systems A comprehensive review of systems was completed and negative except as noted in HPI.    Physical Exam BP 117/69   Pulse (!) 58   Temp 98.6 F (37 C) (Oral)   Resp 17   Ht 5\' 7"  (1.702 m)   Wt 93.9 kg   SpO2 99%   BMI 32.42 kg/m   Physical Exam Vitals and nursing note reviewed.  Constitutional:      Appearance: Normal appearance.  HENT:     Head: Normocephalic and atraumatic.     Nose: Nose normal.     Comments: Dried blood R naris    Mouth/Throat:     Mouth: Mucous membranes are moist.  Eyes:     Extraocular Movements: Extraocular movements intact.     Conjunctiva/sclera: Conjunctivae normal.  Neck:     Comments: In collar Cardiovascular:     Rate and Rhythm: Normal rate.  Pulmonary:     Effort: Pulmonary effort is normal.     Breath sounds: Normal breath sounds.  Abdominal:     General: Abdomen is flat.     Palpations: Abdomen is soft.     Tenderness: There is no abdominal tenderness.  Musculoskeletal:         General: No swelling. Normal range of motion.  Skin:    General: Skin is warm and dry.  Neurological:     General: No focal deficit present.     Mental Status: He is alert and oriented to person, place, and time.     Cranial Nerves: No cranial nerve deficit.     Sensory: No sensory deficit.     Motor: No weakness.  Psychiatric:        Mood and Affect: Mood normal.     ED Results / Procedures / Treatments  Labs (all labs ordered are listed, but only abnormal results are displayed) Labs Reviewed  COMPREHENSIVE METABOLIC PANEL  CBC WITH DIFFERENTIAL/PLATELET  ETHANOL    EKG EKG Interpretation  Date/Time:  Sunday August 17 2020 20:19:11 EDT Ventricular Rate:  68 PR Interval:  170 QRS Duration: 100 QT Interval:  393 QTC Calculation: 418 R Axis:   -37 Text Interpretation: Sinus rhythm Left axis deviation Abnormal R-wave progression, early transition No significant change since last tracing Confirmed by Calvert Cantor [id number]) on 08/17/2020 8:28:36 PM  Radiology CT Head Wo Contrast  Result Date: 08/17/2020 CLINICAL DATA:  Fall, head trauma, neck pain EXAM: CT HEAD WITHOUT CONTRAST CT CERVICAL SPINE WITHOUT CONTRAST TECHNIQUE: Multidetector CT imaging of the head and cervical spine was performed following the standard protocol without intravenous contrast. Multiplanar CT image reconstructions of the cervical spine were also generated. COMPARISON:  None. FINDINGS: CT HEAD FINDINGS Brain: No evidence of acute infarction, hemorrhage, hydrocephalus, extra-axial collection or mass lesion/mass effect. Vascular: No hyperdense vessel or unexpected calcification. Skull: Normal. Negative for fracture or focal lesion. Sinuses/Orbits: The visualized paranasal sinuses are essentially clear. The mastoid air cells are unopacified. Other: None. CT CERVICAL SPINE FINDINGS Alignment: Normal cervical lordosis. Skull base and vertebrae: No acute fracture. No primary bone lesion or focal pathologic  process. Soft tissues and spinal canal: No prevertebral fluid or swelling. No visible canal hematoma. Disc levels: Mild degenerative changes of the mid/lower cervical spine. Spinal canal is patent. Upper chest: Visualized lung apices are clear. Other: Visualized thyroid is unremarkable. IMPRESSION: Normal head CT. No evidence of traumatic injury to the cervical spine. Mild degenerative changes. Electronically Signed   By: Julian Hy M.D.   On: 08/17/2020 22:14   CT Cervical Spine Wo Contrast  Result Date: 08/17/2020 CLINICAL DATA:  Fall, head trauma, neck pain EXAM: CT HEAD WITHOUT CONTRAST CT CERVICAL SPINE WITHOUT CONTRAST TECHNIQUE: Multidetector CT imaging of the head and cervical spine was performed following the standard protocol without intravenous contrast. Multiplanar CT image reconstructions of the cervical spine were also generated. COMPARISON:  None. FINDINGS: CT HEAD FINDINGS Brain: No evidence of acute infarction, hemorrhage, hydrocephalus, extra-axial collection or mass lesion/mass effect. Vascular: No hyperdense vessel or unexpected calcification. Skull: Normal. Negative for fracture or focal lesion. Sinuses/Orbits: The visualized paranasal sinuses are essentially clear. The mastoid air cells are unopacified. Other: None. CT CERVICAL SPINE FINDINGS Alignment: Normal cervical lordosis. Skull base and vertebrae: No acute fracture. No primary bone lesion or focal pathologic process. Soft tissues and spinal canal: No prevertebral fluid or swelling. No visible canal hematoma. Disc levels: Mild degenerative changes of the mid/lower cervical spine. Spinal canal is patent. Upper chest: Visualized lung apices are clear. Other: Visualized thyroid is unremarkable. IMPRESSION: Normal head CT. No evidence of traumatic injury to the cervical spine. Mild degenerative changes. Electronically Signed   By: Julian Hy M.D.   On: 08/17/2020 22:14    Procedures Procedures  Medications Ordered in  the ED Medications  acetaminophen (TYLENOL) tablet 650 mg (650 mg Oral Given 08/17/20 2058)     MDM Rules/Calculators/A&P MDM Patient with continued seizure-like episodes. Awaiting a second opinion from Neuro. Has not had good control with Lamictal. He is back to baseline now. Will evaluate injury with CT. Check basic labs and continue to monitor.   ED Course  I have reviewed the triage vital signs and the nursing notes.  Pertinent labs & imaging results that were available during my care of the patient were reviewed by me  and considered in my medical decision making (see chart for details).  Clinical Course as of 08/17/20 2314  Nancy Fetter Aug 17, 2020  2049 CBC is normal.  [CS]  2059 CMP is normal.  [CS]  2219 CT head and c-spine neg for acute injury.  [CS]  2313 Patient remains asymptomatic. Recommend he continue with current medications and follow up with Neurology as scheduled this week.  [CS]    Clinical Course User Index [CS] Truddie Hidden, MD    Final Clinical Impression(s) / ED Diagnoses Final diagnoses:  Seizure-like activity [facility])  Injury of head, initial encounter    Rx / DC Orders ED Discharge Orders     None        Truddie Hidden, MD 08/17/20 2314

## 2020-08-26 ENCOUNTER — Ambulatory Visit: Payer: 59 | Admitting: Physician Assistant

## 2020-10-01 ENCOUNTER — Ambulatory Visit: Payer: 59 | Admitting: Diagnostic Neuroimaging

## 2020-10-23 NOTE — Progress Notes (Signed)
Cardiology Office Note    Date:  10/27/2020   ID:  Mathew Moore, DOB Mar 06, 1965, MRN [id number]  PCP:  Manon Hilding, MD  Cardiologist: Jenkins Rouge, MD    No chief complaint on file.   History of Present Illness:    Mathew Moore is a 55 y.o. male with past medical history of CAD (mild nonobstructive disease by cath in 08/2018), HTN and pulmonary sarcoidosis who presents to the office today for follow-up Cardiac MRI done  01/24/20 normal EF 63% no evidence of cardiac sarcoid   In the interim, he presented to [facility] ED on 05/07/2020 for evaluation of sudden nausea and chest discomfort while at work with associated syncope. He did report falling backwards and struck his head. Labs showed WBC 6.7, Hgb 14.2, platelets 316, Na+ 139, K+ 3.5 and creatinine 1.23. Initial and delta HS troponin values negative at 3 and D-dimer negative. CT Head showed no acute intracranial abnormalities. CTA chest with no evidence of a PE. EKG showed NSR, HR 98 with LAD and no acute ST abnormalities. He was monitored on telemetry and maintained NSR  Subsequent outpatient monitor 05/22/20 showed no significant arrhythmia   Complains of  memory fog. Was evaluated by his PCP for this and carotid dopplers were ordered along with him being referred to [facility] Neurology. MRI 05/14/20 head normal MRA intracranial and neck normal Subsequently diagnosed with seizures and placed on Keppra but this was changed to lamotrigine by neurology 06/02/20 EEG has not picked up elliptic activity Seen again in ER 08/17/20 for seizure like activity ? Psychogenic and referred to psychiatry ? Going to [facility] for 2 nd opinion   He will see psych for first time next week   Past Medical History:  Diagnosis Date   Anxiety    ocassional   Atypical mole 08/24/2019   mild- right lower leg - anterior   GERD (gastroesophageal reflux disease)    Hypertension    Sarcoidosis 07/2019   Seizures ([place]) 05/30/2020    Past Surgical History:   Procedure Laterality Date   BRONCHOSCOPY     CLEFT LIP REPAIR     COLONOSCOPY     HERNIA REPAIR Left    LEFT HEART CATH AND CORONARY ANGIOGRAPHY N/A 08/23/2018   Procedure: LEFT HEART CATH AND CORONARY ANGIOGRAPHY;  Surgeon: Burnell Blanks, MD;  Location: [place] [facility];  Service: Cardiovascular;  Laterality: N/A;   MEDIASTINOSCOPY N/A 07/20/2019   Procedure: MEDIASTINOSCOPY;  Surgeon: Melrose Nakayama, MD;  Location: [facility] OR;  Service: Thoracic;  Laterality: N/A;   NASAL SEPTUM SURGERY     PLANTAR FASCIA SURGERY Right    VIDEO BRONCHOSCOPY WITH ENDOBRONCHIAL ULTRASOUND N/A 07/06/2019   Procedure: VIDEO BRONCHOSCOPY WITH ENDOBRONCHIAL ULTRASOUND;  Surgeon: Marshell Garfinkel, MD;  Location: [place];  Service: Pulmonary;  Laterality: N/A;    Current Medications: Outpatient Medications Prior to Visit  Medication Sig Dispense Refill   acetaminophen (TYLENOL) 500 MG tablet Take 1,000 mg by mouth every 6 (six) hours as needed for moderate pain or headache.      albuterol (VENTOLIN HFA) 108 (90 Base) MCG/ACT inhaler Inhale 1-2 puffs into the lungs every 6 (six) hours as needed for wheezing or shortness of breath. 18 g 1   aspirin EC 81 MG tablet Take 81 mg by mouth daily. Swallow whole.     atorvastatin (LIPITOR) 40 MG tablet Take 40 mg by mouth daily.     Cholecalciferol (VITAMIN D) 50 MCG (2000 UT)  CAPS Take 2,000 Units by mouth daily.     clonazePAM (KLONOPIN) 0.5 MG tablet Take 0.5 mg by mouth 3 (three) times daily as needed for anxiety.     diclofenac (VOLTAREN) 75 MG EC tablet Take 75 mg by mouth 2 (two) times daily.     FLUoxetine (PROZAC) 20 MG capsule Take 20 mg by mouth every morning.     fluticasone (FLONASE) 50 MCG/ACT nasal spray Place 2 sprays into both nostrils daily as needed for allergies.     indapamide (LOZOL) 1.25 MG tablet Take 1.25 mg by mouth See admin instructions. Take 1/2 tablet daily     lamoTRIgine (LAMICTAL) 25 MG tablet Take 2 tablets (50 mg total) by  mouth 2 (two) times daily. 120 tablet 6   losartan (COZAAR) 25 MG tablet Take 25 mg by mouth daily.     omeprazole (PRILOSEC) 40 MG capsule Take 1 capsule (40 mg total) by mouth in the morning and at bedtime. 60 capsule 11   testosterone cypionate (DEPOTESTOSTERONE CYPIONATE) 200 MG/ML injection Inject 200 mg into the muscle every 30 (thirty) days.     zolpidem (AMBIEN) 10 MG tablet Take 10 mg by mouth at bedtime.      citalopram (CELEXA) 40 MG tablet Take 40 mg by mouth at bedtime.     levETIRAcetam (KEPPRA) 500 MG tablet Take 500 mg by mouth 2 (two) times daily.     Facility-Administered Medications Prior to Visit  Medication Dose Route Frequency Provider Last Rate Last Admin   sodium chloride flush (NS) 0.9 % injection 3 mL  3 mL Intravenous Q12H Josue Hector, MD         Allergies:   Patient has no known allergies.   Social History   Socioeconomic History   Marital status: Married    Spouse name: Economist of children: 2   Years of education: Not on file   Highest education level: Not on file  Occupational History   Not on file  Tobacco Use   Smoking status: Never   Smokeless tobacco: Never  Vaping Use   Vaping Use: Never used  Substance and Sexual Activity   Alcohol use: Not Currently   Drug use: Not Currently   Sexual activity: Not on file  Other Topics Concern   Not on file  Social History Narrative   Lives with wife   Social Determinants of Health   Financial Resource Strain: Not on file  Food Insecurity: Not on file  Transportation Needs: Not on file  Physical Activity: Not on file  Stress: Not on file  Social Connections: Not on file     Family History:  The patient's family history includes Breast cancer in his mother; Heart disease in his father; Lung cancer in his father.   Review of Systems:   Please see the history of present illness.     General:  No chills, fever, night sweats or weight changes. Positive for syncopal episode.   Cardiovascular:  No dyspnea on exertion, edema, orthopnea, paroxysmal nocturnal dyspnea. Positive for chest pain and palpitations.  Dermatological: No rash, lesions/masses Respiratory: No cough, dyspnea Urologic: No hematuria, dysuria Abdominal:   No nausea, vomiting, diarrhea, bright red blood per rectum, melena, or hematemesis Neurologic:  No visual changes, wkns. Positive for memory issues.   All other systems reviewed and are otherwise negative except as noted above.   Physical Exam:    VS:  BP 136/78   Pulse 65   Ht 5'  8" (1.727 m)   Wt 98.4 kg   SpO2 97%   BMI 32.99 kg/m    General: Well developed, well nourished,male appearing in no acute distress. Head: Normocephalic, atraumatic. Neck: No carotid bruits. JVD not elevated.  Lungs: Respirations regular and unlabored, without wheezes or rales.  Heart: Regular rate and rhythm. No S3 or S4.  No murmur, no rubs, or gallops appreciated. Abdomen: Appears non-distended. No obvious abdominal masses. Msk:  Strength and tone appear normal for age. No obvious joint deformities or effusions. Extremities: No clubbing or cyanosis. No lower extremity edema.  Distal pedal pulses are 2+ bilaterally. Neuro: Alert and oriented X 3. Moves all extremities spontaneously. No focal deficits noted. Psych:  Responds to questions appropriately with a normal affect. Skin: No rashes or lesions noted  Wt Readings from Last 3 Encounters:  10/27/20 98.4 kg  08/17/20 93.9 kg  06/10/20 93.9 kg     Studies/Labs Reviewed:   EKG:  EKG is not ordered today. EKG from 05/07/2020 is reviewed which shows NSR, HR 98 with LAD and no acute ST abnormalities.  Recent Labs: 05/31/2020: Magnesium 2.0 08/17/2020: ALT 32; BUN 16; Creatinine, Ser 1.10; Hemoglobin 13.6; Platelets 288; Potassium 3.6; Sodium 137   Lipid Panel No results found for: CHOL, TRIG, HDL, CHOLHDL, VLDL, LDLCALC, LDLDIRECT  Additional studies/ records that were reviewed today include:    Cardiac Catheterization: 08/2018 Mid RCA lesion is 20% stenosed. Mid Cx lesion is 10% stenosed. Mid LAD lesion is 30% stenosed.   1. Mild non-obstructive CAD 2. Normal filling pressures   Recommendations: Medical management of mild CAD   Cardiac MRI: 01/2020 IMPRESSION: 1. Normal cardiac MRI   2.  No evidence of sarcoid   3.  Normal LV size and function EF 63%    Plan:   In order of problems listed above:  1. Syncope - Unclear etiology but he reports associated chest pain and palpitations at that time. Workup during Emergency Department evaluation was unrevealing as outlined above. F/U monitor 05/22/20 no significant arrhythmias average HR 74 bpm SVT only 8 beats   2. Chest Pain - Occurred while at rest and persisted until arrival in the ED and did not respond to SL NTG. HS Troponin values were negative and EKG was without acute ST changes. Given his reassuring work-up and minimal CAD by cath in 08/2018, would not plan for repeat ischemic evaluation at this time unless he has recurrent symptoms.  - Continue ASA 81mg  daily and Atorvastatin 40mg  daily.   3. HTN - Well controlled.  Continue current medications and low sodium Dash type diet.    4. Seizures:  ? Psychogenic Rx Keppra then Lamictal f/u [facility]  Understands he cannot drive until free of spells for 6 months F/U [facility]    No active cardiac issues f/u PRN     Medication Adjustments/Labs and Tests Ordered: Current medicines are reviewed at length with the patient today.  Concerns regarding medicines are outlined above.  Medication changes, Labs and Tests ordered today are listed in the Patient Instructions below. There are no Patient Instructions on file for this visit.   Signed, Jenkins Rouge, MD  10/27/2020 10:39 AM    [facility] [address] [place], [place] [address] Phone: [phone] Fax: [phone]

## 2020-10-27 ENCOUNTER — Ambulatory Visit (INDEPENDENT_AMBULATORY_CARE_PROVIDER_SITE_OTHER): Payer: 59 | Admitting: Cardiovascular Disease

## 2020-10-27 ENCOUNTER — Other Ambulatory Visit: Payer: Self-pay

## 2020-10-27 ENCOUNTER — Encounter: Payer: Self-pay | Admitting: Cardiovascular Disease

## 2020-10-27 VITALS — BP 136/78 | HR 65 | Ht 68.0 in | Wt 217.0 lb

## 2020-10-27 DIAGNOSIS — R55 Syncope and collapse: Secondary | ICD-10-CM | POA: Diagnosis not present

## 2020-10-27 DIAGNOSIS — R079 Chest pain, unspecified: Secondary | ICD-10-CM | POA: Diagnosis not present

## 2020-10-27 DIAGNOSIS — I10 Essential (primary) hypertension: Secondary | ICD-10-CM | POA: Diagnosis not present

## 2020-10-27 NOTE — Patient Instructions (Signed)
Medication Instructions:  Your physician recommends that you continue on your current medications as directed. Please refer to the Current Medication list given to you today.  *If you need a refill on your cardiac medications before your next appointment, please call your pharmacy*   Lab Work: None today  If you have labs (blood work) drawn today and your tests are completely normal, you will receive your results only by: MyChart Message (if you have MyChart) OR A paper copy in the mail If you have any lab test that is abnormal or we need to change your treatment, we will call you to review the results.   Testing/Procedures: None today    Follow-Up: At [facility], you and your health needs are our priority.  As part of our continuing mission to provide you with exceptional heart care, we have created designated Provider Care Teams.  These Care Teams include your primary Cardiologist (physician) and Advanced Practice Providers (APPs -  Physician Assistants and Nurse Practitioners) who all work together to provide you with the care you need, when you need it.  We recommend signing up for the patient portal called "MyChart".  Sign up information is provided on this After Visit Summary.  MyChart is used to connect with patients for Virtual Visits (Telemedicine).  Patients are able to view lab/test results, encounter notes, upcoming appointments, etc.  Non-urgent messages can be sent to your provider as well.   To learn more about what you can do with MyChart, go to [email or web address].    Your next appointment:  As needed     

## 2020-11-08 ENCOUNTER — Other Ambulatory Visit: Payer: Self-pay | Admitting: Primary Care

## 2020-11-08 ENCOUNTER — Encounter (HOSPITAL_COMMUNITY): Payer: Self-pay | Admitting: Emergency Medicine

## 2020-11-08 ENCOUNTER — Emergency Department (HOSPITAL_COMMUNITY): Payer: 59

## 2020-11-08 ENCOUNTER — Emergency Department (HOSPITAL_COMMUNITY)
Admission: EM | Admit: 2020-11-08 | Discharge: 2020-11-08 | Disposition: A | Discharge status: Home / Self Care | Payer: 59 | Attending: Emergency Medicine | Admitting: Emergency Medicine

## 2020-11-08 DIAGNOSIS — S0083XA Contusion of other part of head, initial encounter: Secondary | ICD-10-CM | POA: Insufficient documentation

## 2020-11-08 DIAGNOSIS — I10 Essential (primary) hypertension: Secondary | ICD-10-CM | POA: Insufficient documentation

## 2020-11-08 DIAGNOSIS — Z23 Encounter for immunization: Secondary | ICD-10-CM | POA: Diagnosis not present

## 2020-11-08 DIAGNOSIS — X58XXXA Exposure to other specified factors, initial encounter: Secondary | ICD-10-CM | POA: Insufficient documentation

## 2020-11-08 DIAGNOSIS — F445 Conversion disorder with seizures or convulsions: Secondary | ICD-10-CM | POA: Insufficient documentation

## 2020-11-08 DIAGNOSIS — Z7982 Long term (current) use of aspirin: Secondary | ICD-10-CM | POA: Diagnosis not present

## 2020-11-08 DIAGNOSIS — I2510 Atherosclerotic heart disease of native coronary artery without angina pectoris: Secondary | ICD-10-CM | POA: Diagnosis not present

## 2020-11-08 DIAGNOSIS — Z79899 Other long term (current) drug therapy: Secondary | ICD-10-CM | POA: Insufficient documentation

## 2020-11-08 DIAGNOSIS — Z955 Presence of coronary angioplasty implant and graft: Secondary | ICD-10-CM | POA: Diagnosis not present

## 2020-11-08 DIAGNOSIS — S0990XA Unspecified injury of head, initial encounter: Secondary | ICD-10-CM | POA: Diagnosis present

## 2020-11-08 LAB — CBC WITH DIFFERENTIAL/PLATELET
Abs Immature Granulocytes: 0.01 10*3/uL (ref 0.00–0.07)
Basophils Absolute: 0 10*3/uL (ref 0.0–0.1)
Basophils Relative: 1 %
Eosinophils Absolute: 0 10*3/uL (ref 0.0–0.5)
Eosinophils Relative: 0 %
HCT: 40.3 % (ref 39.0–52.0)
Hemoglobin: 13.3 g/dL (ref 13.0–17.0)
Immature Granulocytes: 0 %
Lymphocytes Relative: 28 %
Lymphs Abs: 1.2 10*3/uL (ref 0.7–4.0)
MCH: 29.2 pg (ref 26.0–34.0)
MCHC: 33 g/dL (ref 30.0–36.0)
MCV: 88.6 fL (ref 80.0–100.0)
Monocytes Absolute: 0.6 10*3/uL (ref 0.1–1.0)
Monocytes Relative: 14 %
Neutro Abs: 2.4 10*3/uL (ref 1.7–7.7)
Neutrophils Relative %: 57 %
Platelets: 254 10*3/uL (ref 150–400)
RBC: 4.55 MIL/uL (ref 4.22–5.81)
RDW: 13.5 % (ref 11.5–15.5)
WBC: 4.2 10*3/uL (ref 4.0–10.5)
nRBC: 0 % (ref 0.0–0.2)

## 2020-11-08 LAB — COMPREHENSIVE METABOLIC PANEL
ALT: 30 U/L (ref 0–44)
AST: 24 U/L (ref 15–41)
Albumin: 4.1 g/dL (ref 3.5–5.0)
Alkaline Phosphatase: 90 U/L (ref 38–126)
Anion gap: 5 (ref 5–15)
BUN: 11 mg/dL (ref 6–20)
CO2: 26 mmol/L (ref 22–32)
Calcium: 8.5 mg/dL — ABNORMAL LOW (ref 8.9–10.3)
Chloride: 106 mmol/L (ref 98–111)
Creatinine, Ser: 0.9 mg/dL (ref 0.61–1.24)
GFR, Estimated: >60 mL/min (ref >60)
Glucose, Bld: 134 mg/dL — ABNORMAL HIGH (ref 70–99)
Potassium: 3.6 mmol/L (ref 3.5–5.1)
Sodium: 137 mmol/L (ref 135–145)
Total Bilirubin: 0.7 mg/dL (ref 0.3–1.2)
Total Protein: 6.7 g/dL (ref 6.5–8.1)

## 2020-11-08 MED ORDER — TETANUS-DIPHTH-ACELL PERTUSSIS 5-2.5-18.5 LF-MCG/0.5 IM SUSY
0.5000 mL | PREFILLED_SYRINGE | Freq: Once | INTRAMUSCULAR | Status: AC
  Administered 2020-11-08: 0.5 mL via INTRAMUSCULAR
  Filled 2020-11-08: qty 0.5

## 2020-11-08 NOTE — ED Triage Notes (Signed)
Pt brought in with [facility] after having a pseudoseizure at home. Hx of same. Pt states he also tested positive for COVID today at home.

## 2020-11-08 NOTE — ED Provider Notes (Signed)
[facility] EMERGENCY DEPARTMENT Provider Note   CSN: [id number] Arrival date & time: 11/08/20  2025     History Chief Complaint  Patient presents with   Seizures    Mathew Moore is a 55 y.o. male.  Patient with a history of pseudoseizures.  He stated he has had 3 EEGs that were normal.  Patient had a pseudoseizure today.  The history is provided by the patient and medical records.  Seizures Seizure activity on arrival: yes   Seizure type: Unknown. Preceding symptoms: no sensation of an aura present   Initial focality:  None Episode characteristics: abnormal movements   Episode characteristics: no combativeness   Postictal symptoms: no confusion   Return to baseline: yes   Severity:  Mild Timing:  Once Progression:  Improving     Past Medical History:  Diagnosis Date   Anxiety    ocassional   Atypical mole 08/24/2019   mild- right lower leg - anterior   GERD (gastroesophageal reflux disease)    Hypertension    Sarcoidosis 07/2019   Seizures ([place]) 05/30/2020    Patient Active Problem List   Diagnosis Date Noted   Chronic pansinusitis 06/11/2020   Deviated septum 06/11/2020   Nasal turbinate hypertrophy 06/11/2020   Essential hypertension 05/31/2020   Observed seizure-like activity ([place]) 05/30/2020   Syncope 05/14/2020   Hypokalemia 05/14/2020   Anxiety    Seizure-like activity ([place])    Sarcoidosis 08/10/2019   CAD (coronary artery disease) 07/2019   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   Lymphadenopathy, mediastinal 06/11/2019   Otorrhea, right 12/07/2018   Abnormal stress test    Chest pain    Encounter for screening colonoscopy 01/18/2018   Acute suppurative otitis media of left ear without spontaneous rupture of tympanic membrane 09/07/2017   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/22/2016   Eustachian tube dysfunction, bilateral 07/29/2015   Myringotomy tube status 07/29/2015   Dyspnea 04/02/2013    Past  Surgical History:  Procedure Laterality Date   BRONCHOSCOPY     CLEFT LIP REPAIR     COLONOSCOPY     HERNIA REPAIR Left    LEFT HEART CATH AND CORONARY ANGIOGRAPHY N/A 08/23/2018   Procedure: LEFT HEART CATH AND CORONARY ANGIOGRAPHY;  Surgeon: Burnell Blanks, MD;  Location: [place] [facility];  Service: Cardiovascular;  Laterality: N/A;   MEDIASTINOSCOPY N/A 07/20/2019   Procedure: MEDIASTINOSCOPY;  Surgeon: Melrose Nakayama, MD;  Location: [facility] OR;  Service: Thoracic;  Laterality: N/A;   NASAL SEPTUM SURGERY     PLANTAR FASCIA SURGERY Right    VIDEO BRONCHOSCOPY WITH ENDOBRONCHIAL ULTRASOUND N/A 07/06/2019   Procedure: VIDEO BRONCHOSCOPY WITH ENDOBRONCHIAL ULTRASOUND;  Surgeon: Marshell Garfinkel, MD;  Location: [place];  Service: Pulmonary;  Laterality: N/A;       Family History  Problem Relation Age of Onset   Breast cancer Mother    Heart disease Father    Lung cancer Father    Heart attack Father     Social History   Tobacco Use   Smoking status: Never   Smokeless tobacco: Never  Vaping Use   Vaping Use: Never used  Substance Use Topics   Alcohol use: Not Currently   Drug use: Not Currently    Home Medications Prior to Admission medications   Medication Sig Start Date End Date Taking? Authorizing Provider  acetaminophen (TYLENOL) 500 MG tablet Take 1,000 mg by mouth every 6 (six) hours as needed for moderate  pain or headache.     [provider]  albuterol (VENTOLIN HFA) 108 (90 Base) MCG/ACT inhaler Inhale 1-2 puffs into the lungs every 6 (six) hours as needed for wheezing or shortness of breath. 09/11/19   Martyn Ehrich, NP  aspirin EC 81 MG tablet Take 81 mg by mouth daily. Swallow whole.    [provider]  atorvastatin (LIPITOR) 40 MG tablet Take 40 mg by mouth daily.    [provider]  Cholecalciferol (VITAMIN D) 50 MCG (2000 UT) CAPS Take 2,000 Units by mouth daily.    [provider]  clonazePAM (KLONOPIN) 0.5 MG  tablet Take 0.5 mg by mouth 3 (three) times daily as needed for anxiety.    [provider]  diclofenac (VOLTAREN) 75 MG EC tablet Take 75 mg by mouth 2 (two) times daily. 07/29/20   [provider]  FLUoxetine (PROZAC) 20 MG capsule Take 20 mg by mouth every morning. 10/17/20   [provider]  fluticasone (FLONASE) 50 MCG/ACT nasal spray Place 2 sprays into both nostrils daily as needed for allergies. 08/14/20   [provider]  indapamide (LOZOL) 1.25 MG tablet Take 1.25 mg by mouth See admin instructions. Take 1/2 tablet daily 04/09/20   [provider]  lamoTRIgine (LAMICTAL) 25 MG tablet Take 2 tablets (50 mg total) by mouth 2 (two) times daily. 06/10/20   Penumalli, Earlean Polka, MD  losartan (COZAAR) 25 MG tablet Take 25 mg by mouth daily. 05/16/20   [provider]  omeprazole (PRILOSEC) 40 MG capsule Take 1 capsule (40 mg total) by mouth in the morning and at bedtime. 11/20/19   Spero Geralds, MD  testosterone cypionate (DEPOTESTOSTERONE CYPIONATE) 200 MG/ML injection Inject 200 mg into the muscle every 30 (thirty) days. 05/01/20   [provider]  zolpidem (AMBIEN) 10 MG tablet Take 10 mg by mouth at bedtime.  05/01/19   [provider]    Allergies    Patient has no known allergies.  Review of Systems   Review of Systems  Constitutional:  Negative for appetite change and fatigue.  HENT:  Negative for congestion, ear discharge and sinus pressure.   Eyes:  Negative for discharge.  Respiratory:  Negative for cough.   Cardiovascular:  Negative for chest pain.  Gastrointestinal:  Negative for abdominal pain and diarrhea.  Genitourinary:  Negative for frequency and hematuria.  Musculoskeletal:  Negative for back pain.  Skin:  Negative for rash.  Neurological:  Positive for seizures. Negative for headaches.       Pseudoseizures  Psychiatric/Behavioral:  Negative for hallucinations.    Physical Exam Updated Vital  Signs BP 134/76   Pulse 69   Temp 98 F (36.7 C)   Resp 16   Ht 5\' 8"  (1.727 m)   Wt 98.4 kg   SpO2 96%   BMI 32.99 kg/m   Physical Exam Vitals and nursing note reviewed.  Constitutional:      Appearance: He is well-developed.  HENT:     Head: Normocephalic.     Nose: Nose normal.  Eyes:     General: No scleral icterus.    Conjunctiva/sclera: Conjunctivae normal.  Neck:     Thyroid: No thyromegaly.  Cardiovascular:     Rate and Rhythm: Normal rate and regular rhythm.     Heart sounds: No murmur heard.   No friction rub. No gallop.  Pulmonary:     Breath sounds: No stridor. No wheezing or rales.  Chest:  Chest wall: No tenderness.  Abdominal:     General: There is no distension.     Tenderness: There is no abdominal tenderness. There is no rebound.  Musculoskeletal:        General: Normal range of motion.     Cervical back: Neck supple.  Lymphadenopathy:     Cervical: No cervical adenopathy.  Skin:    Findings: No erythema or rash.  Neurological:     Mental Status: He is alert and oriented to person, place, and time.     Motor: No abnormal muscle tone.     Coordination: Coordination normal.  Psychiatric:        Behavior: Behavior normal.    ED Results / Procedures / Treatments   Labs (all labs ordered are listed, but only abnormal results are displayed) Labs Reviewed  COMPREHENSIVE METABOLIC PANEL - Abnormal; Notable for the following components:      Result Value   Glucose, Bld 134 (*)    Calcium 8.5 (*)    All other components within normal limits  CBC WITH DIFFERENTIAL/PLATELET    EKG None  Radiology CT Head Wo Contrast  Result Date: 11/08/2020 CLINICAL DATA:  Cerebral hemorrhage suspected. Pseudo seizure at home, fall striking the nose. Forehead abrasion. Positive COVID test today. EXAM: CT HEAD WITHOUT CONTRAST TECHNIQUE: Contiguous axial images were obtained from the base of the skull through the vertex without intravenous contrast.  COMPARISON:  08/17/2020 FINDINGS: Brain: No evidence of acute infarction, hemorrhage, hydrocephalus, extra-axial collection or mass lesion/mass effect. Vascular: No hyperdense vessel or unexpected calcification. Skull: Normal. Negative for fracture or focal lesion. Sinuses/Orbits: No acute finding. Other: None. IMPRESSION: No acute intracranial abnormalities. Electronically Signed   By: Lucienne Capers M.D.   On: 11/08/2020 21:38    Procedures Procedures   Medications Ordered in ED Medications  Tdap (BOOSTRIX) injection 0.5 mL (has no administration in time range)    ED Course  I have reviewed the triage vital signs and the nursing notes.  Pertinent labs & imaging results that were available during my care of the patient were reviewed by me and considered in my medical decision making (see chart for details).    MDM Rules/Calculators/A&P                           Patient with a pseudoseizure and a small contusion to his forehead.  He will follow-up with his MD.  Labs and CT scan of the head unremarkable Final Clinical Impression(s) / ED Diagnoses Final diagnoses:  Pseudoseizure    Rx / DC Orders ED Discharge Orders     None        Milton Ferguson, MD 11/10/20 1118

## 2020-11-08 NOTE — Discharge Instructions (Addendum)
Contact your doctor Monday and let them know that she had another pseudoseizure

## 2020-11-18 ENCOUNTER — Other Ambulatory Visit: Payer: Self-pay | Admitting: Otolaryngology

## 2020-11-25 ENCOUNTER — Other Ambulatory Visit: Payer: Self-pay

## 2020-11-25 ENCOUNTER — Encounter (HOSPITAL_BASED_OUTPATIENT_CLINIC_OR_DEPARTMENT_OTHER): Payer: Self-pay | Admitting: Otolaryngology

## 2020-11-25 NOTE — Progress Notes (Signed)
Patient states he has been having uncontrolled seizures since April of this year. Last one was 11/22/20. Per Dr. Royce Macadamia and anesthesia protocols, patient cannot have surgery at [facility]. Left message for Florentina Jenny, at Dr. Victorio Palm office, notifying of this.

## 2020-12-02 ENCOUNTER — Other Ambulatory Visit: Payer: Self-pay

## 2020-12-02 ENCOUNTER — Encounter (HOSPITAL_COMMUNITY): Payer: Self-pay | Admitting: Otolaryngology

## 2020-12-02 NOTE — Progress Notes (Addendum)
Mathew. Mathew Moore denies chesty pain or shortness of breath.  Patient denies having any s/s of Covid in his household.  Patient denies any known exposure to Covid.   Mathew Moore was seen by Dr. Johnsie Cancel in 2020 and had a cardiac work up, patient was cleared after negative test results.  Mathew Moore was seen in [facility] 05/2020, patient had syncope, palpations and chest pain. Cardiac testing did not find a cardiac reason for the symptoms. Mathew Moore saw Dr. Johnsie Cancel 10/2020 for a follow up, the thought that the symptoms are caused by anxiety- patient reported.  Dr. Johnsie Cancel instructed Mathew Moore to come patient if needed, no need for a scheduled appointment at this time.  Mathew Moore is having seizures he does not get an aura.  The  seizures can happen at any time. Mathew Moore went to [facility] for a second opinion. Patient is seeing Amy Kerns., PA-C., she is working on patient's medications and patient will be seen by a therapist. 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  Patient reports that seizures can be different from each other, lasting 3-5 minutes. Mathew Moore states that he usually comes to when someone says his name.  PCP is Dr Consuello Masse.  I instructed patient to shower with antibiotic soap, if it is available.  Dry off with a clean towel. Do not put lotion, powder, cologne or deodorant or makeup.No jewelry or piercings. Men may shave their face and neck. Woman should not shave. No nail polish, artificial or acrylic nails. Wear clean clothes, brush your teeth. Glasses, contact lens,dentures or partials may not be worn in the OR. If you need to wear them, please bring a case for glasses, do not wear contacts or bring a case, the hospital does not have contact cases, dentures or partials will have to be removed , make sure they  are clean, we will provide a denture cup to put them in. You will need some one to drive you home and a responsible person over the age of 7 to stay with you for the first 24 hours after surgery.

## 2020-12-02 NOTE — Anesthesia Preprocedure Evaluation (Addendum)
Anesthesia Evaluation  Patient identified by MRN, date of birth, ID band Patient awake    Reviewed: Allergy & Precautions, NPO status , Patient's Chart, lab work & pertinent test results  Airway Mallampati: II  TM Distance: >3 FB Neck ROM: Full    Dental  (+) Dental Advisory Given, Missing,    Pulmonary shortness of breath,    Pulmonary exam normal breath sounds clear to auscultation       Cardiovascular hypertension, Pt. on medications + CAD  Normal cardiovascular exam Rhythm:Regular Rate:Normal  Echo 05/2020 1. Left ventricular ejection fraction, by estimation, is 60 to 65%. The left ventricle has normal function. The left ventricle has no regional wall motion abnormalities. There is mild concentric left ventricular hypertrophy. Left ventricular diastolic parameters were normal.  2. Right ventricular systolic function is normal. The right ventricular size is normal.  3. The mitral valve is normal in structure. Trivial mitral valve regurgitation.  4. The aortic valve is tricuspid. Aortic valve regurgitation is not visualized. No aortic stenosis is present.  5. The inferior vena cava is normal in size with greater than 50% respiratory variability, suggesting right atrial pressure of 3 mmHg.    Neuro/Psych Seizures -, Well Controlled,  PSYCHIATRIC DISORDERS Anxiety    GI/Hepatic Neg liver ROS, GERD  ,  Endo/Other  negative endocrine ROS  Renal/GU negative Renal ROS     Musculoskeletal negative musculoskeletal ROS (+)   Abdominal (+) + obese,   Peds  Hematology negative hematology ROS (+)   Anesthesia Other Findings   Reproductive/Obstetrics                                                            Anesthesia Evaluation  Patient identified by MRN, date of birth, ID band Patient awake    Reviewed: Allergy & Precautions, NPO status , Patient's Chart, lab work & pertinent test  results  History of Anesthesia Complications Negative for: history of anesthetic complications  Airway Mallampati: II  TM Distance: >3 FB Neck ROM: Full    Dental  (+) Dental Advisory Given, Teeth Intact   Pulmonary neg pulmonary ROS,    Pulmonary exam normal        Cardiovascular hypertension, Pt. on medications Normal cardiovascular exam   '20 Cath - Mid RCA lesion is 20% stenosed. Mid Cx lesion is 10% stenosed. Mid LAD lesion is 30% stenosed    Neuro/Psych PSYCHIATRIC DISORDERS Anxiety negative neurological ROS     GI/Hepatic Neg liver ROS, GERD  Controlled,  Endo/Other   Obesity   Renal/GU negative Renal ROS     Musculoskeletal negative musculoskeletal ROS (+)   Abdominal   Peds  Hematology negative hematology ROS (+)   Anesthesia Other Findings Covid test negative   Reproductive/Obstetrics                            Anesthesia Physical Anesthesia Plan  ASA: III  Anesthesia Plan: General   Post-op Pain Management:    Induction: Intravenous  PONV Risk Score and Plan: 2 and Treatment may vary due to age or medical condition, Ondansetron, Dexamethasone and Midazolam  Airway Management Planned: Oral ETT  Additional Equipment: Arterial line  Intra-op Plan:   Post-operative Plan: Extubation in OR  Informed Consent: I  have reviewed the patients History and Physical, chart, labs and discussed the procedure including the risks, benefits and alternatives for the proposed anesthesia with the patient or authorized representative who has indicated his/her understanding and acceptance.     Dental advisory given  Plan Discussed with: CRNA and Anesthesiologist  Anesthesia Plan Comments:        Anesthesia Quick Evaluation  Anesthesia Physical Anesthesia Plan  ASA: 2  Anesthesia Plan: General   Post-op Pain Management:    Induction: Intravenous  PONV Risk Score and Plan: 3 and Ondansetron,  Dexamethasone, Treatment may vary due to age or medical condition, Midazolam and Diphenhydramine  Airway Management Planned: Oral ETT  Additional Equipment:   Intra-op Plan:   Post-operative Plan: Extubation in OR  Informed Consent: I have reviewed the patients History and Physical, chart, labs and discussed the procedure including the risks, benefits and alternatives for the proposed anesthesia with the patient or authorized representative who has indicated his/her understanding and acceptance.     Dental advisory given  Plan Discussed with: CRNA  Anesthesia Plan Comments: (PAT note by Karoline Caldwell, PA-C:  Patient has been undergoing work-up for seizure-like activity.  He has not had numerous EEGs and evaluations.  He recently underwent multi day video EEG study at [facility] in August 2022.  Diagnosis was functional neurologic disorder.  Per impression/summary from Dr. Joanie Coddington in discharge note 09/24/2020, "Imp/Summary: this video EEG study was diagnostic of functional neurological disorder with seizures based on one typical spell captured. There was no evidence to suggest concomitant epilepsy, including with taper of lamotrigine and clonazepam and >24 hours of recording with these medications fully held. He was discharged with educational materials and plan for counseling/CBT specifically directed at functional neurological disorder. Efforts to coordinate care with pt's mental health team are outlined. Neither lamotrigine nor clonazepam are needed for seizure prevention; defer to psychiatry regarding next steps in management with these medications/to evaluate whether they are needed from a psychiatric perspective."  Previous cardiology eval due to history of sarcoidosis, hypertension, and chest discomfort.  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  Cardiac MRI 01/24/2020 showed normal EF 63% and no evidence of cardiac sarcoid. he presented to [facility] ED on 05/07/2020 for evaluation of  sudden nausea and chest discomfort while at work with associated syncope. He did report falling backwards and struck his head. Labs showed WBC 6.7, Hgb 14.2, platelets 316, Na+ 139, K+ 3.5 and creatinine 1.23. Initial and delta HS troponin values negative at 3 and D-dimer negative. CT Head showed no acute intracranial abnormalities. CTA chest with no evidence of a PE. EKG showed NSR, HR 98 with LAD and no acute ST abnormalities. He was monitored on telemetry and maintained NSRSubsequent outpatient monitor 05/22/20 showed no significant arrhythmia.  He was last seen by Dr. Johnsie Cancel on 10/27/2020 advised to follow-up with cardiology on an as-needed basis.  History of pulmonary sarcoidosis.  He has had thorough work-up with pulmonology.  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 Was started on prednisone summer 2021 and had a repeat CT scan which showed reduction in his mediastinal adenopathy. Prednisone made his symptoms worse so he was tapered off this in December 2021. In November 2020 when he had PFTs showing normal spirometry, lung volumes and diffusion capacity.  Last seen by pulmonologist Dr. Shearon Stalls 04/15/2020.  At that time sarcoidosis was felt to be in remission and he was recommended to continue  albuterol as needed and follow-up in 6 months.  CMP and CBC from ED visit (for pseudoseizure) 11/08/2020 reviewed, unremarkable.  Patient will need day of surgery evaluation.  EKG 08/17/20: Sinus rhythm. Rate 68. Left axis deviation. Abnormal R-wave progression, early transition. No significant change since last tracing  Zio Patch 05/26/20: Patch Wear Time: 4 days and 11 hours (2022-04-08T16:02:48-0400 to 2022-04-13T03:32:35-0400)  Patient had a min HR of 52 bpm, max HR of 190 bpm, and avg HR of 74 bpm. Predominant underlying rhythm was Sinus Rhythm. 3 Supraventricular Tachycardia runs occurred, the run  with the fastest interval lasting 6 beats with a max rate of 190 bpm, the  longest lasting 8 beats with an avg rate of 116 bpm. Isolated SVEs were rare (<1.0%), SVE Couplets were rare (<1.0%), and SVE Triplets were rare (<1.0%). Isolated VEs were rare (<1.0%), and no VE Couplets or VE Triplets were present.  Summary: NSR averat HR 74 bpm limited SVT longest only 8 beats No significant arrhythmias to cause syncope   TTE 05/14/20: 1. Left ventricular ejection fraction, by estimation, is 60 to 65%. The  left ventricle has normal function. The left ventricle has no regional  wall motion abnormalities. There is mild concentric left ventricular  hypertrophy. Left ventricular diastolic  parameters were normal.  2. Right ventricular systolic function is normal. The right ventricular  size is normal.  3. The mitral valve is normal in structure. Trivial mitral valve  regurgitation.  4. The aortic valve is tricuspid. Aortic valve regurgitation is not  visualized. No aortic stenosis is present.  5. The inferior vena cava is normal in size with greater than 50%  respiratory variability, suggesting right atrial pressure of 3 mmHg.   Cath 08/23/18: . Mid RCA lesion is 20% stenosed. . Mid Cx lesion is 10% stenosed. . Mid LAD lesion is 30% stenosed.  1. Mild non-obstructive CAD 2. Normal filling pressures  Recommendations: Medical management of mild CAD )      Anesthesia Quick Evaluation

## 2020-12-02 NOTE — Progress Notes (Signed)
Anesthesia Chart Review: Same day workup  Patient has been undergoing work-up for seizure-like activity.  He has not had numerous EEGs and evaluations.  He recently underwent multi day video EEG study at [facility] in August 2022.  Diagnosis was functional neurologic disorder.  Per impression/summary from Dr. Joanie Coddington in discharge note 09/24/2020, "Imp/Summary: this video EEG study was diagnostic of functional neurological disorder with seizures based on one typical spell captured. There was no evidence to suggest concomitant epilepsy, including with taper of lamotrigine and clonazepam and >24 hours of recording with these medications fully held. He was discharged with educational materials and plan for counseling/CBT specifically directed at functional neurological disorder. Efforts to coordinate care with pt's mental health team are outlined. Neither lamotrigine nor clonazepam are needed for seizure prevention; defer to psychiatry regarding next steps in management with these medications/to evaluate whether they are needed from a psychiatric perspective."  Previous cardiology eval due to history of sarcoidosis, hypertension, and chest discomfort.  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  Cardiac MRI 01/24/2020 showed normal EF 63% and no evidence of cardiac sarcoid. he presented to [facility] ED on 05/07/2020 for evaluation of sudden nausea and chest discomfort while at work with associated syncope. He did report falling backwards and struck his head. Labs showed WBC 6.7, Hgb 14.2, platelets 316, Na+ 139, K+ 3.5 and creatinine 1.23. Initial and delta HS troponin values negative at 3 and D-dimer negative. CT Head showed no acute intracranial abnormalities. CTA chest with no evidence of a PE. EKG showed NSR, HR 98 with LAD and no acute ST abnormalities. He was monitored on telemetry and maintained NSR  Subsequent outpatient monitor 05/22/20 showed no significant arrhythmia.  He was last seen by Dr. Johnsie Cancel on  10/27/2020 advised to follow-up with cardiology on an as-needed basis.  History of pulmonary sarcoidosis.  He has had thorough work-up with pulmonology.  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  Was started on prednisone summer 2021 and had a repeat CT scan which showed reduction in his mediastinal adenopathy. Prednisone made his symptoms worse so he was tapered off this in December 2021.  In November 2020 when he had PFTs showing normal spirometry, lung volumes and diffusion capacity.  Last seen by pulmonologist Dr. Shearon Stalls 04/15/2020.  At that time sarcoidosis was felt to be in remission and he was recommended to continue albuterol as needed and follow-up in 6 months.  CMP and CBC from ED visit (for pseudoseizure) 11/08/2020 reviewed, unremarkable.  Patient will need day of surgery evaluation.  EKG 08/17/20: Sinus rhythm. Rate 68. Left axis deviation. Abnormal R-wave progression, early transition. No significant change since last tracing  Zio Patch 05/26/20: Patch Wear Time:  4 days and 11 hours (2022-04-08T16:02:48-0400 to 2022-04-13T03:32:35-0400)   Patient had a min HR of 52 bpm, max HR of 190 bpm, and avg HR of 74 bpm. Predominant underlying rhythm was Sinus Rhythm. 3 Supraventricular Tachycardia runs occurred, the run with the fastest interval lasting 6 beats with a max rate of 190 bpm, the  longest lasting 8 beats with an avg rate of 116 bpm. Isolated SVEs were rare (<1.0%), SVE Couplets were rare (<1.0%), and SVE Triplets were rare (<1.0%). Isolated VEs were rare (<1.0%), and no VE Couplets or VE Triplets were present.   Summary:  NSR averat HR 74 bpm limited SVT longest only 8 beats No significant arrhythmias to cause syncope   TTE  05/14/20:  1. Left ventricular ejection fraction, by estimation, is 60 to 65%. The  left ventricle has normal function. The left ventricle has no regional  wall  motion abnormalities. There is mild concentric left ventricular  hypertrophy. Left ventricular diastolic  parameters were normal.   2. Right ventricular systolic function is normal. The right ventricular  size is normal.   3. The mitral valve is normal in structure. Trivial mitral valve  regurgitation.   4. The aortic valve is tricuspid. Aortic valve regurgitation is not  visualized. No aortic stenosis is present.   5. The inferior vena cava is normal in size with greater than 50%  respiratory variability, suggesting right atrial pressure of 3 mmHg.   Cath 08/23/18: Mid RCA lesion is 20% stenosed. Mid Cx lesion is 10% stenosed. Mid LAD lesion is 30% stenosed.   1. Mild non-obstructive CAD 2. Normal filling pressures   Recommendations: Medical management of mild CAD    Wynonia Musty [facility] Short Stay [facility]/Anesthesiology Phone [phone] 12/02/2020 4:07 PM

## 2020-12-03 ENCOUNTER — Other Ambulatory Visit: Payer: Self-pay

## 2020-12-03 ENCOUNTER — Encounter (HOSPITAL_COMMUNITY)
Admission: RE | Disposition: A | Discharge status: Home / Self Care | Payer: Self-pay | Source: Home / Self Care | Attending: Otolaryngology

## 2020-12-03 ENCOUNTER — Ambulatory Visit (HOSPITAL_COMMUNITY): Payer: 59 | Admitting: Physician Assistant

## 2020-12-03 ENCOUNTER — Encounter (HOSPITAL_COMMUNITY): Payer: Self-pay | Admitting: Otolaryngology

## 2020-12-03 ENCOUNTER — Ambulatory Visit (HOSPITAL_COMMUNITY)
Admission: RE | Admit: 2020-12-03 | Discharge: 2020-12-03 | Disposition: A | Discharge status: Home / Self Care | Payer: 59 | Attending: Otolaryngology | Admitting: Otolaryngology

## 2020-12-03 DIAGNOSIS — J343 Hypertrophy of nasal turbinates: Secondary | ICD-10-CM | POA: Insufficient documentation

## 2020-12-03 DIAGNOSIS — J329 Chronic sinusitis, unspecified: Secondary | ICD-10-CM | POA: Insufficient documentation

## 2020-12-03 DIAGNOSIS — J342 Deviated nasal septum: Secondary | ICD-10-CM | POA: Diagnosis not present

## 2020-12-03 HISTORY — PX: NASAL SEPTOPLASTY W/ TURBINOPLASTY: SHX2070

## 2020-12-03 HISTORY — PX: SINUS ENDO W/FUSION: SHX777

## 2020-12-03 SURGERY — SINUS SURGERY, ENDOSCOPIC, USING COMPUTER-ASSISTED NAVIGATION
Anesthesia: General | Site: Nose | Laterality: Bilateral

## 2020-12-03 MED ORDER — CHLORHEXIDINE GLUCONATE 0.12 % MT SOLN: 15.0000 mL | Freq: Once | OROMUCOSAL | Status: AC

## 2020-12-03 MED ORDER — MUPIROCIN 2 % EX OINT
TOPICAL_OINTMENT | CUTANEOUS | Status: AC
  Filled 2020-12-03: qty 22

## 2020-12-03 MED ORDER — OXYMETAZOLINE HCL 0.05 % NA SOLN
NASAL | Status: AC
  Filled 2020-12-03: qty 30

## 2020-12-03 MED ORDER — MUPIROCIN 2 % EX OINT
TOPICAL_OINTMENT | CUTANEOUS | Status: DC | PRN
  Administered 2020-12-03: 1 via NASAL

## 2020-12-03 MED ORDER — TRIAMCINOLONE ACETONIDE 40 MG/ML IJ SUSP
INTRAMUSCULAR | Status: AC
  Filled 2020-12-03: qty 5

## 2020-12-03 MED ORDER — PHENYLEPHRINE 40 MCG/ML (10ML) SYRINGE FOR IV PUSH (FOR BLOOD PRESSURE SUPPORT)
PREFILLED_SYRINGE | INTRAVENOUS | Status: DC | PRN
  Administered 2020-12-03 (×6): 80 ug via INTRAVENOUS

## 2020-12-03 MED ORDER — OXYCODONE HCL 5 MG PO TABS: 5.0000 mg | ORAL_TABLET | Freq: Once | ORAL | Status: DC | PRN

## 2020-12-03 MED ORDER — LIDOCAINE-EPINEPHRINE 1 %-1:100000 IJ SOLN
INTRAMUSCULAR | Status: DC | PRN
  Administered 2020-12-03: 8 mL

## 2020-12-03 MED ORDER — MEPERIDINE HCL 25 MG/ML IJ SOLN: 6.2500 mg | INTRAMUSCULAR | Status: DC | PRN

## 2020-12-03 MED ORDER — FENTANYL CITRATE (PF) 250 MCG/5ML IJ SOLN
INTRAMUSCULAR | Status: AC
  Filled 2020-12-03: qty 5

## 2020-12-03 MED ORDER — OXYCODONE HCL 5 MG/5ML PO SOLN: 5.0000 mg | Freq: Once | ORAL | Status: DC | PRN

## 2020-12-03 MED ORDER — FENTANYL CITRATE (PF) 100 MCG/2ML IJ SOLN
INTRAMUSCULAR | Status: DC | PRN
  Administered 2020-12-03: 100 ug via INTRAVENOUS

## 2020-12-03 MED ORDER — MIDAZOLAM HCL 5 MG/5ML IJ SOLN
INTRAMUSCULAR | Status: DC | PRN
  Administered 2020-12-03: 2 mg via INTRAVENOUS

## 2020-12-03 MED ORDER — CEPHALEXIN 500 MG PO CAPS: 500.0000 mg | ORAL_CAPSULE | Freq: Three times a day (TID) | ORAL | 0 refills | Status: AC

## 2020-12-03 MED ORDER — DEXMEDETOMIDINE (PRECEDEX) IN NS 20 MCG/5ML (4 MCG/ML) IV SYRINGE
PREFILLED_SYRINGE | INTRAVENOUS | Status: DC | PRN
  Administered 2020-12-03: 8 ug via INTRAVENOUS
  Administered 2020-12-03: 4 ug via INTRAVENOUS

## 2020-12-03 MED ORDER — OXYMETAZOLINE HCL 0.05 % NA SOLN
NASAL | Status: DC | PRN
  Administered 2020-12-03: 1 via TOPICAL

## 2020-12-03 MED ORDER — ARTIFICIAL TEARS OPHTHALMIC OINT
TOPICAL_OINTMENT | OPHTHALMIC | Status: DC | PRN
  Administered 2020-12-03: 1 via OPHTHALMIC

## 2020-12-03 MED ORDER — DROPERIDOL 2.5 MG/ML IJ SOLN: 0.6250 mg | Freq: Once | INTRAMUSCULAR | Status: DC | PRN

## 2020-12-03 MED ORDER — CHLORHEXIDINE GLUCONATE 0.12 % MT SOLN
OROMUCOSAL | Status: AC
  Administered 2020-12-03: 15 mL via OROMUCOSAL
  Filled 2020-12-03: qty 15

## 2020-12-03 MED ORDER — ONDANSETRON HCL 4 MG/2ML IJ SOLN
INTRAMUSCULAR | Status: DC | PRN
  Administered 2020-12-03: 4 mg via INTRAVENOUS

## 2020-12-03 MED ORDER — PROPOFOL 10 MG/ML IV BOLUS
INTRAVENOUS | Status: DC | PRN
  Administered 2020-12-03: 200 mg via INTRAVENOUS

## 2020-12-03 MED ORDER — OXYMETAZOLINE HCL 0.05 % NA SOLN
NASAL | Status: DC | PRN
  Administered 2020-12-03: 2 via NASAL

## 2020-12-03 MED ORDER — ROCURONIUM BROMIDE 10 MG/ML (PF) SYRINGE
PREFILLED_SYRINGE | INTRAVENOUS | Status: DC | PRN
  Administered 2020-12-03: 50 mg via INTRAVENOUS

## 2020-12-03 MED ORDER — LIDOCAINE-EPINEPHRINE 1 %-1:100000 IJ SOLN
INTRAMUSCULAR | Status: AC
  Filled 2020-12-03: qty 1

## 2020-12-03 MED ORDER — SODIUM CHLORIDE 0.9 % IR SOLN
Status: DC | PRN
  Administered 2020-12-03: 1

## 2020-12-03 MED ORDER — MIDAZOLAM HCL 2 MG/2ML IJ SOLN
INTRAMUSCULAR | Status: AC
  Filled 2020-12-03: qty 2

## 2020-12-03 MED ORDER — PROPOFOL 10 MG/ML IV BOLUS
INTRAVENOUS | Status: AC
  Filled 2020-12-03: qty 40

## 2020-12-03 MED ORDER — DEXAMETHASONE SODIUM PHOSPHATE 10 MG/ML IJ SOLN
INTRAMUSCULAR | Status: DC | PRN
  Administered 2020-12-03: 10 mg via INTRAVENOUS

## 2020-12-03 MED ORDER — ORAL CARE MOUTH RINSE: 15.0000 mL | Freq: Once | OROMUCOSAL | Status: AC

## 2020-12-03 MED ORDER — CEFAZOLIN SODIUM-DEXTROSE 2-3 GM-%(50ML) IV SOLR
INTRAVENOUS | Status: DC | PRN
  Administered 2020-12-03: 2 g via INTRAVENOUS

## 2020-12-03 MED ORDER — SUGAMMADEX SODIUM 200 MG/2ML IV SOLN
INTRAVENOUS | Status: DC | PRN
Start: 2020-12-03 — End: 2020-12-03
  Administered 2020-12-03: 200 mg via INTRAVENOUS

## 2020-12-03 MED ORDER — FENTANYL CITRATE (PF) 100 MCG/2ML IJ SOLN: 25.0000 ug | INTRAMUSCULAR | Status: DC | PRN

## 2020-12-03 MED ORDER — LACTATED RINGERS IV SOLN: INTRAVENOUS | Status: DC

## 2020-12-03 MED ORDER — LIDOCAINE 2% (20 MG/ML) 5 ML SYRINGE
INTRAMUSCULAR | Status: DC | PRN
  Administered 2020-12-03: 100 mg via INTRAVENOUS

## 2020-12-03 SURGICAL SUPPLY — 52 items
ATTRACTOMAT 16X20 MAGNETIC DRP (DRAPES) IMPLANT
BAG COUNTER SPONGE SURGICOUNT (BAG) ×3 IMPLANT
BAG SPNG CNTER NS LX DISP (BAG) ×2
BLADE ROTATE RAD 40 4 M4 (BLADE) IMPLANT
BLADE ROTATE TRICUT 4X13 M4 (BLADE) ×3 IMPLANT
BLADE SURG 15 STRL LF DISP TIS (BLADE) IMPLANT
BLADE SURG 15 STRL SS (BLADE)
CANISTER SUCT 3000ML PPV (MISCELLANEOUS) ×6 IMPLANT
COAGULATOR SUCT SWTCH 10FR 6 (ELECTROSURGICAL) IMPLANT
DRAPE HALF SHEET 40X57 (DRAPES) IMPLANT
DRESSING NASAL KENNEDY 3.5X.9 (MISCELLANEOUS) IMPLANT
DRSG NASAL KENNEDY 3.5X.9 (MISCELLANEOUS)
ELECT COATED BLADE 2.86 ST (ELECTRODE) IMPLANT
ELECT REM PT RETURN 9FT ADLT (ELECTROSURGICAL) ×3
ELECTRODE REM PT RTRN 9FT ADLT (ELECTROSURGICAL) ×2 IMPLANT
FILTER ARTHROSCOPY CONVERTOR (FILTER) ×3 IMPLANT
GAUZE SPONGE 2X2 8PLY STRL LF (GAUZE/BANDAGES/DRESSINGS) ×2 IMPLANT
GLOVE SURG ENC TEXT LTX SZ7 (GLOVE) ×6 IMPLANT
GOWN STRL REUS W/ TWL LRG LVL3 (GOWN DISPOSABLE) ×4 IMPLANT
GOWN STRL REUS W/TWL LRG LVL3 (GOWN DISPOSABLE) ×6
IV NS 1000ML (IV SOLUTION) ×3
IV NS 1000ML BAXH (IV SOLUTION) ×2 IMPLANT
KIT BASIN OR (CUSTOM PROCEDURE TRAY) ×3 IMPLANT
KIT TURNOVER KIT B (KITS) ×3 IMPLANT
NDL 18GX1X1/2 (RX/OR ONLY) (NEEDLE) IMPLANT
NDL HYPO 25GX1X1/2 BEV (NEEDLE) ×1 IMPLANT
NEEDLE 18GX1X1/2 (RX/OR ONLY) (NEEDLE) IMPLANT
NEEDLE HYPO 25GX1X1/2 BEV (NEEDLE) ×3 IMPLANT
NS IRRIG 1000ML POUR BTL (IV SOLUTION) ×3 IMPLANT
PAD ARMBOARD 7.5X6 YLW CONV (MISCELLANEOUS) ×6 IMPLANT
PENCIL SMOKE EVACUATOR (MISCELLANEOUS) IMPLANT
SPECIMEN JAR SMALL (MISCELLANEOUS) ×3 IMPLANT
SPLINT NASAL DOYLE BI-VL (GAUZE/BANDAGES/DRESSINGS) ×3 IMPLANT
SPONGE GAUZE 2X2 STER 10/PKG (GAUZE/BANDAGES/DRESSINGS) ×1
SPONGE NEURO XRAY DETECT 1X3 (DISPOSABLE) ×3 IMPLANT
SUT ETHILON 3 0 FSL (SUTURE) IMPLANT
SUT ETHILON 3 0 PS 1 (SUTURE) ×3 IMPLANT
SUT PLAIN 4 0 ~~LOC~~ 1 (SUTURE) ×3 IMPLANT
SWAB COLLECTION DEVICE MRSA (MISCELLANEOUS) IMPLANT
SWAB CULTURE ESWAB REG 1ML (MISCELLANEOUS) IMPLANT
SYR CONTROL 10ML LL (SYRINGE) ×1 IMPLANT
TOWEL GREEN STERILE FF (TOWEL DISPOSABLE) ×3 IMPLANT
TRACKER ENT INSTRUMENT (MISCELLANEOUS) ×3 IMPLANT
TRACKER ENT PATIENT (MISCELLANEOUS) ×5 IMPLANT
TRAP SPECIMEN MUCUS 40CC (MISCELLANEOUS) IMPLANT
TRAY ENT MC OR (CUSTOM PROCEDURE TRAY) ×3 IMPLANT
TUBE CONNECTING 12X1/4 (SUCTIONS) ×3 IMPLANT
TUBE SALEM SUMP 16 FR W/ARV (TUBING) ×3 IMPLANT
TUBING EXTENTION W/L.L. (IV SETS) ×3 IMPLANT
TUBING STRAIGHTSHOT EPS 5PK (TUBING) ×3 IMPLANT
WATER STERILE IRR 1000ML POUR (IV SOLUTION) ×3 IMPLANT
WIPE INSTRUMENT VISIWIPE 73X73 (MISCELLANEOUS) ×3 IMPLANT

## 2020-12-03 NOTE — H&P (Signed)
Mathew Moore is an 55 y.o. male.   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    Past Surgical History:  Procedure Laterality Date   BRONCHOSCOPY     CLEFT LIP REPAIR     COLONOSCOPY     HERNIA REPAIR Left    LEFT HEART CATH AND CORONARY ANGIOGRAPHY N/A 08/23/2018   Procedure: LEFT HEART CATH AND CORONARY ANGIOGRAPHY;  Surgeon: Burnell Blanks, MD;  Location: [place] [facility];  Service: Cardiovascular;  Laterality: N/A;   MEDIASTINOSCOPY N/A 07/20/2019   Procedure: MEDIASTINOSCOPY;  Surgeon: Melrose Nakayama, MD;  Location: [facility] OR;  Service: Thoracic;  Laterality: N/A;   NASAL SEPTUM SURGERY     PLANTAR FASCIA SURGERY Right    VIDEO BRONCHOSCOPY WITH ENDOBRONCHIAL ULTRASOUND N/A 07/06/2019   Procedure: VIDEO BRONCHOSCOPY WITH ENDOBRONCHIAL ULTRASOUND;  Surgeon: Marshell Garfinkel, MD;  Location: [place];  Service: Pulmonary;  Laterality: N/A;    Family History  Problem Relation Age of Onset   Breast cancer Mother    Heart disease Father    Lung cancer Father    Heart attack Father    Social History:  reports that he has never smoked. He has never used smokeless tobacco. He reports that he does not currently use alcohol. He reports that he does not currently use drugs.  Allergies: No Known Allergies  Facility-Administered Medications Prior to Admission  Medication Dose Route Frequency Provider Last Rate Last Admin   sodium chloride flush (NS) 0.9 % injection 3 mL  3 mL Intravenous Q12H Josue Hector, MD       Medications Prior to Admission  Medication Sig Dispense Refill   acetaminophen (TYLENOL) 500 MG tablet Take 1,000 mg by mouth every 6 (six)  hours as needed for moderate pain or headache.      albuterol (VENTOLIN HFA) 108 (90 Base) MCG/ACT inhaler INHALE 1 TO 2 PUFFS BY MOUTH EVERY 6 HOURS AS NEEDED FOR WHEEZING OR SHORTNESS OF BREATH 9 g 5   aspirin EC 81 MG tablet Take 81 mg by mouth in the morning. Swallow whole.     atorvastatin (LIPITOR) 40 MG tablet Take 40 mg by mouth in the morning.     baclofen (LIORESAL) 10 MG tablet Take 10-20 mg by mouth every 6 (six) hours as needed (headaches.).     Cholecalciferol (VITAMIN D) 50 MCG (2000 UT) CAPS Take 2,000 Units by mouth in the morning.     clonazePAM (KLONOPIN) 0.5 MG tablet Take 0.5-1 mg by mouth See admin instructions. Take 2 tablets (1 mg) by mouth with lunch & may take an additional dose in the evening if needed for anxiety.     diclofenac (VOLTAREN) 75 MG EC tablet Take 75 mg by mouth 2 (two) times daily as needed (plantar fasciitis).     fluticasone (FLONASE) 50 MCG/ACT nasal spray Place 2 sprays into both nostrils daily as needed for allergies.     lamoTRIgine (LAMICTAL) 150 MG tablet Take 150 mg by mouth at bedtime.     losartan (COZAAR) 25 MG tablet Take 25 mg by mouth in the morning.     omeprazole (PRILOSEC) 40 MG capsule  Take 1 capsule (40 mg total) by mouth in the morning and at bedtime. 60 capsule 11   testosterone cypionate (DEPOTESTOSTERONE CYPIONATE) 200 MG/ML injection Inject 200 mg into the muscle every 21 ( twenty-one) days.     zolpidem (AMBIEN CR) 6.25 MG CR tablet Take 6.25 mg by mouth at bedtime.     FLUoxetine (PROZAC) 40 MG capsule Take 40 mg by mouth daily.     lamoTRIgine (LAMICTAL) 25 MG tablet Take 2 tablets (50 mg total) by mouth 2 (two) times daily. (Patient not taking: Reported on 12/01/2020) 120 tablet 6    No results found for this or any previous visit (from the past 48 hour(s)). No results found.  Review of Systems  HENT:  Positive for congestion and sinus pressure.   Respiratory: Negative.    Cardiovascular: Negative.    Blood pressure  135/80, pulse 73, temperature 98.2 F (36.8 C), temperature source Oral, resp. rate 17, height 5\' 7"  (1.702 m), weight 95.3 kg, SpO2 99 %. Physical Exam Constitutional:      Appearance: He is normal weight.  HENT:     Nose:     Comments: Deviated septum Cardiovascular:     Rate and Rhythm: Normal rate.  Pulmonary:     Effort: Pulmonary effort is normal.  Musculoskeletal:     Cervical back: Normal range of motion.  Neurological:     Mental Status: He is alert.     Assessment/Plan Adm for OP septoplasty and ESS  Jerrell Belfast, MD 12/03/2020, 9:59 AM

## 2020-12-03 NOTE — Anesthesia Procedure Notes (Signed)
Procedure Name: Intubation Date/Time: 12/03/2020 10:19 AM Performed by: Vonna Drafts, CRNA Pre-anesthesia Checklist: Patient identified, Emergency Drugs available, Suction available and Patient being monitored Patient Re-evaluated:Patient Re-evaluated prior to induction Oxygen Delivery Method: Circle system utilized Preoxygenation: Pre-oxygenation with 100% oxygen Induction Type: IV induction Ventilation: Mask ventilation without difficulty Laryngoscope Size: Mac and 4 Grade View: Grade I Tube type: Oral Tube size: 7.5 mm Number of attempts: 1 Airway Equipment and Method: Stylet and Oral airway Placement Confirmation: ETT inserted through vocal cords under direct vision, positive ETCO2 and breath sounds checked- equal and bilateral Secured at: 23 cm Tube secured with: Tape Dental Injury: Teeth and Oropharynx as per pre-operative assessment

## 2020-12-03 NOTE — Transfer of Care (Signed)
Immediate Anesthesia Transfer of Care Note  Patient: Mathew Moore  Procedure(s) Performed: BILATERAL ENDOSCOPIC SINUS SURGERY WITH NAVIGATION (Bilateral: Nose) REVISION OF NASAL SEPTOPLASTY AND INFERIOR TURBINATE REDUCTION (Bilateral: Nose)  Patient Location: PACU  Anesthesia Type:General  Level of Consciousness: drowsy  Airway & Oxygen Therapy: Patient Spontanous Breathing and Patient connected to face mask oxygen  Post-op Assessment: Report given to RN and Post -op Vital signs reviewed and stable  Post vital signs: Reviewed and stable  Last Vitals:  Vitals Value Taken Time  BP 111/63 12/03/20 1203  Temp    Pulse 80 12/03/20 1203  Resp 17 12/03/20 1203  SpO2 95 % 12/03/20 1203  Vitals shown include unvalidated device data.  Last Pain:  Vitals:   12/03/20 0804  TempSrc:   PainSc: 0-No pain      Patients Stated Pain Goal: 0 (55/37/48 2707)  Complications: No notable events documented.

## 2020-12-03 NOTE — Op Note (Signed)
Operative Note:  ENDOSCOPIC SINUS SURGERY WITH NAVIGATION    SEPTOPLASTY    INFERIOR TURBINATE REDUCTION  Patient: Mathew Moore  Medical record number: [id number]  Date:12/03/2020  Pre-operative Indications: 1.  Chronic Sinusitis     2.  Severe nasal septal deviation, status post previous nasal septal surgery     3.  Bilateral inferior turbinate hypertrophy  Postoperative Indications: Same  Surgical Procedure: 1.  Bilateral endoscopic sinus surgery with intraoperative assisted navigation (fusion) consisting of: Bilateral anterior ethmoidectomy and bilateral maxillary antrostomy    2.  Revision nasal septoplasty    3. Bilateral Inferior Turbinate Reduction  Anesthesia: GET  Surgeon: Delsa Bern, M.D.  Complications: None  EBL: <100 cc cc  Findings: Severe nasal septal deviation with partial nasal airway obstruction after previous nasal airway surgery and bilateral inferior turbinate hypertrophy.  No nasal packing placed.  Bilateral Doyle nasal septal splints placed at the conclusion of the surgical procedure.   Brief History: The patient is a 55 y.o. male with a history of chronic sinusitis and turbinate hypertrophy. The patient has been on medical therapy to reduce nasal mucosal edema and infection including antibiotics, saline nasal spray and topical nasal steroids. Despite appropriate medical therapy the patient continues to have ongoing symptoms.  The patient had a history of previous nasal trauma and underwent nasal airway surgery more than 20 years ago.  He has had progressive issues with increasing nasal obstruction and residual worsening deviated septum.  CT scan of the sinuses was performed and showed severe septal deviation, turbinate hypertrophy and obstruction of the ostiomeatal complex bilaterally with mucosal thickening in the maxillary sinuses.  Given the patient's history and findings, the above surgical procedures were recommended, risks and benefits were  discussed in detail with the patient may understand and agree with our plan for surgery which is scheduled at [facility] in OR under general anesthesia as an outpatient.  Surgical Procedure: The patient is brought to the operating room on 12/03/2020 and placed in supine position on the operating table. General endotracheal anesthesia was established without difficulty. When the patient was adequately anesthetized, surgical timeout was performed with correct identification of the patient and the surgical procedure. The patient's nose was then injected with 8 cc of 1% lidocaine 1:100,000 dilution epinephrine which was injected in a submucosal fashion. The patient's nose was then packed with Afrin-soaked cottonoid pledgets were left in place for approximately 10 minutes to allow for  vasoconstriction and hemostasis.  The Xomed Fusion navigation headgear was applied in anatomic and surgical landmarks were identified and confirmed, navigation was used throughout the sinus component of the surgical procedure.  With the patient prepped draped and prepared for surgery, nasal nasal endoscopy was performed on the left side.  The uncinate process was identified and resected with through-cutting forceps.  An anterior ethmoidectomy was performed with microdebrider and 0 degree endoscope under direct visualization.  A 45 degree scope was used to visualize the lateral nasal wall and the ostium of the maxillary sinus which was occluded.  Using through-cutting forceps the ostium was enlarged in an anterior inferior direction create a widely patent ostium.  No evidence of polyps or infection within the sinus.  The patient's right nasal passageway was inspected with an endoscope and anterior ethmoidectomy and max antrostomy were performed in a similar fashion using microdebrider and through-cutting forceps.  The patient had undergone previous septoplasty and nasal airway surgery and had a severe residual left septal deviation  with anterior obstruction.  A left anterior hemitransfixion incision was created and a mucoperichondrial flap was elevated from anterior to posterior on the left-hand side. The anterior cartilaginous septum was crossed at the midline and a mucoperichondrial flap was elevated on the patient's right.  Deviated cartilage and scar tissue were resected bring the septum to good midline position.  The inferior anterior septum was then anchored to the anterior maxillary crest to fixate the septum in midline position.   With the septum brought to good midline position, the morselized cartilage was returned to the mucoperichondrial pocket and the soft tissue/mucosal flaps were reapproximated with interrupted 4-0 gut suture on a Keith needle in a horizontal mattressing fashion.  Anterior hemitransfixion incision was closed with the same stitch.  Bilateral Doyle nasal septal splints were then placed after the application of Bactroban ointment and sutured in position with a 3-0 Ethilon suture.  Attention was then turned to the inferior turbinates, bilateral inferior turbinate intramural cautery was performed with cautery setting at 54 W.  2 submucosal passes were made in each inferior turbinate.  After completing cautery, anterior vertical incisions were created and overlying soft tissue was elevated, a small amount of turbinate bone was resected.  The turbinates were then outfractured to create a more patent nasal passageway.  No nasal packing was placed .  Surgical sponge count was correct. An oral gastric tube was passed and the stomach contents were aspirated. Patient was awakened from anesthetic and transferred from the operating room to the recovery room in stable condition. There were no complications and blood loss was less than 100 cc.   Delsa Bern, M.D. [facility] 12/03/2020

## 2020-12-04 ENCOUNTER — Encounter (HOSPITAL_COMMUNITY): Payer: Self-pay | Admitting: Otolaryngology

## 2020-12-04 LAB — SURGICAL PATHOLOGY

## 2020-12-04 NOTE — Anesthesia Postprocedure Evaluation (Signed)
Anesthesia Post Note  Patient: Mathew Moore  Procedure(s) Performed: BILATERAL ENDOSCOPIC SINUS SURGERY WITH NAVIGATION (Bilateral: Nose) REVISION OF NASAL SEPTOPLASTY AND INFERIOR TURBINATE REDUCTION (Bilateral: Nose)     Patient location during evaluation: PACU Anesthesia Type: General Level of consciousness: sedated and patient cooperative Pain management: pain level controlled Vital Signs Assessment: post-procedure vital signs reviewed and stable Respiratory status: spontaneous breathing Cardiovascular status: stable Anesthetic complications: no   No notable events documented.  Last Vitals:  Vitals:   12/03/20 1220 12/03/20 1235  BP: 110/63 113/66  Pulse: 84 74  Resp: 20 20  Temp:  36.5 C  SpO2: 91% 93%    Last Pain:  Vitals:   12/03/20 1235  TempSrc:   PainSc: 0-No pain                 Nolon Nations

## 2020-12-13 ENCOUNTER — Other Ambulatory Visit: Payer: Self-pay | Admitting: Internal Medicine

## 2020-12-13 ENCOUNTER — Emergency Department (HOSPITAL_COMMUNITY): Payer: 59

## 2020-12-13 ENCOUNTER — Emergency Department (HOSPITAL_COMMUNITY)
Admission: EM | Admit: 2020-12-13 | Discharge: 2020-12-13 | Disposition: A | Discharge status: Home / Self Care | Payer: 59 | Attending: Emergency Medicine | Admitting: Emergency Medicine

## 2020-12-13 ENCOUNTER — Encounter (HOSPITAL_COMMUNITY): Payer: Self-pay | Admitting: Emergency Medicine

## 2020-12-13 DIAGNOSIS — I10 Essential (primary) hypertension: Secondary | ICD-10-CM | POA: Diagnosis not present

## 2020-12-13 DIAGNOSIS — R569 Unspecified convulsions: Secondary | ICD-10-CM | POA: Diagnosis not present

## 2020-12-13 DIAGNOSIS — I2510 Atherosclerotic heart disease of native coronary artery without angina pectoris: Secondary | ICD-10-CM | POA: Diagnosis not present

## 2020-12-13 DIAGNOSIS — Z7982 Long term (current) use of aspirin: Secondary | ICD-10-CM | POA: Diagnosis not present

## 2020-12-13 DIAGNOSIS — K219 Gastro-esophageal reflux disease without esophagitis: Secondary | ICD-10-CM

## 2020-12-13 LAB — CBC WITH DIFFERENTIAL/PLATELET
Abs Immature Granulocytes: 0.02 10*3/uL (ref 0.00–0.07)
Basophils Absolute: 0.1 10*3/uL (ref 0.0–0.1)
Basophils Relative: 1 %
Eosinophils Absolute: 0.2 10*3/uL (ref 0.0–0.5)
Eosinophils Relative: 3 %
HCT: 37.8 % — ABNORMAL LOW (ref 39.0–52.0)
Hemoglobin: 12.9 g/dL — ABNORMAL LOW (ref 13.0–17.0)
Immature Granulocytes: 0 %
Lymphocytes Relative: 36 %
Lymphs Abs: 2.3 10*3/uL (ref 0.7–4.0)
MCH: 30.4 pg (ref 26.0–34.0)
MCHC: 34.1 g/dL (ref 30.0–36.0)
MCV: 88.9 fL (ref 80.0–100.0)
Monocytes Absolute: 0.5 10*3/uL (ref 0.1–1.0)
Monocytes Relative: 8 %
Neutro Abs: 3.4 10*3/uL (ref 1.7–7.7)
Neutrophils Relative %: 52 %
Platelets: 339 10*3/uL (ref 150–400)
RBC: 4.25 MIL/uL (ref 4.22–5.81)
RDW: 13.4 % (ref 11.5–15.5)
WBC: 6.4 10*3/uL (ref 4.0–10.5)
nRBC: 0 % (ref 0.0–0.2)

## 2020-12-13 LAB — COMPREHENSIVE METABOLIC PANEL
ALT: 27 U/L (ref 0–44)
AST: 24 U/L (ref 15–41)
Albumin: 3.8 g/dL (ref 3.5–5.0)
Alkaline Phosphatase: 103 U/L (ref 38–126)
Anion gap: 5 (ref 5–15)
BUN: 19 mg/dL (ref 6–20)
CO2: 28 mmol/L (ref 22–32)
Calcium: 8.7 mg/dL — ABNORMAL LOW (ref 8.9–10.3)
Chloride: 106 mmol/L (ref 98–111)
Creatinine, Ser: 1.39 mg/dL — ABNORMAL HIGH (ref 0.61–1.24)
GFR, Estimated: >60 mL/min (ref >60)
Glucose, Bld: 139 mg/dL — ABNORMAL HIGH (ref 70–99)
Potassium: 3.4 mmol/L — ABNORMAL LOW (ref 3.5–5.1)
Sodium: 139 mmol/L (ref 135–145)
Total Bilirubin: 0.4 mg/dL (ref 0.3–1.2)
Total Protein: 6.6 g/dL (ref 6.5–8.1)

## 2020-12-13 MED ORDER — LORAZEPAM 2 MG/ML IJ SOLN
2.0000 mg | Freq: Once | INTRAMUSCULAR | Status: AC
  Administered 2020-12-13: 2 mg via INTRAVENOUS
  Filled 2020-12-13: qty 1

## 2020-12-13 NOTE — ED Provider Notes (Signed)
[facility] EMERGENCY DEPARTMENT Provider Note   CSN: [id number] Arrival date & time: 12/13/20  2124     History Chief Complaint  Patient presents with   Seizures    Mathew Moore is a 55 y.o. male with a past medical history of psychogenic epileptic seizures presenting tonight via EMS for seizure onset tonight.  Patient reports that he was coming out of the shower when he fell to the ground.  When patient woke up he had blood on a washcloth, went to show his wife, and fell to the ground again.  Per patient wife patient was banging his head on the ground.  Patient without postictal symptoms, no confusion, no memory loss, no somnolence.  Patient denies shortness of breath, abdominal pain, nausea, vomiting, vision changes, or headache.  He has a neurologist and takes lamotrigine for his seizures.  This recent visit with his neurologist was 11/24/2020.  Patient started CBT this week.  Patient on multiple psychiatric medications.  Per chart review patient recently had sinus surgery on 12/03/2020.  The history is provided by the patient and the spouse. No language interpreter was used.      Past Medical History:  Diagnosis Date   Anxiety    ocassional   Atypical mole 08/24/2019   mild- right lower leg - anterior   GERD (gastroesophageal reflux disease)    Hypertension    Sarcoidosis 07/2019   Seizures ([place]) 05/30/2020   Pschogenic nonepileptic, last one 11/28/20    Patient Active Problem List   Diagnosis Date Noted   Sinusitis, chronic 06/11/2020   Deviated septum 06/11/2020   Nasal turbinate hypertrophy 06/11/2020   Essential hypertension 05/31/2020   Observed seizure-like activity ([place]) 05/30/2020   Syncope 05/14/2020   Hypokalemia 05/14/2020   Anxiety    Seizure-like activity ([place])    Sarcoidosis 08/10/2019   CAD (coronary artery disease) 07/2019   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   Lymphadenopathy, mediastinal 06/11/2019   Otorrhea, right 12/07/2018   Abnormal stress test     Chest pain    Encounter for screening colonoscopy 01/18/2018   Acute suppurative otitis media of left ear without spontaneous rupture of tympanic membrane 09/07/2017   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/22/2016   Eustachian tube dysfunction, bilateral 07/29/2015   Myringotomy tube status 07/29/2015   Dyspnea 04/02/2013    Past Surgical History:  Procedure Laterality Date   BRONCHOSCOPY     CLEFT LIP REPAIR     COLONOSCOPY     HERNIA REPAIR Left    LEFT HEART CATH AND CORONARY ANGIOGRAPHY N/A 08/23/2018   Procedure: LEFT HEART CATH AND CORONARY ANGIOGRAPHY;  Surgeon: Burnell Blanks, MD;  Location: [place] [facility];  Service: Cardiovascular;  Laterality: N/A;   MEDIASTINOSCOPY N/A 07/20/2019   Procedure: MEDIASTINOSCOPY;  Surgeon: Melrose Nakayama, MD;  Location: [place];  Service: Thoracic;  Laterality: N/A;   NASAL SEPTOPLASTY W/ TURBINOPLASTY Bilateral 12/03/2020   Procedure: REVISION OF NASAL SEPTOPLASTY AND INFERIOR TURBINATE REDUCTION;  Surgeon: Jerrell Belfast, MD;  Location: [place];  Service: ENT;  Laterality: Bilateral;   NASAL SEPTUM SURGERY     PLANTAR FASCIA SURGERY Right    SINUS ENDO W/FUSION Bilateral 12/03/2020   Procedure: BILATERAL ENDOSCOPIC SINUS SURGERY WITH NAVIGATION;  Surgeon: Jerrell Belfast, MD;  Location: [place];  Service: ENT;  Laterality: Bilateral;   VIDEO BRONCHOSCOPY WITH ENDOBRONCHIAL ULTRASOUND N/A 07/06/2019   Procedure: VIDEO BRONCHOSCOPY WITH ENDOBRONCHIAL ULTRASOUND;  Surgeon: Marshell Garfinkel,  MD;  Location: [place];  Service: Pulmonary;  Laterality: N/A;       Family History  Problem Relation Age of Onset   Breast cancer Mother    Heart disease Father    Lung cancer Father    Heart attack Father     Social History   Tobacco Use   Smoking status: Never   Smokeless tobacco: Never  Vaping Use   Vaping Use: Never used  Substance Use Topics   Alcohol use: Not Currently   Drug  use: Not Currently    Home Medications Prior to Admission medications   Medication Sig Start Date End Date Taking? Authorizing Provider  acetaminophen (TYLENOL) 500 MG tablet Take 1,000 mg by mouth every 6 (six) hours as needed for moderate pain or headache.     [provider]  albuterol (VENTOLIN HFA) 108 (90 Base) MCG/ACT inhaler INHALE 1 TO 2 PUFFS BY MOUTH EVERY 6 HOURS AS NEEDED FOR WHEEZING OR SHORTNESS OF BREATH 11/10/20   Martyn Ehrich, NP  aspirin EC 81 MG tablet Take 81 mg by mouth in the morning. Swallow whole.    [provider]  atorvastatin (LIPITOR) 40 MG tablet Take 40 mg by mouth in the morning.    [provider]  baclofen (LIORESAL) 10 MG tablet Take 10-20 mg by mouth every 6 (six) hours as needed (headaches.).    [provider]  Cholecalciferol (VITAMIN D) 50 MCG (2000 UT) CAPS Take 2,000 Units by mouth in the morning.    [provider]  clonazePAM (KLONOPIN) 0.5 MG tablet Take 0.5-1 mg by mouth See admin instructions. Take 2 tablets (1 mg) by mouth with lunch & may take an additional dose in the evening if needed for anxiety.    [provider]  diclofenac (VOLTAREN) 75 MG EC tablet Take 75 mg by mouth 2 (two) times daily as needed (plantar fasciitis). 07/29/20   [provider]  FLUoxetine (PROZAC) 40 MG capsule Take 40 mg by mouth daily. 11/12/20   [provider]  fluticasone (FLONASE) 50 MCG/ACT nasal spray Place 2 sprays into both nostrils daily as needed for allergies. 08/14/20   [provider]  lamoTRIgine (LAMICTAL) 150 MG tablet Take 150 mg by mouth at bedtime. 11/12/20   [provider]  lamoTRIgine (LAMICTAL) 25 MG tablet Take 2 tablets (50 mg total) by mouth 2 (two) times daily. Patient not taking: Reported on 12/01/2020 06/10/20   Penumalli, Earlean Polka, MD  losartan (COZAAR) 25 MG tablet Take 25 mg by mouth in the morning. 05/16/20   [provider]  omeprazole  (PRILOSEC) 40 MG capsule Take 1 capsule (40 mg total) by mouth in the morning and at bedtime. 11/20/19   Spero Geralds, MD  testosterone cypionate (DEPOTESTOSTERONE CYPIONATE) 200 MG/ML injection Inject 200 mg into the muscle every 21 ( twenty-one) days. 05/01/20   [provider]  zolpidem (AMBIEN CR) 6.25 MG CR tablet Take 6.25 mg by mouth at bedtime. 11/19/20   [provider]    Allergies    Patient has no known allergies.  Review of Systems   Review of Systems  Constitutional:  Negative for chills and fever.  HENT:         + Pain to nasal bridge.  Eyes:  Negative for visual disturbance.  Respiratory:  Negative for shortness of breath.        + Chest wall pain  Cardiovascular:  Negative for chest pain.  Gastrointestinal:  Negative for abdominal pain, nausea and vomiting.  Skin:  Positive for wound (Incised wound to forehead).  Neurological:  Positive for seizures. Negative for dizziness, light-headedness and headaches.  All other systems reviewed and are negative.  Physical Exam Updated Vital Signs BP 120/60 (BP Location: Right Arm)   Pulse (!) 112   Temp 97.9 F (36.6 C) (Oral)   Resp (!) 24   Ht 5\' 7"  (1.702 m)   Wt 95.3 kg   SpO2 98%   BMI 32.89 kg/m   Physical Exam Vitals and nursing note reviewed.  Constitutional:      General: He is not in acute distress.    Appearance: He is not diaphoretic.  HENT:     Head: Normocephalic and atraumatic.     Nose:     Comments: Tenderness to palpation to nasal bridge.    Mouth/Throat:     Mouth: Mucous membranes are moist.     Pharynx: Oropharynx is clear. No oropharyngeal exudate.  Eyes:     General: Vision grossly intact. Gaze aligned appropriately. No scleral icterus.    Extraocular Movements: Extraocular movements intact.     Conjunctiva/sclera: Conjunctivae normal.  Cardiovascular:     Rate and Rhythm: Normal rate and regular rhythm.     Pulses: Normal pulses.     Heart sounds: Normal heart  sounds.  Pulmonary:     Effort: Pulmonary effort is normal. No respiratory distress.     Breath sounds: Normal breath sounds. No wheezing.  Abdominal:     General: Bowel sounds are normal.     Palpations: Abdomen is soft. There is no mass.     Tenderness: There is no abdominal tenderness. There is no guarding or rebound.  Musculoskeletal:        General: Normal range of motion.     Cervical back: Normal range of motion and neck supple.  Skin:    General: Skin is warm and dry.     Findings: Laceration present. No ecchymosis.     Comments: 2 cm laceration noted to forehead.  No surrounding ecchymosis.  Neurological:     General: No focal deficit present.     Mental Status: He is alert.     Sensory: No sensory deficit.     Motor: No weakness.     Comments: Bilateral grip strength intact.    ED Results / Procedures / Treatments   Labs (all labs ordered are listed, but only abnormal results are displayed) Labs Reviewed  COMPREHENSIVE METABOLIC PANEL - Abnormal; Notable for the following components:      Result Value   Potassium 3.4 (*)    Glucose, Bld 139 (*)    Creatinine, Ser 1.39 (*)    Calcium 8.7 (*)    All other components within normal limits  CBC WITH DIFFERENTIAL/PLATELET - Abnormal; Notable for the following components:   Hemoglobin 12.9 (*)    HCT 37.8 (*)    All other components within normal limits    EKG None  Radiology CT Head Wo Contrast  Result Date: 12/13/2020 CLINICAL DATA:  Seizure.  Facial trauma. EXAM: CT HEAD WITHOUT CONTRAST CT MAXILLOFACIAL WITHOUT CONTRAST TECHNIQUE: Multidetector CT imaging of the head and maxillofacial structures were performed using the standard protocol without intravenous contrast. Multiplanar CT image reconstructions of the maxillofacial structures were also generated. COMPARISON:  CT head 11/08/2020. FINDINGS: CT HEAD FINDINGS Brain: No evidence of acute infarction, hemorrhage, hydrocephalus, extra-axial collection or mass  lesion/mass effect. Vascular: No hyperdense vessel or unexpected  calcification. Skull: Normal. Negative for fracture or focal lesion. Other: None. CT MAXILLOFACIAL FINDINGS Osseous: No fracture or mandibular dislocation. No destructive process. Orbits: Negative. No traumatic or inflammatory finding. Sinuses: There is mucosal thickening of the bilateral maxillary sinuses, right greater than left. No air-fluid levels are seen. The mastoid air cells are clear. Soft tissues: Negative. IMPRESSION: 1. No acute intracranial process. 2. No acute facial fracture. Electronically Signed   By: Ronney Asters M.D.   On: 12/13/2020 23:09   CT Maxillofacial Wo Contrast  Result Date: 12/13/2020 CLINICAL DATA:  Seizure.  Facial trauma. EXAM: CT HEAD WITHOUT CONTRAST CT MAXILLOFACIAL WITHOUT CONTRAST TECHNIQUE: Multidetector CT imaging of the head and maxillofacial structures were performed using the standard protocol without intravenous contrast. Multiplanar CT image reconstructions of the maxillofacial structures were also generated. COMPARISON:  CT head 11/08/2020. FINDINGS: CT HEAD FINDINGS Brain: No evidence of acute infarction, hemorrhage, hydrocephalus, extra-axial collection or mass lesion/mass effect. Vascular: No hyperdense vessel or unexpected calcification. Skull: Normal. Negative for fracture or focal lesion. Other: None. CT MAXILLOFACIAL FINDINGS Osseous: No fracture or mandibular dislocation. No destructive process. Orbits: Negative. No traumatic or inflammatory finding. Sinuses: There is mucosal thickening of the bilateral maxillary sinuses, right greater than left. No air-fluid levels are seen. The mastoid air cells are clear. Soft tissues: Negative. IMPRESSION: 1. No acute intracranial process. 2. No acute facial fracture. Electronically Signed   By: Ronney Asters M.D.   On: 12/13/2020 23:09    Procedures Procedures   Medications Ordered in ED Medications  LORazepam (ATIVAN) injection 2 mg (2 mg  Intravenous Given 12/13/20 2212)    ED Course  I have reviewed the triage vital signs and the nursing notes.  Pertinent labs & imaging results that were available during my care of the patient were reviewed by me and considered in my medical decision making (see chart for details).   9:54 PM -RN notified wife at bedside.  Requesting facial CT due to recent sinus surgery on 12/03/2020.    MDM Rules/Calculators/A&P                           Presents via EMS complaining of seizures onset tonight.  Patient with a history of psychogenic nonepileptic seizures. Differential diagnosis includes psychogenic nonepileptic seizure, focal/generalized seizures, syncope/dysrhythmia, electrolyte abnormality.  RN notified of possible seizure-like activity upon arrival to the ED.  Patient with arm flopping movements, however following patient without confusion, memory loss, or somnolence.  Patient able to detail what occurred prior to arrival.  Less likely concern for focal or generalized seizures.  Patient given Ativan in the ED.  Patient with no evidence of focal neuro deficits on physical exam and is at mental baseline. Patient with 2 cm open incised wound to forehead, not requiring repair at this time.   Labs without any acute abnormality.  Seizure-like activity less likely due to electrolyte abnormality. EKG without acute ST/T changes, symptoms less likely due to dysrhythmia. CT sinuses and CT head ordered due to patient mechanism of banging head on floor and recent sinus surgery.  Imaging studies show no acute abnormalities.  Likely acute episode of psychogenic nonepileptic seizures.  Patient with increased stressors at home and work.    Patient is advised to follow-up with neurologist in regards to today's event.  Spoke with patient and family in detail regarding imaging studies.  Patient verbalizes understanding.  Patient is hemodynamically stable in no acute distress prior to discharge.  Final Clinical  Impression(s) / ED Diagnoses Final diagnoses:  Seizure-like activity [facility])    Rx / DC Orders ED Discharge Orders     None        Emaya Preston A, PA-C 12/13/20 2348    Noemi Chapel, MD 12/14/20 1444

## 2020-12-13 NOTE — Discharge Instructions (Addendum)
Follow-up with your neurologist for further evaluation.  Return to the emergency department if you are experiencing increasing or worsening loss of consciousness, increased seizure-like activity, increasing or worsening symptoms.

## 2020-12-13 NOTE — ED Triage Notes (Signed)
Pt c/o seizure tonight which pt was banging his head on the floor causing laceration to forehead.   Pt has hx of pseudoseizure.   Pt given 2mg  versed by EMS.

## 2020-12-13 NOTE — ED Notes (Signed)
Patient had seizure like activity at 2131. Lasted 2 minutes. ED physician at bedside to evaluate. Seizure pads in place.

## 2020-12-13 NOTE — ED Notes (Signed)
Pt transported to CT ?

## 2020-12-22 ENCOUNTER — Other Ambulatory Visit: Payer: Self-pay | Admitting: Internal Medicine

## 2020-12-22 DIAGNOSIS — D869 Sarcoidosis, unspecified: Secondary | ICD-10-CM

## 2020-12-22 DIAGNOSIS — D860 Sarcoidosis of lung: Secondary | ICD-10-CM

## 2020-12-23 ENCOUNTER — Ambulatory Visit (INDEPENDENT_AMBULATORY_CARE_PROVIDER_SITE_OTHER): Payer: 59 | Admitting: Internal Medicine

## 2020-12-23 ENCOUNTER — Encounter: Payer: Self-pay | Admitting: Internal Medicine

## 2020-12-23 ENCOUNTER — Other Ambulatory Visit: Payer: Self-pay

## 2020-12-23 VITALS — BP 138/64 | HR 77 | Temp 98.0°F | Ht 68.0 in | Wt 211.6 lb

## 2020-12-23 DIAGNOSIS — D860 Sarcoidosis of lung: Secondary | ICD-10-CM

## 2020-12-23 LAB — PULMONARY FUNCTION TEST
DL/VA % pred: 129 %
DL/VA: 5.71 ml/min/mmHg/L
DLCO cor % pred: 118 %
DLCO cor: 31.5 ml/min/mmHg
DLCO unc % pred: 111 %
DLCO unc: 29.88 ml/min/mmHg
FEF 25-75 Post: 3.09 L/sec
FEF 25-75 Pre: 2.35 L/sec
FEF2575-%Change-Post: 31 %
FEF2575-%Pred-Post: 99 %
FEF2575-%Pred-Pre: 75 %
FEV1-%Change-Post: 5 %
FEV1-%Pred-Post: 84 %
FEV1-%Pred-Pre: 79 %
FEV1-Post: 2.97 L
FEV1-Pre: 2.82 L
FEV1FVC-%Change-Post: 5 %
FEV1FVC-%Pred-Pre: 101 %
FEV6-%Change-Post: 0 %
FEV6-%Pred-Post: 82 %
FEV6-%Pred-Pre: 81 %
FEV6-Post: 3.6 L
FEV6-Pre: 3.58 L
FEV6FVC-%Change-Post: 0 %
FEV6FVC-%Pred-Post: 104 %
FEV6FVC-%Pred-Pre: 103 %
FVC-%Change-Post: 0 %
FVC-%Pred-Post: 78 %
FVC-%Pred-Pre: 78 %
FVC-Post: 3.6 L
FVC-Pre: 3.6 L
Post FEV1/FVC ratio: 82 %
Post FEV6/FVC ratio: 100 %
Pre FEV1/FVC ratio: 78 %
Pre FEV6/FVC Ratio: 99 %
RV % pred: 120 %
RV: 2.36 L
TLC % pred: 95 %
TLC: 6.16 L

## 2020-12-23 NOTE — Patient Instructions (Signed)
Please schedule follow up scheduled with myself in 12 months.  If my schedule is not open yet, we will contact you with a reminder closer to that time.

## 2020-12-23 NOTE — Progress Notes (Signed)
WLLIAM GROSSO    [id number]    Aug 25, 1965  Primary Care Physician:Sasser, Silvestre Moment, MD Date of Appointment: 12/23/2020 Established Patient Visit  Chief complaint:   Chief Complaint  Patient presents with   Follow-up    Pft review    HPI: Mathew Moore is a 55 y.o. gentleman who presented with Pet-Avid hilar adenopathy. 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  Was started on prednisone over the summer and has had a repeat CT scan which shows reduction in his mediastinal adenopathy. Prednisone made his symptoms worse so he was tapered off this in December 2021.   Interval Updates: Doing well. Has been having "pseduoseizures." Has had eeg and seen 2 neurologists.   Needed albuterol a couple times over the last 6 months with heavy exertion  No chest tightness, wheezing, coughing.   I have reviewed the patient's family social and past medical history and updated as appropriate.   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    Past Surgical History:  Procedure Laterality Date   BRONCHOSCOPY     CLEFT LIP REPAIR     COLONOSCOPY     HERNIA REPAIR Left    LEFT HEART CATH AND CORONARY ANGIOGRAPHY N/A 08/23/2018   Procedure: LEFT HEART CATH AND CORONARY ANGIOGRAPHY;  Surgeon: Burnell Blanks, MD;  Location: [facility];  Service: Cardiovascular;  Laterality: N/A;   MEDIASTINOSCOPY N/A 07/20/2019   Procedure: MEDIASTINOSCOPY;  Surgeon: Melrose Nakayama, MD;  Location: [facility];  Service: Thoracic;  Laterality: N/A;   NASAL SEPTOPLASTY W/ TURBINOPLASTY Bilateral 12/03/2020   Procedure: REVISION OF NASAL SEPTOPLASTY  AND INFERIOR TURBINATE REDUCTION;  Surgeon: Jerrell Belfast, MD;  Location: [facility] [place];  Service: ENT;  Laterality: Bilateral;   NASAL SEPTUM SURGERY     PLANTAR FASCIA SURGERY Right    SINUS ENDO W/FUSION Bilateral 12/03/2020   Procedure: BILATERAL ENDOSCOPIC SINUS SURGERY WITH NAVIGATION;  Surgeon: Jerrell Belfast, MD;  Location: [place];  Service: ENT;  Laterality: Bilateral;   VIDEO BRONCHOSCOPY WITH ENDOBRONCHIAL ULTRASOUND N/A 07/06/2019   Procedure: VIDEO BRONCHOSCOPY WITH ENDOBRONCHIAL ULTRASOUND;  Surgeon: Marshell Garfinkel, MD;  Location: [place];  Service: Pulmonary;  Laterality: N/A;    Family History  Problem Relation Age of Onset   Breast cancer Mother    Heart disease Father    Lung cancer Father    Heart attack Father     Social History   Occupational History   Not on file  Tobacco Use   Smoking status: Never   Smokeless tobacco: Never  Vaping Use   Vaping Use: Never used  Substance and Sexual Activity   Alcohol use: Not Currently   Drug use: Not Currently   Sexual activity: Not on file     Physical Exam: Blood pressure 138/64, pulse 77, temperature 98 F (36.7 C), temperature source Oral, height 5\' 8"  (1.727 m), weight 211 lb 9.6 oz (96 kg), SpO2 99 %.  Gen:      Well appearing, no distress Lungs:    ctab no wheezes or crackles, no increased wob CV:  RRR no mrg   Data Reviewed: Imaging: I have personally reviewed the PET scan may 2021 and it shows reduced hilar lymphadenopathy. Cardiac MRI - no evidence of delayed gadolinium enhancement or scarring consistent with cardiac sarcoidosis   PFTs: Obtained 12/23/20 Normal pulmonary function  Labs: Lab Results  Component Value Date   WBC 6.4 12/13/2020   HGB 12.9 (L) 12/13/2020   HCT 37.8 (L) 12/13/2020   MCV 88.9 12/13/2020   PLT 339 12/13/2020   Lab Results  Component Value Date   NA 139 12/13/2020   K 3.4 (L) 12/13/2020   CL 106 12/13/2020   CO2 28 12/13/2020   LFTs wnl  Sinus biopsy shows  mild inflammation  Immunization status: Immunization History  Administered Date(s) Administered   Influenza,inj,Quad PF,6+ Mos 11/25/2018, 10/23/2019   Influenza,inj,Quad PF,6-35 Mos 11/08/2020   Moderna Sars-Covid-2 Vaccination 03/28/2019, 04/25/2019   Tdap 11/08/2020   Reviewed notes from cardiology, pcp, neurology, ed  Assessment:  Pulmonary Sarcoidosis - in remission. Normal PFTs  Plan/Recommendations: Continue  albuterol as needed. I will see him back in one year.   He has already has flu shot.   Sarcoidosis evaluation: Baseline eye examination to evaluate for ocular sarcoidosis -saw ophthalmology fall 2021.  Baseline serum creatinine for renal sarcoidosis - wnl Baseline serum alkaline phosphatase for hepatic sarcoidosis - wnl Baseline serum calcium - wnl Baseline serum complete blood count - wnl Baseline EKG for cardiac sarcoidosis - reviewed June 2021. No IV conduction delay.   Reference: Diagnosis and Detection of Sarcoidosis: An Official [employer] [place], Iss 8, pp e26-e51, May 17, 2018  Return to Care: Return in about 1 year (around 12/23/2021).   Lenice Llamas, MD [facility]

## 2020-12-23 NOTE — Progress Notes (Signed)
PFT done today. 

## 2020-12-24 ENCOUNTER — Other Ambulatory Visit: Payer: Self-pay

## 2020-12-24 ENCOUNTER — Encounter (HOSPITAL_COMMUNITY): Payer: Self-pay

## 2020-12-24 ENCOUNTER — Emergency Department (HOSPITAL_COMMUNITY)
Admission: EM | Admit: 2020-12-24 | Discharge: 2020-12-24 | Disposition: A | Discharge status: Home / Self Care | Payer: 59 | Attending: Emergency Medicine | Admitting: Emergency Medicine

## 2020-12-24 ENCOUNTER — Emergency Department (HOSPITAL_COMMUNITY): Payer: 59

## 2020-12-24 DIAGNOSIS — R569 Unspecified convulsions: Secondary | ICD-10-CM | POA: Diagnosis not present

## 2020-12-24 DIAGNOSIS — S0083XA Contusion of other part of head, initial encounter: Secondary | ICD-10-CM

## 2020-12-24 DIAGNOSIS — S0181XA Laceration without foreign body of other part of head, initial encounter: Secondary | ICD-10-CM

## 2020-12-24 DIAGNOSIS — I2510 Atherosclerotic heart disease of native coronary artery without angina pectoris: Secondary | ICD-10-CM | POA: Diagnosis not present

## 2020-12-24 DIAGNOSIS — W228XXA Striking against or struck by other objects, initial encounter: Secondary | ICD-10-CM | POA: Diagnosis not present

## 2020-12-24 DIAGNOSIS — Z7982 Long term (current) use of aspirin: Secondary | ICD-10-CM | POA: Insufficient documentation

## 2020-12-24 DIAGNOSIS — I10 Essential (primary) hypertension: Secondary | ICD-10-CM | POA: Diagnosis not present

## 2020-12-24 DIAGNOSIS — S0990XA Unspecified injury of head, initial encounter: Secondary | ICD-10-CM | POA: Diagnosis present

## 2020-12-24 DIAGNOSIS — Z79899 Other long term (current) drug therapy: Secondary | ICD-10-CM | POA: Insufficient documentation

## 2020-12-24 LAB — COMPREHENSIVE METABOLIC PANEL
ALT: 29 U/L (ref 0–44)
AST: 29 U/L (ref 15–41)
Albumin: 4.6 g/dL (ref 3.5–5.0)
Alkaline Phosphatase: 105 U/L (ref 38–126)
Anion gap: 7 (ref 5–15)
BUN: 13 mg/dL (ref 6–20)
CO2: 28 mmol/L (ref 22–32)
Calcium: 9.1 mg/dL (ref 8.9–10.3)
Chloride: 104 mmol/L (ref 98–111)
Creatinine, Ser: 0.92 mg/dL (ref 0.61–1.24)
GFR, Estimated: >60 mL/min (ref >60)
Glucose, Bld: 97 mg/dL (ref 70–99)
Potassium: 3.5 mmol/L (ref 3.5–5.1)
Sodium: 139 mmol/L (ref 135–145)
Total Bilirubin: 0.7 mg/dL (ref 0.3–1.2)
Total Protein: 7.3 g/dL (ref 6.5–8.1)

## 2020-12-24 LAB — RAPID URINE DRUG SCREEN, HOSP PERFORMED
Amphetamines: NOT DETECTED
Barbiturates: NOT DETECTED
Benzodiazepines: NOT DETECTED
Cocaine: NOT DETECTED
Opiates: NOT DETECTED
Tetrahydrocannabinol: NOT DETECTED

## 2020-12-24 LAB — URINALYSIS, ROUTINE W REFLEX MICROSCOPIC
Bilirubin Urine: NEGATIVE
Glucose, UA: NEGATIVE mg/dL
Hgb urine dipstick: NEGATIVE
Ketones, ur: NEGATIVE mg/dL
Leukocytes,Ua: NEGATIVE
Nitrite: NEGATIVE
Protein, ur: NEGATIVE mg/dL
Specific Gravity, Urine: 1.015 (ref 1.005–1.030)
pH: 7.5 (ref 5.0–8.0)

## 2020-12-24 LAB — CBC WITH DIFFERENTIAL/PLATELET
Abs Immature Granulocytes: 0.02 10*3/uL (ref 0.00–0.07)
Basophils Absolute: 0.1 10*3/uL (ref 0.0–0.1)
Basophils Relative: 1 %
Eosinophils Absolute: 0.2 10*3/uL (ref 0.0–0.5)
Eosinophils Relative: 3 %
HCT: 42.4 % (ref 39.0–52.0)
Hemoglobin: 14 g/dL (ref 13.0–17.0)
Immature Granulocytes: 0 %
Lymphocytes Relative: 34 %
Lymphs Abs: 2.1 10*3/uL (ref 0.7–4.0)
MCH: 29.2 pg (ref 26.0–34.0)
MCHC: 33 g/dL (ref 30.0–36.0)
MCV: 88.5 fL (ref 80.0–100.0)
Monocytes Absolute: 0.3 10*3/uL (ref 0.1–1.0)
Monocytes Relative: 6 %
Neutro Abs: 3.4 10*3/uL (ref 1.7–7.7)
Neutrophils Relative %: 56 %
Platelets: 345 10*3/uL (ref 150–400)
RBC: 4.79 MIL/uL (ref 4.22–5.81)
RDW: 13.2 % (ref 11.5–15.5)
WBC: 6 10*3/uL (ref 4.0–10.5)
nRBC: 0 % (ref 0.0–0.2)

## 2020-12-24 LAB — MAGNESIUM: Magnesium: 2.1 mg/dL (ref 1.7–2.4)

## 2020-12-24 MED ORDER — MORPHINE SULFATE (PF) 4 MG/ML IV SOLN
4.0000 mg | Freq: Once | INTRAVENOUS | Status: AC
  Administered 2020-12-24: 4 mg via INTRAVENOUS
  Filled 2020-12-24: qty 1

## 2020-12-24 MED ORDER — SODIUM CHLORIDE 0.9 % IV SOLN: INTRAVENOUS | Status: DC

## 2020-12-24 MED ORDER — SODIUM CHLORIDE 0.9 % IV BOLUS
1000.0000 mL | Freq: Once | INTRAVENOUS | Status: AC
  Administered 2020-12-24: 1000 mL via INTRAVENOUS

## 2020-12-24 MED ORDER — LIDOCAINE-EPINEPHRINE (PF) 2 %-1:200000 IJ SOLN
10.0000 mL | Freq: Once | INTRAMUSCULAR | Status: AC
  Administered 2020-12-24: 10 mL
  Filled 2020-12-24: qty 20

## 2020-12-24 MED ORDER — LIDOCAINE-EPINEPHRINE-TETRACAINE (LET) TOPICAL GEL
3.0000 mL | Freq: Once | TOPICAL | Status: AC
  Administered 2020-12-24: 3 mL via TOPICAL
  Filled 2020-12-24: qty 3

## 2020-12-24 MED ORDER — ONDANSETRON HCL 4 MG/2ML IJ SOLN
4.0000 mg | Freq: Once | INTRAMUSCULAR | Status: AC
  Administered 2020-12-24: 4 mg via INTRAVENOUS
  Filled 2020-12-24: qty 2

## 2020-12-24 NOTE — ED Provider Notes (Signed)
   Patient initially evaluated by Dr. Gilford Raid.  I was asked to perform suture repair of laceration to the right cheek.  This was my only involvement in this patient's care.    Marland Kitchen.Laceration Repair  Date/Time: 12/24/2020 10:33 PM Performed by: Kem Parkinson, PA-C Authorized by: Kem Parkinson, PA-C   Consent:    Consent obtained:  Verbal   Consent given by:  Patient   Risks discussed:  Poor cosmetic result and infection Universal protocol:    Site/side marked: yes     Immediately prior to procedure, a time out was called: yes     Patient identity confirmed:  Verbally with patient Anesthesia:    Anesthesia method:  Local infiltration and topical application   Topical anesthetic:  LET   Local anesthetic:  Lidocaine 2% WITH epi Laceration details:    Location:  Face   Face location:  R cheek   Length (cm):  2 Pre-procedure details:    Preparation:  Patient was prepped and draped in usual sterile fashion Exploration:    Hemostasis achieved with:  LET   Imaging outcome: foreign body not noted     Wound exploration: entire depth of wound visualized     Wound extent: no foreign bodies/material noted and no muscle damage noted     Contaminated: no   Treatment:    Area cleansed with:  Povidone-iodine   Amount of cleaning:  Standard   Irrigation solution:  Sterile saline   Irrigation method:  Pressure wash   Debridement:  None   Undermining:  None Skin repair:    Repair method:  Sutures   Suture size:  5-0   Suture material:  Prolene   Suture technique:  Simple interrupted   Number of sutures:  4 Approximation:    Approximation:  Close Repair type:    Repair type:  Simple Post-procedure details:    Dressing:  Antibiotic ointment   Procedure completion:  Tolerated well, no immediate complications   Hemostasis obtained prior to closure, wound edges well approximated.  Wound care instructions discussed.    Kem Parkinson, PA-C 12/24/20 2238    Isla Pence,  MD 12/24/20 2318

## 2020-12-24 NOTE — ED Provider Notes (Signed)
[facility] EMERGENCY DEPARTMENT Provider Note   CSN: [id number] Arrival date & time: 12/24/20  1802     History Chief Complaint  Patient presents with   Facial Laceration   Seizures    Mathew Moore is a 55 y.o. male.  Pt presents to the ED today with a seizure.  He said he was in the garage and had a seizure and hit the right side of his face.  The pt has a hx of psychogenic seizures.  He has seen neurology and is under the care of a psychiatrist.  Pt did sustain some bruising and has a lac to his right cheek.      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    Patient Active Problem List   Diagnosis Date Noted   Sinusitis, chronic 06/11/2020   Deviated septum 06/11/2020   Nasal turbinate hypertrophy 06/11/2020   Essential hypertension 05/31/2020   Observed seizure-like activity ([place]) 05/30/2020   Syncope 05/14/2020   Hypokalemia 05/14/2020   Anxiety    Seizure-like activity ([place])    Sarcoidosis 08/10/2019   CAD (coronary artery disease) 07/2019   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   Lymphadenopathy, mediastinal 06/11/2019   Otorrhea, right 12/07/2018   Abnormal stress test    Chest pain    Encounter for screening colonoscopy 01/18/2018   Acute suppurative otitis media of left ear without spontaneous rupture of tympanic membrane 09/07/2017   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/22/2016   Eustachian tube dysfunction, bilateral 07/29/2015   Myringotomy tube status 07/29/2015   Dyspnea 04/02/2013    Past Surgical History:  Procedure Laterality Date   BRONCHOSCOPY     CLEFT LIP REPAIR     COLONOSCOPY     HERNIA REPAIR Left    LEFT HEART CATH AND CORONARY ANGIOGRAPHY N/A 08/23/2018    Procedure: LEFT HEART CATH AND CORONARY ANGIOGRAPHY;  Surgeon: Burnell Blanks, MD;  Location: [place] [facility];  Service: Cardiovascular;  Laterality: N/A;   MEDIASTINOSCOPY N/A 07/20/2019   Procedure: MEDIASTINOSCOPY;  Surgeon: Melrose Nakayama, MD;  Location: [place];  Service: Thoracic;  Laterality: N/A;   NASAL SEPTOPLASTY W/ TURBINOPLASTY Bilateral 12/03/2020   Procedure: REVISION OF NASAL SEPTOPLASTY AND INFERIOR TURBINATE REDUCTION;  Surgeon: Jerrell Belfast, MD;  Location: [place];  Service: ENT;  Laterality: Bilateral;   NASAL SEPTUM SURGERY     PLANTAR FASCIA SURGERY Right    SINUS ENDO W/FUSION Bilateral 12/03/2020   Procedure: BILATERAL ENDOSCOPIC SINUS SURGERY WITH NAVIGATION;  Surgeon: Jerrell Belfast, MD;  Location: [place];  Service: ENT;  Laterality: Bilateral;   VIDEO BRONCHOSCOPY WITH ENDOBRONCHIAL ULTRASOUND N/A 07/06/2019   Procedure: VIDEO BRONCHOSCOPY WITH ENDOBRONCHIAL ULTRASOUND;  Surgeon: Marshell Garfinkel, MD;  Location: [place];  Service: Pulmonary;  Laterality: N/A;       Family History  Problem Relation Age of Onset   Breast cancer Mother    Heart disease Father    Lung cancer Father    Heart attack Father     Social History   Tobacco Use   Smoking status: Never   Smokeless tobacco: Never  Vaping Use   Vaping Use: Never used  Substance Use Topics  Alcohol use: Not Currently   Drug use: Not Currently    Home Medications Prior to Admission medications   Medication Sig Start Date End Date Taking? Authorizing Provider  acetaminophen (TYLENOL) 500 MG tablet Take 1,000 mg by mouth every 6 (six) hours as needed for moderate pain or headache.    Yes [provider]  albuterol (VENTOLIN HFA) 108 (90 Base) MCG/ACT inhaler INHALE 1 TO 2 PUFFS BY MOUTH EVERY 6 HOURS AS NEEDED FOR WHEEZING OR SHORTNESS OF BREATH Patient taking differently: Inhale 1-2 puffs into the lungs every 6 (six) hours as needed for shortness of breath. 11/10/20  Yes Martyn Ehrich, NP  aspirin EC 81 MG tablet Take 81 mg by mouth in the morning. Swallow whole.   Yes [provider]  atorvastatin (LIPITOR) 40 MG tablet Take 40 mg by mouth in the morning.   Yes [provider]  baclofen (LIORESAL) 10 MG tablet Take 10-20 mg by mouth every 6 (six) hours as needed (headaches.).   Yes [provider]  Cholecalciferol (VITAMIN D) 50 MCG (2000 UT) CAPS Take 2,000 Units by mouth in the morning.   Yes [provider]  clonazePAM (KLONOPIN) 0.5 MG tablet Take 0.5-1 mg by mouth See admin instructions. Take 2 tablets (1 mg) by mouth with lunch & may take an additional dose in the evening if needed for anxiety.   Yes [provider]  FLUoxetine (PROZAC) 40 MG capsule Take 40 mg by mouth daily. 11/12/20  Yes [provider]  fluticasone (FLONASE) 50 MCG/ACT nasal spray Place 2 sprays into both nostrils daily as needed for allergies. 08/14/20  Yes [provider]  lamoTRIgine (LAMICTAL) 150 MG tablet Take 150 mg by mouth at bedtime. 11/12/20  Yes [provider]  losartan (COZAAR) 25 MG tablet Take 25 mg by mouth in the morning. 05/16/20  Yes [provider]  testosterone cypionate (DEPOTESTOSTERONE CYPIONATE) 200 MG/ML injection Inject 200 mg into the muscle every 21 ( twenty-one) days. 05/01/20  Yes [provider]  zolpidem (AMBIEN CR) 6.25 MG CR tablet Take 6.25 mg by mouth at bedtime. 11/19/20  Yes [provider]  diclofenac (VOLTAREN) 75 MG EC tablet Take 75 mg by mouth 2 (two) times daily as needed (plantar fasciitis). Patient not taking: Reported on 12/24/2020 07/29/20   [provider]  lamoTRIgine (LAMICTAL) 25 MG tablet Take 2 tablets (50 mg total) by mouth 2 (two) times daily. Patient not taking: Reported on 12/23/2020 06/10/20   Penumalli, Earlean Polka, MD  omeprazole (PRILOSEC) 40 MG capsule TAKE 1 CAPSULE BY MOUTH ONCE DAILY IN THE MORNING AND AT BEDTIME Patient  taking differently: Take 40 mg by mouth 2 (two) times daily. 12/16/20   Spero Geralds, MD    Allergies    Patient has no known allergies.  Review of Systems   Review of Systems  HENT:  Positive for facial swelling.   Skin:  Positive for wound.  All other systems reviewed and are negative.  Physical Exam Updated Vital Signs BP 137/83   Pulse 66   Temp 97.9 F (36.6 C) (Oral)   Resp 18   SpO2 100%   Physical Exam Vitals and nursing note reviewed.  Constitutional:      Appearance: Normal appearance.  HENT:     Head:      Right Ear: External ear normal.     Left Ear: External ear normal.     Nose: Nose normal.  Mouth/Throat:     Mouth: Mucous membranes are moist.     Pharynx: Oropharynx is clear.  Eyes:     Extraocular Movements: Extraocular movements intact.     Conjunctiva/sclera: Conjunctivae normal.     Pupils: Pupils are equal, round, and reactive to light.  Cardiovascular:     Rate and Rhythm: Normal rate and regular rhythm.     Pulses: Normal pulses.     Heart sounds: Normal heart sounds.  Pulmonary:     Effort: Pulmonary effort is normal.     Breath sounds: Normal breath sounds.  Abdominal:     General: Abdomen is flat. Bowel sounds are normal.     Palpations: Abdomen is soft.  Musculoskeletal:        General: Normal range of motion.     Cervical back: Normal range of motion and neck supple.  Skin:    General: Skin is warm.     Capillary Refill: Capillary refill takes less than 2 seconds.  Neurological:     General: No focal deficit present.     Mental Status: He is alert and oriented to person, place, and time.  Psychiatric:        Mood and Affect: Mood normal.        Behavior: Behavior normal.    ED Results / Procedures / Treatments   Labs (all labs ordered are listed, but only abnormal results are displayed) Labs Reviewed  COMPREHENSIVE METABOLIC PANEL  CBC WITH DIFFERENTIAL/PLATELET  MAGNESIUM  URINALYSIS, ROUTINE W REFLEX MICROSCOPIC   RAPID URINE DRUG SCREEN, HOSP PERFORMED  CBG MONITORING, ED    EKG None  Radiology CT HEAD WO CONTRAST  Result Date: 12/24/2020 CLINICAL DATA:  Facial trauma.  Neck pain.  Non epileptic seizures. EXAM: CT HEAD WITHOUT CONTRAST CT MAXILLOFACIAL WITHOUT CONTRAST CT CERVICAL SPINE WITHOUT CONTRAST TECHNIQUE: Multidetector CT imaging of the head, cervical spine, and maxillofacial structures were performed using the standard protocol without intravenous contrast. Multiplanar CT image reconstructions of the cervical spine and maxillofacial structures were also generated. COMPARISON:  None. FINDINGS: CT HEAD FINDINGS Brain: No evidence of large-territorial acute infarction. No parenchymal hemorrhage. No mass lesion. No extra-axial collection. No mass effect or midline shift. No hydrocephalus. Basilar cisterns are patent. Vascular: No hyperdense vessel. Skull: No acute fracture or focal lesion. Other: None. CT MAXILLOFACIAL FINDINGS Osseous: No fracture or mandibular dislocation. No destructive process. Sinuses/Orbits: Mucosal thickening of the right maxillary sinus. Mucosal thickening of left maxillary sinus. Otherwise visualized paranasal sinuses and mastoid air cells are clear. The orbits are unremarkable. Soft tissues: Negative. CT CERVICAL SPINE FINDINGS Alignment: Normal. Skull base and vertebrae: Multilevel mild degenerative changes of the spine. No acute fracture. No aggressive appearing focal osseous lesion or focal pathologic process. Soft tissues and spinal canal: No prevertebral fluid or swelling. No visible canal hematoma. Upper chest: Unremarkable. Other: Nonspecific nuchal ligament calcification. IMPRESSION: 1. No acute intracranial abnormality. 2. No acute displaced facial fracture. 3. No acute displaced fracture or traumatic listhesis of the cervical spine. Electronically Signed   By: Iven Finn M.D.   On: 12/24/2020 20:25   CT Cervical Spine Wo Contrast  Result Date:  12/24/2020 CLINICAL DATA:  Facial trauma.  Neck pain.  Non epileptic seizures. EXAM: CT HEAD WITHOUT CONTRAST CT MAXILLOFACIAL WITHOUT CONTRAST CT CERVICAL SPINE WITHOUT CONTRAST TECHNIQUE: Multidetector CT imaging of the head, cervical spine, and maxillofacial structures were performed using the standard protocol without intravenous contrast. Multiplanar CT image reconstructions of the cervical spine and maxillofacial  structures were also generated. COMPARISON:  None. FINDINGS: CT HEAD FINDINGS Brain: No evidence of large-territorial acute infarction. No parenchymal hemorrhage. No mass lesion. No extra-axial collection. No mass effect or midline shift. No hydrocephalus. Basilar cisterns are patent. Vascular: No hyperdense vessel. Skull: No acute fracture or focal lesion. Other: None. CT MAXILLOFACIAL FINDINGS Osseous: No fracture or mandibular dislocation. No destructive process. Sinuses/Orbits: Mucosal thickening of the right maxillary sinus. Mucosal thickening of left maxillary sinus. Otherwise visualized paranasal sinuses and mastoid air cells are clear. The orbits are unremarkable. Soft tissues: Negative. CT CERVICAL SPINE FINDINGS Alignment: Normal. Skull base and vertebrae: Multilevel mild degenerative changes of the spine. No acute fracture. No aggressive appearing focal osseous lesion or focal pathologic process. Soft tissues and spinal canal: No prevertebral fluid or swelling. No visible canal hematoma. Upper chest: Unremarkable. Other: Nonspecific nuchal ligament calcification. IMPRESSION: 1. No acute intracranial abnormality. 2. No acute displaced facial fracture. 3. No acute displaced fracture or traumatic listhesis of the cervical spine. Electronically Signed   By: Iven Finn M.D.   On: 12/24/2020 20:25   CT Maxillofacial Wo Contrast  Result Date: 12/24/2020 CLINICAL DATA:  Facial trauma.  Neck pain.  Non epileptic seizures. EXAM: CT HEAD WITHOUT CONTRAST CT MAXILLOFACIAL WITHOUT CONTRAST CT  CERVICAL SPINE WITHOUT CONTRAST TECHNIQUE: Multidetector CT imaging of the head, cervical spine, and maxillofacial structures were performed using the standard protocol without intravenous contrast. Multiplanar CT image reconstructions of the cervical spine and maxillofacial structures were also generated. COMPARISON:  None. FINDINGS: CT HEAD FINDINGS Brain: No evidence of large-territorial acute infarction. No parenchymal hemorrhage. No mass lesion. No extra-axial collection. No mass effect or midline shift. No hydrocephalus. Basilar cisterns are patent. Vascular: No hyperdense vessel. Skull: No acute fracture or focal lesion. Other: None. CT MAXILLOFACIAL FINDINGS Osseous: No fracture or mandibular dislocation. No destructive process. Sinuses/Orbits: Mucosal thickening of the right maxillary sinus. Mucosal thickening of left maxillary sinus. Otherwise visualized paranasal sinuses and mastoid air cells are clear. The orbits are unremarkable. Soft tissues: Negative. CT CERVICAL SPINE FINDINGS Alignment: Normal. Skull base and vertebrae: Multilevel mild degenerative changes of the spine. No acute fracture. No aggressive appearing focal osseous lesion or focal pathologic process. Soft tissues and spinal canal: No prevertebral fluid or swelling. No visible canal hematoma. Upper chest: Unremarkable. Other: Nonspecific nuchal ligament calcification. IMPRESSION: 1. No acute intracranial abnormality. 2. No acute displaced facial fracture. 3. No acute displaced fracture or traumatic listhesis of the cervical spine. Electronically Signed   By: Iven Finn M.D.   On: 12/24/2020 20:25    Procedures Procedures   Medications Ordered in ED Medications  sodium chloride 0.9 % bolus 1,000 mL (1,000 mLs Intravenous New Bag/Given 12/24/20 2108)    And  0.9 %  sodium chloride infusion (has no administration in time range)  lidocaine-EPINEPHrine-tetracaine (LET) topical gel (has no administration in time range)  morphine  4 MG/ML injection 4 mg (has no administration in time range)  lidocaine-EPINEPHrine (XYLOCAINE W/EPI) 2 %-1:200000 (PF) injection 10 mL (10 mLs Infiltration Given 12/24/20 2124)  morphine 4 MG/ML injection 4 mg (4 mg Intravenous Given 12/24/20 2104)  ondansetron (ZOFRAN) injection 4 mg (4 mg Intravenous Given 12/24/20 2104)    ED Course  I have reviewed the triage vital signs and the nursing notes.  Pertinent labs & imaging results that were available during my care of the patient were reviewed by me and considered in my medical decision making (see chart for details).    MDM Rules/Calculators/A&P  Pt's work up is unremarkable.  CT scans are nl.  Pt needs to f/u with neuro/psych.  He was sutured by PA Triplett.  Pt is to get stitches removed in 5 days.  He is to return if worse.  Final Clinical Impression(s) / ED Diagnoses Final diagnoses:  Seizure-like activity ([place])  Facial laceration, initial encounter  Contusion of face, initial encounter    Rx / DC Orders ED Discharge Orders     None        Isla Pence, MD 12/24/20 2245

## 2020-12-24 NOTE — ED Triage Notes (Signed)
Pt was in his garage when he started having seizure like activity. Pt hit is head on counter, has a laceration on right cheek. EMS reports no post-ictal state  Pt is alert and oriented on arrival

## 2020-12-24 NOTE — ED Notes (Signed)
Wound irrigated with 1000 ccs

## 2021-01-01 ENCOUNTER — Emergency Department (HOSPITAL_COMMUNITY): Payer: 59

## 2021-01-01 ENCOUNTER — Emergency Department (HOSPITAL_COMMUNITY)
Admission: EM | Admit: 2021-01-01 | Discharge: 2021-01-02 | Disposition: A | Discharge status: Home / Self Care | Payer: 59 | Attending: Emergency Medicine | Admitting: Emergency Medicine

## 2021-01-01 ENCOUNTER — Other Ambulatory Visit: Payer: Self-pay

## 2021-01-01 ENCOUNTER — Encounter (HOSPITAL_COMMUNITY): Payer: Self-pay | Admitting: *Deleted

## 2021-01-01 DIAGNOSIS — R569 Unspecified convulsions: Secondary | ICD-10-CM | POA: Insufficient documentation

## 2021-01-01 DIAGNOSIS — Z7982 Long term (current) use of aspirin: Secondary | ICD-10-CM | POA: Diagnosis not present

## 2021-01-01 DIAGNOSIS — I2510 Atherosclerotic heart disease of native coronary artery without angina pectoris: Secondary | ICD-10-CM | POA: Diagnosis not present

## 2021-01-01 DIAGNOSIS — Z955 Presence of coronary angioplasty implant and graft: Secondary | ICD-10-CM | POA: Insufficient documentation

## 2021-01-01 DIAGNOSIS — Z79899 Other long term (current) drug therapy: Secondary | ICD-10-CM | POA: Diagnosis not present

## 2021-01-01 DIAGNOSIS — W01198A Fall on same level from slipping, tripping and stumbling with subsequent striking against other object, initial encounter: Secondary | ICD-10-CM | POA: Insufficient documentation

## 2021-01-01 DIAGNOSIS — Y9301 Activity, walking, marching and hiking: Secondary | ICD-10-CM | POA: Insufficient documentation

## 2021-01-01 DIAGNOSIS — F445 Conversion disorder with seizures or convulsions: Secondary | ICD-10-CM

## 2021-01-01 DIAGNOSIS — I10 Essential (primary) hypertension: Secondary | ICD-10-CM | POA: Insufficient documentation

## 2021-01-01 DIAGNOSIS — S0181XA Laceration without foreign body of other part of head, initial encounter: Secondary | ICD-10-CM | POA: Insufficient documentation

## 2021-01-01 HISTORY — DX: Conversion disorder with seizures or convulsions: F44.5

## 2021-01-01 LAB — CBC WITH DIFFERENTIAL/PLATELET
Abs Immature Granulocytes: 0.01 10*3/uL (ref 0.00–0.07)
Basophils Absolute: 0 10*3/uL (ref 0.0–0.1)
Basophils Relative: 1 %
Eosinophils Absolute: 0.2 10*3/uL (ref 0.0–0.5)
Eosinophils Relative: 3 %
HCT: 40 % (ref 39.0–52.0)
Hemoglobin: 13.5 g/dL (ref 13.0–17.0)
Immature Granulocytes: 0 %
Lymphocytes Relative: 35 %
Lymphs Abs: 2 10*3/uL (ref 0.7–4.0)
MCH: 30.5 pg (ref 26.0–34.0)
MCHC: 33.8 g/dL (ref 30.0–36.0)
MCV: 90.5 fL (ref 80.0–100.0)
Monocytes Absolute: 0.5 10*3/uL (ref 0.1–1.0)
Monocytes Relative: 9 %
Neutro Abs: 3 10*3/uL (ref 1.7–7.7)
Neutrophils Relative %: 52 %
Platelets: 298 10*3/uL (ref 150–400)
RBC: 4.42 MIL/uL (ref 4.22–5.81)
RDW: 13.3 % (ref 11.5–15.5)
WBC: 5.7 10*3/uL (ref 4.0–10.5)
nRBC: 0 % (ref 0.0–0.2)

## 2021-01-01 LAB — COMPREHENSIVE METABOLIC PANEL
ALT: 27 U/L (ref 0–44)
AST: 28 U/L (ref 15–41)
Albumin: 4 g/dL (ref 3.5–5.0)
Alkaline Phosphatase: 89 U/L (ref 38–126)
Anion gap: 7 (ref 5–15)
BUN: 16 mg/dL (ref 6–20)
CO2: 27 mmol/L (ref 22–32)
Calcium: 8.9 mg/dL (ref 8.9–10.3)
Chloride: 103 mmol/L (ref 98–111)
Creatinine, Ser: 0.96 mg/dL (ref 0.61–1.24)
GFR, Estimated: >60 mL/min (ref >60)
Glucose, Bld: 108 mg/dL — ABNORMAL HIGH (ref 70–99)
Potassium: 3.7 mmol/L (ref 3.5–5.1)
Sodium: 137 mmol/L (ref 135–145)
Total Bilirubin: 0.4 mg/dL (ref 0.3–1.2)
Total Protein: 6.5 g/dL (ref 6.5–8.1)

## 2021-01-01 LAB — CBG MONITORING, ED: Glucose-Capillary: 106 mg/dL — ABNORMAL HIGH (ref 70–99)

## 2021-01-01 MED ORDER — ACETAMINOPHEN 325 MG PO TABS
650.0000 mg | ORAL_TABLET | Freq: Once | ORAL | Status: AC
  Administered 2021-01-01: 650 mg via ORAL
  Filled 2021-01-01: qty 2

## 2021-01-01 NOTE — ED Triage Notes (Addendum)
Pt and EMS reports pt with multiple seizures over the night and today. Had a seizure on way to bathroom earlier and hitting his head on the bedroom door and dog gate.  Pt state he lost LOC for few minutes.  Superficial lac to forehead.  Pt states he has been taking his seizure medication as prescribed. Cbg 133. 2 mg versed and 4mg  zofran given en route.  16G Lt AC

## 2021-01-01 NOTE — Discharge Instructions (Addendum)
Follow up with your neurologist   Please return to the emergency department for any new or worsening symptoms.

## 2021-01-01 NOTE — ED Notes (Signed)
Pt c/o headache, Cout[place], PA aware-new verbal order received

## 2021-01-01 NOTE — ED Notes (Signed)
Pt in CT at this time.

## 2021-01-01 NOTE — ED Provider Notes (Addendum)
[facility] EMERGENCY DEPARTMENT Provider Note   CSN: [id number] Arrival date & time: 01/01/21  2049     History Chief Complaint  Patient presents with   Seizures    Mathew Moore is a 55 y.o. male.  HPI  55 year old male with a history of anxiety, atypical mole, GERD, hypertension, sarcoidosis, seizure-like activity, nonepileptic seizures, who presents to the emergency department today for evaluation of seizure-like activity at home.  Wife is at bedside and she states that the patient has had shaking episodes since 5 PM today.  He fell and hit his head earlier today.  She states that he sustained significant head trauma earlier in the year and since then he has had symptoms like this.  He has been seen by multiple neurologists and has had EEGs which show nonepileptiform seizure-like activity.  He is not currently on any antiepileptics at this time per her report.  Patient denies any complaints at the time of my evaluation except for a headache.  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   Pschogenic nonepileptic, last one 11/28/20   Psychogenic nonepileptic seizure    Sarcoidosis 07/2019    Patient Active Problem List   Diagnosis Date Noted   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   Sinusitis, chronic 06/11/2020   Deviated septum 06/11/2020   Nasal turbinate hypertrophy 06/11/2020   Essential hypertension 05/31/2020   Observed seizure-like activity ([place]) 05/30/2020   Syncope 05/14/2020   Hypokalemia 05/14/2020   Anxiety    Seizure-like activity ([place])    Sarcoidosis 08/10/2019   CAD (coronary artery disease) 07/2019   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   Lymphadenopathy, mediastinal 06/11/2019   Otorrhea, right 12/07/2018   Abnormal stress test    Chest pain    Encounter for screening  colonoscopy 01/18/2018   Acute suppurative otitis media of left ear without spontaneous rupture of tympanic membrane 09/07/2017   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/22/2016   Eustachian tube dysfunction, bilateral 07/29/2015   Myringotomy tube status 07/29/2015   Dyspnea 04/02/2013    Past Surgical History:  Procedure Laterality Date   BRONCHOSCOPY     CLEFT LIP REPAIR     COLONOSCOPY     HERNIA REPAIR Left    LEFT HEART CATH AND CORONARY ANGIOGRAPHY N/A 08/23/2018   Procedure: LEFT HEART CATH AND CORONARY ANGIOGRAPHY;  Surgeon: Burnell Blanks, MD;  Location: [place] [facility];  Service: Cardiovascular;  Laterality: N/A;   MEDIASTINOSCOPY N/A 07/20/2019   Procedure: MEDIASTINOSCOPY;  Surgeon: Melrose Nakayama, MD;  Location: [place];  Service: Thoracic;  Laterality: N/A;   NASAL SEPTOPLASTY W/ TURBINOPLASTY Bilateral 12/03/2020   Procedure: REVISION OF NASAL SEPTOPLASTY AND INFERIOR TURBINATE REDUCTION;  Surgeon: Jerrell Belfast, MD;  Location: [place];  Service: ENT;  Laterality: Bilateral;   NASAL SEPTUM SURGERY     PLANTAR FASCIA SURGERY Right    SINUS ENDO W/FUSION Bilateral 12/03/2020   Procedure: BILATERAL ENDOSCOPIC SINUS SURGERY WITH NAVIGATION;  Surgeon: Jerrell Belfast, MD;  Location: [place];  Service: ENT;  Laterality: Bilateral;   VIDEO BRONCHOSCOPY WITH ENDOBRONCHIAL ULTRASOUND N/A 07/06/2019   Procedure: VIDEO BRONCHOSCOPY WITH ENDOBRONCHIAL ULTRASOUND;  Surgeon: Marshell Garfinkel, MD;  Location: [employer] [facility];  Service: Pulmonary;  Laterality: N/A;  Family History  Problem Relation Age of Onset   Breast cancer Mother    Heart disease Father    Lung cancer Father    Heart attack Father     Social History   Tobacco Use   Smoking status: Never   Smokeless tobacco: Never  Vaping Use   Vaping Use: Never used  Substance Use Topics   Alcohol use: Not Currently   Drug use: Not Currently    Home  Medications Prior to Admission medications   Medication Sig Start Date End Date Taking? Authorizing Provider  acetaminophen (TYLENOL) 500 MG tablet Take 1,000 mg by mouth every 6 (six) hours as needed for moderate pain or headache.     [provider]  albuterol (VENTOLIN HFA) 108 (90 Base) MCG/ACT inhaler INHALE 1 TO 2 PUFFS BY MOUTH EVERY 6 HOURS AS NEEDED FOR WHEEZING OR SHORTNESS OF BREATH Patient taking differently: Inhale 1-2 puffs into the lungs every 6 (six) hours as needed for shortness of breath. 11/10/20   Martyn Ehrich, NP  aspirin EC 81 MG tablet Take 81 mg by mouth in the morning. Swallow whole.    [provider]  atorvastatin (LIPITOR) 40 MG tablet Take 40 mg by mouth in the morning.    [provider]  baclofen (LIORESAL) 10 MG tablet Take 10-20 mg by mouth every 6 (six) hours as needed (headaches.).    [provider]  Cholecalciferol (VITAMIN D) 50 MCG (2000 UT) CAPS Take 2,000 Units by mouth in the morning.    [provider]  clonazePAM (KLONOPIN) 0.5 MG tablet Take 0.5-1 mg by mouth See admin instructions. Take 2 tablets (1 mg) by mouth with lunch & may take an additional dose in the evening if needed for anxiety.    [provider]  diclofenac (VOLTAREN) 75 MG EC tablet Take 75 mg by mouth 2 (two) times daily as needed (plantar fasciitis). Patient not taking: Reported on 12/24/2020 07/29/20   [provider]  FLUoxetine (PROZAC) 40 MG capsule Take 40 mg by mouth daily. 11/12/20   [provider]  fluticasone (FLONASE) 50 MCG/ACT nasal spray Place 2 sprays into both nostrils daily as needed for allergies. 08/14/20   [provider]  lamoTRIgine (LAMICTAL) 150 MG tablet Take 150 mg by mouth at bedtime. 11/12/20   [provider]  lamoTRIgine (LAMICTAL) 25 MG tablet Take 2 tablets (50 mg total) by mouth 2 (two) times daily. Patient not taking: Reported on 12/23/2020 06/10/20   Penumalli,  Earlean Polka, MD  losartan (COZAAR) 25 MG tablet Take 25 mg by mouth in the morning. 05/16/20   [provider]  omeprazole (PRILOSEC) 40 MG capsule TAKE 1 CAPSULE BY MOUTH ONCE DAILY IN THE MORNING AND AT BEDTIME Patient taking differently: Take 40 mg by mouth 2 (two) times daily. 12/16/20   Spero Geralds, MD  testosterone cypionate (DEPOTESTOSTERONE CYPIONATE) 200 MG/ML injection Inject 200 mg into the muscle every 21 ( twenty-one) days. 05/01/20   [provider]  zolpidem (AMBIEN CR) 6.25 MG CR tablet Take 6.25 mg by mouth at bedtime. 11/19/20   [provider]    Allergies    Patient has no known allergies.  Review of Systems   Review of Systems  Constitutional:  Negative for chills and fever.  HENT:  Negative for ear pain and sore throat.   Eyes:  Negative for pain and visual disturbance.  Respiratory:  Negative for cough and shortness of breath.  Cardiovascular:  Negative for chest pain.  Gastrointestinal:  Negative for abdominal pain and vomiting.  Genitourinary:  Negative for dysuria and hematuria.  Musculoskeletal:  Negative for back pain.  Skin:  Positive for wound.  Neurological:  Positive for headaches.       Head injury  All other systems reviewed and are negative.  Physical Exam Updated Vital Signs BP 133/79   Pulse 60   Temp 98.2 F (36.8 C) (Oral)   Resp (!) 23   Ht 5\' 8"  (1.727 m)   Wt 94.8 kg   SpO2 97%   BMI 31.78 kg/m   Physical Exam Vitals and nursing note reviewed.  Constitutional:      General: He is not in acute distress.    Appearance: He is well-developed.  HENT:     Head: Normocephalic.     Comments: 4 cm superficial laceration to the forehead. Eyes:     Conjunctiva/sclera: Conjunctivae normal.  Cardiovascular:     Rate and Rhythm: Normal rate and regular rhythm.     Heart sounds: Normal heart sounds. No murmur heard. Pulmonary:     Effort: Pulmonary effort is normal. No respiratory distress.     Breath sounds:  Normal breath sounds. No wheezing, rhonchi or rales.  Abdominal:     General: Bowel sounds are normal.     Palpations: Abdomen is soft.     Tenderness: There is no abdominal tenderness. There is no guarding or rebound.  Musculoskeletal:        General: No swelling.     Cervical back: Neck supple.  Skin:    General: Skin is warm and dry.     Capillary Refill: Capillary refill takes less than 2 seconds.  Neurological:     Mental Status: He is alert.     Comments: Mental Status:  Alert, thought content appropriate, able to give a coherent history. Speech fluent without evidence of aphasia. Able to follow 2 step commands without difficulty.  Cranial Nerves:  II:  pupils equal, round, reactive to light III,IV, VI: ptosis not present, extra-ocular motions intact bilaterally  V,VII: smile symmetric, facial light touch sensation equal VIII: hearing grossly normal to voice  X: uvula elevates symmetrically  XI: bilateral shoulder shrug symmetric and strong XII: midline tongue extension without fassiculations Motor:  Normal tone. 5/5 strength of BUE and BLE major muscle groups including strong and equal grip strength and dorsiflexion/plantar flexion Sensory: light touch normal in all extremities.   Psychiatric:        Mood and Affect: Mood normal.    ED Results / Procedures / Treatments   Labs (all labs ordered are listed, but only abnormal results are displayed) Labs Reviewed  COMPREHENSIVE METABOLIC PANEL - Abnormal; Notable for the following components:      Result Value   Glucose, Bld 108 (*)    All other components within normal limits  CBG MONITORING, ED - Abnormal; Notable for the following components:   Glucose-Capillary 106 (*)    All other components within normal limits  CBC WITH DIFFERENTIAL/PLATELET    EKG None  Radiology No results found.  Procedures .Marland KitchenLaceration Repair  Date/Time: 01/01/2021 11:32 PM Performed by: Rodney Booze, PA-C Authorized by:  Rodney Booze, PA-C   Consent:    Consent obtained:  Verbal   Consent given by:  Patient   Risks, benefits, and alternatives were discussed: yes     Risks discussed:  Infection   Alternatives discussed:  No treatment Universal protocol:  Procedure explained and questions answered to patient or proxy's satisfaction: yes     Patient identity confirmed:  Verbally with patient Anesthesia:    Anesthesia method:  None Laceration details:    Location: forehead.   Length (cm):  3 Exploration:    Limited defect created (wound extended): no     Hemostasis achieved with:  Direct pressure   Wound exploration: wound explored through full range of motion and entire depth of wound visualized     Contaminated: no   Treatment:    Area cleansed with:  Saline   Amount of cleaning:  Standard   Irrigation solution:  Sterile saline   Irrigation method:  Pressure wash   Visualized foreign bodies/material removed: no     Debridement:  None   Undermining:  None   Scar revision: no   Skin repair:    Repair method:  Tissue adhesive Approximation:    Approximation:  Close Repair type:    Repair type:  Simple Post-procedure details:    Dressing:  Open (no dressing)   Procedure completion:  Tolerated   Medications Ordered in ED Medications - No data to display  ED Course  I have reviewed the triage vital signs and the nursing notes.  Pertinent labs & imaging results that were available during my care of the patient were reviewed by me and considered in my medical decision making (see chart for details).    MDM Rules/Calculators/A&P                          55 year old male presents to the emergency department today for evaluation of seizure-like activity at home.  Has had multiple neurologic evaluations as an outpatient that did not show any epileptiform activity.  On my evaluation he is alert oriented and there are no focal deficits on my exam.  He did sustain head trauma and has a  superficial laceration to the forehead will be repaired with Dermabond.  Laboratory work is reassuring.  At shift change he is pending CT head and cervical spine and care will be signed out to Dr. Stark Jock with plan to follow-up on imaging studies.  Final Clinical Impression(s) / ED Diagnoses Final diagnoses:  Seizure-like activity [facility])    Rx / DC Orders ED Discharge Orders     None        Rodney Booze, PA-C 01/01/21 2306    Rodney Booze, PA-C 01/01/21 2333    Sherwood Gambler, MD 01/02/21 [phone]

## 2021-01-01 NOTE — ED Notes (Signed)
Pt back from CT - PA at bedside to Dermabond scratch on pt forehead

## 2021-01-01 NOTE — ED Notes (Signed)
Wife came to nurses station due to pt having pseudoseizure. This RN entered pts room and began to talk with pt. Pt continued to shake all 4 extremities. After about 1 minute pt began to respond to RN and follow commands. Pt VS remained stable during episode.

## 2021-01-01 NOTE — ED Notes (Signed)
ED Provider at bedside. 

## 2021-01-02 NOTE — ED Provider Notes (Signed)
  Physical Exam  BP 126/82   Pulse 64   Temp 98.2 F (36.8 C) (Oral)   Resp 16   Ht 5\' 8"  (1.727 m)   Wt 94.8 kg   SpO2 98%   BMI 31.78 kg/m   Physical Exam Vitals and nursing note reviewed.  Constitutional:      General: He is not in acute distress.    Appearance: Normal appearance. He is not ill-appearing.  HENT:     Head: Normocephalic and atraumatic.  Pulmonary:     Effort: Pulmonary effort is normal.  Skin:    General: Skin is warm and dry.  Neurological:     General: No focal deficit present.     Mental Status: He is alert and oriented to person, place, and time.     Cranial Nerves: No cranial nerve deficit.     Sensory: No sensory deficit.    ED Course/Procedures     Procedures  MDM  Care assumed from Dr. Regenia Skeeter and [place].  Patient awaiting results of CT scan of the head and cervical spine.  Patient with an 62-month history of new onset seizure-like activity.  He has been evaluated on multiple occasions in the ER and by [facility]  It appears as though has been diagnosed with psychogenic nonepileptic seizures.  CT scans have resulted and are unremarkable.  Patient reassessed.  He is awake, alert, and neurologically intact.  I had a discussion with the patient and wife and it sounds as though they will speak with her neurologist regarding referral to [facility] neurology.  I see no indication for admission or emergent neurology consultation at this time.       Veryl Speak, MD 01/02/21 [phone]

## 2021-01-11 ENCOUNTER — Encounter (HOSPITAL_COMMUNITY): Payer: Self-pay

## 2021-01-11 ENCOUNTER — Other Ambulatory Visit: Payer: Self-pay

## 2021-01-11 ENCOUNTER — Emergency Department (HOSPITAL_COMMUNITY)
Admission: EM | Admit: 2021-01-11 | Discharge: 2021-01-11 | Disposition: A | Discharge status: Home / Self Care | Payer: 59 | Attending: Emergency Medicine | Admitting: Emergency Medicine

## 2021-01-11 ENCOUNTER — Emergency Department (HOSPITAL_COMMUNITY): Payer: 59

## 2021-01-11 DIAGNOSIS — Z7982 Long term (current) use of aspirin: Secondary | ICD-10-CM | POA: Insufficient documentation

## 2021-01-11 DIAGNOSIS — W19XXXA Unspecified fall, initial encounter: Secondary | ICD-10-CM

## 2021-01-11 DIAGNOSIS — I2510 Atherosclerotic heart disease of native coronary artery without angina pectoris: Secondary | ICD-10-CM | POA: Diagnosis not present

## 2021-01-11 DIAGNOSIS — S93601A Unspecified sprain of right foot, initial encounter: Secondary | ICD-10-CM | POA: Diagnosis not present

## 2021-01-11 DIAGNOSIS — I10 Essential (primary) hypertension: Secondary | ICD-10-CM | POA: Insufficient documentation

## 2021-01-11 DIAGNOSIS — S99921A Unspecified injury of right foot, initial encounter: Secondary | ICD-10-CM | POA: Diagnosis present

## 2021-01-11 DIAGNOSIS — M79671 Pain in right foot: Secondary | ICD-10-CM | POA: Insufficient documentation

## 2021-01-11 DIAGNOSIS — Z79899 Other long term (current) drug therapy: Secondary | ICD-10-CM | POA: Diagnosis not present

## 2021-01-11 DIAGNOSIS — W010XXA Fall on same level from slipping, tripping and stumbling without subsequent striking against object, initial encounter: Secondary | ICD-10-CM | POA: Insufficient documentation

## 2021-01-11 DIAGNOSIS — Y92007 Garden or yard of unspecified non-institutional (private) residence as the place of occurrence of the external cause: Secondary | ICD-10-CM | POA: Diagnosis not present

## 2021-01-11 MED ORDER — OXYCODONE-ACETAMINOPHEN 5-325 MG PO TABS
1.0000 | ORAL_TABLET | Freq: Once | ORAL | Status: AC
  Administered 2021-01-11: 1 via ORAL
  Filled 2021-01-11: qty 1

## 2021-01-11 NOTE — Discharge Instructions (Signed)
You were seen in the emergency department for right foot pain after falling.  You had x-rays of your foot and ankle that did not show any obvious fractures.  You were given a walking boot and crutches.  Please use ice to the affected area.  Tylenol and ibuprofen for pain.  Follow-up orthopedics.  Return if any worsening or concerning symptoms

## 2021-01-11 NOTE — ED Provider Notes (Signed)
[facility] EMERGENCY DEPARTMENT Provider Note   CSN: [id number] Arrival date & time: 01/11/21  2140     History Chief Complaint  Patient presents with   Mathew Moore is a 55 y.o. male.  Patient has a history of nonepileptic seizures.  He is brought in by EMS after he tripped while walking in his yard tripping over something in the yard.  Complaining of severe pain to right foot and ankle.  Was given fentanyl by EMS.  No loss of consciousness.  No blood thinners except aspirin.  Denies any other injuries.  No numbness or weakness.  The history is provided by the patient and the EMS personnel.  Foot Injury Location:  Ankle and foot Time since incident:  1 hour Injury: yes   Mechanism of injury: fall   Fall:    Fall occurred:  Walking   Impact surface:  Grass Ankle location:  R ankle Foot location:  R foot Pain details:    Quality:  Throbbing   Radiates to:  Does not radiate   Severity:  Severe   Onset quality:  Sudden   Timing:  Constant   Progression:  Unchanged Chronicity:  New Relieved by:  None tried Exacerbated by: Movement and palpation. 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   Pschogenic nonepileptic, last one 11/28/20   Psychogenic nonepileptic seizure    Sarcoidosis 07/2019    Patient Active Problem List   Diagnosis Date Noted   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   Sinusitis, chronic 06/11/2020   Deviated septum 06/11/2020   Nasal turbinate hypertrophy 06/11/2020   Essential hypertension 05/31/2020   Observed seizure-like activity ([place]) 05/30/2020   Syncope 05/14/2020   Hypokalemia 05/14/2020   Anxiety    Seizure-like  activity ([place])    Sarcoidosis 08/10/2019   CAD (coronary artery disease) 07/2019   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   Lymphadenopathy, mediastinal 06/11/2019   Otorrhea, right 12/07/2018   Abnormal stress test    Chest pain    Encounter for screening colonoscopy 01/18/2018   Acute suppurative otitis media of left ear without spontaneous rupture of tympanic membrane 09/07/2017   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/22/2016   Eustachian tube dysfunction, bilateral 07/29/2015   Myringotomy tube status 07/29/2015   Dyspnea 04/02/2013    Past Surgical History:  Procedure Laterality Date   BRONCHOSCOPY     CLEFT LIP REPAIR     COLONOSCOPY     HERNIA REPAIR Left    LEFT HEART CATH AND CORONARY ANGIOGRAPHY N/A 08/23/2018   Procedure: LEFT HEART CATH AND CORONARY ANGIOGRAPHY;  Surgeon: Burnell Blanks, MD;  Location: [place] [facility];  Service: Cardiovascular;  Laterality: N/A;   MEDIASTINOSCOPY N/A 07/20/2019   Procedure: MEDIASTINOSCOPY;  Surgeon: Melrose Nakayama, MD;  Location: [place];  Service: Thoracic;  Laterality: N/A;   NASAL SEPTOPLASTY W/ TURBINOPLASTY Bilateral 12/03/2020   Procedure: REVISION OF NASAL SEPTOPLASTY AND INFERIOR TURBINATE REDUCTION;  Surgeon: Jerrell Belfast, MD;  Location: [place];  Service: ENT;  Laterality: Bilateral;   [place]  Right    SINUS ENDO W/FUSION Bilateral 12/03/2020   Procedure: BILATERAL ENDOSCOPIC SINUS SURGERY WITH NAVIGATION;  Surgeon: Jerrell Belfast, MD;  Location: [place];  Service: ENT;  Laterality: Bilateral;   VIDEO BRONCHOSCOPY WITH ENDOBRONCHIAL ULTRASOUND N/A 07/06/2019   Procedure: VIDEO BRONCHOSCOPY WITH ENDOBRONCHIAL ULTRASOUND;  Surgeon: Marshell Garfinkel, MD;  Location: [place];  Service: Pulmonary;  Laterality: N/A;       Family History  Problem Relation Age of Onset   Breast cancer Mother    Heart disease Father    Lung cancer Father    Heart  attack Father     Social History   Tobacco Use   Smoking status: Never   Smokeless tobacco: Never  Vaping Use   Vaping Use: Never used  Substance Use Topics   Alcohol use: Not Currently   Drug use: Not Currently    Home Medications Prior to Admission medications   Medication Sig Start Date End Date Taking? Authorizing Provider  acetaminophen (TYLENOL) 500 MG tablet Take 1,000 mg by mouth every 6 (six) hours as needed for moderate pain or headache.     [provider]  albuterol (VENTOLIN HFA) 108 (90 Base) MCG/ACT inhaler INHALE 1 TO 2 PUFFS BY MOUTH EVERY 6 HOURS AS NEEDED FOR WHEEZING OR SHORTNESS OF BREATH Patient taking differently: Inhale 1-2 puffs into the lungs every 6 (six) hours as needed for shortness of breath. 11/10/20   Martyn Ehrich, NP  aspirin EC 81 MG tablet Take 81 mg by mouth in the morning. Swallow whole.    [provider]  atorvastatin (LIPITOR) 40 MG tablet Take 40 mg by mouth in the morning.    [provider]  baclofen (LIORESAL) 10 MG tablet Take 10-20 mg by mouth every 6 (six) hours as needed (headaches.).    [provider]  Cholecalciferol (VITAMIN D) 50 MCG (2000 UT) CAPS Take 2,000 Units by mouth in the morning.    [provider]  clonazePAM (KLONOPIN) 0.5 MG tablet Take 0.5-1 mg by mouth See admin instructions. Take 2 tablets (1 mg) by mouth with lunch & may take an additional dose in the evening if needed for anxiety.    [provider]  diclofenac (VOLTAREN) 75 MG EC tablet Take 75 mg by mouth 2 (two) times daily as needed (plantar fasciitis). Patient not taking: Reported on 12/24/2020 07/29/20   [provider]  FLUoxetine (PROZAC) 40 MG capsule Take 40 mg by mouth daily. 11/12/20   [provider]  fluticasone (FLONASE) 50 MCG/ACT nasal spray Place 2 sprays into both nostrils daily as needed for allergies. 08/14/20   [provider]  lamoTRIgine (LAMICTAL) 150 MG  tablet Take 150 mg by mouth at bedtime. 11/12/20   [provider]  lamoTRIgine (LAMICTAL) 25 MG tablet Take 2 tablets (50 mg total) by mouth 2 (two) times daily. Patient not taking: Reported on 12/23/2020 06/10/20   Penumalli, Earlean Polka, MD  losartan (COZAAR) 25 MG tablet Take 25 mg by mouth in the morning. 05/16/20   [provider]  omeprazole (PRILOSEC) 40 MG capsule TAKE 1 CAPSULE BY MOUTH ONCE DAILY IN THE MORNING AND AT BEDTIME Patient taking differently: Take 40 mg by mouth 2 (two) times daily. 12/16/20   Spero Geralds, MD  testosterone cypionate (DEPOTESTOSTERONE CYPIONATE) 200 MG/ML injection Inject 200 mg into the muscle every 21 ( twenty-one) days. 05/01/20   [provider]  zolpidem (AMBIEN CR) 6.25 MG CR tablet Take 6.25 mg  by mouth at bedtime. 11/19/20   [provider]    Allergies    Patient has no known allergies.  Review of Systems   Review of Systems  Constitutional:  Negative for fever.  HENT:  Negative for sore throat.   Eyes:  Negative for visual disturbance.  Respiratory:  Negative for shortness of breath.   Cardiovascular:  Negative for chest pain.  Gastrointestinal:  Negative for abdominal pain.  Genitourinary:  Negative for dysuria.  Musculoskeletal:  Positive for gait problem. Negative for back pain.  Skin:  Negative for rash.  Neurological:  Negative for headaches.   Physical Exam Updated Vital Signs BP 127/89 (BP Location: Left Arm)   Pulse 84   Temp 98.2 F (36.8 C) (Oral)   Resp 16   Ht 5\' 8"  (1.727 m)   Wt 93.9 kg   SpO2 96%   BMI 31.47 kg/m   Physical Exam Vitals and nursing note reviewed.  Constitutional:      General: He is not in acute distress.    Appearance: Normal appearance. He is well-developed.  HENT:     Head: Normocephalic and atraumatic.  Eyes:     Conjunctiva/sclera: Conjunctivae normal.  Cardiovascular:     Rate and Rhythm: Normal rate and regular rhythm.     Heart sounds: No murmur  heard. Pulmonary:     Effort: Pulmonary effort is normal. No respiratory distress.     Breath sounds: Normal breath sounds.  Abdominal:     Palpations: Abdomen is soft.     Tenderness: There is no abdominal tenderness. There is no guarding or rebound.  Musculoskeletal:        General: Tenderness and signs of injury present. No swelling.     Cervical back: Neck supple.     Comments: Right lower extremity nontender hip knee and ankle.  Has diffuse tenderness over the top of his right foot.  Distal pulses motor and sensation intact.  Skin:    General: Skin is warm and dry.     Capillary Refill: Capillary refill takes less than 2 seconds.  Neurological:     General: No focal deficit present.     Mental Status: He is alert.  Psychiatric:        Mood and Affect: Mood normal.    ED Results / Procedures / Treatments   Labs (all labs ordered are listed, but only abnormal results are displayed) Labs Reviewed - No data to display  EKG None  Radiology DG Ankle Complete Right  Result Date: 01/11/2021 CLINICAL DATA:  Fall, pain EXAM: RIGHT ANKLE - COMPLETE 3+ VIEW COMPARISON:  None FINDINGS: No acute bony abnormality. Specifically, no fracture, subluxation, or dislocation. Plantar calcaneal spur. IMPRESSION: No acute bony abnormality. Electronically Signed   By: Rolm Baptise M.D.   On: 01/11/2021 22:34   DG Foot Complete Right  Result Date: 01/11/2021 CLINICAL DATA:  Fall, pain EXAM: RIGHT FOOT COMPLETE - 3+ VIEW COMPARISON:  None. FINDINGS: Plantar calcaneal spur. No acute bony abnormality. Specifically, no fracture, subluxation, or dislocation. IMPRESSION: No acute bony abnormality.  Calcaneal spur. Electronically Signed   By: Rolm Baptise M.D.   On: 01/11/2021 22:33    Procedures Procedures   Medications Ordered in ED Medications - No data to display  ED Course  I have reviewed the triage vital signs and the nursing notes.  Pertinent labs & imaging results that were available  during my care of the patient were reviewed by me and considered in my  medical decision making (see chart for details).  Clinical Course as of 01/12/21 1036  Sun Jan 11, 2021  2226 X-rays of right foot and right ankle ordered and interpreted by me as no acute fractures.  Awaiting radiology reading. [MB]  2242 Reviewed results with patient.  He is asking for something for pain. [MB]    Clinical Course User Index [MB] Hayden Rasmussen, MD   MDM Rules/Calculators/A&P                          55 year old male here for severe right foot and ankle pain after tripping and falling in his yard.  Diffusely tender over the dorsum of his right foot.  Ankle nontender.  X-rays ordered and interpreted by me of right foot and ankle did not show any acute fracture or dislocation.  Reviewed with patient.  Placed in cam boot and crutches.  Pain medication.  Discharged to wife who is driving.  Given orthopedic follow-up information.  Return instructions discussed.  Differential includes fracture, dislocation, contusion, sprain  Final Clinical Impression(s) / ED Diagnoses Final diagnoses:  Fall, initial encounter  Sprain of right foot, initial encounter    Rx / DC Orders ED Discharge Orders     None        Hayden Rasmussen, MD 01/12/21 1037

## 2021-01-11 NOTE — ED Triage Notes (Addendum)
PER EMS: pt from home s/p trip and fall in his backyard tonight resulting in pain to right foot and ankle. Immobilized by EMS, + pedal pulses and sensation. No LOC, no head injury. 75 mcg of Fentanyl given en route. No blood thinners. A&Ox4. BP 140/80, HR-84, RR-18, O2-96% RA

## 2021-01-19 ENCOUNTER — Ambulatory Visit (INDEPENDENT_AMBULATORY_CARE_PROVIDER_SITE_OTHER)
Admission: EM | Admit: 2021-01-19 | Discharge: 2021-01-19 | Disposition: A | Discharge status: Other Acute Inpatient Hospital | Payer: 59 | Source: Home / Self Care

## 2021-01-19 ENCOUNTER — Encounter (HOSPITAL_COMMUNITY): Payer: Self-pay | Admitting: Psychiatry

## 2021-01-19 ENCOUNTER — Inpatient Hospital Stay (HOSPITAL_COMMUNITY)
Admission: AD | Admit: 2021-01-19 | Discharge: 2021-01-22 | DRG: 885 | Disposition: A | Discharge status: Home / Self Care | Payer: 59 | Source: Intra-hospital | Attending: Psychiatry | Admitting: Psychiatry

## 2021-01-19 ENCOUNTER — Other Ambulatory Visit: Payer: Self-pay

## 2021-01-19 DIAGNOSIS — Z20822 Contact with and (suspected) exposure to covid-19: Secondary | ICD-10-CM | POA: Diagnosis present

## 2021-01-19 DIAGNOSIS — I10 Essential (primary) hypertension: Secondary | ICD-10-CM | POA: Diagnosis present

## 2021-01-19 DIAGNOSIS — G4700 Insomnia, unspecified: Secondary | ICD-10-CM | POA: Diagnosis present

## 2021-01-19 DIAGNOSIS — F445 Conversion disorder with seizures or convulsions: Secondary | ICD-10-CM | POA: Diagnosis present

## 2021-01-19 DIAGNOSIS — Z801 Family history of malignant neoplasm of trachea, bronchus and lung: Secondary | ICD-10-CM

## 2021-01-19 DIAGNOSIS — D869 Sarcoidosis, unspecified: Secondary | ICD-10-CM | POA: Diagnosis present

## 2021-01-19 DIAGNOSIS — F419 Anxiety disorder, unspecified: Secondary | ICD-10-CM | POA: Diagnosis present

## 2021-01-19 DIAGNOSIS — F329 Major depressive disorder, single episode, unspecified: Secondary | ICD-10-CM | POA: Diagnosis present

## 2021-01-19 DIAGNOSIS — Z814 Family history of other substance abuse and dependence: Secondary | ICD-10-CM | POA: Insufficient documentation

## 2021-01-19 DIAGNOSIS — Z79899 Other long term (current) drug therapy: Secondary | ICD-10-CM | POA: Diagnosis not present

## 2021-01-19 DIAGNOSIS — F332 Major depressive disorder, recurrent severe without psychotic features: Secondary | ICD-10-CM | POA: Diagnosis not present

## 2021-01-19 DIAGNOSIS — R569 Unspecified convulsions: Secondary | ICD-10-CM | POA: Insufficient documentation

## 2021-01-19 DIAGNOSIS — F322 Major depressive disorder, single episode, severe without psychotic features: Principal | ICD-10-CM | POA: Diagnosis present

## 2021-01-19 DIAGNOSIS — Z818 Family history of other mental and behavioral disorders: Secondary | ICD-10-CM | POA: Insufficient documentation

## 2021-01-19 DIAGNOSIS — Z5986 Financial insecurity: Secondary | ICD-10-CM | POA: Insufficient documentation

## 2021-01-19 DIAGNOSIS — R45851 Suicidal ideations: Secondary | ICD-10-CM | POA: Diagnosis present

## 2021-01-19 DIAGNOSIS — Z8249 Family history of ischemic heart disease and other diseases of the circulatory system: Secondary | ICD-10-CM

## 2021-01-19 DIAGNOSIS — E7800 Pure hypercholesterolemia, unspecified: Secondary | ICD-10-CM | POA: Diagnosis present

## 2021-01-19 DIAGNOSIS — Z560 Unemployment, unspecified: Secondary | ICD-10-CM | POA: Insufficient documentation

## 2021-01-19 DIAGNOSIS — K219 Gastro-esophageal reflux disease without esophagitis: Secondary | ICD-10-CM | POA: Diagnosis present

## 2021-01-19 DIAGNOSIS — Z6379 Other stressful life events affecting family and household: Secondary | ICD-10-CM | POA: Insufficient documentation

## 2021-01-19 DIAGNOSIS — R450 Nervousness: Secondary | ICD-10-CM | POA: Insufficient documentation

## 2021-01-19 DIAGNOSIS — Z803 Family history of malignant neoplasm of breast: Secondary | ICD-10-CM | POA: Diagnosis not present

## 2021-01-19 LAB — CBC WITH DIFFERENTIAL/PLATELET
Abs Immature Granulocytes: 0.02 10*3/uL (ref 0.00–0.07)
Basophils Absolute: 0.1 10*3/uL (ref 0.0–0.1)
Basophils Relative: 1 %
Eosinophils Absolute: 0.1 10*3/uL (ref 0.0–0.5)
Eosinophils Relative: 2 %
HCT: 45.4 % (ref 39.0–52.0)
Hemoglobin: 15 g/dL (ref 13.0–17.0)
Immature Granulocytes: 0 %
Lymphocytes Relative: 33 %
Lymphs Abs: 2.2 10*3/uL (ref 0.7–4.0)
MCH: 28.7 pg (ref 26.0–34.0)
MCHC: 33 g/dL (ref 30.0–36.0)
MCV: 86.8 fL (ref 80.0–100.0)
Monocytes Absolute: 0.3 10*3/uL (ref 0.1–1.0)
Monocytes Relative: 4 %
Neutro Abs: 3.9 10*3/uL (ref 1.7–7.7)
Neutrophils Relative %: 60 %
Platelets: 406 10*3/uL — ABNORMAL HIGH (ref 150–400)
RBC: 5.23 MIL/uL (ref 4.22–5.81)
RDW: 13.3 % (ref 11.5–15.5)
WBC: 6.5 10*3/uL (ref 4.0–10.5)
nRBC: 0 % (ref 0.0–0.2)

## 2021-01-19 LAB — COMPREHENSIVE METABOLIC PANEL
ALT: 37 U/L (ref 0–44)
AST: 22 U/L (ref 15–41)
Albumin: 4.4 g/dL (ref 3.5–5.0)
Alkaline Phosphatase: 95 U/L (ref 38–126)
Anion gap: 8 (ref 5–15)
BUN: 11 mg/dL (ref 6–20)
CO2: 28 mmol/L (ref 22–32)
Calcium: 9.5 mg/dL (ref 8.9–10.3)
Chloride: 105 mmol/L (ref 98–111)
Creatinine, Ser: 1.12 mg/dL (ref 0.61–1.24)
GFR, Estimated: >60 mL/min (ref >60)
Glucose, Bld: 99 mg/dL (ref 70–99)
Potassium: 4.5 mmol/L (ref 3.5–5.1)
Sodium: 141 mmol/L (ref 135–145)
Total Bilirubin: 0.5 mg/dL (ref 0.3–1.2)
Total Protein: 7.3 g/dL (ref 6.5–8.1)

## 2021-01-19 LAB — POC SARS CORONAVIRUS 2 AG -  ED: SARS Coronavirus 2 Ag: NEGATIVE

## 2021-01-19 LAB — POCT URINE DRUG SCREEN - MANUAL ENTRY (I-SCREEN)
POC Amphetamine UR: NOT DETECTED
POC Buprenorphine (BUP): NOT DETECTED
POC Cocaine UR: NOT DETECTED
POC Marijuana UR: NOT DETECTED
POC Methadone UR: NOT DETECTED
POC Methamphetamine UR: NOT DETECTED
POC Morphine: NOT DETECTED
POC Oxazepam (BZO): POSITIVE — AB
POC Oxycodone UR: NOT DETECTED
POC Secobarbital (BAR): NOT DETECTED

## 2021-01-19 LAB — RESP PANEL BY RT-PCR (FLU A&B, COVID) ARPGX2
Influenza A by PCR: NEGATIVE
Influenza B by PCR: NEGATIVE
SARS Coronavirus 2 by RT PCR: NEGATIVE

## 2021-01-19 LAB — LIPID PANEL
Cholesterol: 136 mg/dL (ref 0–200)
HDL: 37 mg/dL — ABNORMAL LOW (ref >40)
LDL Cholesterol: 81 mg/dL (ref 0–99)
Total CHOL/HDL Ratio: 3.7 RATIO
Triglycerides: 89 mg/dL (ref <150)
VLDL: 18 mg/dL (ref 0–40)

## 2021-01-19 LAB — POC SARS CORONAVIRUS 2 AG: SARSCOV2ONAVIRUS 2 AG: NEGATIVE

## 2021-01-19 LAB — TSH: TSH: 2.275 u[IU]/mL (ref 0.350–4.500)

## 2021-01-19 MED ORDER — ACETAMINOPHEN 325 MG PO TABS: 650.0000 mg | ORAL_TABLET | Freq: Four times a day (QID) | ORAL | Status: DC | PRN

## 2021-01-19 MED ORDER — LAMOTRIGINE 150 MG PO TABS
150.0000 mg | ORAL_TABLET | Freq: Every day | ORAL | Status: DC
  Administered 2021-01-19 – 2021-01-21 (×3): 150 mg via ORAL
  Filled 2021-01-19 (×6): qty 1

## 2021-01-19 MED ORDER — ATORVASTATIN CALCIUM 40 MG PO TABS
40.0000 mg | ORAL_TABLET | Freq: Every day | ORAL | Status: DC
  Administered 2021-01-20 – 2021-01-22 (×3): 40 mg via ORAL
  Filled 2021-01-19 (×6): qty 1

## 2021-01-19 MED ORDER — ZOLPIDEM TARTRATE 5 MG PO TABS
5.0000 mg | ORAL_TABLET | Freq: Every evening | ORAL | Status: DC | PRN
  Administered 2021-01-19: 21:00:00 5 mg via ORAL
  Filled 2021-01-19: qty 1

## 2021-01-19 MED ORDER — ALUM & MAG HYDROXIDE-SIMETH 200-200-20 MG/5ML PO SUSP: 30.0000 mL | ORAL | Status: DC | PRN

## 2021-01-19 MED ORDER — MAGNESIUM HYDROXIDE 400 MG/5ML PO SUSP: 30.0000 mL | Freq: Every day | ORAL | Status: DC | PRN

## 2021-01-19 MED ORDER — LOSARTAN POTASSIUM 25 MG PO TABS
25.0000 mg | ORAL_TABLET | Freq: Every day | ORAL | Status: DC
  Administered 2021-01-20 – 2021-01-22 (×3): 25 mg via ORAL
  Filled 2021-01-19 (×5): qty 1

## 2021-01-19 MED ORDER — CLONAZEPAM 0.5 MG PO TABS
0.5000 mg | ORAL_TABLET | Freq: Two times a day (BID) | ORAL | Status: DC | PRN
  Administered 2021-01-20: 11:00:00 0.5 mg via ORAL
  Filled 2021-01-19: qty 1

## 2021-01-19 NOTE — ED Notes (Signed)
Safe transport here at this time.  Pt called his family to let them know.  All belongings returned from locker #5.  No distress noted.

## 2021-01-19 NOTE — Tx Team (Signed)
Initial Treatment Plan 01/19/2021 6:02 PM [name] KGY:[id number]    PATIENT STRESSORS: Health problems     PATIENT STRENGTHS: Ability for insight  Average or above average intelligence  Capable of independent living  Communication skills  General fund of knowledge  Motivation for treatment/growth  Supportive family/friends  Work skills    PATIENT IDENTIFIED PROBLEMS: depression  Suicidal ideation                   DISCHARGE CRITERIA:  Improved stabilization in mood, thinking, and/or behavior Motivation to continue treatment in a less acute level of care Need for constant or close observation no longer present Reduction of life-threatening or endangering symptoms to within safe limits Verbal commitment to aftercare and medication compliance  PRELIMINARY DISCHARGE PLAN: Outpatient therapy Return to previous living arrangement Return to previous work or school arrangements  PATIENT/FAMILY INVOLVEMENT: This treatment plan has been presented to and reviewed with the patient, Mathew Moore, and/or family member.  The patient and family have been given the opportunity to ask questions and make suggestions.  Judie Petit, RN 01/19/2021, 6:02 PM

## 2021-01-19 NOTE — Progress Notes (Signed)
Pt admitted voluntarily to [facility] adult in-patient. Pt has been experiencing worsening depression for the last 2-3 months.  Pt has been out of work since April due to seizure activity.  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  Due to seizure activity he is no longer driving or working at this time which has interrupted his daily activities.  Pt stated he wife has been noticing a change in his mood.  Pt stated some of the things he was saying and his actions concerned his wife who suggested he come in for evaluation.  Pt denied SI, HI and AVH at the time of admission.  Fifteen minute checks initiated for patient safety.  Pt safe on unit.

## 2021-01-19 NOTE — BH Assessment (Signed)
Comprehensive Clinical Assessment (CCA) Note  01/19/2021 Mathew Moore [id number]  Disposition: Per Mathew Ala, NP, patient recommended for inpatient treatment.   [place] ED from 01/19/2021 in [facility] ED from 01/11/2021 in [place] [facility] from 01/01/2021 in [place] Moderate Risk No Risk No Risk      The patient demonstrates the following risk factors for suicide: Chronic risk factors for suicide include: psychiatric disorder of MDD and medical illness seizure disorder . Acute risk factors for suicide include: unemployment, social withdrawal/isolation, and loss (financial, interpersonal, professional). Protective factors for this patient include: positive therapeutic relationship and responsibility to others (children, family). Considering these factors, the overall suicide risk at this point appears to be moderate. Patient is not appropriate for outpatient follow up.   Mathew Moore is a 55 year old male presenting to [facility] voluntarily with chief complaint of worsening depression over the past few months. Patient reports having a seizure in February which has prevented him from going to work. Patient report feeling like his independence was taken from him because he can't drive himself anywhere. Patient reports feeling like he is a burden to his family and reports periods of SI. Patient denies active SI today however reports if he was going to kill himself, he would use a gun and would not tell anyone before he killed himself. Patient reports his wife attempted suicide before by OD and he reports OD is attention seeking and if he was to kill himself he wouldn't tell anyone because he wouldn't want anyone to talk him out of it.     Patient reports depressive symptoms and has outpatient services to include therapy and medication management. Patient denies history of inpatient treatment.  Patient also denies substance use. Patient lives at home with his wife and 70 year old son. Patient reports having several firearms which are being secured by his wife and son. Patient was working at [employer] before having seizures but currently he is not working and reports financial issues. Patient denies legal issues.  Patient oriented to x5. Patient is alert, engaged and cooperative during assessment. Patient eye contact and speech is normal, patient affect is euthymic with congruent mood. Patient denies current SI, HI, AVH.   Chief Complaint:  Chief Complaint  Patient presents with   Depression    Mathew Moore 55 year old male present to [facility] w/ compliant of depression. Reported 05/06/20 had a seizure at work. Multiple medical tests completed to determine what is causes the seizures. Report he sees a therapist Mathew Moore and Mathew Moore-psychiatrist at [facility]. Report within the last 37-months depression with suicidal ideations no plan. "I don't have a plan if I do it will be with a gun, it would want it to be over but I am not at the point." No homicidal ideations, no AVH.    Visit Diagnosis: Severe episode of recurrent major depressive disorder, without psychotic features (Mathew Moore)    CCA Screening, Triage and Referral (STR)  Patient Reported Information How did you hear about [place]? Family/Friend (Brought by wife Mathew Moore)  What Is the Reason for Your Visit/Call Today? Mathew Moore 55 year old male present to [facility] w/ compliant of depression. Reported 05/06/20 had a seizure at work. Multiple medical tests completed to determine what is causes the seizures. Report he sees a therapist Mathew Moore and Mathew Moore-psychiatrist at [facility]. Report within the last 82-months depression with suicidal ideations no plan. "I don't have a plan if I do it  will be with a gun, it would want it to be over but I am not at the point." No homicidal ideations, no AVH. Depressive symptoms;  increased sleeping, eating decreased, hopefulness, worthlessness, suicidal ideations with plan. Hope for furture grandchild on the way. Report firearms in the home, safety discussed safe is combination will give code to brother who's lives outside the home. Denied history of mental health. Denied history of substance use.  How Long Has This Been Causing You Problems? 1 wk - 1 month (Report depressive symptoms started 25-months.)  What Do You Feel Would Help You the Most Today? Stress Management   Have You Recently Had Any Thoughts About Hurting Yourself? No (currently denied suicidal ideations. Report SI thoughts triggered by depressive symptoms some days.)  Are You Planning to Commit Suicide/Harm Yourself At This time? No   Have you Recently Had Thoughts About Mathew Moore? No  Are You Planning to Harm Someone at This Time? No  Explanation: No data recorded  Have You Used Any Alcohol or Drugs in the Past 24 Hours? No  How Long Ago Did You Use Drugs or Alcohol? No data recorded What Did You Use and How Much? No data recorded  Do You Currently Have a Therapist/Psychiatrist? No data recorded Name of Therapist/Psychiatrist: No data recorded  Have You Been Recently Discharged From Any Office Practice or Programs? No data recorded Explanation of Discharge From Practice/Program: No data recorded    CCA Screening Triage Referral Assessment Type of Contact: No data recorded Telemedicine Service Delivery:   Is this Initial or Reassessment? No data recorded Date Telepsych consult ordered in [facility]:  No data recorded Time Telepsych consult ordered in [facility]:  No data recorded Location of Assessment: No data recorded Provider Location: No data recorded  Collateral Involvement: No data recorded  Does Patient Have a [place]? No data recorded Name and Contact of Legal Guardian: No data recorded If Minor and Not Living with Parent(s), Who has Custody? No data  recorded Is CPS involved or ever been involved? No data recorded Is APS involved or ever been involved? No data recorded  Patient Determined To Be At Risk for Harm To Self or Others Based on Review of Patient Reported Information or Presenting Complaint? No data recorded Method: No data recorded Availability of Means: No data recorded Intent: No data recorded Notification Required: No data recorded Additional Information for Danger to Others Potential: No data recorded Additional Comments for Danger to Others Potential: No data recorded Are There Guns or Other Weapons in Your Home? No data recorded Types of Guns/Weapons: No data recorded Are These Weapons Safely Secured?                            No data recorded Who Could Verify You Are Able To Have These Secured: No data recorded Do You Have any Outstanding Charges, Pending Court Dates, Parole/Probation? No data recorded Contacted To Inform of Risk of Harm To Self or Others: No data recorded   Does Patient Present under Involuntary Commitment? No data recorded IVC Papers Initial File Date: No data recorded  [place] of Residence: No data recorded  Patient Currently Receiving the Following Services: No data recorded  Determination of Need: Routine (7 days)   Options For Referral: Other: Comment (continue current treatment)     CCA Biopsychosocial Patient Reported Schizophrenia/Schizoaffective Diagnosis in Past: No   Strengths: No data recorded  Mental Health Symptoms  Depression:   Change in energy/activity; Difficulty Concentrating; Hopelessness; Irritability; Sleep (too much or little); Worthlessness   Duration of Depressive symptoms:  Duration of Depressive Symptoms: Greater than two weeks   Mania:   N/A   Anxiety:    Worrying; Tension   Psychosis:   None   Duration of Psychotic symptoms:    Trauma:   None   Obsessions:   None   Compulsions:   None   Inattention:   None    Hyperactivity/Impulsivity:   None   Oppositional/Defiant Behaviors:   None   Emotional Irregularity:   None   Other Mood/Personality Symptoms:  No data recorded   Mental Status Exam Appearance and self-care  Stature:   Average   Weight:   Average weight   Clothing:   Neat/clean; Age-appropriate   Grooming:   Normal   Cosmetic use:   None   Posture/gait:   Normal   Motor activity:   Not Remarkable   Sensorium  Attention:   Normal   Concentration:   Normal   Orientation:   X5   Recall/memory:   Normal   Affect and Mood  Affect:   Full Range   Mood:   Euthymic   Relating  Eye contact:   Normal   Facial expression:   Responsive   Attitude toward examiner:   Cooperative   Thought and Language  Speech flow:  Clear and Coherent   Thought content:   Appropriate to Mood and Circumstances   Preoccupation:   None   Hallucinations:   None   Organization:  No data recorded  [employer] of Knowledge:   Good   Intelligence:   Average   Abstraction:   Normal   Judgement:   Good   Reality Testing:   Adequate   Insight:   Good   Decision Making:   Normal   Social Functioning  Social Maturity:   Responsible   Social Judgement:   Normal   Stress  Stressors:   Illness; Financial; Relationship   Coping Ability:   Overwhelmed   Skill Deficits:   None   Supports:   Family; Friends/Service system     Religion: Religion/Spirituality Are You A Religious Person?: No  Leisure/Recreation:    Exercise/Diet: Exercise/Diet Do You Have Any Trouble Sleeping?: Yes   CCA Employment/Education Employment/Work Situation: Employment / Work Situation Employment Situation: Unemployed Work Stressors: unable to work due to seizures Patient's Job has Been Impacted by Current Illness: No  Education: Education Is Patient Currently Attending School?: No   CCA Family/Childhood History Family and  Relationship History: Family history Marital status: Married What types of issues is patient dealing with in the relationship?: finacial issues Does patient have children?: Yes How many children?: 2 How is patient's relationship with their children?: good  Childhood History:     Child/Adolescent Assessment:     CCA Substance Use Alcohol/Drug Use: Alcohol / Drug Use Pain Medications: See MAR Prescriptions: See MAR Over the Counter: See MAR History of alcohol / drug use?: No history of alcohol / drug abuse                         ASAM's:  Six Dimensions of Multidimensional Assessment  Dimension 1:  Acute Intoxication and/or Withdrawal Potential:      Dimension 2:  Biomedical Conditions and Complications:      Dimension 3:  Emotional, Behavioral, or Cognitive Conditions and Complications:  Dimension 4:  Readiness to Change:     Dimension 5:  Relapse, Continued use, or Continued Problem Potential:     Dimension 6:  Recovery/Living Environment:     ASAM Severity Score:    ASAM Recommended Level of Treatment:     Substance use Disorder (SUD)    Recommendations for Services/Supports/Treatments:    Discharge Disposition:    DSM5 Diagnoses: Patient Active Problem List   Diagnosis Date Noted   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   Sinusitis, chronic 06/11/2020   Deviated septum 06/11/2020   Nasal turbinate hypertrophy 06/11/2020   Essential hypertension 05/31/2020   Observed seizure-like activity ([place]) 05/30/2020   Syncope 05/14/2020   Hypokalemia 05/14/2020   Anxiety    Seizure-like activity ([place])    Sarcoidosis 08/10/2019   CAD (coronary artery disease) 07/2019   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   Lymphadenopathy, mediastinal 06/11/2019   Otorrhea, right 12/07/2018   Abnormal stress test    Chest pain    Encounter for screening colonoscopy 01/18/2018   Acute suppurative otitis media of left ear without spontaneous rupture of tympanic membrane  09/07/2017   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/22/2016   Eustachian tube dysfunction, bilateral 07/29/2015   Myringotomy tube status 07/29/2015   Dyspnea 04/02/2013     Referrals to Alternative Service(s): Referred to Alternative Service(s):   Place:   Date:   Time:    Referred to Alternative Service(s):   Place:   Date:   Time:    Referred to Alternative Service(s):   Place:   Date:   Time:    Referred to Alternative Service(s):   Place:   Date:   Time:     Luther Redo, [facility]

## 2021-01-19 NOTE — BHH Counselor (Signed)
TTS discussed safety plan with wife. She expressed she would contact patient's brother Octavia Bruckner [phone]). TTS witnessed patient's wife call the patient's brother discussing the safety plan. The brother agreed he would go to the home, change the combination of the safe, place all firearms in the safe as well as hunting knives. The brother will be the only one who will have the combination.

## 2021-01-19 NOTE — ED Notes (Addendum)
Pt awake and alert .  Flat affect but good eye contact.  Pt states that he has been struggling with anxiety and depression "for a while now"  Reports thoughts of self harm  but does not have a plan.   Pt offered meal. And ate lunch.  Pt currently sleeping without distress.

## 2021-01-19 NOTE — ED Provider Notes (Signed)
FBC/OBS ASAP Discharge Summary  Date and Time: 01/19/2021 4:13 PM  Name: Mathew Moore  MRN:  [id number]   Discharge Diagnoses:  Final diagnoses:  Severe episode of recurrent major depressive disorder, without psychotic features ([place])     Dutch Quint to be accepted to [facility]   Per admission assessment note: Mathew Moore presents to [facility] Voluntary.  He reports worsening depression and passive suicidal ideations.  States multiple stressors that has contributed to his depression lately.  States he is currently unemployment because he has seizure at work and has not been able to drive back and forth to work for the past 10 months.  Reports symptoms of worry related to his son being in the [employer]. Reported financial strains and feels like a burden to family and friends.   Stay Summary:  patient to be transferred by safe transport. Medication to be restarted.   Total Time spent with patient: 15 minutes  Past Psychiatric History:  Past Medical History:  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   Pschogenic nonepileptic, last one 11/28/20   Psychogenic nonepileptic seizure    Sarcoidosis 07/2019    Past Surgical History:  Procedure Laterality Date   BRONCHOSCOPY     CLEFT LIP REPAIR     COLONOSCOPY     HERNIA REPAIR Left    LEFT HEART CATH AND CORONARY ANGIOGRAPHY N/A 08/23/2018   Procedure: LEFT HEART CATH AND CORONARY ANGIOGRAPHY;  Surgeon: Burnell Blanks, MD;  Location: [place] [facility];  Service: Cardiovascular;  Laterality: N/A;   MEDIASTINOSCOPY N/A 07/20/2019   Procedure: MEDIASTINOSCOPY;  Surgeon: Melrose Nakayama, MD;  Location: [place];  Service: Thoracic;  Laterality: N/A;   NASAL SEPTOPLASTY W/ TURBINOPLASTY Bilateral 12/03/2020   Procedure:  REVISION OF NASAL SEPTOPLASTY AND INFERIOR TURBINATE REDUCTION;  Surgeon: Jerrell Belfast, MD;  Location: [place];  Service: ENT;  Laterality: Bilateral;   NASAL SEPTUM SURGERY     PLANTAR FASCIA SURGERY Right    SINUS ENDO W/FUSION Bilateral 12/03/2020   Procedure: BILATERAL ENDOSCOPIC SINUS SURGERY WITH NAVIGATION;  Surgeon: Jerrell Belfast, MD;  Location: [place];  Service: ENT;  Laterality: Bilateral;   VIDEO BRONCHOSCOPY WITH ENDOBRONCHIAL ULTRASOUND N/A 07/06/2019   Procedure: VIDEO BRONCHOSCOPY WITH ENDOBRONCHIAL ULTRASOUND;  Surgeon: Marshell Garfinkel, MD;  Location: [place];  Service: Pulmonary;  Laterality: N/A;   Family History:  Family History  Problem Relation Age of Onset   Breast cancer Mother    Heart disease Father    Lung cancer Father    Heart attack Father    Family Psychiatric History:  Social History:  Social History   Substance and Sexual Activity  Alcohol Use Not Currently     Social History   Substance and Sexual Activity  Drug Use Not Currently    Social History   Socioeconomic History   Marital status: Married    Spouse name: [name]   Number of children: 2   Years of education: Not on file   Highest education level: Not on file  Occupational History   Not on file  Tobacco Use   Smoking status: Never   Smokeless tobacco: Never  Vaping Use   Vaping Use: Never used  Substance and Sexual Activity   Alcohol use: Not Currently  Drug use: Not Currently   Sexual activity: Not on file  Other Topics Concern   Not on file  Social History Narrative   Lives with wife   Social Determinants of Health   Financial Resource Strain: Not on file  Food Insecurity: Not on file  Transportation Needs: Not on file  Physical Activity: Not on file  Stress: Not on file  Social Connections: Not on file   SDOH:  SDOH Screenings   Alcohol Screen: Not on file  Depression (PHQ2-9): Not on file  Financial Resource Strain: Not on file  Food Insecurity: Not on file   Housing: Not on file  Physical Activity: Not on file  Social Connections: Not on file  Stress: Not on file  Tobacco Use: Low Risk    Smoking Tobacco Use: Never   Smokeless Tobacco Use: Never   Passive Exposure: Not on file  Transportation Needs: Not on file    Tobacco Cessation:  N/A, patient does not currently use tobacco products  Current Medications:  Current Facility-Administered Medications  Medication Dose Route Frequency Provider Last Rate Last Admin   acetaminophen (TYLENOL) tablet 650 mg  650 mg Oral Q6H PRN Derrill Center, NP       alum & mag hydroxide-simeth (MAALOX/MYLANTA) 200-200-20 MG/5ML suspension 30 mL  30 mL Oral Q4H PRN Derrill Center, NP       magnesium hydroxide (MILK OF MAGNESIA) suspension 30 mL  30 mL Oral Daily PRN Derrill Center, NP       No current outpatient medications on file.    PTA Medications: (Not in a hospital admission)   Musculoskeletal  Strength & Muscle Tone: within normal limits Gait & Station: normal Patient leans: N/A  Psychiatric Specialty Exam  Presentation  General Appearance: Appropriate for Environment  Eye Contact:Good  Speech:Clear and Coherent  Speech Volume:Normal  Handedness:Right   Mood and Affect  Mood:Anxious  Affect:Congruent   Thought Process  Thought Processes:Coherent  Descriptions of Associations:Intact  Orientation:Full (Time, Place and Person)  Thought Content:Logical  Diagnosis of Schizophrenia or Schizoaffective disorder in past: No    Hallucinations:Hallucinations: None  Ideas of Reference:None  Suicidal Thoughts:Suicidal Thoughts: Yes, Passive SI Passive Intent and/or Plan: Without Intent; Without Means to Carry Out  Homicidal Thoughts:Homicidal Thoughts: No   Sensorium  Memory:Immediate Good; Recent Good  Judgment:Fair  Insight:Fair   Executive Functions  Concentration:No data recorded Attention Span:No data recorded Recall:No data recorded Fund of Knowledge:No  data recorded Language:No data recorded  Psychomotor Activity  Psychomotor Activity:Psychomotor Activity: Normal   Assets  Assets:Desire for Improvement   Sleep  Sleep:Sleep: Fair   Nutritional Assessment (For OBS and FBC admissions only) Has the patient had a weight loss or gain of 10 pounds or more in the last 3 months?: No Has the patient had a decrease in food intake/or appetite?: No Does the patient have dental problems?: No Does the patient have eating habits or behaviors that may be indicators of an eating disorder including binging or inducing vomiting?: No Has the patient recently lost weight without trying?: 0 Has the patient been eating poorly because of a decreased appetite?: 0 Malnutrition Screening Tool Score: 0    Physical Exam  Physical Exam Vitals and nursing note reviewed.  Cardiovascular:     Rate and Rhythm: Normal rate and regular rhythm.  Pulmonary:     Effort: Pulmonary effort is normal.  Psychiatric:        Mood and Affect: Mood normal.  Thought Content: Thought content normal.   Review of Systems  Respiratory: Negative.    Musculoskeletal: Negative.   Endo/Heme/Allergies: Negative.   Psychiatric/Behavioral:  Positive for depression and suicidal ideas. Negative for substance abuse. The patient is nervous/anxious and has insomnia.   All other systems reviewed and are negative. Blood pressure 117/72, pulse 66, temperature 98.2 F (36.8 C), resp. rate 18, SpO2 100 %. There is no height or weight on file to calculate BMI.  Demographic Factors:  Male  Loss Factors: Financial problems/change in socioeconomic status  Historical Factors: Family history of mental illness or substance abuse  Risk Reduction Factors:   Sense of responsibility to family, Positive social support, and Positive therapeutic relationship  Continued Clinical Symptoms:  Depression:   Hopelessness Impulsivity  Cognitive Features That Contribute To Risk:   Closed-mindedness    Suicide Risk:  Minimal: No identifiable suicidal ideation.  Patients presenting with no risk factors but with morbid ruminations; may be classified as minimal risk based on the severity of the depressive symptoms  Plan Of Care/Follow-up recommendations:  Activity:  as tolerated Diet:  heart healthy    Patient was accepted to [facility]   Disposition: Take all medications as prescribed. Keep all follow-up appointments as scheduled.  Do not consume alcohol or use illegal drugs while on prescription medications. Report any adverse effects from your medications to your primary care provider promptly.  In the event of recurrent symptoms or worsening symptoms, call 911, a crisis hotline, or go to the nearest emergency department for evaluation.    Derrill Center, NP 01/19/2021, 4:13 PM

## 2021-01-19 NOTE — BH Assessment (Signed)
Pt agreed to have his wife take all of his belongings home with her.

## 2021-01-19 NOTE — ED Notes (Signed)
Offsite distribution called to collect STAT specimens and to deliver to [facility].

## 2021-01-19 NOTE — Discharge Instructions (Addendum)
Take all medications as prescribed. Keep all follow-up appointments as scheduled.  Do not consume alcohol or use illegal drugs while on prescription medications. Report any adverse effects from your medications to your primary care provider promptly.  In the event of recurrent symptoms or worsening symptoms, call 911, a crisis hotline, or go to the nearest emergency department for evaluation.   

## 2021-01-19 NOTE — ED Provider Notes (Signed)
Behavioral Health Admission H&P [facility] & OBS)  Date: 01/19/21 Patient Name: Mathew Moore MRN: [id number] Chief Complaint:  Chief Complaint  Patient presents with   Depression    Mathew Moore 55 year old male present to [facility] w/ compliant of depression. Reported 05/06/20 had a seizure at work. Multiple medical tests completed to determine what is causes the seizures. Report he sees a therapist [place] November and Sharrie Rothman Thornton-psychiatrist at [facility]. Report within the last 10-months depression with suicidal ideations no plan. "I don't have a plan if I do it will be with a gun, it would want it to be over but I am not at the point." No homicidal ideations, no AVH.       Diagnoses:  Final diagnoses:  Severe episode of recurrent major depressive disorder, without psychotic features ([place])    HPI: Mathew Moore Gingerich presents to [facility] urgent care voluntary.  He reports worsening depression and passive suicidal ideations.  States multiple stressors that has contributed to his depression lately.  States he is currently unemployment because he has seizure at work and has not been able to drive back and forth to work for the past 10 months.  Reports symptoms of worry related to his son being in the [employer]. Reported financial strains and feels like a burden to family and friends.    Reports he was followed by therapist, psychiatrist and neurology for medication management.  He reports ports bouts of depression however has been managed able to " shake this bout of  depression."  Reports frequent intrusive thoughts about killing himself.  Denied illicit drug use or substance abuse history.  Denied previous inpatient admissions.  Denied previous suicide attempts plan or intent.  Discussed following up for inpatient admission and consider partial hospitalization program at discharge.  PHQ 2-9:   [place] ED from 01/11/2021 in [place] ED from 01/01/2021 in [place] [facility] from 12/24/2020 in [place] No Risk No Risk No Risk        Total Time spent with patient: 15 minutes  Musculoskeletal  Strength & Muscle Tone: within normal limits Gait & Station: normal Patient leans: N/A  Psychiatric Specialty Exam  Presentation General Appearance: Appropriate for Environment  Eye Contact:Good  Speech:Clear and Coherent  Speech Volume:Normal  Handedness:Right   Mood and Affect  Mood:Anxious  Affect:Congruent   Thought Process  Thought Processes:Coherent  Descriptions of Associations:Intact  Orientation:Full (Time, Place and Person)  Thought Content:Logical    Hallucinations:Hallucinations: None  Ideas of Reference:None  Suicidal Thoughts:Suicidal Thoughts: Yes, Passive SI Passive Intent and/or Plan: Without Intent; Without Means to Carry Out  Homicidal Thoughts:Homicidal Thoughts: No   Sensorium  Memory:Immediate Good; Recent Good  Judgment:Fair  Insight:Fair   Executive Functions  Concentration:No data recorded Attention Span:No data recorded Recall:No data recorded Fund of Knowledge:No data recorded Language:No data recorded  Psychomotor Activity  Psychomotor Activity:Psychomotor Activity: Normal   Assets  Assets:Desire for Improvement   Sleep  Sleep:Sleep: Fair   Nutritional Assessment (For OBS and FBC admissions only) Has the patient had a weight loss or gain of 10 pounds or more in the last 3 months?: No Has the patient had a decrease in food intake/or appetite?: No Does the patient have dental problems?: No Does the patient have eating habits or behaviors that may be indicators of an eating disorder including binging or inducing vomiting?: No Has the patient recently lost weight without trying?: 0 Has the patient  been eating poorly because of a decreased appetite?: 0 Malnutrition Screening Tool Score: 0    Physical Exam ROS  Blood  pressure 117/72, pulse 66, temperature 98.2 F (36.8 C), resp. rate 18, SpO2 100 %. There is no height or weight on file to calculate BMI.  Past Psychiatric History: depression    Is the patient at risk to self? Yes  Has the patient been a risk to self in the past 6 months? No .    Has the patient been a risk to self within the distant past? No   Is the patient a risk to others? Yes   Has the patient been a risk to others in the past 6 months? Yes   Has the patient been a risk to others within the distant past? 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   Pschogenic nonepileptic, last one 11/28/20   Psychogenic nonepileptic seizure    Sarcoidosis 07/2019    Past Surgical History:  Procedure Laterality Date   BRONCHOSCOPY     CLEFT LIP REPAIR     COLONOSCOPY     HERNIA REPAIR Left    LEFT HEART CATH AND CORONARY ANGIOGRAPHY N/A 08/23/2018   Procedure: LEFT HEART CATH AND CORONARY ANGIOGRAPHY;  Surgeon: Burnell Blanks, MD;  Location: [place] [facility];  Service: Cardiovascular;  Laterality: N/A;   MEDIASTINOSCOPY N/A 07/20/2019   Procedure: MEDIASTINOSCOPY;  Surgeon: Melrose Nakayama, MD;  Location: [place];  Service: Thoracic;  Laterality: N/A;   NASAL SEPTOPLASTY W/ TURBINOPLASTY Bilateral 12/03/2020   Procedure: REVISION OF NASAL SEPTOPLASTY AND INFERIOR TURBINATE REDUCTION;  Surgeon: Jerrell Belfast, MD;  Location: [place];  Service: ENT;  Laterality: Bilateral;   NASAL SEPTUM SURGERY     PLANTAR FASCIA SURGERY Right    SINUS ENDO W/FUSION Bilateral 12/03/2020   Procedure: BILATERAL ENDOSCOPIC SINUS SURGERY WITH NAVIGATION;  Surgeon: Jerrell Belfast, MD;  Location: [place];  Service: ENT;  Laterality: Bilateral;   VIDEO BRONCHOSCOPY WITH ENDOBRONCHIAL  ULTRASOUND N/A 07/06/2019   Procedure: VIDEO BRONCHOSCOPY WITH ENDOBRONCHIAL ULTRASOUND;  Surgeon: Marshell Garfinkel, MD;  Location: [place];  Service: Pulmonary;  Laterality: N/A;    Family History:  Family History  Problem Relation Age of Onset   Breast cancer Mother    Heart disease Father    Lung cancer Father    Heart attack Father     Social History:  Social History   Socioeconomic History   Marital status: Married    Spouse name: Teacher, music   Number of children: 2   Years of education: Not on file   Highest education level: Not on file  Occupational History   Not on file  Tobacco Use   Smoking status: Never   Smokeless tobacco: Never  Vaping Use   Vaping Use: Never used  Substance and Sexual Activity   Alcohol use: Not Currently   Drug use: Not Currently   Sexual activity: Not on file  Other Topics Concern   Not on file  Social History Narrative   Lives with wife   Social Determinants of Health   Financial Resource Strain: Not on file  Food Insecurity: Not on file  Transportation Needs: Not on file  Physical Activity: Not on file  Stress:  Not on file  Social Connections: Not on file  Intimate Partner Violence: Not on file    SDOH:  SDOH Screenings   Alcohol Screen: Not on file  Depression (PHQ2-9): Not on file  Financial Resource Strain: Not on file  Food Insecurity: Not on file  Housing: Not on file  Physical Activity: Not on file  Social Connections: Not on file  Stress: Not on file  Tobacco Use: Low Risk    Smoking Tobacco Use: Never   Smokeless Tobacco Use: Never   Passive Exposure: Not on file  Transportation Needs: Not on file    Last Labs:  Admission on 01/01/2021, Discharged on 01/02/2021  Component Date Value Ref Range Status   Glucose-Capillary 01/01/2021 106 (H)  70 - 99 mg/dL Final   Glucose reference range applies only to samples taken after fasting for at least 8 hours.   Sodium 01/01/2021 137  135 - 145 mmol/L Final   Potassium  01/01/2021 3.7  3.5 - 5.1 mmol/L Final   Chloride 01/01/2021 103  98 - 111 mmol/L Final   CO2 01/01/2021 27  22 - 32 mmol/L Final   Glucose, Bld 01/01/2021 108 (H)  70 - 99 mg/dL Final   Glucose reference range applies only to samples taken after fasting for at least 8 hours.   BUN 01/01/2021 16  6 - 20 mg/dL Final   Creatinine, Ser 01/01/2021 0.96  0.61 - 1.24 mg/dL Final   Calcium 01/01/2021 8.9  8.9 - 10.3 mg/dL Final   Total Protein 01/01/2021 6.5  6.5 - 8.1 g/dL Final   Albumin 01/01/2021 4.0  3.5 - 5.0 g/dL Final   AST 01/01/2021 28  15 - 41 U/L Final   ALT 01/01/2021 27  0 - 44 U/L Final   Alkaline Phosphatase 01/01/2021 89  38 - 126 U/L Final   Total Bilirubin 01/01/2021 0.4  0.3 - 1.2 mg/dL Final   GFR, Estimated 01/01/2021 >60  >60 mL/min Final   Comment: (NOTE) Calculated using the CKD-EPI Creatinine Equation (2021)    Anion gap 01/01/2021 7  5 - 15 Final   Performed at [facility], [address] [place], [place] [address]   WBC 01/01/2021 5.7  4.0 - 10.5 K/uL Final   RBC 01/01/2021 4.42  4.22 - 5.81 MIL/uL Final   Hemoglobin 01/01/2021 13.5  13.0 - 17.0 g/dL Final   HCT 01/01/2021 40.0  39.0 - 52.0 % Final   MCV 01/01/2021 90.5  80.0 - 100.0 fL Final   MCH 01/01/2021 30.5  26.0 - 34.0 pg Final   MCHC 01/01/2021 33.8  30.0 - 36.0 g/dL Final   RDW 01/01/2021 13.3  11.5 - 15.5 % Final   Platelets 01/01/2021 298  150 - 400 K/uL Final   nRBC 01/01/2021 0.0  0.0 - 0.2 % Final   Neutrophils Relative % 01/01/2021 52  % Final   Neutro Abs 01/01/2021 3.0  1.7 - 7.7 K/uL Final   Lymphocytes Relative 01/01/2021 35  % Final   Lymphs Abs 01/01/2021 2.0  0.7 - 4.0 K/uL Final   Monocytes Relative 01/01/2021 9  % Final   Monocytes Absolute 01/01/2021 0.5  0.1 - 1.0 K/uL Final   Eosinophils Relative 01/01/2021 3  % Final   Eosinophils Absolute 01/01/2021 0.2  0.0 - 0.5 K/uL Final   Basophils Relative 01/01/2021 1  % Final   Basophils Absolute 01/01/2021 0.0  0.0 - 0.1 K/uL Final    Immature Granulocytes 01/01/2021 0  % Final  Abs Immature Granulocytes 01/01/2021 0.01  0.00 - 0.07 K/uL Final   Performed at [facility], [address] [place], [place] [address]  Admission on 12/24/2020, Discharged on 12/24/2020  Component Date Value Ref Range Status   Sodium 12/24/2020 139  135 - 145 mmol/L Final   Potassium 12/24/2020 3.5  3.5 - 5.1 mmol/L Final   Chloride 12/24/2020 104  98 - 111 mmol/L Final   CO2 12/24/2020 28  22 - 32 mmol/L Final   Glucose, Bld 12/24/2020 97  70 - 99 mg/dL Final   Glucose reference range applies only to samples taken after fasting for at least 8 hours.   BUN 12/24/2020 13  6 - 20 mg/dL Final   Creatinine, Ser 12/24/2020 0.92  0.61 - 1.24 mg/dL Final   Calcium 12/24/2020 9.1  8.9 - 10.3 mg/dL Final   Total Protein 12/24/2020 7.3  6.5 - 8.1 g/dL Final   Albumin 12/24/2020 4.6  3.5 - 5.0 g/dL Final   AST 12/24/2020 29  15 - 41 U/L Final   ALT 12/24/2020 29  0 - 44 U/L Final   Alkaline Phosphatase 12/24/2020 105  38 - 126 U/L Final   Total Bilirubin 12/24/2020 0.7  0.3 - 1.2 mg/dL Final   GFR, Estimated 12/24/2020 >60  >60 mL/min Final   Comment: (NOTE) Calculated using the CKD-EPI Creatinine Equation (2021)    Anion gap 12/24/2020 7  5 - 15 Final   Performed at [facility], [address], [place], [place] [address]   WBC 12/24/2020 6.0  4.0 - 10.5 K/uL Final   RBC 12/24/2020 4.79  4.22 - 5.81 MIL/uL Final   Hemoglobin 12/24/2020 14.0  13.0 - 17.0 g/dL Final   HCT 12/24/2020 42.4  39.0 - 52.0 % Final   MCV 12/24/2020 88.5  80.0 - 100.0 fL Final   MCH 12/24/2020 29.2  26.0 - 34.0 pg Final   MCHC 12/24/2020 33.0  30.0 - 36.0 g/dL Final   RDW 12/24/2020 13.2  11.5 - 15.5 % Final   Platelets 12/24/2020 345  150 - 400 K/uL Final   nRBC 12/24/2020 0.0  0.0 - 0.2 % Final   Neutrophils Relative % 12/24/2020 56  % Final   Neutro Abs 12/24/2020 3.4  1.7 - 7.7 K/uL Final   Lymphocytes Relative 12/24/2020 34  % Final   Lymphs Abs 12/24/2020 2.1   0.7 - 4.0 K/uL Final   Monocytes Relative 12/24/2020 6  % Final   Monocytes Absolute 12/24/2020 0.3  0.1 - 1.0 K/uL Final   Eosinophils Relative 12/24/2020 3  % Final   Eosinophils Absolute 12/24/2020 0.2  0.0 - 0.5 K/uL Final   Basophils Relative 12/24/2020 1  % Final   Basophils Absolute 12/24/2020 0.1  0.0 - 0.1 K/uL Final   Immature Granulocytes 12/24/2020 0  % Final   Abs Immature Granulocytes 12/24/2020 0.02  0.00 - 0.07 K/uL Final   Performed at [facility], [address] [place], [place] [address]   Magnesium 12/24/2020 2.1  1.7 - 2.4 mg/dL Final   Performed at [facility], [address] [place], [place] [address]   Color, Urine 12/24/2020 YELLOW  YELLOW Final   APPearance 12/24/2020 CLEAR  CLEAR Final   Specific Gravity, Urine 12/24/2020 1.015  1.005 - 1.030 Final   pH 12/24/2020 7.5  5.0 - 8.0 Final   Glucose, UA 12/24/2020 NEGATIVE  NEGATIVE mg/dL Final   Hgb urine dipstick 12/24/2020 NEGATIVE  NEGATIVE Final   Bilirubin Urine 12/24/2020  NEGATIVE  NEGATIVE Final   Ketones, ur 12/24/2020 NEGATIVE  NEGATIVE mg/dL Final   Protein, ur 12/24/2020 NEGATIVE  NEGATIVE mg/dL Final   Nitrite 12/24/2020 NEGATIVE  NEGATIVE Final   Leukocytes,Ua 12/24/2020 NEGATIVE  NEGATIVE Final   Comment: Microscopic not done on urines with negative protein, blood, leukocytes, nitrite, or glucose < 500 mg/dL. Performed at [facility], [address] [place], [place] [address]    Opiates 12/24/2020 NONE DETECTED  NONE DETECTED Final   Cocaine 12/24/2020 NONE DETECTED  NONE DETECTED Final   Benzodiazepines 12/24/2020 NONE DETECTED  NONE DETECTED Final   Amphetamines 12/24/2020 NONE DETECTED  NONE DETECTED Final   Tetrahydrocannabinol 12/24/2020 NONE DETECTED  NONE DETECTED Final   Barbiturates 12/24/2020 NONE DETECTED  NONE DETECTED Final   Comment: (NOTE) DRUG SCREEN FOR MEDICAL PURPOSES ONLY.  IF CONFIRMATION IS NEEDED FOR ANY PURPOSE, NOTIFY LAB WITHIN 5 DAYS.  LOWEST DETECTABLE  LIMITS FOR URINE DRUG SCREEN Drug Class                     Cutoff (ng/mL) Amphetamine and metabolites    1000 Barbiturate and metabolites    200 Benzodiazepine                 086 Tricyclics and metabolites     300 Opiates and metabolites        300 Cocaine and metabolites        300 THC                            50 Performed at [facility], [address], [place], [place] [address]   Clinical Support on 12/23/2020  Component Date Value Ref Range Status   FVC-Pre 12/23/2020 3.60  L Final   FVC-%Pred-Pre 12/23/2020 78  % Final   FVC-Post 12/23/2020 3.60  L Final   FVC-%Pred-Post 12/23/2020 78  % Final   FVC-%Change-Post 12/23/2020 0  % Final   FEV1-Pre 12/23/2020 2.82  L Final   FEV1-%Pred-Pre 12/23/2020 79  % Final   FEV1-Post 12/23/2020 2.97  L Final   FEV1-%Pred-Post 12/23/2020 84  % Final   FEV1-%Change-Post 12/23/2020 5  % Final   FEV6-Pre 12/23/2020 3.58  L Final   FEV6-%Pred-Pre 12/23/2020 81  % Final   FEV6-Post 12/23/2020 3.60  L Final   FEV6-%Pred-Post 12/23/2020 82  % Final   FEV6-%Change-Post 12/23/2020 0  % Final   Pre FEV1/FVC ratio 12/23/2020 78  % Final   FEV1FVC-%Pred-Pre 12/23/2020 101  % Final   Post FEV1/FVC ratio 12/23/2020 82  % Final   FEV1FVC-%Change-Post 12/23/2020 5  % Final   Pre FEV6/FVC Ratio 12/23/2020 99  % Final   FEV6FVC-%Pred-Pre 12/23/2020 103  % Final   Post FEV6/FVC ratio 12/23/2020 100  % Final   FEV6FVC-%Pred-Post 12/23/2020 104  % Final   FEV6FVC-%Change-Post 12/23/2020 0  % Final   FEF 25-75 Pre 12/23/2020 2.35  L/sec Final   FEF2575-%Pred-Pre 12/23/2020 75  % Final   FEF 25-75 Post 12/23/2020 3.09  L/sec Final   FEF2575-%Pred-Post 12/23/2020 99  % Final   FEF2575-%Change-Post 12/23/2020 31  % Final   RV 12/23/2020 2.36  L Final   RV % pred 12/23/2020 120  % Final   TLC 12/23/2020 6.16  L Final   TLC % pred 12/23/2020 95  % Final   DLCO unc 12/23/2020 29.88  ml/min/mmHg Final   DLCO unc % pred 12/23/2020 111  %  Final   DLCO  cor 12/23/2020 31.50  ml/min/mmHg Final   DLCO cor % pred 12/23/2020 118  % Final   DL/VA 12/23/2020 5.71  ml/min/mmHg/L Final   DL/VA % pred 12/23/2020 129  % Final  Admission on 12/13/2020, Discharged on 12/13/2020  Component Date Value Ref Range Status   Sodium 12/13/2020 139  135 - 145 mmol/L Final   Potassium 12/13/2020 3.4 (L)  3.5 - 5.1 mmol/L Final   Chloride 12/13/2020 106  98 - 111 mmol/L Final   CO2 12/13/2020 28  22 - 32 mmol/L Final   Glucose, Bld 12/13/2020 139 (H)  70 - 99 mg/dL Final   Glucose reference range applies only to samples taken after fasting for at least 8 hours.   BUN 12/13/2020 19  6 - 20 mg/dL Final   Creatinine, Ser 12/13/2020 1.39 (H)  0.61 - 1.24 mg/dL Final   Calcium 12/13/2020 8.7 (L)  8.9 - 10.3 mg/dL Final   Total Protein 12/13/2020 6.6  6.5 - 8.1 g/dL Final   Albumin 12/13/2020 3.8  3.5 - 5.0 g/dL Final   AST 12/13/2020 24  15 - 41 U/L Final   ALT 12/13/2020 27  0 - 44 U/L Final   Alkaline Phosphatase 12/13/2020 103  38 - 126 U/L Final   Total Bilirubin 12/13/2020 0.4  0.3 - 1.2 mg/dL Final   GFR, Estimated 12/13/2020 >60  >60 mL/min Final   Comment: (NOTE) Calculated using the CKD-EPI Creatinine Equation (2021)    Anion gap 12/13/2020 5  5 - 15 Final   Performed at [facility], [address] [place], [place] [address]   WBC 12/13/2020 6.4  4.0 - 10.5 K/uL Final   RBC 12/13/2020 4.25  4.22 - 5.81 MIL/uL Final   Hemoglobin 12/13/2020 12.9 (L)  13.0 - 17.0 g/dL Final   HCT 12/13/2020 37.8 (L)  39.0 - 52.0 % Final   MCV 12/13/2020 88.9  80.0 - 100.0 fL Final   MCH 12/13/2020 30.4  26.0 - 34.0 pg Final   MCHC 12/13/2020 34.1  30.0 - 36.0 g/dL Final   RDW 12/13/2020 13.4  11.5 - 15.5 % Final   Platelets 12/13/2020 339  150 - 400 K/uL Final   nRBC 12/13/2020 0.0  0.0 - 0.2 % Final   Neutrophils Relative % 12/13/2020 52  % Final   Neutro Abs 12/13/2020 3.4  1.7 - 7.7 K/uL Final   Lymphocytes Relative 12/13/2020 36  % Final   Lymphs Abs  12/13/2020 2.3  0.7 - 4.0 K/uL Final   Monocytes Relative 12/13/2020 8  % Final   Monocytes Absolute 12/13/2020 0.5  0.1 - 1.0 K/uL Final   Eosinophils Relative 12/13/2020 3  % Final   Eosinophils Absolute 12/13/2020 0.2  0.0 - 0.5 K/uL Final   Basophils Relative 12/13/2020 1  % Final   Basophils Absolute 12/13/2020 0.1  0.0 - 0.1 K/uL Final   Immature Granulocytes 12/13/2020 0  % Final   Abs Immature Granulocytes 12/13/2020 0.02  0.00 - 0.07 K/uL Final   Performed at [facility], [address], [place], [place] [address]  Admission on 12/03/2020, Discharged on 12/03/2020  Component Date Value Ref Range Status   SURGICAL PATHOLOGY 12/03/2020    Final-Edited                   Value:SURGICAL PATHOLOGY CASE: [id number] PATIENT: Corrie Corriveau Surgical Pathology Report     Clinical History: Chronic pansinusitis, deviated septum, nasal turbinate hypertrophy (nt)  FINAL MICROSCOPIC DIAGNOSIS:  A. SINUS CONTENTS: - Sinus mucosa with mild chronic inflammation.   GROSS DESCRIPTION:  Received fresh are fragments of soft tan-pink tissue fragments and tan-white osteocartilaginous tissue which have an aggregate measurement of 1 x 1 x 0.3 cm.  Sections are submitted in 1 cassette.  [facility] 12/03/2020)   Final Diagnosis performed by Claudette Laws, MD.   Electronically signed 12/04/2020 Technical component performed at [facility]. [facility], [place] [address], [place], [place] [address].  Professional component performed at [facility], [place] [address] [place], [place] [address].  Immunohistochemistry Technical component (if applicable) was performed at [facility]. [address] [place]                         [place], [place] [address].   IMMUNOHISTOCHEMISTRY DISCLAIMER (if applicable): Some of these immunohistochemical stains may have been developed and the performance characteristics determine by [facility].  Some may not have been cleared or approved by the U.S. Food and Drug Administration. The FDA has determined that such clearance or approval is not necessary. This test is used for clinical purposes. It should not be regarded as investigational or for research. This laboratory is certified under the [place] (CLIA-88) as qualified to perform high complexity clinical laboratory testing.  The controls stained appropriately.   Admission on 11/08/2020, Discharged on 11/08/2020  Component Date Value Ref Range Status   WBC 11/08/2020 4.2  4.0 - 10.5 K/uL Final   RBC 11/08/2020 4.55  4.22 - 5.81 MIL/uL Final   Hemoglobin 11/08/2020 13.3  13.0 - 17.0 g/dL Final   HCT 11/08/2020 40.3  39.0 - 52.0 % Final   MCV 11/08/2020 88.6  80.0 - 100.0 fL Final   MCH 11/08/2020 29.2  26.0 - 34.0 pg Final   MCHC 11/08/2020 33.0  30.0 - 36.0 g/dL Final   RDW 11/08/2020 13.5  11.5 - 15.5 % Final   Platelets 11/08/2020 254  150 - 400 K/uL Final   nRBC 11/08/2020 0.0  0.0 - 0.2 % Final   Neutrophils Relative % 11/08/2020 57  % Final   Neutro Abs 11/08/2020 2.4  1.7 - 7.7 K/uL Final   Lymphocytes Relative 11/08/2020 28  % Final   Lymphs Abs 11/08/2020 1.2  0.7 - 4.0 K/uL Final   Monocytes Relative 11/08/2020 14  % Final   Monocytes Absolute 11/08/2020 0.6  0.1 - 1.0 K/uL Final   Eosinophils Relative 11/08/2020 0  % Final   Eosinophils Absolute 11/08/2020 0.0  0.0 - 0.5 K/uL Final   Basophils Relative 11/08/2020 1  % Final   Basophils Absolute 11/08/2020 0.0  0.0 - 0.1 K/uL Final   Immature Granulocytes 11/08/2020 0  % Final   Abs Immature Granulocytes 11/08/2020 0.01  0.00 - 0.07 K/uL Final   Performed at [facility], [address], [place], [place] [address]   Sodium 11/08/2020 137  135 - 145 mmol/L Final   Potassium 11/08/2020 3.6  3.5 - 5.1 mmol/L Final   Chloride 11/08/2020 106  98 - 111 mmol/L Final   CO2 11/08/2020 26  22 - 32 mmol/L Final   Glucose, Bld  11/08/2020 134 (H)  70 - 99 mg/dL Final   Glucose reference range applies only to samples taken after fasting for at least 8 hours.   BUN 11/08/2020 11  6 - 20 mg/dL Final   Creatinine, Ser 11/08/2020 0.90  0.61 - 1.24 mg/dL Final   Calcium 11/08/2020 8.5 (L)  8.9 - 10.3 mg/dL Final   Total Protein 11/08/2020 6.7  6.5 - 8.1 g/dL Final   Albumin 11/08/2020 4.1  3.5 - 5.0 g/dL Final   AST 11/08/2020 24  15 - 41 U/L Final   ALT 11/08/2020 30  0 - 44 U/L Final   Alkaline Phosphatase 11/08/2020 90  38 - 126 U/L Final   Total Bilirubin 11/08/2020 0.7  0.3 - 1.2 mg/dL Final   GFR, Estimated 11/08/2020 >60  >60 mL/min Final   Comment: (NOTE) Calculated using the CKD-EPI Creatinine Equation (2021)    Anion gap 11/08/2020 5  5 - 15 Final   Performed at [facility], [address] [place], [place] [address]  Admission on 08/17/2020, Discharged on 08/17/2020  Component Date Value Ref Range Status   Sodium 08/17/2020 137  135 - 145 mmol/L Final   Potassium 08/17/2020 3.6  3.5 - 5.1 mmol/L Final   Chloride 08/17/2020 104  98 - 111 mmol/L Final   CO2 08/17/2020 28  22 - 32 mmol/L Final   Glucose, Bld 08/17/2020 88  70 - 99 mg/dL Final   Glucose reference range applies only to samples taken after fasting for at least 8 hours.   BUN 08/17/2020 16  6 - 20 mg/dL Final   Creatinine, Ser 08/17/2020 1.10  0.61 - 1.24 mg/dL Final   Calcium 08/17/2020 8.9  8.9 - 10.3 mg/dL Final   Total Protein 08/17/2020 7.0  6.5 - 8.1 g/dL Final   Albumin 08/17/2020 4.3  3.5 - 5.0 g/dL Final   AST 08/17/2020 25  15 - 41 U/L Final   ALT 08/17/2020 32  0 - 44 U/L Final   Alkaline Phosphatase 08/17/2020 71  38 - 126 U/L Final   Total Bilirubin 08/17/2020 0.6  0.3 - 1.2 mg/dL Final   GFR, Estimated 08/17/2020 >60  >60 mL/min Final   Comment: (NOTE) Calculated using the CKD-EPI Creatinine Equation (2021)    Anion gap 08/17/2020 5  5 - 15 Final   Performed at [facility], [address] [place], [place] [address]   WBC  08/17/2020 5.7  4.0 - 10.5 K/uL Final   RBC 08/17/2020 4.63  4.22 - 5.81 MIL/uL Final   Hemoglobin 08/17/2020 13.6  13.0 - 17.0 g/dL Final   HCT 08/17/2020 41.2  39.0 - 52.0 % Final   MCV 08/17/2020 89.0  80.0 - 100.0 fL Final   MCH 08/17/2020 29.4  26.0 - 34.0 pg Final   MCHC 08/17/2020 33.0  30.0 - 36.0 g/dL Final   RDW 08/17/2020 13.3  11.5 - 15.5 % Final   Platelets 08/17/2020 288  150 - 400 K/uL Final   nRBC 08/17/2020 0.0  0.0 - 0.2 % Final   Neutrophils Relative % 08/17/2020 45  % Final   Neutro Abs 08/17/2020 2.5  1.7 - 7.7 K/uL Final   Lymphocytes Relative 08/17/2020 42  % Final   Lymphs Abs 08/17/2020 2.4  0.7 - 4.0 K/uL Final   Monocytes Relative 08/17/2020 8  % Final   Monocytes Absolute 08/17/2020 0.5  0.1 - 1.0 K/uL Final   Eosinophils Relative 08/17/2020 4  % Final   Eosinophils Absolute 08/17/2020 0.2  0.0 - 0.5 K/uL Final   Basophils Relative 08/17/2020 1  % Final   Basophils Absolute 08/17/2020 0.1  0.0 - 0.1 K/uL Final   Immature Granulocytes 08/17/2020 0  % Final   Abs Immature Granulocytes 08/17/2020  0.01  0.00 - 0.07 K/uL Final   Performed at [facility], [address], [place], [place] [address]   Alcohol, Ethyl (B) 08/17/2020 <10  <10 mg/dL Final   Comment: (NOTE) Lowest detectable limit for serum alcohol is 10 mg/dL.  For medical purposes only. Performed at [facility], [address], [place], [place] [address]     Allergies: Patient has no known allergies.  PTA Medications: (Not in a hospital admission)   Medical Decision Making   Inpatient admission  Will restart home medication where appropriate.    Recommendations  Based on my evaluation the patient appears to have an emergency medical condition for which I recommend the patient be transferred to the emergency department for further evaluation.  Derrill Center, NP 01/19/21  11:50 AM

## 2021-01-19 NOTE — Progress Notes (Signed)
°   01/19/21 1127  [place] (Walk-ins at [facility] only)  How Did You Hear About [place]? Family/Friend (Brought by wife Mathew Moore)  What Is the Reason for Your Visit/Call Today? Mathew Moore 55 year old male present to [facility] w/ compliant of depression. Reported 05/06/20 had a seizure at work. Multiple medical tests completed to determine what is causes the seizures. Report he sees a therapist Mathew Moore and Mathew Moore-psychiatrist at [facility]. Report within the last 7-months depression with suicidal ideations no plan. "I don't have a plan if I do it will be with a gun, it would want it to be over but I am not at the point." No homicidal ideations, no AVH. Depressive symptoms; increased sleeping, eating decreased, hopefulness, worthlessness, suicidal ideations with plan. Hope for furture grandchild on the way. Report firearms in the home, safety discussed safe is combination will give code to brother who's lives outside the home. Denied history of mental health. Denied history of substance use.  How Long Has This Been Causing You Problems? 1 wk - 1 month (Report depressive symptoms started 81-months.)  Have You Recently Had Any Thoughts About Hurting Yourself? No (currently denied suicidal ideations. Report SI thoughts triggered by depressive symptoms some days.)  Are You Planning to Commit Suicide/Harm Yourself At This time? No  Have you Recently Had Thoughts About [place]? No  Are You Planning To Harm Someone At This Time? No  Are you currently experiencing any auditory, visual or other hallucinations? No  Have You Used Any Alcohol or Drugs in the Past 24 Hours? No  Do you have any current medical co-morbidities that require immediate attention? No  Clinician description of patient physical appearance/behavior: dressed appropriately for the weather  What Do You Feel Would Help You the Most Today? Stress Management  If access to [facility] was not available, would  you have sought care in the Emergency Department? Yes  Determination of Need Routine (7 days)  Options For Referral Other: Comment (continue current treatment)

## 2021-01-19 NOTE — Progress Notes (Signed)
The patient attended group this evening.

## 2021-01-19 NOTE — Progress Notes (Addendum)
Pt was accepted to [facility] 01/19/21 at 1500; Bed Assignment 302-01.  Pt meets inpatient criteria per Ricky Ala  Attending Physician will be Dr.Hill  Report can be called to: - Adult unit: [phone]  Pt can arrive after 1500  Care Team notified via secure chat: [facility] Adalberto Ill, RN, Ricky Ala, NP, Minna Antis, [facility], Drema Halon, RN, and Roxy Manns, Therapist, sports.  Nadara Mode, LCSWA 01/19/2021 @ 12:56 PM

## 2021-01-20 DIAGNOSIS — F332 Major depressive disorder, recurrent severe without psychotic features: Secondary | ICD-10-CM

## 2021-01-20 DIAGNOSIS — F322 Major depressive disorder, single episode, severe without psychotic features: Principal | ICD-10-CM

## 2021-01-20 LAB — GC/CHLAMYDIA PROBE AMP (~~LOC~~) NOT AT ARMC
Chlamydia: NEGATIVE
Comment: NEGATIVE
Comment: NORMAL
Neisseria Gonorrhea: NEGATIVE

## 2021-01-20 LAB — HEMOGLOBIN A1C
Hgb A1c MFr Bld: 5.4 % (ref 4.8–5.6)
Mean Plasma Glucose: 108.28 mg/dL

## 2021-01-20 MED ORDER — TRAZODONE HCL 50 MG PO TABS
50.0000 mg | ORAL_TABLET | Freq: Every evening | ORAL | Status: DC | PRN
  Administered 2021-01-20: 21:00:00 50 mg via ORAL
  Filled 2021-01-20: qty 1

## 2021-01-20 MED ORDER — QUETIAPINE FUMARATE 50 MG PO TABS
50.0000 mg | ORAL_TABLET | Freq: Every day | ORAL | Status: DC
  Administered 2021-01-20: 21:00:00 50 mg via ORAL
  Filled 2021-01-20 (×2): qty 1

## 2021-01-20 MED ORDER — CLONAZEPAM 1 MG PO TABS
1.0000 mg | ORAL_TABLET | Freq: Every day | ORAL | Status: DC
  Administered 2021-01-21 – 2021-01-22 (×2): 1 mg via ORAL
  Filled 2021-01-20 (×2): qty 1

## 2021-01-20 MED ORDER — CLONAZEPAM 0.5 MG PO TABS
0.5000 mg | ORAL_TABLET | Freq: Every day | ORAL | Status: DC
  Administered 2021-01-20 – 2021-01-21 (×2): 0.5 mg via ORAL
  Filled 2021-01-20 (×2): qty 1

## 2021-01-20 MED ORDER — FLUOXETINE HCL 20 MG PO CAPS
60.0000 mg | ORAL_CAPSULE | Freq: Every day | ORAL | Status: DC
  Administered 2021-01-20 – 2021-01-22 (×3): 60 mg via ORAL
  Filled 2021-01-20 (×5): qty 3

## 2021-01-20 MED ORDER — CLONAZEPAM 1 MG PO TABS: 1.0000 mg | ORAL_TABLET | Freq: Every day | ORAL | Status: DC

## 2021-01-20 MED ORDER — CLONAZEPAM 0.5 MG PO TABS: 0.5000 mg | ORAL_TABLET | Freq: Every day | ORAL | Status: DC

## 2021-01-20 NOTE — Final Progress Note (Signed)
[facility] Group Notes:  (Nursing/MHT/Case Management/Adjunct)  Date:  01/20/2021  Time:  2015  Type of Therapy:   wrap up group  Participation Level:  Active  Participation Quality:  Appropriate, Attentive, Sharing, and Supportive  Affect:  Appropriate  Cognitive:  Alert  Insight:  Improving  Engagement in Group:  Engaged  Modes of Intervention:  Clarification, Education, and Support  Summary of Progress/Problems: Positive thinking and positive change were discussed.   Shellia Cleverly 01/20/2021, 8:47 PM

## 2021-01-20 NOTE — H&P (Addendum)
Psychiatric Admission Assessment Adult  Patient Identification: Mathew Moore MRN:  [id number] Date of Evaluation:  01/20/2021 Chief Complaint:  MDD (major depressive disorder) [F32.9] Principal Diagnosis: MDD (major depressive disorder), single episode, severe , no psychosis ([place]) Diagnosis:  Principal Problem:   MDD (major depressive disorder), single episode, severe , no psychosis ([place]) Active Problems:   Psychogenic nonepileptic seizure  HPI: As per HPI from the [facility] where patient initially presented, "Mathew Moore presents to [facility] urgent care voluntary.  He reports worsening depression and passive suicidal ideations.  States multiple stressors that has contributed to his depression lately.  States he is currently unemployment because he has seizure at work and has not been able to drive back and forth to work for the past 10 months.  Reports symptoms of worry related to his Mathew being in the [employer]. Reported financial strains and feels like a burden to family and friends".   Evaluation on unit Patient with moderate amounts of anxiety, but cooperative as he recounted the events leading up to this hospitalization. Pt fairly groomed with fair eye contact, mood depressed, affect congruent and constricted. Pt recounts that 8 months ago, he had what he thought was a seizure at work, and states he works for [employer], on the [employer]. He states that he had multiple evaluations completed both at [facility] hospital and at [facility] Neurology department, and all studies were negative for seizures, but the seizure like episodes became recurrent. As per patient, these episodes led to him not being functional, not working, not being able to provide for his family, and having to depend on his wife who has her oen mental health problems to drive him anywhere that he wanted to go. Pt reports other stressors as being the likelihood of his Mathew being deployed in the  [employer].  Pt reports that as the winter came around, be became more reclusive, not wanting to do activities that he once enjoyed, and began feeling worthless, helpless, hopeless, and would state to his family that he is useless. Pt reports not wanting to wake up most days, and would either lay in bed all day or sit on a chair at home all day with very poor motivation to go on with life. Pt states these feelings of anhedonia have gone on for at least the past 6 to 8 months. Pt denies any substance use, denies any history of physical/emotional or sexual abuse, denies any self injurious behaviors, and denies any past hospitalizations for mental health reasons, and denies any past suicide attempts. Pt reports that he recently began seeing a therapist and psychiatrist outpatient, and reports his only past medical diagnosis as hypertension. Pt reports access to firearms at home, but states that his brother has secured the firearms. Pt admits to recent passive SI but denies current intent or plan and denies AVH/HI, and verbalizes feelings of worthlessness, hopelessness, and helplessness, and is currently a danger to himself. Hospitalization is currently necessary for treatment and mood stability.  Associated Signs/Symptoms: Depression Symptoms:  depressed mood, anhedonia, insomnia, feelings of worthlessness/guilt, difficulty concentrating, hopelessness, recurrent thoughts of death, anxiety, loss of energy/fatigue, disturbed sleep, Duration of Depression Symptoms: Greater than two weeks  (Hypo) Manic Symptoms:  Irritable Mood, Anxiety Symptoms:  Excessive Worry, Psychotic Symptoms:  N/A PTSD Symptoms: NA Total Time spent with patient: 1 hour  Past Psychiatric History: Anxiety, pseudoseizures; Has been working with a therapist since October and has an outpatient psychiatrist  Is the patient  at risk to self? Yes.    Has the patient been a risk to self in the past 6 months? Yes.    Has the patient  been a risk to self within the distant past? No.  Is the patient a risk to others? No.  Has the patient been a risk to others in the past 6 months? No.  Has the patient been a risk to others within the distant past? No.    Alcohol Screening: 1. How often do you have a drink containing alcohol?: Never 2. How many drinks containing alcohol do you have on a typical day when you are drinking?: 1 or 2 3. How often do you have six or more drinks on one occasion?: Never AUDIT-C Score: 0 4. How often during the last year have you found that you were not able to stop drinking once you had started?: Never 5. How often during the last year have you failed to do what was normally expected from you because of drinking?: Never 6. How often during the last year have you needed a first drink in the morning to get yourself going after a heavy drinking session?: Never 7. How often during the last year have you had a feeling of guilt of remorse after drinking?: Never 8. How often during the last year have you been unable to remember what happened the night before because you had been drinking?: Never 9. Have you or someone else been injured as a result of your drinking?: No 10. Has a relative or friend or a doctor or another health worker been concerned about your drinking or suggested you cut down?: No Alcohol Use Disorder Identification Test Final Score (AUDIT): 0 Substance Abuse History in the last 12 months:  No. Consequences of Substance Abuse: NA Previous Psychotropic Medications: Prozac, Lamictal, Klonopin, Ambien ER Psychological Evaluations: Yes (has therapist and psychiatrist)  Past Medical History:  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   Pschogenic nonepileptic, last one 11/28/20   Psychogenic  nonepileptic seizure    Sarcoidosis 07/2019    Past Surgical History:  Procedure Laterality Date   BRONCHOSCOPY     CLEFT LIP REPAIR     COLONOSCOPY     HERNIA REPAIR Left    LEFT HEART CATH AND CORONARY ANGIOGRAPHY N/A 08/23/2018   Procedure: LEFT HEART CATH AND CORONARY ANGIOGRAPHY;  Surgeon: Burnell Blanks, MD;  Location: [facility];  Service: Cardiovascular;  Laterality: N/A;   MEDIASTINOSCOPY N/A 07/20/2019   Procedure: MEDIASTINOSCOPY;  Surgeon: Melrose Nakayama, MD;  Location: [facility];  Service: Thoracic;  Laterality: N/A;   NASAL SEPTOPLASTY W/ TURBINOPLASTY Bilateral 12/03/2020   Procedure: REVISION OF NASAL SEPTOPLASTY AND INFERIOR TURBINATE REDUCTION;  Surgeon: Jerrell Belfast, MD;  Location: [facility];  Service: ENT;  Laterality: Bilateral;   NASAL SEPTUM SURGERY     PLANTAR FASCIA SURGERY Right    SINUS ENDO W/FUSION Bilateral 12/03/2020   Procedure: BILATERAL ENDOSCOPIC SINUS SURGERY WITH NAVIGATION;  Surgeon: Jerrell Belfast, MD;  Location: [facility];  Service: ENT;  Laterality: Bilateral;   VIDEO BRONCHOSCOPY WITH ENDOBRONCHIAL ULTRASOUND N/A 07/06/2019   Procedure: VIDEO BRONCHOSCOPY WITH ENDOBRONCHIAL ULTRASOUND;  Surgeon: Marshell Garfinkel, MD;  Location: [facility];  Service: Pulmonary;  Laterality: N/A;   Family History:  Family  History  Problem Relation Age of Onset   Breast cancer Mother    Heart disease Father    Lung cancer Father    Heart attack Father    Family Psychiatric  History: N/A  Tobacco Screening: N/A    Social History:  Guns secured at home by brother Working on Designer, television/film set with [employer] Social History   Substance and Sexual Activity  Alcohol Use None     Social History   Substance and Sexual Activity  Drug Use Never    Allergies:  No Known Allergies  Lab Results:  Results for orders placed or performed during the hospital encounter of 01/19/21 (from the past 48 hour(s))  Resp Panel by RT-PCR (Flu A&B, Covid) Nasopharyngeal Swab      Status: None   Collection Time: 01/19/21 12:04 PM   Specimen: Nasopharyngeal Swab; Nasopharyngeal(NP) swabs in vial transport medium  Result Value Ref Range   SARS Coronavirus 2 by RT PCR NEGATIVE NEGATIVE    Comment: (NOTE) SARS-CoV-2 target nucleic acids are NOT DETECTED.  The SARS-CoV-2 RNA is generally detectable in upper respiratory specimens during the acute phase of infection. The lowest concentration of SARS-CoV-2 viral copies this assay can detect is 138 copies/mL. A negative result does not preclude SARS-Cov-2 infection and should not be used as the sole basis for treatment or other patient management decisions. A negative result may occur with  improper specimen collection/handling, submission of specimen other than nasopharyngeal swab, presence of viral mutation(s) within the areas targeted by this assay, and inadequate number of viral copies(<138 copies/mL). A negative result must be combined with clinical observations, patient history, and epidemiological information. The expected result is Negative.  Fact Sheet for Patients:  EntrepreneurPulse.com.au  Fact Sheet for Healthcare Providers:  IncredibleEmployment.be  This test is no t yet approved or cleared by the [place] FDA and  has been authorized for detection and/or diagnosis of SARS-CoV-2 by FDA under an Emergency Use Authorization (EUA). This EUA will remain  in effect (meaning this test can be used) for the duration of the COVID-19 declaration under Section 564(b)(1) of the Act, 21 U.S.C.section 360bbb-3(b)(1), unless the authorization is terminated  or revoked sooner.       Influenza A by PCR NEGATIVE NEGATIVE   Influenza B by PCR NEGATIVE NEGATIVE    Comment: (NOTE) The Xpert Xpress SARS-CoV-2/FLU/RSV plus assay is intended as an aid in the diagnosis of influenza from Nasopharyngeal swab specimens and should not be used as a sole basis for treatment. Nasal  washings and aspirates are unacceptable for Xpert Xpress SARS-CoV-2/FLU/RSV testing.  Fact Sheet for Patients: EntrepreneurPulse.com.au  Fact Sheet for Healthcare Providers: IncredibleEmployment.be  This test is not yet approved or cleared by the [place] FDA and has been authorized for detection and/or diagnosis of SARS-CoV-2 by FDA under an Emergency Use Authorization (EUA). This EUA will remain in effect (meaning this test can be used) for the duration of the COVID-19 declaration under Section 564(b)(1) of the Act, 21 U.S.C. section 360bbb-3(b)(1), unless the authorization is terminated or revoked.  Performed at [facility], [place] [address] [place], [place] [address]   POC SARS Coronavirus 2 Ag-ED - Nasal Swab     Status: Normal   Collection Time: 01/19/21 12:04 PM  Result Value Ref Range   SARS Coronavirus 2 Ag Negative Negative  CBC with Differential/Platelet     Status: Abnormal   Collection Time: 01/19/21 12:39 PM  Result Value Ref Range   WBC 6.5  4.0 - 10.5 K/uL   RBC 5.23 4.22 - 5.81 MIL/uL   Hemoglobin 15.0 13.0 - 17.0 g/dL   HCT 45.4 39.0 - 52.0 %   MCV 86.8 80.0 - 100.0 fL   MCH 28.7 26.0 - 34.0 pg   MCHC 33.0 30.0 - 36.0 g/dL   RDW 13.3 11.5 - 15.5 %   Platelets 406 (H) 150 - 400 K/uL   nRBC 0.0 0.0 - 0.2 %   Neutrophils Relative % 60 %   Neutro Abs 3.9 1.7 - 7.7 K/uL   Lymphocytes Relative 33 %   Lymphs Abs 2.2 0.7 - 4.0 K/uL   Monocytes Relative 4 %   Monocytes Absolute 0.3 0.1 - 1.0 K/uL   Eosinophils Relative 2 %   Eosinophils Absolute 0.1 0.0 - 0.5 K/uL   Basophils Relative 1 %   Basophils Absolute 0.1 0.0 - 0.1 K/uL   Immature Granulocytes 0 %   Abs Immature Granulocytes 0.02 0.00 - 0.07 K/uL    Comment: Performed at [facility] [address], [place], [place] [address]  Comprehensive metabolic panel     Status: None   Collection Time: 01/19/21 12:39 PM  Result Value Ref Range   Sodium 141  135 - 145 mmol/L   Potassium 4.5 3.5 - 5.1 mmol/L   Chloride 105 98 - 111 mmol/L   CO2 28 22 - 32 mmol/L   Glucose, Bld 99 70 - 99 mg/dL    Comment: Glucose reference range applies only to samples taken after fasting for at least 8 hours.   BUN 11 6 - 20 mg/dL   Creatinine, Ser 1.12 0.61 - 1.24 mg/dL   Calcium 9.5 8.9 - 10.3 mg/dL   Total Protein 7.3 6.5 - 8.1 g/dL   Albumin 4.4 3.5 - 5.0 g/dL   AST 22 15 - 41 U/L   ALT 37 0 - 44 U/L   Alkaline Phosphatase 95 38 - 126 U/L   Total Bilirubin 0.5 0.3 - 1.2 mg/dL   GFR, Estimated >60 >60 mL/min    Comment: (NOTE) Calculated using the CKD-EPI Creatinine Equation (2021)    Anion gap 8 5 - 15    Comment: Performed at [facility] [address] [place], [place] [address]  Lipid panel     Status: Abnormal   Collection Time: 01/19/21 12:39 PM  Result Value Ref Range   Cholesterol 136 0 - 200 mg/dL   Triglycerides 89 <150 mg/dL   HDL 37 (L) >40 mg/dL   Total CHOL/HDL Ratio 3.7 RATIO   VLDL 18 0 - 40 mg/dL   LDL Cholesterol 81 0 - 99 mg/dL    Comment:        Total Cholesterol/HDL:CHD Risk Coronary Heart Disease Risk Table                     Men   Women  1/2 Average Risk   3.4   3.3  Average Risk       5.0   4.4  2 X Average Risk   9.6   7.1  3 X Average Risk  23.4   11.0        Use the calculated Patient Ratio above and the CHD Risk Table to determine the patient's CHD Risk.        ATP III CLASSIFICATION (LDL):  <100     mg/dL   Optimal  100-129  mg/dL   Near or Above  Optimal  130-159  mg/dL   Borderline  160-189  mg/dL   High  >190     mg/dL   Very High Performed at [facility] [address], [place], [place] [address]   POC SARS Coronavirus 2 Ag     Status: None   Collection Time: 01/19/21 12:39 PM  Result Value Ref Range   SARSCOV2ONAVIRUS 2 AG NEGATIVE NEGATIVE    Comment: (NOTE) SARS-CoV-2 antigen NOT DETECTED.   Negative results are presumptive.  Negative results do not  preclude SARS-CoV-2 infection and should not be used as the sole basis for treatment or other patient management decisions, including infection  control decisions, particularly in the presence of clinical signs and  symptoms consistent with COVID-19, or in those who have been in contact with the virus.  Negative results must be combined with clinical observations, patient history, and epidemiological information. The expected result is Negative.  Fact Sheet for Patients: HandmadeRecipes.com.cy  Fact Sheet for Healthcare Providers: FuneralLife.at  This test is not yet approved or cleared by the [place] FDA and  has been authorized for detection and/or diagnosis of SARS-CoV-2 by FDA under an Emergency Use Authorization (EUA).  This EUA will remain in effect (meaning this test can be used) for the duration of  the COV ID-19 declaration under Section 564(b)(1) of the Act, 21 U.S.C. section 360bbb-3(b)(1), unless the authorization is terminated or revoked sooner.    TSH     Status: None   Collection Time: 01/19/21 12:41 PM  Result Value Ref Range   TSH 2.275 0.350 - 4.500 uIU/mL    Comment: Performed by a 3rd Generation assay with a functional sensitivity of <=0.01 uIU/mL. Performed at [facility], [place] [address], [place], [place] [address]   POCT Urine Drug Screen - ([facility])     Status: Abnormal   Collection Time: 01/19/21  1:24 PM  Result Value Ref Range   POC Amphetamine UR None Detected NONE DETECTED (Cut Off Level 1000 ng/mL)   POC Secobarbital (BAR) None Detected NONE DETECTED (Cut Off Level 300 ng/mL)   POC Buprenorphine (BUP) None Detected NONE DETECTED (Cut Off Level 10 ng/mL)   POC Oxazepam (BZO) Positive (A) NONE DETECTED (Cut Off Level 300 ng/mL)   POC Cocaine UR None Detected NONE DETECTED (Cut Off Level 300 ng/mL)   POC Methamphetamine UR None Detected NONE DETECTED (Cut Off Level 1000 ng/mL)   POC Morphine None  Detected NONE DETECTED (Cut Off Level 300 ng/mL)   POC Oxycodone UR None Detected NONE DETECTED (Cut Off Level 100 ng/mL)   POC Methadone UR None Detected NONE DETECTED (Cut Off Level 300 ng/mL)   POC Marijuana UR None Detected NONE DETECTED (Cut Off Level 50 ng/mL)  GC/Chlamydia probe amp ([place])not at [facility]     Status: None   Collection Time: 01/19/21  1:25 PM  Result Value Ref Range   Neisseria Gonorrhea Negative    Chlamydia Negative    Comment Normal Reference [place] Chlamydia - Negative    Comment      Normal Reference Range Neisseria Gonorrhea - Negative    Blood Alcohol level:  Lab Results  Component Value Date   ETH <10 08/17/2020   ETH <10 63/14/9702    Metabolic Disorder Labs:  No results found for: HGBA1C, MPG No results found for: PROLACTIN Lab Results  Component Value Date   CHOL 136 01/19/2021   TRIG 89 01/19/2021   HDL 37 (L) 01/19/2021  CHOLHDL 3.7 01/19/2021   VLDL 18 01/19/2021   LDLCALC 81 01/19/2021    Current Medications: Current Facility-Administered Medications  Medication Dose Route Frequency Provider Last Rate Last Admin   acetaminophen (TYLENOL) tablet 650 mg  650 mg Oral Q6H PRN Derrill Center, NP       alum & mag hydroxide-simeth (MAALOX/MYLANTA) 200-200-20 MG/5ML suspension 30 mL  30 mL Oral Q4H PRN Derrill Center, NP       atorvastatin (LIPITOR) tablet 40 mg  40 mg Oral Daily Derrill Center, NP   40 mg at 01/20/21 0747   clonazePAM (KLONOPIN) tablet 0.5 mg  0.5 mg Oral Daily Nicholes Rough, NP   0.5 mg at 01/20/21 1822   [START ON 01/21/2021] clonazePAM (KLONOPIN) tablet 1 mg  1 mg Oral Daily Nkwenti, Tamela Oddi, NP       FLUoxetine (PROZAC) capsule 60 mg  60 mg Oral Daily Nicholes Rough, NP   60 mg at 01/20/21 1536   lamoTRIgine (LAMICTAL) tablet 150 mg  150 mg Oral QHS Derrill Center, NP   150 mg at 01/19/21 2126   losartan (COZAAR) tablet 25 mg  25 mg Oral Daily Derrill Center, NP   25 mg at 01/20/21 0747   magnesium hydroxide (MILK  OF MAGNESIA) suspension 30 mL  30 mL Oral Daily PRN Derrill Center, NP       QUEtiapine (SEROQUEL) tablet 50 mg  50 mg Oral QHS Nkwenti, Doris, NP       traZODone (DESYREL) tablet 50 mg  50 mg Oral QHS PRN Nicholes Rough, NP        Musculoskeletal: Strength & Muscle Tone: within normal limits Gait & Station: normal Patient leans: N/A  Psychiatric Specialty Exam:  Presentation  General Appearance: Appropriate for Environment; Fairly Groomed  Eye Contact:Fair  Speech:Clear and Coherent  Speech Volume:Normal  Handedness:Right  Mood and Affect  Mood:Anxious; Depressed  Affect:constricted  Thought Process  Thought Processes:coherent, goal directed  Duration of Psychotic Symptoms: NA Past Diagnosis of Schizophrenia or Psychoactive disorder: No  Descriptions of Associations:Intact  Orientation:Full (Time, Place and Person)  Thought Content:Logical  Hallucinations:Hallucinations: None  Ideas of Reference:None  Suicidal Thoughts:Had passive SI but denies current intent or plan  Homicidal Thoughts:Denied  Sensorium  Memory:Immediate Good  Judgment:Fair  Insight:Fair  Executive Functions  Concentration:Fair Attention Span:Fair [place] of Knowledge:Good Language:Good  Psychomotor Activity  Psychomotor Activity:Psychomotor Activity: Normal   Assets  Assets:Communication Skills  Sleep  Sleep:Sleep: Fair   Physical Exam HENT:     Head: Normocephalic.     Nose: Nose normal. No congestion.     Mouth/Throat:     Pharynx: No oropharyngeal exudate.  Cardiovascular:     Heart sounds: No murmur heard. Pulmonary:     Effort: No respiratory distress.  Abdominal:     General: There is no distension.     Tenderness: There is no abdominal tenderness.  Musculoskeletal:        General: Normal range of motion.     Cervical back: Normal range of motion. No rigidity.  Neurological:     Mental Status: He is alert and oriented to person, place, and  time.     Motor: No weakness.  Psychiatric:        Thought Content: Thought content normal.   Review of Systems  Constitutional: Negative.  Negative for fever.  HENT: Negative.  Negative for congestion and sore throat.   Eyes: Negative.   Respiratory: Negative.  Negative for cough  and shortness of breath.   Cardiovascular: Negative.  Negative for chest pain.  Gastrointestinal: Negative.  Negative for constipation, nausea and vomiting.  Genitourinary: Negative.  Negative for frequency.  Musculoskeletal: Negative.  Negative for back pain and joint pain.  Skin: Negative.  Negative for itching and rash.  Neurological: Negative.  Negative for dizziness, tingling, tremors and sensory change.  Psychiatric/Behavioral:  Positive for depression. Negative for hallucinations, substance abuse and suicidal ideas. The patient is nervous/anxious and has insomnia.   Blood pressure 124/77, pulse 64, temperature 97.7 F (36.5 C), temperature source Oral, resp. rate 16, height 5' 8.5" (1.74 m), weight 95.2 kg, SpO2 99 %. Body mass index is 31.43 kg/m.  Treatment Plan Summary: Daily contact with patient to assess and evaluate symptoms and progress in treatment and Medication management   Observation Level/Precautions:  Q15 minute checks   Laboratory:  Labs reviewed, Hemoglobin A1C and repeat EKG ordered  Psychotherapy:  Unit Group sessions  Medications:  See [facility]  Consultations:  To be determined   Discharge Concerns:  Safety, medication compliance, mood stability  Estimated LOS: 5-7 days  Other:  N/A   Plan: Safety and Monitoring: Voluntary admission to inpatient psychiatric unit for safety, stabilization and treatment Daily contact with patient to assess and evaluate symptoms and progress in treatment Patient's case to be discussed in multi-disciplinary team meeting Observation Level : q15 minute checks Vital signs: q12 hours Precautions: suicide, but pt currently verbally contracts for safety  on unit   MDD Severe, without psychotic features -Continue Prozac 60 mg daily  -Start Seroquel 50 mg nightly at bedtime to help with anxiety and sleep and to augment for residual depression   Anxiety -Continue Klonopin 1 mg daily in the morning and 0.5 mg every evening (dose verified in PDMP)   Insomnia -Start Trazodone 50 mg nightly PRN and stop Ambien  Hypertension -Continue Losartan 25 mg daily  Hyperlipidemia -Continue Lipitor 40 mg daily   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  daily at bedtime  Other PRN Medications -Acetaminophen 650 mg every 6 as needed/mild pain -Maalox 30 mL oral every 4 as needed/digestion -Magnesium hydroxide 30 mL daily as needed/mild constipation   Discharge Planning: Social work and case management to assist with discharge planning and identification of hospital follow-up needs prior to discharge Estimated LOS: 5-7 days Discharge Concerns: Need to establish a safety plan; Medication compliance and effectiveness Discharge Goals: Return home with outpatient referrals for mental health follow-up including medication management/psychotherapy.  Nicholes Rough, NP 72/62/0355, 9:74 PM   I certify that inpatient services furnished can reasonably be expected to improve the patient's condition.

## 2021-01-20 NOTE — Group Note (Signed)
Recreation Therapy Group Note   Group Topic:Animal Assisted Therapy   Group Date: 01/20/2021 Start Time: 1430 End Time: 1510 Facilitators: Victorino Sparrow, LRT,CTRS Location: [place]   AAA/T Program Assumption of Risk Form signed by Patient/ or Parent Legal Guardian Yes  Patient is free of allergies or severe asthma Yes  Patient reports no fear of animals Yes  Patient reports no history of cruelty to animals Yes  Patient understands his/her participation is voluntary Yes  Patient washes hands before animal contact Yes  Patient washes hands after animal contact Yes   Affect/Mood: Appropriate   Participation Level: Engaged   Participation Quality: Independent   Behavior: Appropriate   Speech/Thought Process: Focused   Insight: Good   Judgement: Good   Modes of Intervention: Nurse, adult   Patient Response to Interventions:  Attentive   Education Outcome:  Acknowledges education and In group clarification offered    Clinical Observations/Individualized Feedback: Patient attended session and interacted appropriately with therapy dog and peers. Patient asked appropriate questions about therapy dog and his training. Patient shared stories about their pets at home with group.    Plan: Continue to engage patient in RT group sessions 2-3x/week.   Victorino Sparrow, Glennis Brink 01/20/2021 3:47 PM

## 2021-01-20 NOTE — Progress Notes (Signed)
Pt is cooperative. Pt verbalized being sad from financial burden of losing job due to unexplained seizures. Pt verbalized passive SI, but contracts for safety. Pt denies HI. Denies AVH. Pt requested PRN Ambien for sleep. Pt states he always uses med to go to sleep and has difficulty sleeping without it. Pt seen interacting positively with peers. Denies any other complaints.    01/19/21 2125  Psych Admission Type (Psych Patients Only)  Admission Status Voluntary  Psychosocial Assessment  Patient Complaints Self-harm thoughts;Sadness  Eye Contact Fair  Facial Expression Flat  Affect Sad  Speech Soft  Interaction Assertive  Motor Activity Other (Comment) (WNL)  Appearance/Hygiene Unremarkable  Behavior Characteristics Cooperative;Appropriate to situation  Mood Pleasant  Thought Process  Coherency Circumstantial  Content Blaming self  Delusions None reported or observed  Perception WDL  Hallucination None reported or observed  Judgment Limited  Confusion None  Danger to Self  Current suicidal ideation? Passive  Self-Injurious Behavior No self-injurious ideation or behavior indicators observed or expressed   Agreement Not to Harm Self Yes  Description of Agreement Verbal Contracts  Danger to Others  Danger to Others None reported or observed

## 2021-01-20 NOTE — Progress Notes (Signed)
°  On assessment, pt presents with moderate anxiety.  Pt reports, "possible discharge on Thursday."  Pt denies SI/HI and verbally contracts for safety.  Pt denies AVH.  Medication administered.  Pt is safe on unit with Q 15 minute safety checks.  Will continue to monitor.    01/20/21 2120  Psych Admission Type (Psych Patients Only)  Admission Status Voluntary  Psychosocial Assessment  Patient Complaints Anxiety  Eye Contact Fair  Facial Expression Animated;Anxious  Affect Anxious  Speech Soft  Interaction Assertive  Motor Activity Other (Comment) (Steady)  Appearance/Hygiene Unremarkable  Behavior Characteristics Cooperative;Anxious  Mood Pleasant;Anxious  Thought Process  Coherency WDL  Content Blaming self  Delusions None reported or observed  Perception WDL  Hallucination None reported or observed  Judgment Limited  Confusion None  Danger to Self  Current suicidal ideation? Denies  Self-Injurious Behavior No self-injurious ideation or behavior indicators observed or expressed   Agreement Not to Harm Self Yes  Description of Agreement verbal contract for safety  Danger to Others  Danger to Others None reported or observed

## 2021-01-20 NOTE — [facility] Suicide Risk Assessment (Signed)
Suicide Risk Assessment  Admission Assessment    [facility] Admission Suicide Risk Assessment   Nursing information obtained from:  Patient Demographic factors:  Male Current Mental Status:  NA Loss Factors:  Decline in physical health Historical Factors:  Family history of mental illness or substance abuse, Anniversary of important loss Risk Reduction Factors:  Sense of responsibility to family, Living with another person, especially a relative, Positive social support  Total Time spent with patient: 1 hour Principal Problem: MDD (major depressive disorder) Diagnosis:  Principal Problem:   MDD (major depressive disorder)  HPI: As per HPI from the [facility] where patient initially presented, "Mathew Moore presents to [facility] urgent care voluntary.  He reports worsening depression and passive suicidal ideations.  States multiple stressors that has contributed to his depression lately.  States he is currently unemployment because he has seizure at work and has not been able to drive back and forth to work for the past 10 months.  Reports symptoms of worry related to his son being in the [employer]. Reported financial strains and feels like a burden to family and friends".   Evaluation on unit: Pt reports that as the winter came around, be became more reclusive, not wanting to do activities that he once enjoyed, and began feeling worthless, helpless, hopeless, and would state to his family that he is useless. Pt reports not wanting to wake up most days, and would either lay in bed all day or sit on a chair at home all day with very poor motivation to go on with life. Pt states these feelings of anhedonia have gone on for at least the past 6 to 8 months. Pt denies any substance use, denies any history of physical/emotional or sexual abuse, denies any self injurious behaviors, and denies any past hospitalizations for mental health reasons, and denies any past suicide attempts. Pt reports that he recently  began seeing a therapist outpatient, and reports his only past medical diagnosis as hypertension. Pt reports access to firearms at home, but states that his brother has secured the firearms. Pt continues to verbalize passive SI, denies AVH/HI, and verbalizes feelings of worthlessness, hopelessness, and helplessness, and is currently a danger to himself. Hospitalization is currently necessary for treatment and mood stability.  Continued Clinical Symptoms:  Alcohol Use Disorder Identification Test Final Score (AUDIT): 0 The "Alcohol Use Disorders Identification Test", Guidelines for Use in Primary Care, Second Edition.  World [employer] [facility]). Score between 0-7:  no or low risk or alcohol related problems. Score between 8-15:  moderate risk of alcohol related problems. Score between 16-19:  high risk of alcohol related problems. Score 20 or above:  warrants further diagnostic evaluation for alcohol dependence and treatment.  CLINICAL FACTORS:   Depression:   Anhedonia Hopelessness Impulsivity Insomnia   Musculoskeletal: Strength & Muscle Tone: within normal limits Gait & Station: normal Patient leans: N/A  Psychiatric Specialty Exam:  Presentation  General Appearance: Appropriate for Environment; Fairly Groomed  Eye Contact:Fair  Speech:Clear and Coherent  Speech Volume:Normal  Handedness:Right   Mood and Affect  Mood:Anxious; Depressed  Affect:Appropriate; Depressed   Thought Process  Thought Processes:Coherent  Descriptions of Associations:Intact  Orientation:Full (Time, Place and Person)  Thought Content:Logical  History of Schizophrenia/Schizoaffective disorder:No  Duration of Psychotic Symptoms:No data recorded Hallucinations:Hallucinations: None  Ideas of Reference:None  Suicidal Thoughts:Suicidal Thoughts: No SI Passive Intent and/or Plan: Without Intent; Without Means to Carry Out  Homicidal Thoughts:Homicidal Thoughts: No   Sensorium   Memory:Immediate Good  Judgment:Fair  Insight:Fair   Executive Functions  Concentration:Fair  Attention Span:Fair  Recall:Good  Fund of Knowledge:Good  Language:Good  Psychomotor Activity  Psychomotor Activity:Psychomotor Activity: Normal  Assets  Assets:Communication Skills  Sleep  Sleep:Sleep: Fair   Physical Exam: Physical Exam HENT:     Head: Normocephalic.     Nose: No congestion or rhinorrhea.  Eyes:     Pupils: Pupils are equal, round, and reactive to light.  Cardiovascular:     Heart sounds: No murmur heard. Pulmonary:     Effort: No respiratory distress.  Abdominal:     General: There is no distension.     Palpations: There is no mass.  Musculoskeletal:     Cervical back: Rigidity present.  Skin:    Coloration: Skin is not jaundiced.  Neurological:     General: No focal deficit present.     Mental Status: He is alert and oriented to person, place, and time.     Sensory: No sensory deficit.     Coordination: Coordination normal.  Psychiatric:        Thought Content: Thought content normal.   Review of Systems  Constitutional: Negative.  Negative for fever and malaise/fatigue.  HENT: Negative.  Negative for congestion and sore throat.   Respiratory: Negative.  Negative for cough and shortness of breath.   Cardiovascular: Negative.  Negative for chest pain.  Gastrointestinal: Negative.  Negative for abdominal pain, constipation, diarrhea, heartburn, nausea and vomiting.  Genitourinary:  Negative for frequency and urgency.  Musculoskeletal: Negative.  Negative for myalgias.  Skin:  Negative for itching and rash.  Neurological:  Negative for dizziness, tingling, tremors and headaches.  Psychiatric/Behavioral:  Positive for depression and suicidal ideas. Negative for hallucinations and substance abuse. The patient is nervous/anxious and has insomnia.   Blood pressure 124/77, pulse 64, temperature 97.7 F (36.5 C), temperature source Oral, resp.  rate 16, height 5' 8.5" (1.74 m), weight 95.2 kg, SpO2 99 %. Body mass index is 31.43 kg/m.   COGNITIVE FEATURES THAT CONTRIBUTE TO RISK:  None    SUICIDE RISK:   Severe:  Frequent, intense, and enduring suicidal ideation, specific plan, no subjective intent, but some objective markers of intent (i.e., choice of lethal method), the method is accessible, some limited preparatory behavior, evidence of impaired self-control, severe dysphoria/symptomatology, multiple risk factors present, and few if any protective factors, particularly a lack of social support.  Treatment Plan Summary: Daily contact with patient to assess and evaluate symptoms and progress in treatment and Medication management     Observation Level/Precautions:  Q15 minute checks   Laboratory:  Labs reviewed, Hemoglobin A1C and repeat EKG ordered  Psychotherapy:  Unit Group sessions  Medications:  See [facility]  Consultations:  To be determined   Discharge Concerns:  Safety, medication compliance, mood stability  Estimated LOS: 5-7 days  Other:  N/A    Plan: Safety and Monitoring: Voluntary admission to inpatient psychiatric unit for safety, stabilization and treatment Daily contact with patient to assess and evaluate symptoms and progress in treatment Patient's case to be discussed in multi-disciplinary team meeting Observation Level : q15 minute checks Vital signs: q12 hours Precautions: suicide, but pt currently verbally contracts for safety on unit    MDD Severe, Recurrent  -Continue Prozac 60 mg daily  -Start Seroquel 50 mg nightly at bedtime   Anxiety -Continue Klonopin 1 mg daily in the morning and 0.5 mg every evening    Insomnia -Start Trazodone 50 mg nightly PRN   Hypertension -  Continue Losartan 25 mg daily   Hyperlipidemia -Continue Lipitor 40 mg daily    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  daily at bedtime   Other PRN Medications -Acetaminophen 650 mg every 6 as needed/mild  pain -Maalox 30 mL oral every 4 as needed/digestion -Magnesium hydroxide 30 mL daily as needed/mild constipation   Discharge Planning: Social work and case management to assist with discharge planning and identification of hospital follow-up needs prior to discharge Estimated LOS: 5-7 days Discharge Concerns: Need to establish a safety plan; Medication compliance and effectiveness Discharge Goals: Return home with outpatient referrals for mental health follow-up including medication management/psychotherapy.   I certify that inpatient services furnished can reasonably be expected to improve the patient's condition.   Nicholes Rough, NP 01/20/2021, 6:47 PM

## 2021-01-20 NOTE — Progress Notes (Signed)
°   01/20/21 1115  Psych Admission Type (Psych Patients Only)  Admission Status Voluntary  Psychosocial Assessment  Patient Complaints Depression;Anxiety  Eye Contact Fair  Facial Expression Flat  Affect Sad  Speech Soft  Interaction Assertive  Motor Activity Other (Comment) (WNL)  Appearance/Hygiene Unremarkable  Behavior Characteristics Cooperative  Mood Pleasant  Thought Process  Coherency WDL  Content Blaming self  Delusions None reported or observed  Perception WDL  Hallucination None reported or observed  Judgment Limited  Confusion None  Danger to Self  Current suicidal ideation? Denies  Self-Injurious Behavior No self-injurious ideation or behavior indicators observed or expressed   Agreement Not to Harm Self Yes  Description of Agreement Verbal Contracts  Danger to Others  Danger to Others None reported or observed   Pt denied SI/HI/AVH.  Pt described his mood as "mellow for the most part, but I've dealt with anxiety for a long time, so I am somewhat anxious today."  Pt was talkative with other patients and attended groups.  RN provided support and assessed for needs and concerns.  Pt administered medications per provider orders without incident.

## 2021-01-20 NOTE — Plan of Care (Signed)
°  Problem: Medication: Goal: Compliance with prescribed medication regimen will improve Outcome: Progressing   Problem: Education: Goal: Knowledge of the prescribed therapeutic regimen will improve Outcome: Progressing   Problem: Coping: Goal: Coping ability will improve Outcome: Not Progressing

## 2021-01-21 ENCOUNTER — Encounter (HOSPITAL_COMMUNITY): Payer: Self-pay

## 2021-01-21 MED ORDER — QUETIAPINE FUMARATE 50 MG PO TABS
50.0000 mg | ORAL_TABLET | Freq: Every day | ORAL | Status: DC
  Administered 2021-01-21: 22:00:00 50 mg via ORAL
  Filled 2021-01-21 (×3): qty 1

## 2021-01-21 NOTE — Progress Notes (Signed)
[facility] MD Progress Note  01/21/2021 3:30 PM Mathew Moore  MRN:  [id number]  Subjective: Mathew Moore reports, "I'm feeling some better & tired today. But my mood is improving some. I know when I was at home, I was on Ambien for sleep. But here I'm not on Ambien. The medicine I take at the night is helping, but what if it stops when I get home, then what? I think I'm feeling better because I'm away from my stressors, attending group sessions here & taking medications. I'm wondering what happens when I get home. I felt drowsy this morning upon waking up". Daily notes 01/21/21: Mathew Moore is seen, chart reviewed. The chart findings discussed with the treatment team. He presents alert, oriented & aware of situation. He is visible on the unit, attending group sessions. He presents with an improved affect, good eye contact & verbally responsive. He reports feeling some better & tired today. Says his mood is improving some. Reports when he was home, was on Ambien for sleep. Patient is wondering what happens if his gets home & his current medications could not help him sleep like Ambien did. Patient is instructed & encouraged to continue on his current medications as recommended as we continue to monitor effectiveness & possible side effects. This provider used this opportunity to explain each medication in use & possible side effects. He is assured that upon discharge will be recommended & scheduled an outpatient with a psychiatric provider who will continue to provide psychiatric follow-up care, medication management & monitoring the effects/effectiveness of the medications in use. Patient currently denies any SIHI, AVH, delusional thoughts or paranoia. He does not appear to be responding to any internal stimuli. There are no changes made on the current plan of care as already in progress.   Reason for admission: Mathew Moore is a 55 year old Caucasian male presents to [facility] urgent care voluntary.  He reports  worsening depression and passive suicidal ideations.  States multiple stressors that has contributed to his depression lately.  States he is currently unemployment because he has seizure at work and has not been able to drive back and forth to work for the past 10 months.  Reports symptoms of worry related to his son being in the [employer]. Reported financial strains and feels like a burden to family and friends.   Principal Problem: MDD (major depressive disorder), single episode, severe , no psychosis ([place])  Diagnosis: Principal Problem:   MDD (major depressive disorder), single episode, severe , no psychosis ([place]) Active Problems:   Psychogenic nonepileptic seizure  Total Time spent with patient:  35 minutes  Past Psychiatric History: See H&P  Past Medical History:  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   Pschogenic nonepileptic, last one 11/28/20   Psychogenic nonepileptic seizure    Sarcoidosis 07/2019    Past Surgical History:  Procedure Laterality Date   BRONCHOSCOPY     CLEFT LIP REPAIR     COLONOSCOPY     HERNIA REPAIR Left    LEFT HEART CATH AND CORONARY ANGIOGRAPHY N/A 08/23/2018   Procedure: LEFT HEART CATH AND CORONARY ANGIOGRAPHY;  Surgeon: Burnell Blanks, MD;  Location: [place] [facility];  Service: Cardiovascular;  Laterality: N/A;   MEDIASTINOSCOPY N/A 07/20/2019   Procedure: MEDIASTINOSCOPY;  Surgeon: Melrose Nakayama, MD;  Location: [facility] OR;  Service: Thoracic;  Laterality: N/A;   NASAL SEPTOPLASTY W/ TURBINOPLASTY Bilateral 12/03/2020   Procedure: REVISION OF NASAL SEPTOPLASTY AND INFERIOR TURBINATE REDUCTION;  Surgeon: Jerrell Belfast, MD;  Location: [place];  Service: ENT;  Laterality: Bilateral;   NASAL SEPTUM SURGERY     PLANTAR FASCIA SURGERY Right    SINUS  ENDO W/FUSION Bilateral 12/03/2020   Procedure: BILATERAL ENDOSCOPIC SINUS SURGERY WITH NAVIGATION;  Surgeon: Jerrell Belfast, MD;  Location: [place];  Service: ENT;  Laterality: Bilateral;   VIDEO BRONCHOSCOPY WITH ENDOBRONCHIAL ULTRASOUND N/A 07/06/2019   Procedure: VIDEO BRONCHOSCOPY WITH ENDOBRONCHIAL ULTRASOUND;  Surgeon: Marshell Garfinkel, MD;  Location: [place];  Service: Pulmonary;  Laterality: N/A;   Family History:  Family History  Problem Relation Age of Onset   Breast cancer Mother    Heart disease Father    Lung cancer Father    Heart attack Father    Family Psychiatric  History: See H&P  Social History:  Social History   Substance and Sexual Activity  Alcohol Use None     Social History   Substance and Sexual Activity  Drug Use Never    Social History   Socioeconomic History   Marital status: Married    Spouse name: Teacher, music   Number of children: 2   Years of education: Not on file   Highest education level: Not on file  Occupational History   Not on file  Tobacco Use   Smoking status: Never   Smokeless tobacco: Never  Vaping Use   Vaping Use: Never used  Substance and Sexual Activity   Alcohol use: Not on file   Drug use: Never   Sexual activity: Yes    Birth control/protection: None  Other Topics Concern   Not on file  Social History Narrative   Lives with wife   Social Determinants of Health   Financial Resource Strain: Not on file  Food Insecurity: Not on file  Transportation Needs: Not on file  Physical Activity: Not on file  Stress: Not on file  Social Connections: Not on file   Additional Social History:   Sleep: Good  Appetite:  Good  Current Medications: Current Facility-Administered Medications  Medication Dose Route Frequency Provider Last Rate Last Admin   acetaminophen (TYLENOL) tablet 650 mg  650 mg Oral Q6H PRN Derrill Center, NP       alum & mag hydroxide-simeth (MAALOX/MYLANTA) 200-200-20 MG/5ML suspension 30 mL  30 mL Oral  Q4H PRN Derrill Center, NP       atorvastatin (LIPITOR) tablet 40 mg  40 mg Oral Daily Derrill Center, NP   40 mg at 01/21/21 0755   clonazePAM (KLONOPIN) tablet 0.5 mg  0.5 mg Oral Daily Nkwenti, Doris, NP   0.5 mg at 01/20/21 1822   clonazePAM (KLONOPIN) tablet 1 mg  1 mg Oral Daily Nkwenti, Doris, NP   1 mg at 01/21/21 1000   FLUoxetine (PROZAC) capsule 60 mg  60 mg Oral Daily Nkwenti, Tamela Oddi, NP   60 mg at 01/21/21 0755   lamoTRIgine (LAMICTAL) tablet 150 mg  150 mg Oral QHS Derrill Center, NP   150 mg at 01/20/21 2120   losartan (COZAAR) tablet 25 mg  25 mg Oral Daily Derrill Center, NP   25 mg at 01/21/21 0755   magnesium hydroxide (MILK OF MAGNESIA) suspension 30 mL  30 mL Oral Daily PRN Derrill Center, NP  QUEtiapine (SEROQUEL) tablet 50 mg  50 mg Oral QHS Macie Baum I, NP       traZODone (DESYREL) tablet 50 mg  50 mg Oral QHS PRN Nicholes Rough, NP   50 mg at 01/20/21 2119   Lab Results:  Results for orders placed or performed during the hospital encounter of 01/19/21 (from the past 48 hour(s))  Hemoglobin A1c     Status: None   Collection Time: 01/20/21  6:26 PM  Result Value Ref Range   Hgb A1c MFr Bld 5.4 4.8 - 5.6 %    Comment: (NOTE) Pre diabetes:          5.7%-6.4%  Diabetes:              >6.4%  Glycemic control for   <7.0% adults with diabetes    Mean Plasma Glucose 108.28 mg/dL    Comment: Performed at [facility], [place] [address] [place], [place] [address]   Blood Alcohol level:  Lab Results  Component Value Date   ETH <10 08/17/2020   ETH <10 60/45/4098   Metabolic Disorder Labs: Lab Results  Component Value Date   HGBA1C 5.4 01/20/2021   MPG 108.28 01/20/2021   No results found for: PROLACTIN Lab Results  Component Value Date   CHOL 136 01/19/2021   TRIG 89 01/19/2021   HDL 37 (L) 01/19/2021   CHOLHDL 3.7 01/19/2021   VLDL 18 01/19/2021   LDLCALC 81 01/19/2021    Physical Findings: AIMS:  , ,  ,  ,    CIWA:    COWS:      Musculoskeletal: Strength & Muscle Tone: within normal limits Gait & Station: normal Patient leans: N/A  Psychiatric Specialty Exam:  Presentation  General Appearance: Appropriate for Environment; Casual; Fairly Groomed  Eye Contact:Good  Speech:Clear and Coherent; Normal Rate  Speech Volume:Normal  Handedness:Right  Mood and Affect  Mood:-- ("improving")  Affect:Appropriate; Congruent  Thought Process  Thought Processes:Coherent; Goal Directed; Linear  Descriptions of Associations:Intact  Orientation:Full (Time, Place and Person)  Thought Content:Logical  History of Schizophrenia/Schizoaffective disorder:No  Duration of Psychotic Symptoms: Na  Hallucinations:Hallucinations: None  Ideas of Reference:None  Suicidal Thoughts:Suicidal Thoughts: No SI Passive Intent and/or Plan: Without Intent; Without Plan; Without Means to Carry Out; Without Access to Means  Homicidal Thoughts:Homicidal Thoughts: No  Sensorium  Memory:Immediate Good; Recent Good; Remote Good  Judgment:Intact  Insight:Fair  Executive Functions  Concentration:Good  Attention Span:Good  Recall:Good  Fund of Knowledge:Fair  Language:Good  Psychomotor Activity  Psychomotor Activity:Psychomotor Activity: Normal  Assets  Assets:Communication Skills; Desire for Improvement; Housing; Resilience; Social Support  Sleep  Sleep:Sleep: Good Number of Hours of Sleep: 6.75  Physical Exam: Physical Exam Vitals and nursing note reviewed.  HENT:     Nose: Nose normal.     Mouth/Throat:     Pharynx: Oropharynx is clear.  Eyes:     Pupils: Pupils are equal, round, and reactive to light.  Cardiovascular:     Rate and Rhythm: Normal rate.     Pulses: Normal pulses.  Pulmonary:     Effort: Pulmonary effort is normal.  Genitourinary:    Comments: Deferred Musculoskeletal:        General: Normal range of motion.     Cervical back: Normal range of motion.  Skin:    General: Skin is  warm and dry.  Neurological:     General: No focal deficit present.     Mental Status: He is alert and oriented to  person, place, and time.   Review of Systems  Constitutional:  Negative for chills and fever.  HENT:  Negative for congestion and sore throat.   Eyes:  Negative for blurred vision.  Respiratory:  Negative for cough, shortness of breath and wheezing.   Cardiovascular:  Negative for chest pain and palpitations.  Gastrointestinal:  Negative for abdominal pain, constipation, diarrhea, heartburn, nausea and vomiting.  Genitourinary:  Negative for dysuria.  Musculoskeletal:  Negative for joint pain and myalgias.  Skin: Negative.   Neurological:  Positive for seizures (Hx. Pseudoseizures). Negative for dizziness, tingling, tremors, sensory change, speech change, focal weakness, loss of consciousness, weakness and headaches.  Endo/Heme/Allergies:        Allergies: NKDA  Psychiatric/Behavioral:  Positive for depression ("Improving"). Negative for hallucinations, memory loss, substance abuse and suicidal ideas. The patient is not nervous/anxious and does not have insomnia.   Blood pressure 119/71, pulse 62, temperature 98 F (36.7 C), temperature source Oral, resp. rate 16, height 5' 8.5" (1.74 m), weight 95.2 kg, SpO2 100 %. Body mass index is 31.43 kg/m.  Treatment Plan Summary: Daily contact with patient to assess and evaluate symptoms and progress in treatment and Medication management.   Continue inpatient hospitalization.  Will continue today 01/21/2021 plan as below except where it is noted.   Diagnoses:  Major depressive disorder without psychosis.  Psychogenic nonepileptic seizure.  Plan. -Continue Fluoxetine 60 mg po daily for depression. -Continue Seroquel 50 mg po Q bedtime an adjunct to Fluoxetine/mood stability. -Continue Klonopin 1 mg po daily for anxiety/pseudo-seizures.  -Continue Klonopin 0.5 mg po Q evenings at 1800 for anxiety/pseudo-seizures. -Continue  Trazodone 50 mg po Q hs prn for insomnia. -Continue Lamictal 150 mg po at bedtime for pseudoseizures/mood stability.  Other medical issues. -Continue Lipitor 40 mg po daily for high cholesterol. -Continue Losartan 25 mg po daily for HTN. -Continue tylenol 650 mg po Q 6 hrs prn for fever/pain. -Continue Mylanta 30 ml po Q 4 hrs prn for indigestion. -Continue MOM 30 ml po daily prn for constipation.  Encourage group participation. Discharge disposition plan is in progress. Lindell Spar, NP, pmhnp. Fnp-bc 01/21/2021, 3:30 PM

## 2021-01-21 NOTE — Progress Notes (Signed)
Nutrition Brief Note RD working remotely.   Patient identified on the Malnutrition Screening Tool (MST) Report  Wt Readings from Last 15 Encounters:  01/19/21 95.2 kg  01/11/21 93.9 kg  01/01/21 94.8 kg  12/23/20 96 kg  12/13/20 95.3 kg  12/03/20 95.3 kg  11/08/20 98.4 kg  10/27/20 98.4 kg  08/17/20 93.9 kg  06/10/20 93.9 kg  05/30/20 95 kg  05/27/20 95.4 kg  05/26/20 93 kg  05/13/20 93 kg  05/09/20 94.8 kg    Body mass index is 31.43 kg/m. Patient meets criteria for obesity based on current BMI.   Patient admitted to [facility] for worsening depression and passive SI.  Current diet order is listed as "None". Labs and medications reviewed.   No nutrition interventions warranted at this time. If nutrition issues arise, please consult RD.      Jarome Matin, MS, RD, LDN, CNSC Inpatient Clinical Dietitian RD pager # available in [place]  After hours/weekend pager # available in [facility]

## 2021-01-21 NOTE — Group Note (Signed)
Type of Therapy and Topic:  Group Therapy:  Stress Management   Participation Level:  Active    Description of Group:  Patients in this group were introduced to the idea of stress and encouraged to discuss negative and positive ways to manage stress. Patients discussed specific stressors that they have in their life right now and the physical signs and symptoms associated with that stress.  Patient encouraged to come up with positive changes to assist with the stress upon discharge in order to prevent future hospitalizations.   They also worked as a group on developing a specific plan for several patients to deal with stressors through [place], psychoeducation and self care techniques   Therapeutic Goals:               1)  To discuss the positive and negative impacts of stress             2)  identify signs and symptoms of stress             3)  generate ideas for stress management             4)  offer mutual support to others regarding stress management             5)  Developing plans for ways to manage specific stressors upon discharge               Summary of Patient Progress:  Patient participated appropriately in group.  He had good insight into the group topic and was appropriate and respectful to other peers. Debra shared that work stresses him out. He manages work stress by not talking about work when he gets home and giving himself only 25 minutes to talk about it with his partner.    Therapeutic Modalities:   Motivational Interviewing Brief Solution-Focused Therapy   Marshel Golubski, LCSW, [place] Social Worker  [facility]

## 2021-01-21 NOTE — Progress Notes (Signed)
° ° °   01/21/21 2000  Psych Admission Type (Psych Patients Only)  Admission Status Voluntary  Psychosocial Assessment  Patient Complaints Anxiety  Eye Contact Fair  Facial Expression Animated;Anxious  Affect Anxious  Speech Soft  Interaction Assertive  Motor Activity Other (Comment) (steady)  Appearance/Hygiene Unremarkable  Behavior Characteristics Cooperative  Mood Anxious  Thought Process  Coherency WDL  Content Blaming self  Delusions None reported or observed  Perception WDL  Hallucination None reported or observed  Judgment WDL  Confusion None  Danger to Self  Current suicidal ideation? Denies  Self-Injurious Behavior No self-injurious ideation or behavior indicators observed or expressed   Agreement Not to Harm Self Yes  Description of Agreement verbal contract for safety  Danger to Others  Danger to Others None reported or observed

## 2021-01-21 NOTE — BH IP Treatment Plan (Signed)
Interdisciplinary Treatment and Diagnostic Plan Update  01/21/2021 Time of Session: 9:05am  FLETCHER OSTERMILLER MRN: [id number]  Principal Diagnosis: MDD (major depressive disorder), single episode, severe , no psychosis ([place])  Secondary Diagnoses: Principal Problem:   MDD (major depressive disorder), single episode, severe , no psychosis ([place]) Active Problems:   Psychogenic nonepileptic seizure   Current Medications:  Current Facility-Administered Medications  Medication Dose Route Frequency Provider Last Rate Last Admin   acetaminophen (TYLENOL) tablet 650 mg  650 mg Oral Q6H PRN Derrill Center, NP       alum & mag hydroxide-simeth (MAALOX/MYLANTA) 200-200-20 MG/5ML suspension 30 mL  30 mL Oral Q4H PRN Derrill Center, NP       atorvastatin (LIPITOR) tablet 40 mg  40 mg Oral Daily Derrill Center, NP   40 mg at 01/21/21 0755   clonazePAM (KLONOPIN) tablet 0.5 mg  0.5 mg Oral Daily Nicholes Rough, NP   0.5 mg at 01/20/21 1822   clonazePAM (KLONOPIN) tablet 1 mg  1 mg Oral Daily Nkwenti, Tamela Oddi, NP   1 mg at 01/21/21 1000   FLUoxetine (PROZAC) capsule 60 mg  60 mg Oral Daily Nicholes Rough, NP   60 mg at 01/21/21 0755   lamoTRIgine (LAMICTAL) tablet 150 mg  150 mg Oral QHS Derrill Center, NP   150 mg at 01/20/21 2120   losartan (COZAAR) tablet 25 mg  25 mg Oral Daily Derrill Center, NP   25 mg at 01/21/21 0755   magnesium hydroxide (MILK OF MAGNESIA) suspension 30 mL  30 mL Oral Daily PRN Derrill Center, NP       QUEtiapine (SEROQUEL) tablet 50 mg  50 mg Oral QHS Nwoko, Agnes I, NP       traZODone (DESYREL) tablet 50 mg  50 mg Oral QHS PRN Nicholes Rough, NP   50 mg at 01/20/21 2119   PTA Medications: No medications prior to admission.    Patient Stressors: Health problems    Patient Strengths: Ability for insight  Average or above average intelligence  Capable of independent living  Communication skills  General fund of knowledge  Motivation for treatment/growth  Supportive  family/friends  Work skills   Treatment Modalities: Medication Management, Group therapy, Case management,  1 to 1 session with clinician, Psychoeducation, Recreational therapy.   Physician Treatment Plan for Primary Diagnosis: MDD (major depressive disorder), single episode, severe , no psychosis ([place]) Long Term Goal(s):     Short Term Goals:    Medication Management: Evaluate patient's response, side effects, and tolerance of medication regimen.  Therapeutic Interventions: 1 to 1 sessions, Unit Group sessions and Medication administration.  Evaluation of Outcomes: Not Met  Physician Treatment Plan for Secondary Diagnosis: Principal Problem:   MDD (major depressive disorder), single episode, severe , no psychosis ([place]) Active Problems:   Psychogenic nonepileptic seizure  Long Term Goal(s):     Short Term Goals:       Medication Management: Evaluate patient's response, side effects, and tolerance of medication regimen.  Therapeutic Interventions: 1 to 1 sessions, Unit Group sessions and Medication administration.  Evaluation of Outcomes: Not Met   RN Treatment Plan for Primary Diagnosis: MDD (major depressive disorder), single episode, severe , no psychosis ([place]) Long Term Goal(s): Knowledge of disease and therapeutic regimen to maintain health will improve  Short Term Goals: Ability to remain free from injury will improve, Ability to participate in decision making will improve, Ability to verbalize feelings will improve, Ability to disclose  and discuss suicidal ideas, and Ability to identify and develop effective coping behaviors will improve  Medication Management: RN will administer medications as ordered by provider, will assess and evaluate patient's response and provide education to patient for prescribed medication. RN will report any adverse and/or side effects to prescribing provider.  Therapeutic Interventions: 1 on 1 counseling sessions, Psychoeducation, Medication  administration, Evaluate responses to treatment, Monitor vital signs and CBGs as ordered, Perform/monitor CIWA, COWS, AIMS and Fall Risk screenings as ordered, Perform wound care treatments as ordered.  Evaluation of Outcomes: Not Met   LCSW Treatment Plan for Primary Diagnosis: MDD (major depressive disorder), single episode, severe , no psychosis ([place]) Long Term Goal(s): Safe transition to appropriate next level of care at discharge, Engage patient in therapeutic group addressing interpersonal concerns.  Short Term Goals: Engage patient in aftercare planning with referrals and resources, Increase social support, Increase emotional regulation, Facilitate acceptance of mental health diagnosis and concerns, Identify triggers associated with mental health/substance abuse issues, and Increase skills for wellness and recovery  Therapeutic Interventions: Assess for all discharge needs, 1 to 1 time with Social worker, Explore available resources and support systems, Assess for adequacy in community support network, Educate family and significant other(s) on suicide prevention, Complete Psychosocial Assessment, Interpersonal group therapy.  Evaluation of Outcomes: Not Met   Progress in Treatment: Attending groups: Yes. Participating in groups: Yes. Taking medication as prescribed: Yes. Toleration medication: Yes. Family/Significant other contact made: Yes, individual(s) contacted:  Wife  Patient understands diagnosis: Yes. Discussing patient identified problems/goals with staff: Yes. Medical problems stabilized or resolved: Yes. Denies suicidal/homicidal ideation: Yes. Issues/concerns per patient self-inventory: No.   New problem(s) identified: No, Describe:  None   New Short Term/Long Term Goal(s): medication stabilization, elimination of SI thoughts, development of comprehensive mental wellness plan.   Patient Goals: "To go to groups and learn to talk about my feelings more"   Discharge  Plan or Barriers: Patient recently admitted. CSW will continue to follow and assess for appropriate referrals and possible discharge planning.   Reason for Continuation of Hospitalization: Depression Medication stabilization Suicidal ideation  Estimated Length of Stay: 3 to 5 days    Scribe for Treatment Team: Carney Harder 01/21/2021 2:31 PM

## 2021-01-21 NOTE — BHH Group Notes (Signed)
Pt attended morning goals group and stated that his goal for today was to work on getting out of the hospital.

## 2021-01-21 NOTE — Progress Notes (Signed)
°   01/21/21 1100  Psych Admission Type (Psych Patients Only)  Admission Status Voluntary  Psychosocial Assessment  Patient Complaints Anxiety  Eye Contact Fair  Facial Expression Animated;Anxious  Affect Anxious  Speech Soft  Interaction Assertive  Motor Activity Other (Comment) (Steady)  Appearance/Hygiene Unremarkable  Behavior Characteristics Cooperative  Mood Anxious  Thought Process  Coherency WDL  Content Blaming self  Delusions None reported or observed  Perception WDL  Hallucination None reported or observed  Judgment WDL  Confusion None  Danger to Self  Current suicidal ideation? Denies  Self-Injurious Behavior No self-injurious ideation or behavior indicators observed or expressed   Agreement Not to Harm Self Yes  Description of Agreement verbal contract for safety  Danger to Others  Danger to Others None reported or observed   Pt reported that he is feeling happy and getting emotionally ready for discharge soon.  Pt took medications without incident.  Pt's vitals have been stable.  Pt is coping well at this time.

## 2021-01-21 NOTE — Progress Notes (Signed)
Pt attended and participated in the NA meeting tonight.

## 2021-01-21 NOTE — Group Note (Signed)
Recreation Therapy Group Note   Group Topic:Stress Management  Group Date: 01/21/2021 Start Time: 0930 End Time: 0945 Facilitators: Victorino Sparrow, [place] Location: [address] Day[address]   Goal Area(s) Addresses:  Patient will identify positive stress management techniques. Patient will identify benefits of using stress management post d/c.  Group Description: LRT explained the technique of meditation to patients.  LRT went on to play a meditation that focused on showing more compassion to ones self.  Patients were to listen and follow along as meditation played to fully engage in activity.   Affect/Mood: Appropriate   Participation Level: Engaged   Participation Quality: Independent   Behavior: Appropriate   Speech/Thought Process: Focused   Insight: Good   Judgement: Good   Modes of Intervention: Meditation   Patient Response to Interventions:  Engaged   Education Outcome:  Acknowledges education and In group clarification offered    Clinical Observations/Individualized Feedback: Pt attended and participated in group.    Plan: Continue to engage patient in RT group sessions 2-3x/week.   Victorino Sparrow, LRT,CTRS 01/21/2021 11:25 AM

## 2021-01-21 NOTE — Plan of Care (Signed)
°  Problem: Coping: Goal: Coping ability will improve Outcome: Progressing   Problem: Medication: Goal: Compliance with prescribed medication regimen will improve Outcome: Progressing   Problem: Activity: Goal: Imbalance in normal sleep/wake cycle will improve Outcome: Progressing

## 2021-01-22 MED ORDER — TRAZODONE HCL 50 MG PO TABS: 50.0000 mg | ORAL_TABLET | Freq: Every evening | ORAL | 0 refills | Status: DC | PRN

## 2021-01-22 MED ORDER — CLONAZEPAM 0.5 MG PO TABS: 0.5000 mg | ORAL_TABLET | Freq: Every day | ORAL | 0 refills | Status: DC

## 2021-01-22 MED ORDER — FLUOXETINE HCL 20 MG PO CAPS: 60.0000 mg | ORAL_CAPSULE | Freq: Every day | ORAL | 0 refills | Status: DC

## 2021-01-22 MED ORDER — ATORVASTATIN CALCIUM 40 MG PO TABS: 40.0000 mg | ORAL_TABLET | Freq: Every day | ORAL | 0 refills | Status: DC

## 2021-01-22 MED ORDER — CLONAZEPAM 1 MG PO TABS: 1.0000 mg | ORAL_TABLET | Freq: Every day | ORAL | 0 refills | Status: DC

## 2021-01-22 MED ORDER — QUETIAPINE FUMARATE 50 MG PO TABS: 50.0000 mg | ORAL_TABLET | Freq: Every day | ORAL | 0 refills | Status: DC

## 2021-01-22 MED ORDER — LOSARTAN POTASSIUM 25 MG PO TABS: 25.0000 mg | ORAL_TABLET | Freq: Every day | ORAL | 0 refills | Status: DC

## 2021-01-22 MED ORDER — LAMOTRIGINE 150 MG PO TABS: 150.0000 mg | ORAL_TABLET | Freq: Every day | ORAL | 0 refills | Status: DC

## 2021-01-22 NOTE — BHH Group Notes (Signed)
Pt attended morning goals group.

## 2021-01-22 NOTE — Discharge Summary (Signed)
Physician Discharge Summary Note  Patient:  Mathew Moore is an 55 y.o., male MRN:  [id number] DOB:  [birth year] Patient phone:  [phone] (home)  Patient address:   [place] [address],   Total Time spent with patient:  Greater than 30 minutes  Date of Admission:  01/19/2021  Date of Discharge: 01-22-21  Reason for Admission: Reports worsening depression and passive suicidal ideations.  Principal Problem: MDD (major depressive disorder), single episode, severe , no psychosis ([place])  Discharge Diagnoses: Principal Problem:   MDD (major depressive disorder), single episode, severe , no psychosis ([place]) Active Problems:   Psychogenic nonepileptic seizure  Past Psychiatric History: Major depressive disorder, recurrent episodes.  Past Medical History:  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   Pschogenic nonepileptic, last one 11/28/20   Psychogenic nonepileptic seizure    Sarcoidosis 07/2019    Past Surgical History:  Procedure Laterality Date   BRONCHOSCOPY     CLEFT LIP REPAIR     COLONOSCOPY     HERNIA REPAIR Left    LEFT HEART CATH AND CORONARY ANGIOGRAPHY N/A 08/23/2018   Procedure: LEFT HEART CATH AND CORONARY ANGIOGRAPHY;  Surgeon: Burnell Blanks, MD;  Location: [facility];  Service: Cardiovascular;  Laterality: N/A;   MEDIASTINOSCOPY N/A 07/20/2019   Procedure: MEDIASTINOSCOPY;  Surgeon: Melrose Nakayama, MD;  Location: [facility];  Service: Thoracic;  Laterality: N/A;   NASAL SEPTOPLASTY W/ TURBINOPLASTY Bilateral 12/03/2020   Procedure: REVISION OF NASAL SEPTOPLASTY AND INFERIOR TURBINATE REDUCTION;  Surgeon: Jerrell Belfast, MD;  Location: [facility];  Service: ENT;  Laterality: Bilateral;   NASAL SEPTUM SURGERY     PLANTAR FASCIA SURGERY  Right    SINUS ENDO W/FUSION Bilateral 12/03/2020   Procedure: BILATERAL ENDOSCOPIC SINUS SURGERY WITH NAVIGATION;  Surgeon: Jerrell Belfast, MD;  Location: [facility];  Service: ENT;  Laterality: Bilateral;   VIDEO BRONCHOSCOPY WITH ENDOBRONCHIAL ULTRASOUND N/A 07/06/2019   Procedure: VIDEO BRONCHOSCOPY WITH ENDOBRONCHIAL ULTRASOUND;  Surgeon: Marshell Garfinkel, MD;  Location: [facility];  Service: Pulmonary;  Laterality: N/A;   Family History:  Family History  Problem Relation Age of Onset   Breast cancer Mother    Heart disease Father    Lung cancer Father    Heart attack Father    Family Psychiatric  History: See H&P  Social History:  Social History   Substance and Sexual Activity  Alcohol Use None     Social History   Substance and Sexual Activity  Drug Use Never    Social History   Socioeconomic History   Marital status: Married    Spouse name: Teacher, music   Number of children: 2   Years of education: Not on file   Highest education level: Not on file  Occupational History   Not on file  Tobacco Use   Smoking status: Never   Smokeless tobacco: Never  Vaping Use   Vaping Use: Never used  Substance and Sexual Activity   Alcohol use: Not on file   Drug use: Never   Sexual activity: Yes    Birth control/protection: None  Other Topics Concern   Not on file  Social History Narrative   Lives with wife   Social Determinants of Health   Financial Resource Strain: Not on  file  Food Insecurity: Not on file  Transportation Needs: Not on file  Physical Activity: Not on file  Stress: Not on file  Social Connections: Not on file   Hospital Course: (Per Md's admission evaluation notes): Mathew Moore presents to [facility] urgent care voluntary.  He reports worsening depression and passive suicidal ideations.  States multiple stressors that has contributed to his depression lately.  States he is currently unemployment because he has seizure at work and has not been able to  drive back and forth to work for the past 10 months.  Reports symptoms of worry related to his son being in the [employer]. Reported financial strains and feels like a burden to family and friends.   Prior to this discharge, Mathew Moore was seen & evaluated for mental health stability. The current laboratory findings were reviewed (stable), nurses notes & vital signs were reviewed as well. There are no current mental health or medical issues that should prevent this discharge at this time. Patient is being discharged to continue mental health care & medication management on an outpatient basis as noted below.   After the above admission evaluation, Mathew Moore's presenting symptoms were noted. He was recommended for mood stabilization treatments. The medication regimen targeting those presenting symptoms were discussed with him & initiated with his consent. He was medicated, stabilized & discharged on the medications as listed on his discharge medication lists below. Besides the mood stabilization treatments, he was was also enrolled & participated in the group counseling sessions being offered & held on this unit. He learned coping skills. He presented other significant pre-existing medical issues that required treatment. He was resumed, treated & discharged on all the pertinent medications used for those health issues. He tolerated his treatment regimen without any adverse effects or reactions reported. Mathew Moore's symptoms responded well to his treatment regimen warranting this discharge. Patient is also mentally/medically stable & agreeable to this discharge.  During the course of this this hospitalization, there were no instances of behavioral issues that required restraints or immediate intervention. Patient remained safe on the unit. There were no instances of self-harming behavior observed or noted by staff. There were no threats to other patients/staff. Mathew Moore remained cooperative to his daily routines & taking  his treatment regimen as recommended by his treatment team. He participated in the group sessions & interacted with staff and other patients appropriately. Over the course of this hospitalization, patient's symptoms responded well to his treatment regimen & his mood improved. He was able to maintain his personal hygiene.   On this day of his hospital discharge, patient denies any thoughts of self-harm, suicidal/homicidal ideations. He is not observed to be responding to internal any internal stimuli. He has been compliant with his recommended treatment regimen. He is currently showing readiness for discharge & is future-oriented thinking. Patient is instructed & encouraged to keep all psychiatric appointments and continue with his medications as prescribed. He left [facility] with all personal belongings in no apparent distress. Transportation per his wife.   Physical Findings: AIMS:  , ,  ,  ,    CIWA:    COWS:     Musculoskeletal: Strength & Muscle Tone: within normal limits Gait & Station: normal Patient leans: N/A   Psychiatric Specialty Exam:  Presentation  General Appearance: Appropriate for Environment; Casual; Fairly Groomed  Eye Contact:Good  Speech:Clear and Coherent; Normal Rate  Speech Volume:Normal  Handedness:Right   Mood and Affect  Mood:-- ("improving")  Affect:Appropriate; Congruent   Thought Process  Thought Processes:Coherent; Goal Directed; Linear  Descriptions of Associations:Intact  Orientation:Full (Time, Place and Person)  Thought Content:Logical  History of Schizophrenia/Schizoaffective disorder:No  Duration of Psychotic Symptoms:No data recorded Hallucinations:Hallucinations: None  Ideas of Reference:None  Suicidal Thoughts:Suicidal Thoughts: No SI Passive Intent and/or Plan: Without Intent; Without Plan; Without Means to Carry Out; Without Access to Means  Homicidal Thoughts:Homicidal Thoughts: No  Sensorium  Memory:Immediate Good; Recent  Good; Remote Good  Judgment:Intact  Insight:Fair  Executive Functions  Concentration:Good  Attention Span:Good  Recall:Good  Fund of Knowledge:Fair  Language:Good  Psychomotor Activity  Psychomotor Activity:Psychomotor Activity: Normal  Assets  Assets:Communication Skills; Desire for Improvement; Housing; Resilience; Social Support  Sleep  Sleep:Sleep: Good Number of Hours of Sleep: 6.75  Physical Exam: Physical Exam Vitals and nursing note reviewed.  HENT:     Head: Normocephalic.     Nose: Nose normal.     Mouth/Throat:     Pharynx: Oropharynx is clear.  Eyes:     Pupils: Pupils are equal, round, and reactive to light.  Cardiovascular:     Rate and Rhythm: Normal rate.     Pulses: Normal pulses.     Comments: Hx. HTN Pulmonary:     Effort: Pulmonary effort is normal.  Genitourinary:    Comments: Deferred Musculoskeletal:        General: Normal range of motion.     Cervical back: Normal range of motion.  Skin:    General: Skin is warm and dry.     Capillary Refill: Capillary refill takes less than 2 seconds.  Neurological:     General: No focal deficit present.     Mental Status: He is alert and oriented to person, place, and time. Mental status is at baseline.   Review of Systems  Constitutional:  Negative for chills, fever and malaise/fatigue.  HENT:  Negative for congestion and sore throat.   Eyes:  Negative for blurred vision.  Respiratory:  Negative for cough, shortness of breath and wheezing.   Cardiovascular:  Negative for chest pain and palpitations.       Hx. HTN  Gastrointestinal:  Negative for abdominal pain, constipation, diarrhea, heartburn, nausea and vomiting.  Genitourinary:  Negative for dysuria.  Musculoskeletal:  Negative for joint pain and myalgias.  Skin: Negative.   Neurological:  Negative for dizziness, tingling, tremors, sensory change, speech change, focal weakness, seizures, loss of consciousness, weakness and headaches.   Endo/Heme/Allergies:        Allergies: NKDA  Psychiatric/Behavioral:  Positive for depression (Hx of (stable on medications)). Negative for hallucinations, memory loss, substance abuse (UDS (+) for benzod (prescribed).) and suicidal ideas. The patient has insomnia (Hx of (stable on medication)). The patient is not nervous/anxious (Stable on medications).   Blood pressure 108/74, pulse 68, temperature (!) 97.5 F (36.4 C), temperature source Oral, resp. rate 16, height 5' 8.5" (1.74 m), weight 95.2 kg, SpO2 100 %. Body mass index is 31.43 kg/m.   Social History   Tobacco Use  Smoking Status Never  Smokeless Tobacco Never   Tobacco Cessation:  N/A, patient does not currently use tobacco products  Blood Alcohol level:  Lab Results  Component Value Date   ETH <10 08/17/2020   ETH <10 16/11/9602   Metabolic Disorder Labs:  Lab Results  Component Value Date   HGBA1C 5.4 01/20/2021   MPG 108.28 01/20/2021   No results found for: PROLACTIN Lab Results  Component Value Date   CHOL 136 01/19/2021   TRIG 89 01/19/2021  HDL 37 (L) 01/19/2021   CHOLHDL 3.7 01/19/2021   VLDL 18 01/19/2021   LDLCALC 81 01/19/2021   See Psychiatric Specialty Exam and Suicide Risk Assessment completed by Attending Physician prior to discharge.  Discharge destination:  Home  Is patient on multiple antipsychotic therapies at discharge:  No   Has Patient had three or more failed trials of antipsychotic monotherapy by history:  No  Recommended Plan for Multiple Antipsychotic Therapies: NA  Allergies as of 01/22/2021   No Known Allergies      Medication List     TAKE these medications      Indication  atorvastatin 40 MG tablet Commonly known as: LIPITOR Take 1 tablet (40 mg total) by mouth daily. For high cholesterol Start taking on: January 23, 2021  Indication: Inherited Homozygous Hypercholesterolemia   clonazePAM 0.5 MG tablet Commonly known as: KLONOPIN Take 1 tablet (0.5 mg  total) by mouth daily. For anxiety  Indication: Feeling Anxious, Daily in the evenings   clonazePAM 1 MG tablet Commonly known as: KLONOPIN Take 1 tablet (1 mg total) by mouth daily. For anxiety Start taking on: January 23, 2021  Indication: Feeling Anxious, Daily in the mornings   FLUoxetine 20 MG capsule Commonly known as: PROZAC Take 3 capsules (60 mg total) by mouth daily. For depression Start taking on: January 23, 2021  Indication: Major Depressive Disorder   lamoTRIgine 150 MG tablet Commonly known as: LAMICTAL Take 1 tablet (150 mg total) by mouth at bedtime. For mood stabilization  Indication: Mood stabilization, pseudo-seizures   losartan 25 MG tablet Commonly known as: COZAAR Take 1 tablet (25 mg total) by mouth daily. For high blood pressure Start taking on: January 23, 2021  Indication: High Blood Pressure Disorder   QUEtiapine 50 MG tablet Commonly known as: SEROQUEL Take 1 tablet (50 mg total) by mouth at bedtime. An adjunct to antidepressant  Indication: Major Depressive Disorder   traZODone 50 MG tablet Commonly known as: DESYREL Take 1 tablet (50 mg total) by mouth at bedtime as needed for sleep.  Indication: Trouble Sleeping, Give if Seroquel is not effective in inducing sleep        [place]. Go on 02/05/2021.   Specialty: Behavioral Health Why: You have an appointment on 02/05/21 at 3:00 pm for therapy services.  You also have an appointment on 02/11/21 at 10:00 am for medication management services. These appointments will be held in person. Contact information: [place] [address] [phone]         BEHAVIORAL HEALTH PARTIAL HOSPITALIZATION PROGRAM Follow up on 01/28/2021.   Specialty: Behavioral Health Why: You have an assessment appointment on 01/28/21 at 1:30 pm for the partial hospitalization program for intensive therapy services and medication  management.  * THIS APPT IS FOR ASSESSMENT ONLY.  If you have any questions, please call [phone]. Contact information: [place] [id number] [place] [phone]               Follow-up recommendations: Activity:  As tolerated Diet: As recommended by your primary care doctor. Keep all scheduled follow-up appointments as recommended.    Comments: Comments: Patient is recommended to follow-up care on an outpatient basis as noted above. Prescriptions sent to pt's pharmacy of choice at discharge.   Patient agreeable to plan.   Given opportunity to ask questions.   Appears to feel comfortable with discharge denies any  current suicidal or homicidal thought. Patient is also instructed prior to discharge to: Take all medications as prescribed by his/her mental healthcare provider. Report any adverse effects and or reactions from the medicines to his/her outpatient provider promptly. Patient has been instructed & cautioned: To not engage in alcohol and or illegal drug use while on prescription medicines. In the event of worsening symptoms, patient is instructed to call the crisis hotline, 911 and or go to the nearest ED for appropriate evaluation and treatment of symptoms. To follow-up with his/her primary care provider for your other medical issues, concerns and or health care needs.   Signed: Lindell Spar, NP, pmhnp, fnp-bc 01/22/2021, 10:15 AM
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
## 2021-01-22 NOTE — Progress Notes (Signed)
Discharge note: Nursing discharge note: Patient discharged home per MD order.  Patient received all personal belongings from unit and locker.  Reviewed AVS/transition record with patient and he indicates understanding.  Patient will follow up with [facility].  Patient denies any thoughts of self harm.  He left ambulatory with his wife.  01/22/21 1000  Psych Admission Type (Psych Patients Only)  Admission Status Voluntary  Psychosocial Assessment  Patient Complaints Anxiety  Eye Contact Fair  Facial Expression Animated  Affect Anxious  Speech Soft  Interaction Assertive  Motor Activity Other (Comment) (wnl)  Appearance/Hygiene Unremarkable  Behavior Characteristics Cooperative  Mood Pleasant  Thought Process  Coherency WDL  Content Blaming self  Delusions None reported or observed  Perception WDL  Hallucination None reported or observed  Judgment WDL  Confusion None  Danger to Self  Current suicidal ideation? Denies  Self-Injurious Behavior No self-injurious ideation or behavior indicators observed or expressed   Agreement Not to Harm Self Yes  Description of Agreement verbal  Danger to Others  Danger to Others None reported or observed

## 2021-01-22 NOTE — Progress Notes (Signed)
°  [facility] Adult Case Management Discharge Plan :  Will you be returning to the same living situation after discharge:  Yes,  personal home At discharge, do you have transportation home?: Yes,  wife Do you have the ability to pay for your medications: Yes,  private insurance  Release of information consent forms completed and in the chart;  Patient's signature needed at discharge.  Patient to Follow up at:  Follow-up [place], [facility]. Go on 02/05/2021.   Specialty: Behavioral Health Why: You have an appointment on 02/05/21 at 3:00 pm for therapy services.  You also have an appointment on 02/11/21 at 10:00 am for medication management services. These appointments will be held in person. Contact information: [place] [address] [phone]         BEHAVIORAL HEALTH PARTIAL HOSPITALIZATION PROGRAM Follow up on 01/28/2021.   Specialty: Behavioral Health Why: You have an assessment appointment on 01/28/21 at 1:30 pm for the partial hospitalization program for intensive therapy services and medication management.  * THIS APPT IS FOR ASSESSMENT ONLY.  If you have any questions, please call [phone]. Contact information: [place] [id number] [place] [phone]                Next level of care provider has access to [place] and Suicide Prevention discussed: Yes,  wife     Has patient been referred to the Quitline?: N/A patient is not a smoker  Patient has been referred for addiction treatment: N/A  [name], [place] 01/22/2021, 10:09 AM

## 2021-01-22 NOTE — BHH Suicide Risk Assessment (Signed)
Suicide Risk Assessment  Discharge Assessment    [facility] Discharge Suicide Risk Assessment   Principal Problem: MDD (major depressive disorder), single episode, severe , no psychosis ([place])  Discharge Diagnoses: Principal Problem:   MDD (major depressive disorder), single episode, severe , no psychosis ([place]) Active Problems:   Psychogenic nonepileptic seizure  Total Time spent with patient: 20 minutes  Musculoskeletal: Strength & Muscle Tone: within normal limits Gait & Station: normal Patient leans: N/A  Psychiatric Specialty Exam  Presentation  General Appearance: Appropriate for Environment; Casual; Fairly Groomed  Eye Contact:Good  Speech:Clear and Coherent; Normal Rate  Speech Volume:Normal  Handedness:Right  Mood and Affect  Mood:-- ("improving")  Duration of Depression Symptoms: Greater than two weeks  Affect:Appropriate; Congruent   Thought Process  Thought Processes:Coherent; Goal Directed; Linear  Descriptions of Associations:Intact  Orientation:Full (Time, Place and Person)  Thought Content:Logical  History of Schizophrenia/Schizoaffective disorder:No  Duration of Psychotic Symptoms:No data recorded Hallucinations:Hallucinations: None  Ideas of Reference:None  Suicidal Thoughts:Suicidal Thoughts: No SI Passive Intent and/or Plan: Without Intent; Without Plan; Without Means to Carry Out; Without Access to Means  Homicidal Thoughts:Homicidal Thoughts: No   Sensorium  Memory:Immediate Good; Recent Good; Remote Good  Judgment:Intact  Insight:Fair  Executive Functions  Concentration:Good  Attention Span:Good  Recall:Good  Fund of Knowledge:Fair  Language:Good   Psychomotor Activity  Psychomotor Activity:Psychomotor Activity: Normal  Assets  Assets:Communication Skills; Desire for Improvement; Housing; Resilience; Social Support  Sleep  Sleep:Sleep: Good Number of Hours of Sleep: 6.75   Physical Exam: See discharge  summary.  Blood pressure 108/74, pulse 68, temperature (!) 97.5 F (36.4 C), temperature source Oral, resp. rate 16, height 5' 8.5" (1.74 m), weight 95.2 kg, SpO2 100 %. Body mass index is 31.43 kg/m.  Mental Status Per Nursing Assessment::   On Admission:  NA  Demographic Factors:  Adolescent or young adult, Caucasian, Low socioeconomic status, and Unemployed  Loss Factors: Decrease in vocational status and Financial problems/change in socioeconomic status  Historical Factors: NA  Risk Reduction Factors:   Sense of responsibility to family, Religious beliefs about death, Living with another person, especially a relative, Positive social support, Positive therapeutic relationship, and Positive coping skills or problem solving skills  Continued Clinical Symptoms:  Depression:   Insomnia Severe Epilepsy  Cognitive Features That Contribute To Risk:  None    Suicide Risk:  Minimal: No identifiable suicidal ideation.  Patients presenting with no risk factors but with morbid ruminations; may be classified as minimal risk based on the severity of the depressive symptoms   [facility]. Go on 02/05/2021.   Specialty: Behavioral Health Why: You have an appointment on 02/05/21 at 3:00 pm for therapy services.  You also have an appointment on 02/11/21 at 10:00 am for medication management services. These appointments will be held in person. Contact information: [place] [address] [phone]         BEHAVIORAL HEALTH PARTIAL HOSPITALIZATION PROGRAM Follow up on 01/28/2021.   Specialty: Behavioral Health Why: You have an assessment appointment on 01/28/21 at 1:30 pm for the partial hospitalization program for intensive therapy services and medication management.  * THIS APPT IS FOR ASSESSMENT ONLY.  If you have any questions, please call [phone]. Contact information: [place] [id number] [place] [phone]               Plan Of Care/Follow-up recommendations: Patient will  follow-up for routine mental health care & medication management as noted above. Activity:  As tolerated Diet: As recommended by your primary care doctor. Keep all scheduled follow-up appointments as recommended.   Lindell Spar, NP 01/22/2021, 11:35 AM

## 2021-01-28 ENCOUNTER — Other Ambulatory Visit (HOSPITAL_COMMUNITY): Payer: 59

## 2021-01-28 ENCOUNTER — Other Ambulatory Visit: Payer: Self-pay

## 2021-01-28 ENCOUNTER — Telehealth (HOSPITAL_COMMUNITY): Payer: Self-pay | Admitting: Professional

## 2021-01-30 ENCOUNTER — Other Ambulatory Visit: Payer: Self-pay

## 2021-01-30 ENCOUNTER — Emergency Department (HOSPITAL_COMMUNITY): Payer: 59

## 2021-01-30 ENCOUNTER — Emergency Department (HOSPITAL_COMMUNITY)
Admission: EM | Admit: 2021-01-30 | Discharge: 2021-01-30 | Disposition: A | Discharge status: Home / Self Care | Payer: 59 | Attending: Emergency Medicine | Admitting: Emergency Medicine

## 2021-01-30 ENCOUNTER — Encounter (HOSPITAL_COMMUNITY): Payer: Self-pay | Admitting: *Deleted

## 2021-01-30 ENCOUNTER — Telehealth (HOSPITAL_COMMUNITY): Payer: Self-pay | Admitting: Family Medicine

## 2021-01-30 DIAGNOSIS — I10 Essential (primary) hypertension: Secondary | ICD-10-CM | POA: Insufficient documentation

## 2021-01-30 DIAGNOSIS — R55 Syncope and collapse: Secondary | ICD-10-CM | POA: Insufficient documentation

## 2021-01-30 DIAGNOSIS — S01411A Laceration without foreign body of right cheek and temporomandibular area, initial encounter: Secondary | ICD-10-CM | POA: Diagnosis not present

## 2021-01-30 DIAGNOSIS — F445 Conversion disorder with seizures or convulsions: Secondary | ICD-10-CM

## 2021-01-30 DIAGNOSIS — I2510 Atherosclerotic heart disease of native coronary artery without angina pectoris: Secondary | ICD-10-CM | POA: Diagnosis not present

## 2021-01-30 DIAGNOSIS — S0993XA Unspecified injury of face, initial encounter: Secondary | ICD-10-CM | POA: Diagnosis present

## 2021-01-30 DIAGNOSIS — W182XXA Fall in (into) shower or empty bathtub, initial encounter: Secondary | ICD-10-CM | POA: Insufficient documentation

## 2021-01-30 DIAGNOSIS — R569 Unspecified convulsions: Secondary | ICD-10-CM | POA: Insufficient documentation

## 2021-01-30 DIAGNOSIS — S0181XA Laceration without foreign body of other part of head, initial encounter: Secondary | ICD-10-CM

## 2021-01-30 LAB — CBC WITH DIFFERENTIAL/PLATELET
Abs Immature Granulocytes: 0.01 10*3/uL (ref 0.00–0.07)
Basophils Absolute: 0 10*3/uL (ref 0.0–0.1)
Basophils Relative: 1 %
Eosinophils Absolute: 0.2 10*3/uL (ref 0.0–0.5)
Eosinophils Relative: 3 %
HCT: 42.7 % (ref 39.0–52.0)
Hemoglobin: 13.9 g/dL (ref 13.0–17.0)
Immature Granulocytes: 0 %
Lymphocytes Relative: 38 %
Lymphs Abs: 2 10*3/uL (ref 0.7–4.0)
MCH: 28.7 pg (ref 26.0–34.0)
MCHC: 32.6 g/dL (ref 30.0–36.0)
MCV: 88 fL (ref 80.0–100.0)
Monocytes Absolute: 0.3 10*3/uL (ref 0.1–1.0)
Monocytes Relative: 6 %
Neutro Abs: 2.7 10*3/uL (ref 1.7–7.7)
Neutrophils Relative %: 52 %
Platelets: 325 10*3/uL (ref 150–400)
RBC: 4.85 MIL/uL (ref 4.22–5.81)
RDW: 13.2 % (ref 11.5–15.5)
WBC: 5.2 10*3/uL (ref 4.0–10.5)
nRBC: 0 % (ref 0.0–0.2)

## 2021-01-30 LAB — COMPREHENSIVE METABOLIC PANEL
ALT: 30 U/L (ref 0–44)
AST: 24 U/L (ref 15–41)
Albumin: 4.3 g/dL (ref 3.5–5.0)
Alkaline Phosphatase: 102 U/L (ref 38–126)
Anion gap: 9 (ref 5–15)
BUN: 19 mg/dL (ref 6–20)
CO2: 27 mmol/L (ref 22–32)
Calcium: 8.8 mg/dL — ABNORMAL LOW (ref 8.9–10.3)
Chloride: 105 mmol/L (ref 98–111)
Creatinine, Ser: 1.16 mg/dL (ref 0.61–1.24)
GFR, Estimated: >60 mL/min (ref >60)
Glucose, Bld: 110 mg/dL — ABNORMAL HIGH (ref 70–99)
Potassium: 3.5 mmol/L (ref 3.5–5.1)
Sodium: 141 mmol/L (ref 135–145)
Total Bilirubin: 0.6 mg/dL (ref 0.3–1.2)
Total Protein: 7 g/dL (ref 6.5–8.1)

## 2021-01-30 MED ORDER — ONDANSETRON HCL 4 MG PO TABS: 4.0000 mg | ORAL_TABLET | Freq: Four times a day (QID) | ORAL | 0 refills | Status: DC

## 2021-01-30 MED ORDER — NAPROXEN 500 MG PO TABS: 500.0000 mg | ORAL_TABLET | Freq: Two times a day (BID) | ORAL | 0 refills | Status: DC

## 2021-01-30 MED ORDER — OXYCODONE-ACETAMINOPHEN 5-325 MG PO TABS
2.0000 | ORAL_TABLET | Freq: Once | ORAL | Status: AC
  Administered 2021-01-30: 18:00:00 2 via ORAL
  Filled 2021-01-30: qty 2

## 2021-01-30 MED ORDER — ONDANSETRON 4 MG PO TBDP
4.0000 mg | ORAL_TABLET | Freq: Once | ORAL | Status: AC
  Administered 2021-01-30: 18:00:00 4 mg via ORAL
  Filled 2021-01-30: qty 1

## 2021-01-30 NOTE — ED Provider Notes (Addendum)
[facility] EMERGENCY DEPARTMENT Provider Note   CSN: [id number] Arrival date & time: 01/30/21  1506     History Chief Complaint  Patient presents with   Seizures    Mathew Moore is a 55 y.o. male.   Seizures  This patient is a 55 year old male, he has a history of hypertension as well as psychogenic nonepileptic seizures, he was diagnosed with this approximately in April of this year and since that time has had multiple syncopal events, multiple episodes of seizure-like activity for which I personally saw him in November approximately 1-1/2 months ago and witnessed this nonepileptic seizure activity which was most likely nonepileptic.  He presents today after having a fall at home, states he was in the bathroom when he passed out and struck the right side of his face on a sharp corner of a cabinet as he fell to the ground.  They were going to go to the urgent care to have them look at this abrasion laceration to his right cheek but he had seizure-like activity in the car a couple of times, return to his baseline very quickly.  He has had his baseline right now according to his significant other at the bedside.  No urinary incontinence, no tongue biting, he does have a headache.  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   Pschogenic nonepileptic, last one 11/28/20   Psychogenic nonepileptic seizure    Sarcoidosis 07/2019    Patient Active Problem List   Diagnosis Date Noted   MDD (major depressive disorder), single episode, severe , no psychosis ([place]) 01/20/2021   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   Sinusitis, chronic 06/11/2020   Deviated septum 06/11/2020   Nasal turbinate hypertrophy 06/11/2020   Essential hypertension 05/31/2020   Observed seizure-like activity ([place])  05/30/2020   Syncope 05/14/2020   Hypokalemia 05/14/2020   Anxiety    Seizure-like activity ([place])    Sarcoidosis 08/10/2019   CAD (coronary artery disease) 07/2019   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   Lymphadenopathy, mediastinal 06/11/2019   Otorrhea, right 12/07/2018   Abnormal stress test    Chest pain    Encounter for screening colonoscopy 01/18/2018   Acute suppurative otitis media of left ear without spontaneous rupture of tympanic membrane 09/07/2017   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/22/2016   Eustachian tube dysfunction, bilateral 07/29/2015   Myringotomy tube status 07/29/2015   Dyspnea 04/02/2013    Past Surgical History:  Procedure Laterality Date   BRONCHOSCOPY     CLEFT LIP REPAIR     COLONOSCOPY     HERNIA REPAIR Left    LEFT HEART CATH AND CORONARY ANGIOGRAPHY N/A 08/23/2018   Procedure: LEFT HEART CATH AND CORONARY ANGIOGRAPHY;  Surgeon: Burnell Blanks, MD;  Location: [facility];  Service: Cardiovascular;  Laterality: N/A;   MEDIASTINOSCOPY N/A 07/20/2019   Procedure: MEDIASTINOSCOPY;  Surgeon: Melrose Nakayama, MD;  Location: [facility];  Service: Thoracic;  Laterality: N/A;   NASAL SEPTOPLASTY W/ TURBINOPLASTY Bilateral 12/03/2020   Procedure: REVISION OF NASAL SEPTOPLASTY AND INFERIOR TURBINATE REDUCTION;  Surgeon: Jerrell Belfast, MD;  Location: [facility];  Service: ENT;  Laterality: Bilateral;   NASAL SEPTUM SURGERY     PLANTAR FASCIA SURGERY Right    SINUS  ENDO W/FUSION Bilateral 12/03/2020   Procedure: BILATERAL ENDOSCOPIC SINUS SURGERY WITH NAVIGATION;  Surgeon: Jerrell Belfast, MD;  Location: [facility];  Service: ENT;  Laterality: Bilateral;   VIDEO BRONCHOSCOPY WITH ENDOBRONCHIAL ULTRASOUND N/A 07/06/2019   Procedure: VIDEO BRONCHOSCOPY WITH ENDOBRONCHIAL ULTRASOUND;  Surgeon: Marshell Garfinkel, MD;  Location: [facility];  Service: Pulmonary;  Laterality: N/A;       Family History  Problem Relation Age of Onset    Breast cancer Mother    Heart disease Father    Lung cancer Father    Heart attack Father     Social History   Tobacco Use   Smoking status: Never   Smokeless tobacco: Never  Vaping Use   Vaping Use: Never used  Substance Use Topics   Alcohol use: Never   Drug use: Never    Home Medications Prior to Admission medications   Medication Sig Start Date End Date Taking? Authorizing Provider  atorvastatin (LIPITOR) 40 MG tablet Take 1 tablet (40 mg total) by mouth daily. For high cholesterol 01/23/21   Lindell Spar I, NP  clonazePAM (KLONOPIN) 0.5 MG tablet Take 1 tablet (0.5 mg total) by mouth daily. For anxiety 01/22/21   Lindell Spar I, NP  clonazePAM (KLONOPIN) 1 MG tablet Take 1 tablet (1 mg total) by mouth daily. For anxiety 01/23/21   Lindell Spar I, NP  FLUoxetine (PROZAC) 20 MG capsule Take 3 capsules (60 mg total) by mouth daily. For depression 01/23/21   Lindell Spar I, NP  lamoTRIgine (LAMICTAL) 150 MG tablet Take 1 tablet (150 mg total) by mouth at bedtime. For mood stabilization 01/22/21   Lindell Spar I, NP  losartan (COZAAR) 25 MG tablet Take 1 tablet (25 mg total) by mouth daily. For high blood pressure 01/23/21   Nwoko, Herbert Pun I, NP  QUEtiapine (SEROQUEL) 50 MG tablet Take 1 tablet (50 mg total) by mouth at bedtime. An adjunct to antidepressant 01/22/21   Lindell Spar I, NP  traZODone (DESYREL) 50 MG tablet Take 1 tablet (50 mg total) by mouth at bedtime as needed for sleep. 01/22/21   Encarnacion Slates, NP    Allergies    Patient has no known allergies.  Review of Systems   Review of Systems  Neurological:  Positive for seizures.  All other systems reviewed and are negative.  Physical Exam Updated Vital Signs BP 119/73    Pulse 79    Temp 98.8 F (37.1 C) (Oral)    Resp 17    Ht 1.727 m (5\' 8" )    Wt 93.9 kg    SpO2 96%    BMI 31.47 kg/m   Physical Exam Vitals and nursing note reviewed.  Constitutional:      General: He is not in acute distress.     Appearance: He is well-developed.  HENT:     Head: Normocephalic.     Comments: Approximately 4 cm laceration to the right cheek which is extremely superficial barely going through the epidermis    Nose: Nose normal. No congestion or rhinorrhea.     Mouth/Throat:     Mouth: Mucous membranes are moist.     Pharynx: No oropharyngeal exudate.  Eyes:     General: No scleral icterus.       Right eye: No discharge.        Left eye: No discharge.     Conjunctiva/sclera: Conjunctivae normal.     Pupils: Pupils are equal, round, and reactive to light.  Neck:  Thyroid: No thyromegaly.     Vascular: No JVD.  Cardiovascular:     Rate and Rhythm: Normal rate and regular rhythm.     Heart sounds: Normal heart sounds. No murmur heard.   No friction rub. No gallop.  Pulmonary:     Effort: Pulmonary effort is normal. No respiratory distress.     Breath sounds: Normal breath sounds. No wheezing or rales.  Abdominal:     General: Bowel sounds are normal. There is no distension.     Palpations: Abdomen is soft. There is no mass.     Tenderness: There is no abdominal tenderness.  Musculoskeletal:        General: No tenderness. Normal range of motion.     Cervical back: Normal range of motion and neck supple.  Lymphadenopathy:     Cervical: No cervical adenopathy.  Skin:    General: Skin is warm and dry.     Findings: No erythema or rash.  Neurological:     Mental Status: He is alert.     Coordination: Coordination normal.     Comments: The patient is awake alert and able to move all 4 extremities, normal strength in all 4 extremities, normal coordination with his finger-nose-finger, normal strength and coordination of the legs, cranial nerves III through XII are normal, he is alert and oriented to his location and the date objects and following commands with exactness.  Psychiatric:        Behavior: Behavior normal.    ED Results / Procedures / Treatments   Labs (all labs ordered are  listed, but only abnormal results are displayed) Labs Reviewed  COMPREHENSIVE METABOLIC PANEL - Abnormal; Notable for the following components:      Result Value   Glucose, Bld 110 (*)    Calcium 8.8 (*)    All other components within normal limits  CBC WITH DIFFERENTIAL/PLATELET    EKG EKG Interpretation  Date/Time:  Friday January 30 2021 15:32:43 EST Ventricular Rate:  78 PR Interval:  169 QRS Duration: 87 QT Interval:  377 QTC Calculation: 430 R Axis:   -30 Text Interpretation: Sinus rhythm Left axis deviation Abnormal R-wave progression, early transition Confirmed by Noemi Chapel [phone]) on 01/30/2021 3:47:39 PM  Radiology CT Head Wo Contrast  Result Date: 01/30/2021 CLINICAL DATA:  Fall during seizure EXAM: CT HEAD WITHOUT CONTRAST TECHNIQUE: Contiguous axial images were obtained from the base of the skull through the vertex without intravenous contrast. COMPARISON:  01/01/2021 FINDINGS: Brain: There is no acute intracranial hemorrhage, mass effect, or edema. Gray-white differentiation is preserved. There is no extra-axial fluid collection. Ventricles and sulci are within normal limits in size and configuration. Vascular: No hyperdense vessel or unexpected calcification. Skull: Calvarium is unremarkable. Sinuses/Orbits: No acute finding. Other: None. IMPRESSION: No acute intracranial abnormality. Electronically Signed   By: Macy Mis M.D.   On: 01/30/2021 16:30   CT Cervical Spine Wo Contrast  Result Date: 01/30/2021 CLINICAL DATA:  Fall during seizure EXAM: CT CERVICAL SPINE WITHOUT CONTRAST TECHNIQUE: Multidetector CT imaging of the cervical spine was performed without intravenous contrast. Multiplanar CT image reconstructions were also generated. COMPARISON:  01/01/2021 FINDINGS: Alignment: Stable. Skull base and vertebrae: Stable vertebral body heights. No acute fracture. Soft tissues and spinal canal: No prevertebral fluid or swelling. No visible canal hematoma. Disc  levels: Degenerative changes primarily at C5-C6 and C6-C7 are stable with the short interval. Upper chest: Minimally included lung apices are clear. IMPRESSION: No acute cervical spine fracture. Electronically Signed  By: Macy Mis M.D.   On: 01/30/2021 16:33    Procedures .Marland KitchenLaceration Repair  Date/Time: 01/30/2021 6:25 PM Performed by: Noemi Chapel, MD Authorized by: Noemi Chapel, MD   Consent:    Consent obtained:  Verbal   Consent given by:  Patient   Risks discussed:  Infection, pain, need for additional repair, poor cosmetic result and poor wound healing   Alternatives discussed:  No treatment and delayed treatment Universal protocol:    Procedure explained and questions answered to patient or proxy's satisfaction: yes     Imaging studies available: yes     Required blood products, implants, devices, and special equipment available: yes     Site/side marked: yes     Immediately prior to procedure, a time out was called: yes     Patient identity confirmed:  Verbally with patient Laceration details:    Location:  Face   Length (cm):  4   Depth (mm):  1 Pre-procedure details:    Preparation:  Patient was prepped and draped in usual sterile fashion and imaging obtained to evaluate for foreign bodies Exploration:    Limited defect created (wound extended): no     Hemostasis achieved with:  Direct pressure   Wound exploration: wound explored through full range of motion and entire depth of wound visualized     Wound extent: no fascia violation noted, no foreign bodies/material noted, no muscle damage noted, no nerve damage noted, no tendon damage noted, no underlying fracture noted and no vascular damage noted   Treatment:    Area cleansed with:  Saline   Amount of cleaning:  Standard   Irrigation solution:  Sterile saline   Debridement:  None   Undermining:  None   Scar revision: no   Skin repair:    Repair method:  Tissue adhesive Approximation:    Approximation:   Close Repair type:    Repair type:  Simple Post-procedure details:    Dressing:  Antibiotic ointment and sterile dressing   Procedure completion:  Tolerated well, no immediate complications Comments:         Medications Ordered in ED Medications - No data to display  ED Course  I have reviewed the triage vital signs and the nursing notes.  Pertinent labs & imaging results that were available during my care of the patient were reviewed by me and considered in my medical decision making (see chart for details).    MDM Rules/Calculators/A&P                          This patient has had a traumatic injury to the head, it appears that it is more of a laceration to the cheek but he has a significant headache.  He has had a couple of seizures.  Though he reports that he has had multiple work-ups he does not have a definitive diagnosis as to why he is having these nonepileptic seizures.  He has had EEG performed at this hospital as well as at [facility] and the wife states that when he was having active seizure activity he had an EEG on, there is no epileptiform activity seen on that EEG.  This further reinforces that the patient is likely had a nonepileptic seizure after having a syncopal episode.  Will check an EKG and labs as well as a CT scan of the brain to make sure that there is no cause of the syncope as well as no  signs of trauma to the head  Pt and familiy agreeable.  He is currently on a cardiac monitor, my interpretation of the cardiac monitoring is that he is in a sinus rhythm.  NSR on monitor - labs unremarkabld  Personally viewed and interpreted the CT scan images of the brain and cervical spine, no cervical spine fractures or brain injury that is visible on CT scan  Labs without any leukocytosis anemia or significant electrolyte abnormality  Patient stable for discharge, he declines having his wounds repaired, I think this is reasonable given the significant  superficial nature  Prior to being discharged the patient did request Dermabond repair, this was done without difficulty   Final Clinical Impression(s) / ED Diagnoses Final diagnoses:  Pseudoseizure  Syncope, unspecified syncope type  Laceration of face without complication, initial encounter    Rx / DC Orders ED Discharge Orders     None        Noemi Chapel, MD 01/30/21 1717    Noemi Chapel, MD 01/30/21 [phone]

## 2021-01-30 NOTE — Discharge Instructions (Signed)
Your testing here reveals that you do not have any signs of bleeding or injury to your brain or the facial bones or your spinal bones of your neck.  Your blood work has been unremarkable.  We have applied a topical antibiotic ointment to your face, the cut was so superficial that it would really not benefit from stitches.  I would like for you to return to the emergency department for severe worsening pain swelling fevers pus or if you have recurrent passing out spells.  We have not seen a reason for you to pass out today.  Please be very careful with making sure that you are eating and drinking enough as well as taking time when you are changing position.  You should see your doctor within 48 hours for recheck, emergency department for worsening symptoms

## 2021-01-30 NOTE — ED Notes (Signed)
Pt report feeling nauseated EDP aware. Pt is alert and oriented, Neuros within defined limits aside from pt becoming drowsy at times

## 2021-01-30 NOTE — ED Notes (Signed)
Pt to CT

## 2021-01-30 NOTE — ED Triage Notes (Signed)
Pt brought in by wife with c/o seizure at home today with laceration to right side of face. Pt denies use of blood thinners. Dressing applied to face over laceration in triage due to bleeding. Pt doesn't recall the seizure but reports he hasn't missed any doses of his seizure meds. Wife reports that pt seized on the car ride to the ED.

## 2021-01-30 NOTE — ED Notes (Signed)
Seizure precautions in place - seizure pads set up, suction at bedside. Pt placed on cardiac monitor.

## 2021-01-30 NOTE — BH Assessment (Signed)
Care Management - Follow Up [facility] Discharges   Patient has been placed in an inpatient psychiatric hospital ([facility]) on 01-19-2021.

## 2021-02-06 ENCOUNTER — Emergency Department (HOSPITAL_COMMUNITY)
Admission: EM | Admit: 2021-02-06 | Discharge: 2021-02-07 | Disposition: A | Discharge status: Home / Self Care | Payer: 59 | Attending: Emergency Medicine | Admitting: Emergency Medicine

## 2021-02-06 ENCOUNTER — Other Ambulatory Visit: Payer: Self-pay

## 2021-02-06 ENCOUNTER — Encounter (HOSPITAL_COMMUNITY): Payer: Self-pay

## 2021-02-06 DIAGNOSIS — F445 Conversion disorder with seizures or convulsions: Secondary | ICD-10-CM

## 2021-02-06 DIAGNOSIS — I10 Essential (primary) hypertension: Secondary | ICD-10-CM | POA: Insufficient documentation

## 2021-02-06 DIAGNOSIS — Z79899 Other long term (current) drug therapy: Secondary | ICD-10-CM | POA: Insufficient documentation

## 2021-02-06 DIAGNOSIS — R569 Unspecified convulsions: Secondary | ICD-10-CM | POA: Insufficient documentation

## 2021-02-06 DIAGNOSIS — R0789 Other chest pain: Secondary | ICD-10-CM | POA: Insufficient documentation

## 2021-02-06 LAB — COMPREHENSIVE METABOLIC PANEL
ALT: 30 U/L (ref 0–44)
AST: 36 U/L (ref 15–41)
Albumin: 4.2 g/dL (ref 3.5–5.0)
Alkaline Phosphatase: 93 U/L (ref 38–126)
Anion gap: 7 (ref 5–15)
BUN: 18 mg/dL (ref 6–20)
CO2: 26 mmol/L (ref 22–32)
Calcium: 8.6 mg/dL — ABNORMAL LOW (ref 8.9–10.3)
Chloride: 107 mmol/L (ref 98–111)
Creatinine, Ser: 1.23 mg/dL (ref 0.61–1.24)
GFR, Estimated: >60 mL/min (ref >60)
Glucose, Bld: 95 mg/dL (ref 70–99)
Potassium: 3.6 mmol/L (ref 3.5–5.1)
Sodium: 140 mmol/L (ref 135–145)
Total Bilirubin: 0.7 mg/dL (ref 0.3–1.2)
Total Protein: 6.5 g/dL (ref 6.5–8.1)

## 2021-02-06 LAB — CBC WITH DIFFERENTIAL/PLATELET
Abs Immature Granulocytes: 0.02 10*3/uL (ref 0.00–0.07)
Basophils Absolute: 0.1 10*3/uL (ref 0.0–0.1)
Basophils Relative: 1 %
Eosinophils Absolute: 0.2 10*3/uL (ref 0.0–0.5)
Eosinophils Relative: 3 %
HCT: 40.8 % (ref 39.0–52.0)
Hemoglobin: 13.3 g/dL (ref 13.0–17.0)
Immature Granulocytes: 0 %
Lymphocytes Relative: 31 %
Lymphs Abs: 2.5 10*3/uL (ref 0.7–4.0)
MCH: 28.6 pg (ref 26.0–34.0)
MCHC: 32.6 g/dL (ref 30.0–36.0)
MCV: 87.7 fL (ref 80.0–100.0)
Monocytes Absolute: 0.4 10*3/uL (ref 0.1–1.0)
Monocytes Relative: 5 %
Neutro Abs: 4.9 10*3/uL (ref 1.7–7.7)
Neutrophils Relative %: 60 %
Platelets: 228 10*3/uL (ref 150–400)
RBC: 4.65 MIL/uL (ref 4.22–5.81)
RDW: 13.4 % (ref 11.5–15.5)
WBC: 8.1 10*3/uL (ref 4.0–10.5)
nRBC: 0 % (ref 0.0–0.2)

## 2021-02-06 NOTE — ED Notes (Signed)
No seizure activity since before arrival.

## 2021-02-06 NOTE — ED Triage Notes (Signed)
Rcems from home cc of seizure-like activity. Hx of pseudo-seizure. Upon arrival pt is alert and oriented. Talking appropriately. Ems did not see any activity.  Hypertensive with ems. All other vs stable.

## 2021-02-07 ENCOUNTER — Other Ambulatory Visit: Payer: Self-pay

## 2021-02-07 LAB — TROPONIN I (HIGH SENSITIVITY)
Troponin I (High Sensitivity): 3 ng/L (ref <18)
Troponin I (High Sensitivity): 3 ng/L (ref <18)

## 2021-02-07 MED ORDER — ASPIRIN 81 MG PO CHEW
324.0000 mg | CHEWABLE_TABLET | Freq: Once | ORAL | Status: AC
  Administered 2021-02-07: 324 mg via ORAL
  Filled 2021-02-07: qty 4

## 2021-02-07 MED ORDER — ALUM & MAG HYDROXIDE-SIMETH 200-200-20 MG/5ML PO SUSP
30.0000 mL | Freq: Once | ORAL | Status: AC
  Administered 2021-02-07: 30 mL via ORAL
  Filled 2021-02-07: qty 30

## 2021-02-07 MED ORDER — NITROGLYCERIN 0.4 MG SL SUBL
0.4000 mg | SUBLINGUAL_TABLET | SUBLINGUAL | Status: DC | PRN
  Administered 2021-02-07: 0.4 mg via SUBLINGUAL
  Filled 2021-02-07: qty 1

## 2021-02-07 MED ORDER — LORAZEPAM 1 MG PO TABS
1.0000 mg | ORAL_TABLET | Freq: Once | ORAL | Status: AC
  Administered 2021-02-07: 1 mg via ORAL
  Filled 2021-02-07: qty 1

## 2021-02-07 MED ORDER — IBUPROFEN 400 MG PO TABS
400.0000 mg | ORAL_TABLET | Freq: Once | ORAL | Status: AC
  Administered 2021-02-07: 400 mg via ORAL
  Filled 2021-02-07: qty 1

## 2021-02-07 MED ORDER — ACETAMINOPHEN 325 MG PO TABS
650.0000 mg | ORAL_TABLET | Freq: Once | ORAL | Status: AC
  Administered 2021-02-07: 650 mg via ORAL
  Filled 2021-02-07: qty 2

## 2021-02-07 NOTE — ED Provider Notes (Signed)
[facility] EMERGENCY DEPARTMENT Provider Note   CSN: [id number] Arrival date & time: 02/06/21  2101     History  Chief Complaint  Patient presents with   Seizures    Mathew Moore is a 56 y.o. male.  The history is provided by the patient.  Seizures He has history of hypertension, hyperlipidemia, pseudoseizures and comes in having had 3 pseudoseizures tonight.  During these episodes, he is unconscious and has total body shaking which will last from 1 to 3 minutes.  There is no tongue biting or incontinence.  He was concerned because his blood pressure was very high at home-185/100.  He is now complaining of a generalized headache and also some heaviness in his chest with radiation to his left arm.  There has been associated dyspnea and nausea but no diaphoresis.  He is a non-smoker and does not have any history of diabetes.  There is family history of premature coronary atherosclerosis.   Home Medications Prior to Admission medications   Medication Sig Start Date End Date Taking? Authorizing Provider  atorvastatin (LIPITOR) 40 MG tablet Take 1 tablet (40 mg total) by mouth daily. For high cholesterol 01/23/21   Lindell Spar I, NP  clonazePAM (KLONOPIN) 0.5 MG tablet Take 1 tablet (0.5 mg total) by mouth daily. For anxiety 01/22/21   Lindell Spar I, NP  clonazePAM (KLONOPIN) 1 MG tablet Take 1 tablet (1 mg total) by mouth daily. For anxiety 01/23/21   Lindell Spar I, NP  FLUoxetine (PROZAC) 20 MG capsule Take 3 capsules (60 mg total) by mouth daily. For depression 01/23/21   Lindell Spar I, NP  lamoTRIgine (LAMICTAL) 150 MG tablet Take 1 tablet (150 mg total) by mouth at bedtime. For mood stabilization 01/22/21   Lindell Spar I, NP  losartan (COZAAR) 25 MG tablet Take 1 tablet (25 mg total) by mouth daily. For high blood pressure 01/23/21   Lindell Spar I, NP  naproxen (NAPROSYN) 500 MG tablet Take 1 tablet (500 mg total) by mouth 2 (two) times daily with a meal. 01/30/21   Noemi Chapel, MD  ondansetron (ZOFRAN) 4 MG tablet Take 1 tablet (4 mg total) by mouth every 6 (six) hours. 01/30/21   Noemi Chapel, MD  QUEtiapine (SEROQUEL) 50 MG tablet Take 1 tablet (50 mg total) by mouth at bedtime. An adjunct to antidepressant 01/22/21   Lindell Spar I, NP  traZODone (DESYREL) 50 MG tablet Take 1 tablet (50 mg total) by mouth at bedtime as needed for sleep. 01/22/21   Encarnacion Slates, NP      Allergies    Patient has no known allergies.    Review of Systems   Review of Systems  Neurological:  Positive for seizures.  All other systems reviewed and are negative.  Physical Exam Updated Vital Signs BP (!) 159/105    Pulse 73    Temp 98.2 F (36.8 C)    Resp (!) 22    Ht 5\' 8"  (1.727 m)    Wt 93.9 kg    SpO2 95%    BMI 31.47 kg/m  Physical Exam Vitals and nursing note reviewed.  56 year old male, resting comfortably and in no acute distress. Vital signs are significant for elevated blood pressure and mildly elevated respiratory rate. Oxygen saturation is 95%, which is normal. Head is normocephalic and atraumatic. PERRLA, EOMI. Oropharynx is clear. Neck is nontender and supple without adenopathy or JVD. Back is nontender and there is no CVA tenderness. Lungs are  clear without rales, wheezes, or rhonchi. Chest is nontender. Heart has regular rate and rhythm without murmur. Abdomen is soft, flat, nontender. Extremities have no cyanosis or edema, full range of motion is present. Skin is warm and dry without rash. Neurologic: Mental status is normal, cranial nerves are intact, strength is 5/5 in all 4 extremities.  ED Results / Procedures / Treatments   Labs (all labs ordered are listed, but only abnormal results are displayed) Labs Reviewed  COMPREHENSIVE METABOLIC PANEL - Abnormal; Notable for the following components:      Result Value   Calcium 8.6 (*)    All other components within normal limits  CBC WITH DIFFERENTIAL/PLATELET  TROPONIN I (HIGH SENSITIVITY)   TROPONIN I (HIGH SENSITIVITY)    EKG EKG Interpretation  Date/Time:  Friday February 06 2021 21:12:00 EST Ventricular Rate:  63 PR Interval:  143 QRS Duration: 104 QT Interval:  398 QTC Calculation: 408 R Axis:   -31 Text Interpretation: Sinus rhythm Left axis deviation Abnormal R-wave progression, early transition When compared with ECG of 01/30/2021, No significant change was found Confirmed by Delora Fuel ([id number]) on 02/07/2021 12:41:30 AM   EKG Interpretation  Date/Time:  Saturday February 07 2021 00:21:43 EST Ventricular Rate:  61 PR Interval:  140 QRS Duration: 102 QT Interval:  411 QTC Calculation: 414 R Axis:   -33 Text Interpretation: Sinus rhythm Left axis deviation Abnormal R-wave progression, early transition When compared with ECG of 02/06/2021, No significant change was found Confirmed by Delora Fuel ([id number]) on 02/07/2021 12:43:06 AM       Procedures Procedures    Medications Ordered in ED Medications - No data to display  ED Course/ Medical Decision Making/ A&P                           Medical Decision Making  Pseudoseizures.  Old records are reviewed and he has had multiple ED visits for same complaint since April 2022.  He did have an inpatient EEG evaluation which confirmed diagnosis of pseudoseizures.  Chest pain which is likely related to pseudoseizure but is concerning for coronary artery disease.  We will need to check ECG and troponins.  He is given a dose of aspirin and nitroglycerin.  Headache, probably related to pseudoseizures, will treat with acetaminophen and ibuprofen.  ECGs are obtained x3 showing no ST or T changes.  Heart score is 3, which puts him at low risk for major adverse cardiac events in the next 6 weeks.  He had no relief of chest pain with nitroglycerin.  Headache has improved.  Initial troponin is normal.  He is given a dose of a GI cocktail.  Headache is markedly improved following GI cocktail, repeat troponin is normal as well.   He is currently on a maximum dose of omeprazole, advised to use over-the-counter antacids as needed.  Follow-up with PCP.        Final Clinical Impression(s) / ED Diagnoses Final diagnoses:  Psychogenic nonepileptic seizure  Atypical chest pain    Rx / DC Orders ED Discharge Orders     None         Delora Fuel, MD 22/48/25 [phone]

## 2021-02-09 ENCOUNTER — Encounter: Payer: Self-pay | Admitting: Cardiovascular Disease

## 2021-02-21 ENCOUNTER — Encounter (HOSPITAL_COMMUNITY): Payer: Self-pay | Admitting: *Deleted

## 2021-02-21 ENCOUNTER — Emergency Department (HOSPITAL_COMMUNITY)
Admission: EM | Admit: 2021-02-21 | Discharge: 2021-02-21 | Disposition: A | Discharge status: Home / Self Care | Payer: 59 | Attending: Emergency Medicine | Admitting: Emergency Medicine

## 2021-02-21 ENCOUNTER — Other Ambulatory Visit: Payer: Self-pay

## 2021-02-21 DIAGNOSIS — S0181XA Laceration without foreign body of other part of head, initial encounter: Secondary | ICD-10-CM | POA: Diagnosis not present

## 2021-02-21 DIAGNOSIS — Z79899 Other long term (current) drug therapy: Secondary | ICD-10-CM | POA: Diagnosis not present

## 2021-02-21 DIAGNOSIS — R569 Unspecified convulsions: Secondary | ICD-10-CM | POA: Insufficient documentation

## 2021-02-21 DIAGNOSIS — Y92009 Unspecified place in unspecified non-institutional (private) residence as the place of occurrence of the external cause: Secondary | ICD-10-CM | POA: Diagnosis not present

## 2021-02-21 DIAGNOSIS — W01190A Fall on same level from slipping, tripping and stumbling with subsequent striking against furniture, initial encounter: Secondary | ICD-10-CM | POA: Diagnosis not present

## 2021-02-21 DIAGNOSIS — W19XXXA Unspecified fall, initial encounter: Secondary | ICD-10-CM

## 2021-02-21 LAB — CBG MONITORING, ED: Glucose-Capillary: 111 mg/dL — ABNORMAL HIGH (ref 70–99)

## 2021-02-21 MED ORDER — ACETAMINOPHEN 325 MG PO TABS
650.0000 mg | ORAL_TABLET | Freq: Once | ORAL | Status: AC
Start: 2021-02-21 — End: 2021-02-21
  Administered 2021-02-21: 650 mg via ORAL
  Filled 2021-02-21: qty 2

## 2021-02-21 MED ORDER — LIDOCAINE-EPINEPHRINE 2 %-1:200000 IJ SOLN
10.0000 mL | Freq: Once | INTRAMUSCULAR | Status: AC
Start: 2021-02-21 — End: 2021-02-21
  Administered 2021-02-21: 10 mL
  Filled 2021-02-21: qty 20

## 2021-02-21 NOTE — ED Provider Notes (Signed)
[facility] EMERGENCY DEPARTMENT Provider Note   CSN: [id number] Arrival date & time: 02/21/21  2151     History  Chief Complaint  Patient presents with   Seizures    Lac to forehead    Mathew Moore is a 56 y.o. male.  56 year old male brought in by EMS from home, states he was going into a seizure when he fell and hit his head on the corner of the dresser and blacked out. Presents with laceration to the forehead, bleeding controlled with pressure. Not anticoagulated. No other injuries or concerns.       Home Medications Prior to Admission medications   Medication Sig Start Date End Date Taking? Authorizing Provider  atorvastatin (LIPITOR) 40 MG tablet Take 1 tablet (40 mg total) by mouth daily. For high cholesterol 01/23/21   Lindell Spar I, NP  clonazePAM (KLONOPIN) 0.5 MG tablet Take 1 tablet (0.5 mg total) by mouth daily. For anxiety 01/22/21   Lindell Spar I, NP  clonazePAM (KLONOPIN) 1 MG tablet Take 1 tablet (1 mg total) by mouth daily. For anxiety 01/23/21   Lindell Spar I, NP  FLUoxetine (PROZAC) 20 MG capsule Take 3 capsules (60 mg total) by mouth daily. For depression 01/23/21   Lindell Spar I, NP  lamoTRIgine (LAMICTAL) 150 MG tablet Take 1 tablet (150 mg total) by mouth at bedtime. For mood stabilization 01/22/21   Lindell Spar I, NP  losartan (COZAAR) 25 MG tablet Take 1 tablet (25 mg total) by mouth daily. For high blood pressure 01/23/21   Lindell Spar I, NP  naproxen (NAPROSYN) 500 MG tablet Take 1 tablet (500 mg total) by mouth 2 (two) times daily with a meal. 01/30/21   Noemi Chapel, MD  ondansetron (ZOFRAN) 4 MG tablet Take 1 tablet (4 mg total) by mouth every 6 (six) hours. 01/30/21   Noemi Chapel, MD  QUEtiapine (SEROQUEL) 50 MG tablet Take 1 tablet (50 mg total) by mouth at bedtime. An adjunct to antidepressant 01/22/21   Lindell Spar I, NP  traZODone (DESYREL) 50 MG tablet Take 1 tablet (50 mg total) by mouth at bedtime as needed for sleep. 01/22/21    Encarnacion Slates, NP      Allergies    Patient has no known allergies.    Review of Systems   Review of Systems  Constitutional:  Negative for fever.  Gastrointestinal:  Negative for nausea and vomiting.  Musculoskeletal:  Negative for back pain and neck pain.  Skin:  Positive for wound.  Neurological:  Positive for seizures.  Hematological:  Does not bruise/bleed easily.  Psychiatric/Behavioral:  Negative for confusion.   All other systems reviewed and are negative.  Physical Exam Updated Vital Signs BP 132/79    Pulse 67    Temp 97.9 F (36.6 C) (Oral)    Resp (!) 27    Ht 5\' 8"  (1.727 m)    Wt 93.9 kg    SpO2 99%    BMI 31.47 kg/m  Physical Exam Vitals and nursing note reviewed.  Constitutional:      General: He is not in acute distress.    Appearance: He is not ill-appearing.    HENT:     Head: Normocephalic.     Nose: Nose normal.     Mouth/Throat:     Mouth: Mucous membranes are moist.  Eyes:     Extraocular Movements: Extraocular movements intact.     Pupils: Pupils are equal, round, and reactive to light.  Cardiovascular:     Rate and Rhythm: Normal rate and regular rhythm.     Heart sounds: Normal heart sounds.  Pulmonary:     Effort: Pulmonary effort is normal.     Breath sounds: Normal breath sounds.  Abdominal:     Palpations: Abdomen is soft.     Tenderness: There is no abdominal tenderness.  Musculoskeletal:     Cervical back: Normal range of motion and neck supple. No tenderness.  Skin:    General: Skin is warm and dry.  Neurological:     Mental Status: He is alert and oriented to person, place, and time.  Psychiatric:        Behavior: Behavior normal.    ED Results / Procedures / Treatments   Labs (all labs ordered are listed, but only abnormal results are displayed) Labs Reviewed  CBG MONITORING, ED - Abnormal; Notable for the following components:      Result Value   Glucose-Capillary 111 (*)    All other components within normal limits     EKG None  Radiology No results found.  Procedures .Marland KitchenLaceration Repair  Date/Time: 02/21/2021 10:10 PM Performed by: Tacy Learn, PA-C Authorized by: Tacy Learn, PA-C   Consent:    Consent obtained:  Verbal   Consent given by:  Patient   Risks discussed:  Infection, need for additional repair, pain, poor cosmetic result and poor wound healing   Alternatives discussed:  No treatment and delayed treatment Universal protocol:    Procedure explained and questions answered to patient or proxy's satisfaction: yes     Relevant documents present and verified: yes     Test results available: yes     Imaging studies available: yes     Required blood products, implants, devices, and special equipment available: yes     Site/side marked: yes     Immediately prior to procedure, a time out was called: yes     Patient identity confirmed:  Verbally with patient Anesthesia:    Anesthesia method:  Local infiltration   Local anesthetic:  Lidocaine 2% WITH epi Laceration details:    Location:  Face   Face location:  Forehead   Length (cm):  2.5   Depth (mm):  3 Pre-procedure details:    Preparation:  Patient was prepped and draped in usual sterile fashion Exploration:    Hemostasis achieved with:  Epinephrine   Wound exploration: wound explored through full range of motion and entire depth of wound visualized     Contaminated: no   Treatment:    Area cleansed with:  Saline   Amount of cleaning:  Standard   Irrigation solution:  Sterile saline Skin repair:    Repair method:  Sutures   Suture size:  5-0   Suture material:  Prolene   Suture technique:  Simple interrupted   Number of sutures:  4 Approximation:    Approximation:  Close Repair type:    Repair type:  Simple Post-procedure details:    Dressing:  Open (no dressing)   Procedure completion:  Tolerated well, no immediate complications .Marland KitchenLaceration Repair  Date/Time: 02/21/2021 10:43 PM Performed by: Tacy Learn, PA-C Authorized by: Tacy Learn, PA-C   Consent:    Consent obtained:  Verbal   Consent given by:  Patient   Risks discussed:  Infection, need for additional repair, pain, poor cosmetic result and poor wound healing   Alternatives discussed:  No treatment and delayed treatment Universal protocol:  Procedure explained and questions answered to patient or proxy's satisfaction: yes     Relevant documents present and verified: yes     Test results available: yes     Imaging studies available: yes     Required blood products, implants, devices, and special equipment available: yes     Site/side marked: yes     Immediately prior to procedure, a time out was called: yes     Patient identity confirmed:  Verbally with patient Anesthesia:    Anesthesia method:  Local infiltration   Local anesthetic:  Lidocaine 2% WITH epi Laceration details:    Length (cm):  2   Depth (mm):  2 Pre-procedure details:    Preparation:  Patient was prepped and draped in usual sterile fashion Exploration:    Hemostasis achieved with:  Epinephrine   Wound exploration: wound explored through full range of motion and entire depth of wound visualized     Wound extent: no foreign bodies/material noted and no muscle damage noted     Contaminated: no   Treatment:    Area cleansed with:  Saline   Amount of cleaning:  Standard   Irrigation solution:  Sterile saline Skin repair:    Repair method:  Sutures   Suture size:  5-0   Suture material:  Prolene   Suture technique:  Simple interrupted   Number of sutures:  3 Approximation:    Approximation:  Close Repair type:    Repair type:  Simple Post-procedure details:    Dressing:  Open (no dressing)   Procedure completion:  Tolerated well, no immediate complications    Medications Ordered in ED Medications  lidocaine-EPINEPHrine (XYLOCAINE W/EPI) 2 %-1:200000 (PF) injection 10 mL (10 mLs Infiltration Given by Other 02/21/21 2242)  acetaminophen  (TYLENOL) tablet 650 mg (650 mg Oral Given 02/21/21 2211)    ED Course/ Medical Decision Making/ A&P                           Medical Decision Making Risk OTC drugs. Prescription drug management.   56 year old male with history of seizure disorder, on medications with recurrent episodes. Reports similar episode tonight, wife assessed laceration to face at home and felt patient may need sutures which prompted ER visit tonight. Patient was found to have 2 lacerations to the left forehead which were evaluated, cleaned and closed without difficulty. No other injuries. Discussed with Dr. Sabra Heck, ER attending, patient to be dc to follow up with his care team. Recommend wound check in 2 days and suture removal in 5 days.         Final Clinical Impression(s) / ED Diagnoses Final diagnoses:  Fall, initial encounter  Laceration of multiple sites of face    Rx / DC Orders ED Discharge Orders     None         Roque Lias 02/21/21 2249    Noemi Chapel, MD 02/21/21 2259

## 2021-02-21 NOTE — ED Triage Notes (Signed)
Pt fell after a seizure today striking his head on corner of chest in the bedroom.   Pt states he has been taking his seizure medication as prescribed.  Pt c/o head and neck pain

## 2021-02-21 NOTE — ED Notes (Signed)
Pt with bleeding lac to left forehead.

## 2021-02-24 ENCOUNTER — Emergency Department (HOSPITAL_COMMUNITY)
Admission: EM | Admit: 2021-02-24 | Discharge: 2021-02-25 | Disposition: A | Discharge status: Home / Self Care | Payer: 59 | Attending: Emergency Medicine | Admitting: Emergency Medicine

## 2021-02-24 ENCOUNTER — Other Ambulatory Visit: Payer: Self-pay

## 2021-02-24 ENCOUNTER — Encounter (HOSPITAL_COMMUNITY): Payer: Self-pay | Admitting: Emergency Medicine

## 2021-02-24 ENCOUNTER — Emergency Department (HOSPITAL_COMMUNITY): Payer: 59

## 2021-02-24 DIAGNOSIS — R569 Unspecified convulsions: Secondary | ICD-10-CM | POA: Insufficient documentation

## 2021-02-24 DIAGNOSIS — W19XXXA Unspecified fall, initial encounter: Secondary | ICD-10-CM

## 2021-02-24 DIAGNOSIS — S0990XA Unspecified injury of head, initial encounter: Secondary | ICD-10-CM | POA: Diagnosis present

## 2021-02-24 DIAGNOSIS — I10 Essential (primary) hypertension: Secondary | ICD-10-CM | POA: Diagnosis not present

## 2021-02-24 DIAGNOSIS — W01198A Fall on same level from slipping, tripping and stumbling with subsequent striking against other object, initial encounter: Secondary | ICD-10-CM | POA: Diagnosis not present

## 2021-02-24 LAB — CBC WITH DIFFERENTIAL/PLATELET
Abs Immature Granulocytes: 0.01 10*3/uL (ref 0.00–0.07)
Basophils Absolute: 0.1 10*3/uL (ref 0.0–0.1)
Basophils Relative: 1 %
Eosinophils Absolute: 0.2 10*3/uL (ref 0.0–0.5)
Eosinophils Relative: 4 %
HCT: 38.8 % — ABNORMAL LOW (ref 39.0–52.0)
Hemoglobin: 12.3 g/dL — ABNORMAL LOW (ref 13.0–17.0)
Immature Granulocytes: 0 %
Lymphocytes Relative: 41 %
Lymphs Abs: 2.1 10*3/uL (ref 0.7–4.0)
MCH: 28.3 pg (ref 26.0–34.0)
MCHC: 31.7 g/dL (ref 30.0–36.0)
MCV: 89.4 fL (ref 80.0–100.0)
Monocytes Absolute: 0.4 10*3/uL (ref 0.1–1.0)
Monocytes Relative: 8 %
Neutro Abs: 2.3 10*3/uL (ref 1.7–7.7)
Neutrophils Relative %: 46 %
Platelets: 283 10*3/uL (ref 150–400)
RBC: 4.34 MIL/uL (ref 4.22–5.81)
RDW: 14 % (ref 11.5–15.5)
WBC: 5 10*3/uL (ref 4.0–10.5)
nRBC: 0 % (ref 0.0–0.2)

## 2021-02-24 LAB — BASIC METABOLIC PANEL
Anion gap: 8 (ref 5–15)
BUN: 16 mg/dL (ref 6–20)
CO2: 29 mmol/L (ref 22–32)
Calcium: 8.7 mg/dL — ABNORMAL LOW (ref 8.9–10.3)
Chloride: 102 mmol/L (ref 98–111)
Creatinine, Ser: 1.16 mg/dL (ref 0.61–1.24)
GFR, Estimated: >60 mL/min (ref >60)
Glucose, Bld: 95 mg/dL (ref 70–99)
Potassium: 3.3 mmol/L — ABNORMAL LOW (ref 3.5–5.1)
Sodium: 139 mmol/L (ref 135–145)

## 2021-02-24 NOTE — ED Provider Notes (Signed)
[facility] EMERGENCY DEPARTMENT Provider Note   CSN: [id number] Arrival date & time: 02/24/21  2237     History  Chief Complaint  Patient presents with   Mathew Moore is a 56 y.o. male.  HPI     This is a 56 year old male who presents with head injury.  Patient reports that he had a seizure earlier this evening and fell hitting his head.  He reports room spinning dizziness.  He states he has had seizures since April.  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  He is on lamotrigine.  No recent changes in medications.  Patient states his wife witnessed the seizure.  He is reporting significant headache.  No neck pain.  Home Medications Prior to Admission medications   Medication Sig Start Date End Date Taking? Authorizing Provider  atorvastatin (LIPITOR) 40 MG tablet Take 1 tablet (40 mg total) by mouth daily. For high cholesterol 01/23/21   Lindell Spar I, NP  clonazePAM (KLONOPIN) 0.5 MG tablet Take 1 tablet (0.5 mg total) by mouth daily. For anxiety 01/22/21   Lindell Spar I, NP  clonazePAM (KLONOPIN) 1 MG tablet Take 1 tablet (1 mg total) by mouth daily. For anxiety 01/23/21   Lindell Spar I, NP  FLUoxetine (PROZAC) 20 MG capsule Take 3 capsules (60 mg total) by mouth daily. For depression 01/23/21   Lindell Spar I, NP  lamoTRIgine (LAMICTAL) 150 MG tablet Take 1 tablet (150 mg total) by mouth at bedtime. For mood stabilization 01/22/21   Lindell Spar I, NP  losartan (COZAAR) 25 MG tablet Take 1 tablet (25 mg total) by mouth daily. For high blood pressure 01/23/21   Lindell Spar I, NP  naproxen (NAPROSYN) 500 MG tablet Take 1 tablet (500 mg total) by mouth 2 (two) times daily with a meal. 01/30/21   Noemi Chapel, MD  ondansetron (ZOFRAN) 4 MG tablet Take 1 tablet (4 mg total) by mouth every 6 (six) hours. 01/30/21   Noemi Chapel, MD  QUEtiapine (SEROQUEL) 50 MG tablet Take 1 tablet (50 mg total) by mouth at bedtime. An adjunct to antidepressant  01/22/21   Lindell Spar I, NP  traZODone (DESYREL) 50 MG tablet Take 1 tablet (50 mg total) by mouth at bedtime as needed for sleep. 01/22/21   Encarnacion Slates, NP      Allergies    Patient has no known allergies.    Review of Systems   Review of Systems  Constitutional:  Negative for fever.  Neurological:  Positive for seizures and headaches.  All other systems reviewed and are negative.  Physical Exam Updated Vital Signs BP (!) 141/90    Pulse 64    Temp 97.7 F (36.5 C) (Oral)    Resp (!) 21    Ht 1.727 m (5\' 8" )    Wt 93.9 kg    SpO2 100%    BMI 31.48 kg/m  Physical Exam Vitals and nursing note reviewed.  Constitutional:      Appearance: He is well-developed. He is not ill-appearing.  HENT:     Head: Normocephalic.     Comments: Dried blood about the face, prior laceration repair left forehead with stitches intact, no obvious bleeding or dehiscence of wound    Nose: Nose normal.     Mouth/Throat:     Mouth: Mucous membranes are moist.  Eyes:     Pupils: Pupils are equal, round, and reactive to light.  Cardiovascular:     Rate  and Rhythm: Normal rate and regular rhythm.     Heart sounds: Normal heart sounds. No murmur heard. Pulmonary:     Effort: Pulmonary effort is normal. No respiratory distress.     Breath sounds: Normal breath sounds. No wheezing.  Abdominal:     General: Bowel sounds are normal.     Palpations: Abdomen is soft.     Tenderness: There is no abdominal tenderness. There is no rebound.  Musculoskeletal:     Cervical back: Neck supple.  Lymphadenopathy:     Cervical: No cervical adenopathy.  Skin:    General: Skin is warm and dry.  Neurological:     Mental Status: He is alert and oriented to person, place, and time.     Comments: Cranial nerves II through XII intact, 5 out of 5 strength in all 4 extremities, no dysmetria to finger-nose-finger    ED Results / Procedures / Treatments   Labs (all labs ordered are listed, but only abnormal results  are displayed) Labs Reviewed  CBC WITH DIFFERENTIAL/PLATELET - Abnormal; Notable for the following components:      Result Value   Hemoglobin 12.3 (*)    HCT 38.8 (*)    All other components within normal limits  BASIC METABOLIC PANEL - Abnormal; Notable for the following components:   Potassium 3.3 (*)    Calcium 8.7 (*)    All other components within normal limits    EKG EKG Interpretation  Date/Time:  Tuesday February 24 2021 22:44:45 EST Ventricular Rate:  66 PR Interval:  169 QRS Duration: 100 QT Interval:  381 QTC Calculation: 400 R Axis:   -46 Text Interpretation: Sinus rhythm LAD, consider left anterior fascicular block Low voltage, precordial leads RSR' in V1 or V2, right VCD or RVH Confirmed by Thayer Jew [phone]) on 02/25/2021 12:14:57 AM  Radiology CT Head Wo Contrast  Result Date: 02/24/2021 CLINICAL DATA:  Fall, seizure EXAM: CT HEAD WITHOUT CONTRAST TECHNIQUE: Contiguous axial images were obtained from the base of the skull through the vertex without intravenous contrast. RADIATION DOSE REDUCTION: This exam was performed according to the departmental dose-optimization program which includes automated exposure control, adjustment of the mA and/or kV according to patient size and/or use of iterative reconstruction technique. COMPARISON:  01/30/2021 FINDINGS: Brain: No acute intracranial abnormality. Specifically, no hemorrhage, hydrocephalus, mass lesion, acute infarction, or significant intracranial injury. Vascular: No hyperdense vessel or unexpected calcification. Skull: No acute calvarial abnormality. Sinuses/Orbits: No acute findings Other: None IMPRESSION: No acute intracranial abnormality. Electronically Signed   By: Rolm Baptise M.D.   On: 02/24/2021 23:47    Procedures Procedures    Medications Ordered in ED Medications - No data to display  ED Course/ Medical Decision Making/ A&P                           Medical Decision Making Amount and/or  Complexity of Data Reviewed Labs: ordered. Radiology: ordered.   This patient presents to the ED for concern of fall, seizure-like activity, this involves an extensive number of treatment options, and is a complaint that carries with it a high risk of complications and morbidity.  The differential diagnosis includes seizure, pseudoseizure, head injury, intracranial bleed  MDM:    This is a 56 year old male with a history of nonepileptic seizures who presents with seizure-like activity and possible head injury.  Was seen and evaluated on Saturday.  Had laceration repaired to the forehead.  Had a seizure-like  episode tonight witnessed by his wife.  Subsequently began bleeding from his prior wound.  He is nontoxic and vital signs are reassuring.  Reports dizziness and headache.  He is neurologically intact.  CT head obtained and shows no evidence of acute traumatic injury.  He is not actively bleeding from prior wound and it has not obviously dehisced.  Basic labs obtained and largely reassuring.  History of nonepileptic seizures.  Do not feel he needs adjustments in medications at this time.  He is he been hemodynamically stable.  Orthostatics are negative although patient does report dizziness upon standing. (Labs, imaging)  Labs: I Ordered, and personally interpreted labs.  The pertinent results include: CBC, BMP normal  Imaging Studies ordered: I ordered imaging studies including negative head CT I independently visualized and interpreted imaging. I agree with the radiologist interpretation  Additional history obtained from patient.  External records from outside source obtained and reviewed including prior records  Critical Interventions: None  Consultations: I requested consultation with the none,  and discussed lab and imaging findings as well as pertinent plan - they recommend: None  Cardiac Monitoring: The patient was maintained on a cardiac monitor.  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   I have reviewed the patients home medicines and have made adjustments as needed  Problem List / ED Course: Problem List Items Addressed This Visit       Other   Seizure-like activity ([place]) - Primary   Other Visit Diagnoses     Fall, initial encounter                       Final Clinical Impression(s) / ED Diagnoses Final diagnoses:  Seizure-like activity ([place])  Fall, initial encounter    Rx / DC Orders ED Discharge Orders     None         Damian Buckles, Barbette Hair, MD 02/25/21 0041

## 2021-02-24 NOTE — ED Triage Notes (Signed)
Pt had fall after seizure and reopened prior laceration repair.

## 2021-02-25 NOTE — Discharge Instructions (Signed)
You were seen today after seizure-like activity.  Your stitches remain in place.  You should not drive or operate heavy machinery.  Follow-up with your primary physician or neurologist regarding changes in medications.

## 2021-02-25 NOTE — ED Notes (Signed)
Pt felt dizzy and like the room was spinning while standing

## 2021-03-04 ENCOUNTER — Encounter (HOSPITAL_COMMUNITY): Payer: Self-pay | Admitting: Emergency Medicine

## 2021-03-04 ENCOUNTER — Emergency Department (HOSPITAL_COMMUNITY): Payer: 59

## 2021-03-04 ENCOUNTER — Other Ambulatory Visit: Payer: Self-pay

## 2021-03-04 ENCOUNTER — Emergency Department (HOSPITAL_COMMUNITY)
Admission: EM | Admit: 2021-03-04 | Discharge: 2021-03-04 | Disposition: A | Discharge status: Home / Self Care | Payer: 59 | Attending: Emergency Medicine | Admitting: Emergency Medicine

## 2021-03-04 DIAGNOSIS — R569 Unspecified convulsions: Secondary | ICD-10-CM | POA: Diagnosis not present

## 2021-03-04 DIAGNOSIS — W01190A Fall on same level from slipping, tripping and stumbling with subsequent striking against furniture, initial encounter: Secondary | ICD-10-CM | POA: Diagnosis not present

## 2021-03-04 DIAGNOSIS — S0181XA Laceration without foreign body of other part of head, initial encounter: Secondary | ICD-10-CM | POA: Insufficient documentation

## 2021-03-04 DIAGNOSIS — M542 Cervicalgia: Secondary | ICD-10-CM | POA: Diagnosis not present

## 2021-03-04 DIAGNOSIS — S0990XA Unspecified injury of head, initial encounter: Secondary | ICD-10-CM

## 2021-03-04 DIAGNOSIS — R0789 Other chest pain: Secondary | ICD-10-CM | POA: Insufficient documentation

## 2021-03-04 DIAGNOSIS — Y92009 Unspecified place in unspecified non-institutional (private) residence as the place of occurrence of the external cause: Secondary | ICD-10-CM | POA: Insufficient documentation

## 2021-03-04 DIAGNOSIS — I2510 Atherosclerotic heart disease of native coronary artery without angina pectoris: Secondary | ICD-10-CM | POA: Insufficient documentation

## 2021-03-04 LAB — COMPREHENSIVE METABOLIC PANEL
ALT: 29 U/L (ref 0–44)
AST: 27 U/L (ref 15–41)
Albumin: 4.4 g/dL (ref 3.5–5.0)
Alkaline Phosphatase: 89 U/L (ref 38–126)
Anion gap: 6 (ref 5–15)
BUN: 14 mg/dL (ref 6–20)
CO2: 27 mmol/L (ref 22–32)
Calcium: 9.1 mg/dL (ref 8.9–10.3)
Chloride: 107 mmol/L (ref 98–111)
Creatinine, Ser: 1.2 mg/dL (ref 0.61–1.24)
GFR, Estimated: >60 mL/min (ref >60)
Glucose, Bld: 92 mg/dL (ref 70–99)
Potassium: 4 mmol/L (ref 3.5–5.1)
Sodium: 140 mmol/L (ref 135–145)
Total Bilirubin: 0.9 mg/dL (ref 0.3–1.2)
Total Protein: 6.8 g/dL (ref 6.5–8.1)

## 2021-03-04 LAB — CBC WITH DIFFERENTIAL/PLATELET
Abs Immature Granulocytes: 0.01 10*3/uL (ref 0.00–0.07)
Basophils Absolute: 0.1 10*3/uL (ref 0.0–0.1)
Basophils Relative: 1 %
Eosinophils Absolute: 0.1 10*3/uL (ref 0.0–0.5)
Eosinophils Relative: 3 %
HCT: 39.4 % (ref 39.0–52.0)
Hemoglobin: 12.9 g/dL — ABNORMAL LOW (ref 13.0–17.0)
Immature Granulocytes: 0 %
Lymphocytes Relative: 26 %
Lymphs Abs: 1.4 10*3/uL (ref 0.7–4.0)
MCH: 29.4 pg (ref 26.0–34.0)
MCHC: 32.7 g/dL (ref 30.0–36.0)
MCV: 89.7 fL (ref 80.0–100.0)
Monocytes Absolute: 0.3 10*3/uL (ref 0.1–1.0)
Monocytes Relative: 7 %
Neutro Abs: 3.3 10*3/uL (ref 1.7–7.7)
Neutrophils Relative %: 63 %
Platelets: 278 10*3/uL (ref 150–400)
RBC: 4.39 MIL/uL (ref 4.22–5.81)
RDW: 14 % (ref 11.5–15.5)
WBC: 5.2 10*3/uL (ref 4.0–10.5)
nRBC: 0 % (ref 0.0–0.2)

## 2021-03-04 MED ORDER — DIPHENHYDRAMINE HCL 50 MG/ML IJ SOLN
12.5000 mg | Freq: Once | INTRAMUSCULAR | Status: AC
  Administered 2021-03-04: 12.5 mg via INTRAVENOUS
  Filled 2021-03-04: qty 1

## 2021-03-04 MED ORDER — PROCHLORPERAZINE EDISYLATE 10 MG/2ML IJ SOLN
10.0000 mg | Freq: Once | INTRAMUSCULAR | Status: AC
  Administered 2021-03-04: 10 mg via INTRAVENOUS
  Filled 2021-03-04: qty 2

## 2021-03-04 MED ORDER — LORAZEPAM 2 MG/ML IJ SOLN
INTRAMUSCULAR | Status: AC
  Filled 2021-03-04: qty 1

## 2021-03-04 MED ORDER — AMMONIA AROMATIC IN INHA
RESPIRATORY_TRACT | Status: AC
  Filled 2021-03-04: qty 10

## 2021-03-04 MED ORDER — POVIDONE-IODINE 10 % EX SOLN: CUTANEOUS | Status: DC | PRN

## 2021-03-04 MED ORDER — LIDOCAINE-EPINEPHRINE (PF) 1 %-1:200000 IJ SOLN
10.0000 mL | Freq: Once | INTRAMUSCULAR | Status: AC
  Administered 2021-03-04: 10 mL
  Filled 2021-03-04: qty 30

## 2021-03-04 MED ORDER — DEXAMETHASONE SODIUM PHOSPHATE 10 MG/ML IJ SOLN
10.0000 mg | Freq: Once | INTRAMUSCULAR | Status: AC
  Administered 2021-03-04: 10 mg via INTRAVENOUS
  Filled 2021-03-04: qty 1

## 2021-03-04 MED ORDER — KETOROLAC TROMETHAMINE 30 MG/ML IJ SOLN
15.0000 mg | Freq: Once | INTRAMUSCULAR | Status: AC
  Administered 2021-03-04: 15 mg via INTRAVENOUS
  Filled 2021-03-04: qty 1

## 2021-03-04 NOTE — ED Triage Notes (Signed)
Pt to the ED with EMS after having seizures at home. The patient fell and hit his head with a laceration described 3 inches by ems. Bleeding controlled.  Pt had another seizure in route and was given 2.5 of versed.

## 2021-03-04 NOTE — ED Notes (Signed)
Patient transported to CT 

## 2021-03-04 NOTE — ED Provider Notes (Signed)
[facility] EMERGENCY DEPARTMENT Provider Note   CSN: [id number] Arrival date & time: 03/04/21  1558     History  Chief Complaint  Patient presents with   Seizures    Mathew Moore is a 56 y.o. male with a history significant for psychogenic nonepileptic seizures under the care of [place] neurology, h/o TBI, CAD, sarcoidosis presenting for evaluation of head injury and forehead laceration after having a typical seizure like event.  He endorses no warning of impending "seizure" but woke to find he had fell against an interior door in his home causing head injury.  He reports severe headache which is typical after these episodes.  Also endorses mid cervical neck pain along with persistent left chest wall pain which started after a prior event involving fall several days ago (did not come for evaluation after that event). He denies sob, palpitations, n/v, focal weakness. He arrives per EMS, had versed 2.5 mg IV prior to arrival.  The history is provided by the patient.      Home Medications Prior to Admission medications   Medication Sig Start Date End Date Taking? Authorizing Provider  atorvastatin (LIPITOR) 40 MG tablet Take 1 tablet (40 mg total) by mouth daily. For high cholesterol 01/23/21  Yes Nwoko, Herbert Pun I, NP  Cholecalciferol (VITAMIN D-3) 25 MCG (1000 UT) CAPS Take 2,000 Units by mouth daily.   Yes [provider]  clonazePAM (KLONOPIN) 0.5 MG tablet Take 1 tablet (0.5 mg total) by mouth daily. For anxiety 01/22/21  Yes Lindell Spar I, NP  DULoxetine (CYMBALTA) 30 MG capsule Take 30 mg by mouth every morning. 02/11/21  Yes [provider]  lamoTRIgine (LAMICTAL) 150 MG tablet Take 1 tablet (150 mg total) by mouth at bedtime. For mood stabilization 01/22/21  Yes Nwoko, Herbert Pun I, NP  losartan (COZAAR) 25 MG tablet Take 1 tablet (25 mg total) by mouth daily. For high blood pressure 01/23/21  Yes Nwoko, Agnes I, NP  omeprazole (PRILOSEC) 40 MG capsule Take 40 mg by  mouth daily. 02/09/21  Yes [provider]  ondansetron (ZOFRAN) 4 MG tablet Take 1 tablet (4 mg total) by mouth every 6 (six) hours. 01/30/21  Yes Noemi Chapel, MD  QUEtiapine (SEROQUEL) 50 MG tablet Take 1 tablet (50 mg total) by mouth at bedtime. An adjunct to antidepressant 01/22/21  Yes Lindell Spar I, NP  testosterone cypionate (DEPOTESTOSTERONE CYPIONATE) 200 MG/ML injection Inject 200 mg into the muscle. Every 3 weeks 02/27/21  Yes [provider]  Topiramate (TOPAMAX PO) Take by mouth daily as needed (headache).   Yes [provider]  traZODone (DESYREL) 50 MG tablet Take 1 tablet (50 mg total) by mouth at bedtime as needed for sleep. 01/22/21  Yes Nwoko, Herbert Pun I, NP  clonazePAM (KLONOPIN) 1 MG tablet Take 1 tablet (1 mg total) by mouth daily. For anxiety Patient not taking: Reported on 03/04/2021 01/23/21   Lindell Spar I, NP  FLUoxetine (PROZAC) 20 MG capsule Take 3 capsules (60 mg total) by mouth daily. For depression Patient not taking: Reported on 03/04/2021 01/23/21   Lindell Spar I, NP  naproxen (NAPROSYN) 500 MG tablet Take 1 tablet (500 mg total) by mouth 2 (two) times daily with a meal. Patient not taking: Reported on 03/04/2021 01/30/21   Noemi Chapel, MD      Allergies    Patient has no known allergies.    Review of Systems   Review of Systems  Constitutional:  Negative for fever.  HENT:  Negative for congestion and sore throat.   Eyes: Negative.   Respiratory:  Negative for chest tightness and shortness of breath.   Cardiovascular:  Positive for chest pain.  Gastrointestinal:  Negative for abdominal pain and nausea.  Genitourinary: Negative.   Musculoskeletal:  Positive for neck pain. Negative for arthralgias and joint swelling.  Skin: Negative.  Negative for rash and wound.  Neurological:  Positive for seizures and headaches. Negative for dizziness, speech difficulty, weakness, light-headedness and numbness.  Psychiatric/Behavioral: Negative.      Physical Exam Updated Vital Signs BP 110/73    Pulse 62    Temp 97.9 F (36.6 C)    Resp 19    Ht 5\' 8"  (1.727 m)    Wt 93.9 kg    SpO2 94%    BMI 31.48 kg/m  Physical Exam Vitals and nursing note reviewed.  Constitutional:      Appearance: He is well-developed.     Comments: Upon first entering room, pt supine with mild myoclonic jerking of arms and head, eyes closed, but when asked if he can hear me, he responds with "yes" and opens eyes briefly.  After a few more questions, jerking completely resolved.  HENT:     Head: Normocephalic. Laceration present. No raccoon eyes or Battle's sign.     Jaw: There is normal jaw occlusion.     Comments: 5 cm linear laceration right forehead, hemostatic.     Right Ear: Hearing normal. No hemotympanum.     Left Ear: Hearing normal. No hemotympanum.     Nose: Nose normal. No nasal deformity.  Eyes:     General: Lids are normal.     Extraocular Movements: Extraocular movements intact.     Right eye: No nystagmus.     Left eye: No nystagmus.     Conjunctiva/sclera: Conjunctivae normal.  Cardiovascular:     Rate and Rhythm: Normal rate and regular rhythm.     Heart sounds: Normal heart sounds.  Pulmonary:     Effort: Pulmonary effort is normal.     Breath sounds: Normal breath sounds. No wheezing.  Chest:     Chest wall: Tenderness present.       Comments: Ttp left lateral chest wall. No deformity. Abdominal:     General: Bowel sounds are normal.     Palpations: Abdomen is soft.     Tenderness: There is no abdominal tenderness.  Musculoskeletal:        General: Normal range of motion.     Cervical back: Normal range of motion. Spinous process tenderness present.  Skin:    General: Skin is warm and dry.  Neurological:     General: No focal deficit present.     Mental Status: He is alert and oriented to person, place, and time.     Cranial Nerves: Cranial nerves 2-12 are intact.     Sensory: Sensation is intact.     Motor: Motor  function is intact.     Coordination: Coordination is intact. Coordination normal.    ED Results / Procedures / Treatments   Labs (all labs ordered are listed, but only abnormal results are displayed) Labs Reviewed  CBC WITH DIFFERENTIAL/PLATELET - Abnormal; Notable for the following components:      Result Value   Hemoglobin 12.9 (*)    All other components within normal limits  COMPREHENSIVE METABOLIC PANEL    EKG EKG Interpretation  Date/Time:  Wednesday March 04 2021 16:19:37 EST Ventricular Rate:  79 PR Interval:  168 QRS Duration: 82 QT Interval:  378 QTC Calculation: 434 R Axis:   -43 Text Interpretation: Sinus rhythm Abnormal R-wave progression, early transition Inferior infarct, old since last tracing no significant change Confirmed by Daleen Bo [phone]) on 03/04/2021 4:30:16 PM  Radiology DG Ribs Unilateral W/Chest Left  Result Date: 03/04/2021 CLINICAL DATA:  Left rib pain, fell, seizure EXAM: LEFT RIBS AND CHEST - 3+ VIEW COMPARISON:  09/11/2019 FINDINGS: Frontal view of the chest as well as frontal and oblique views of the left thoracic cage are obtained. Cardiac silhouette is unremarkable. No airspace disease, effusion, or pneumothorax. There are no acute displaced rib fractures. IMPRESSION: 1. No acute intrathoracic process.  No displaced rib fracture. Electronically Signed   By: Randa Ngo M.D.   On: 03/04/2021 17:37   CT Head Wo Contrast  Result Date: 03/04/2021 CLINICAL DATA:  Head trauma, moderate-severe; Neck trauma, midline tenderness (Age 55-64y) EXAM: CT HEAD WITHOUT CONTRAST CT CERVICAL SPINE WITHOUT CONTRAST TECHNIQUE: Multidetector CT imaging of the head and cervical spine was performed following the standard protocol without intravenous contrast. Multiplanar CT image reconstructions of the cervical spine were also generated. RADIATION DOSE REDUCTION: This exam was performed according to the departmental dose-optimization program which includes  automated exposure control, adjustment of the mA and/or kV according to patient size and/or use of iterative reconstruction technique. COMPARISON:  None. FINDINGS: CT HEAD FINDINGS Brain: No evidence of acute infarction, hemorrhage, hydrocephalus, extra-axial collection or mass lesion/mass effect. Vascular: No hyperdense vessel identified. Skull: No acute fracture. Sinuses/Orbits: Very mild paranasal sinus mucosal thickening. Unremarkable orbits. Other: No mastoid effusions. CT CERVICAL SPINE FINDINGS Alignment: Slight anterolisthesis of C4 on C5, unchanged. No new sagittal subluxation. Skull base and vertebrae: Vertebral body heights are maintained. No evidence of acute fracture. Soft tissues and spinal canal: No prevertebral fluid or swelling. No visible canal hematoma. Disc levels: Similar moderate degenerative disease at C5-C6 where there is disc height loss, endplate sclerosis and posterior disc osteophyte complex. Upper chest: Visualized lung apices are clear. IMPRESSION: 1. No evidence of acute intracranial abnormality. 2. No evidence of acute fracture or traumatic malalignment in the cervical spine. Electronically Signed   By: Margaretha Sheffield M.D.   On: 03/04/2021 17:29   CT Cervical Spine Wo Contrast  Result Date: 03/04/2021 CLINICAL DATA:  Head trauma, moderate-severe; Neck trauma, midline tenderness (Age 55-64y) EXAM: CT HEAD WITHOUT CONTRAST CT CERVICAL SPINE WITHOUT CONTRAST TECHNIQUE: Multidetector CT imaging of the head and cervical spine was performed following the standard protocol without intravenous contrast. Multiplanar CT image reconstructions of the cervical spine were also generated. RADIATION DOSE REDUCTION: This exam was performed according to the departmental dose-optimization program which includes automated exposure control, adjustment of the mA and/or kV according to patient size and/or use of iterative reconstruction technique. COMPARISON:  None. FINDINGS: CT HEAD FINDINGS Brain:  No evidence of acute infarction, hemorrhage, hydrocephalus, extra-axial collection or mass lesion/mass effect. Vascular: No hyperdense vessel identified. Skull: No acute fracture. Sinuses/Orbits: Very mild paranasal sinus mucosal thickening. Unremarkable orbits. Other: No mastoid effusions. CT CERVICAL SPINE FINDINGS Alignment: Slight anterolisthesis of C4 on C5, unchanged. No new sagittal subluxation. Skull base and vertebrae: Vertebral body heights are maintained. No evidence of acute fracture. Soft tissues and spinal canal: No prevertebral fluid or swelling. No visible canal hematoma. Disc levels: Similar moderate degenerative disease at C5-C6 where there is disc height loss, endplate sclerosis and posterior disc osteophyte complex. Upper chest: Visualized lung apices are clear. IMPRESSION: 1. No evidence of  acute intracranial abnormality. 2. No evidence of acute fracture or traumatic malalignment in the cervical spine. Electronically Signed   By: Margaretha Sheffield M.D.   On: 03/04/2021 17:29    Procedures Procedures    LACERATION REPAIR Performed by: Evalee Jefferson Authorized by: Evalee Jefferson Consent: Verbal consent obtained. Risks and benefits: risks, benefits and alternatives were discussed Consent given by: patient Patient identity confirmed: provided demographic data Prepped and Draped in normal sterile fashion Wound explored  Laceration Location: right forehead  Laceration Length: 5 cm  No Foreign Bodies seen or palpated  Anesthesia: local infiltration  Local anesthetic: lidocaine 1% with epinephrine  Anesthetic total: 3 ml  Irrigation method: syringe Amount of cleaning: standard  Skin closure: ethilon 5-0  Number of sutures: 7  Technique: simple interupted  Patient tolerance: Patient tolerated the procedure well with no immediate complications.   Medications Ordered in ED Medications  lidocaine-EPINEPHrine (PF) (XYLOCAINE-EPINEPHrine) 1 %-1:200000 (PF) injection 10 mL  (10 mLs Other Given 03/04/21 2100)  prochlorperazine (COMPAZINE) injection 10 mg (10 mg Intravenous Given 03/04/21 1911)  diphenhydrAMINE (BENADRYL) injection 12.5 mg (12.5 mg Intravenous Given 03/04/21 1910)  dexamethasone (DECADRON) injection 10 mg (10 mg Intravenous Given 03/04/21 1911)  ketorolac (TORADOL) 30 MG/ML injection 15 mg (15 mg Intravenous Given 03/04/21 2127)    ED Course/ Medical Decision Making/ A&P                           Medical Decision Making Pt with history of nonepileptic seizures with similar event causing minor head injury and laceration.    Amount and/or Complexity of Data Reviewed Independent Historian: spouse    Details: Wife assisted with much of patients past medical hx External Data Reviewed: notes.    Details: Reviw of previous notes from Dr. Arlyn Leak with [facility], confirming pt's seizure diagnosis and prior work up Labs: ordered.    Details: cbc, cmet stable without acute findings Radiology: ordered and independent interpretation performed.    Details: CT head and c spine obtained. Pt was placed in Phili collar as precaution prior to imaging.  Head and c spine cleared with CT.  Collar removed.  also obtained left rib detail given complaint f persistent pain wtih prior seizure - neg for acute fx.  Risk OTC drugs. Prescription drug management. Decision regarding hospitalization. Risk Details: No indication for admission.  He does have close f/u with his pcp tomorrow and neurology 3rd week of Feb. Wound care instructions given. Return precautions outlined.           Final Clinical Impression(s) / ED Diagnoses Final diagnoses:  Seizure-like activity ([place])  Injury of head, initial encounter  Laceration of forehead, initial encounter    Rx / DC Orders ED Discharge Orders     None         Landis Martins 03/06/21 1431    Daleen Bo, MD 03/08/21 [phone]

## 2021-03-04 NOTE — Discharge Instructions (Addendum)
Your CT imaging and chest x-ray are negative for acute injuries today.  Keep your wound clean and dry, you may apply antibiotic ointment to the wound but this is not necessary.  Have your sutures removed in 7 days.  Keep your wound clean and dry,  Until a good scab forms - you may then wash gently twice daily with mild soap and water, but dry completely after.  Get rechecked for any sign of infection (redness,  Swelling,  Increased pain or drainage of purulent fluid).

## 2021-03-04 NOTE — ED Notes (Signed)
C-Collar placed on the patient.

## 2021-03-05 ENCOUNTER — Encounter: Payer: Self-pay | Admitting: Internal Medicine

## 2021-03-05 NOTE — Telephone Encounter (Signed)
Dr. Shearon Stalls, please see mychart message sent by pt's spouse and advise.

## 2021-03-12 ENCOUNTER — Emergency Department (HOSPITAL_COMMUNITY)
Admission: EM | Admit: 2021-03-12 | Discharge: 2021-03-12 | Disposition: A | Discharge status: Home / Self Care | Payer: 59 | Attending: Emergency Medicine | Admitting: Emergency Medicine

## 2021-03-12 ENCOUNTER — Encounter (HOSPITAL_COMMUNITY): Payer: Self-pay | Admitting: Emergency Medicine

## 2021-03-12 DIAGNOSIS — R42 Dizziness and giddiness: Secondary | ICD-10-CM | POA: Diagnosis not present

## 2021-03-12 DIAGNOSIS — R0789 Other chest pain: Secondary | ICD-10-CM | POA: Diagnosis not present

## 2021-03-12 DIAGNOSIS — R519 Headache, unspecified: Secondary | ICD-10-CM | POA: Insufficient documentation

## 2021-03-12 DIAGNOSIS — R1110 Vomiting, unspecified: Secondary | ICD-10-CM | POA: Insufficient documentation

## 2021-03-12 DIAGNOSIS — R569 Unspecified convulsions: Secondary | ICD-10-CM | POA: Insufficient documentation

## 2021-03-12 DIAGNOSIS — F445 Conversion disorder with seizures or convulsions: Secondary | ICD-10-CM

## 2021-03-12 NOTE — ED Triage Notes (Signed)
Pt arrives [facility] form home after having another pseudoseizure this afternoon. Pt presents with small bump on the back of his head.   Pt seen yesterday and had increase on home medications.

## 2021-03-12 NOTE — ED Provider Notes (Addendum)
[facility] EMERGENCY DEPARTMENT Provider Note   CSN: [id number] Arrival date & time: 03/12/21  2159     History  Chief Complaint  Patient presents with   Pseudoseizure    Mathew Moore is a 56 y.o. male with a chronic history of pseudoseizures presenting today for repeat seizures last few hours.  This began when he was sitting in the recliner and the patient's wife witnessed him have several seizures.  She then states that he tried to help him go to the bathroom and he vomited 1 time.  She then tried to help him back to the recliner and he fell forward and rolled onto his back where he continued to shake.  Wife states she witnessed him have 10 seizures in the last hour, in which he was hitting his head against the floor.  He was seen in the ED about a week ago for similar presentation.  She also endorses a newer prodrome of finger-tapping and/or headache before the seizures begin.  Patient and his wife state that accompanying dizziness has increased over the last 2 months and sometimes affects his ability to walk.    Patient's neurologist is Dr. Arlyn Leak in [facility].  He endorses either his neurologist or primary care had increased his lamotrigine from 150mg  to 200mg  and his Cymbalta from 30mg  to 60mg  since yesterday.  The history is provided by the patient, medical records and the spouse.      Home Medications Prior to Admission medications   Medication Sig Start Date End Date Taking? Authorizing Provider  atorvastatin (LIPITOR) 40 MG tablet Take 1 tablet (40 mg total) by mouth daily. For high cholesterol 01/23/21   Lindell Spar I, NP  Cholecalciferol (VITAMIN D-3) 25 MCG (1000 UT) CAPS Take 2,000 Units by mouth daily.    [provider]  clonazePAM (KLONOPIN) 0.5 MG tablet Take 1 tablet (0.5 mg total) by mouth daily. For anxiety 01/22/21   Lindell Spar I, NP  clonazePAM (KLONOPIN) 1 MG tablet Take 1 tablet (1 mg total) by mouth daily. For anxiety Patient not taking:  Reported on 03/04/2021 01/23/21   Lindell Spar I, NP  DULoxetine (CYMBALTA) 30 MG capsule Take 30 mg by mouth every morning. 02/11/21   [provider]  FLUoxetine (PROZAC) 20 MG capsule Take 3 capsules (60 mg total) by mouth daily. For depression Patient not taking: Reported on 03/04/2021 01/23/21   Lindell Spar I, NP  lamoTRIgine (LAMICTAL) 150 MG tablet Take 1 tablet (150 mg total) by mouth at bedtime. For mood stabilization 01/22/21   Lindell Spar I, NP  losartan (COZAAR) 25 MG tablet Take 1 tablet (25 mg total) by mouth daily. For high blood pressure 01/23/21   Lindell Spar I, NP  naproxen (NAPROSYN) 500 MG tablet Take 1 tablet (500 mg total) by mouth 2 (two) times daily with a meal. Patient not taking: Reported on 03/04/2021 01/30/21   Noemi Chapel, MD  omeprazole (PRILOSEC) 40 MG capsule Take 40 mg by mouth daily. 02/09/21   [provider]  ondansetron (ZOFRAN) 4 MG tablet Take 1 tablet (4 mg total) by mouth every 6 (six) hours. 01/30/21   Noemi Chapel, MD  QUEtiapine (SEROQUEL) 50 MG tablet Take 1 tablet (50 mg total) by mouth at bedtime. An adjunct to antidepressant 01/22/21   Lindell Spar I, NP  testosterone cypionate (DEPOTESTOSTERONE CYPIONATE) 200 MG/ML injection Inject 200 mg into the muscle. Every 3 weeks 02/27/21   [provider]  Topiramate (TOPAMAX PO) Take by  mouth daily as needed (headache).    [provider]  traZODone (DESYREL) 50 MG tablet Take 1 tablet (50 mg total) by mouth at bedtime as needed for sleep. 01/22/21   Encarnacion Slates, NP      Allergies    Patient has no known allergies.    Review of Systems   Review of Systems  Constitutional:  Negative for chills, diaphoresis and fever.  HENT:  Negative for congestion, ear pain, rhinorrhea and sore throat.   Eyes:  Negative for photophobia, pain and visual disturbance.  Respiratory:  Positive for chest tightness. Negative for cough and shortness of breath.   Cardiovascular:  Positive for  chest pain. Negative for palpitations.       Chest pain consistent with last visit - radiating towards left axilla  Gastrointestinal:  Positive for vomiting. Negative for abdominal pain, constipation and diarrhea.  Genitourinary:  Negative for decreased urine volume, difficulty urinating, dysuria, hematuria and urgency.  Musculoskeletal:  Negative for arthralgias and back pain.  Skin:  Negative for color change and rash.  Neurological:  Positive for dizziness and headaches. Negative for tremors, seizures, syncope, facial asymmetry, speech difficulty, weakness and numbness.  All other systems reviewed and are negative.  Physical Exam Updated Vital Signs BP 110/72    Pulse 78    Temp 98.8 F (37.1 C)    Resp 18    Ht 5\' 8"  (1.727 m)    Wt 93.9 kg    SpO2 98%    BMI 31.48 kg/m  Physical Exam Vitals and nursing note reviewed.  Constitutional:      General: He is not in acute distress.    Appearance: Normal appearance. He is well-developed. He is not ill-appearing or diaphoretic.  HENT:     Head: Normocephalic and atraumatic. No raccoon eyes, Battle's sign, abrasion, contusion, masses or laceration. Hair is normal.     Mouth/Throat:     Mouth: Mucous membranes are moist.     Pharynx: Oropharynx is clear.  Eyes:     General: Lids are normal. Vision grossly intact. Gaze aligned appropriately. No visual field deficit or scleral icterus.    Extraocular Movements: Extraocular movements intact.     Conjunctiva/sclera: Conjunctivae normal.     Visual Fields: Right eye visual fields normal and left eye visual fields normal.  Cardiovascular:     Rate and Rhythm: Normal rate and regular rhythm.     Pulses:          Radial pulses are 2+ on the right side and 2+ on the left side.     Heart sounds: Normal heart sounds. No murmur heard. Pulmonary:     Effort: Pulmonary effort is normal. No respiratory distress.     Breath sounds: Normal breath sounds.  Abdominal:     Palpations: Abdomen is soft.      Tenderness: There is no abdominal tenderness.  Musculoskeletal:        General: No swelling.     Right hand: Normal.     Cervical back: Full passive range of motion without pain and neck supple. No rigidity, tenderness or crepitus. No pain with movement.     Right lower leg: No edema.     Left lower leg: No edema.     Comments: 5/5 active and passive range of motion of all digits of hands bilaterally Upper extremities appear neurovascularly intact  Skin:    General: Skin is warm and dry.     Capillary Refill: Capillary refill  takes less than 2 seconds.     Coloration: Skin is not jaundiced or pale.  Neurological:     Mental Status: He is alert and oriented to person, place, and time.     GCS: GCS eye subscore is 4. GCS verbal subscore is 5. GCS motor subscore is 6.     Cranial Nerves: No dysarthria or facial asymmetry.     Sensory: Sensation is intact.  Psychiatric:        Mood and Affect: Mood normal.    ED Results / Procedures / Treatments   Labs (all labs ordered are listed, but only abnormal results are displayed) Labs Reviewed - No data to display  EKG None  Radiology No results found.  Procedures Procedures    Medications Ordered in ED Medications - No data to display  ED Course/ Medical Decision Making/ A&P                           Medical Decision Making Amount and/or Complexity of Data Reviewed Independent Historian: spouse External Data Reviewed: notes.   56 year old male presents to the ED for concern of repeat seizures.  This involves an extensive number of treatment options, and is a complaint that carries with it a high risk of complications and morbidity.  The differential diagnosis includes pseudoseizure, trauma, epilepsy  Comorbidities that complicate the patient evaluation include for neural hearing loss, coronary artery disease, major depressive disorder  Additional history obtained from internal/external records available via  epic  Interpretation: I considered ordering imaging studies of the head/neck, but due to many recent imaging studies and no known significant changes in physical exam, it is not indicated at this time  ED Course: Patient presented today with recurrent symptoms that have been escalating over the past six months.  No significant changes since visit about a week ago.  Physical exam not suspicious for acute neurological deficit, or obvious or concerning trauma.  Patient moves head freely on physical exam without obvious or endorsed pain.  He also demonstrates full range of motion and sensation to both hands and digits.  Chest pain is reproducible upon palpation and radiates underneath the axilla, which is not consistent with cardiac nature.  This is also been consistent for the last few weeks.  Patient endorses chest tightness, but is able to speak without difficulty and appears talkative on physical exam.  Lung sounds are also clear.  Patient has no pain with inspiration or expiration.  Patient seems to have an ongoing and recurrent history of pseudoseizures that do not appear to be well controlled.  His recent change in medications may or may not have something to do with this.    Overall, I am uncertain the exact etiology of the patient's symptoms.  However, I do not believe he is currently experiencing a medical, surgical, or psychiatric emergency at this time.  Recommend follow-up with neurology for better reevaluation and assessment.    Dispostion: I discussed the patient and their case with my attending, Dr. Sabra Heck, who agreed with the proposed treatment course.  After consideration of the diagnostic results and the patient's response to treatment, I feel that the patent would benefit from outpatient follow-up with neurology and his primary care.  Discussed course of treatment thoroughly with the patient and his wife, whom demonstrated understanding.  Patient in agreement and has no further  questions.         Final Clinical Impression(s) / ED Diagnoses Final  diagnoses:  Pseudoseizure    Rx / DC Orders ED Discharge Orders     None         Prince Rome, PA-C 29/57/47 [id number]    Prince Rome, PA-C 70/96/43 [id number]    Noemi Chapel, MD 03/13/21 [phone]

## 2021-03-12 NOTE — Discharge Instructions (Addendum)
Please follow-up with your neurologist within the next 24 to 48 hours.    Also follow-up with your primary care in the next week for reevaluation.  Return to the ED for new or worsening symptoms.

## 2021-03-19 NOTE — Progress Notes (Unsigned)
Cardiology Office Note    Date:  03/19/2021   ID:  EUGUNE Moore, DOB 06-19-1965, MRN [id number]  PCP:  Manon Hilding, MD  Cardiologist: Jenkins Rouge, MD    No chief complaint on file.    History of Present Illness:    Mathew Moore is a 56 y.o. male with past medical history of CAD (mild nonobstructive disease by cath in 08/2018), HTN and pulmonary sarcoidosis who presents to the office today for follow-up Cardiac MRI done  01/24/20 normal EF 63% no evidence of cardiac sarcoid   In the interim, he presented to [facility] ED on 05/07/2020 for evaluation of sudden nausea and chest discomfort while at work with associated syncope. He did report falling backwards and struck his head. Labs showed WBC 6.7, Hgb 14.2, platelets 316, Na+ 139, K+ 3.5 and creatinine 1.23. Initial and delta HS troponin values negative at 3 and D-dimer negative. CT Head showed no acute intracranial abnormalities. CTA chest with no evidence of a PE. EKG showed NSR, HR 98 with LAD and no acute ST abnormalities. He was monitored on telemetry and maintained NSR  Subsequent outpatient monitor 05/22/20 showed no significant arrhythmia   Complains of  memory fog. Was evaluated by his PCP for this and carotid dopplers were ordered along with him being referred to [facility] Neurology. MRI 05/14/20 head normal MRA intracranial and neck normal Subsequently diagnosed with seizures and placed on Keppra but this was changed to lamotrigine by neurology 06/02/20 EEG has not picked up elliptic activity Seen again in ER 08/17/20 for seizure like activity ? Psychogenic and referred to psychiatry ? Going to [facility] for 2 nd opinion   Seen in ED again 03/12/21 with pseudoseizures sitting in recliner with vomiting Also seen a week before this for same with forehead laceration  Seeing [facility] in [place] and had lamotrigine increased and Cymbalta increased Some musculoskeletal pains after seizures Illness preventing him from working and worried about  bankruptcy Getting SSDI  ECGls showed NSR with poor R wave progression and insignificant Q;s in 2,3,F which are old and don not represent old IMI present on ECG;s as far back as 2020   ***   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   Pschogenic nonepileptic, last one 11/28/20   Psychogenic nonepileptic seizure    Sarcoidosis 07/2019    Past Surgical History:  Procedure Laterality Date   BRONCHOSCOPY     CLEFT LIP REPAIR     COLONOSCOPY     HERNIA REPAIR Left    LEFT HEART CATH AND CORONARY ANGIOGRAPHY N/A 08/23/2018   Procedure: LEFT HEART CATH AND CORONARY ANGIOGRAPHY;  Surgeon: Burnell Blanks, MD;  Location: [facility];  Service: Cardiovascular;  Laterality: N/A;   MEDIASTINOSCOPY N/A 07/20/2019   Procedure: MEDIASTINOSCOPY;  Surgeon: Melrose Nakayama, MD;  Location: [facility];  Service: Thoracic;  Laterality: N/A;   NASAL SEPTOPLASTY W/ TURBINOPLASTY Bilateral 12/03/2020   Procedure: REVISION OF NASAL SEPTOPLASTY AND INFERIOR TURBINATE REDUCTION;  Surgeon: Jerrell Belfast, MD;  Location: [facility];  Service: ENT;  Laterality: Bilateral;   NASAL SEPTUM SURGERY     PLANTAR FASCIA SURGERY Right    SINUS ENDO W/FUSION Bilateral 12/03/2020   Procedure: BILATERAL ENDOSCOPIC SINUS SURGERY WITH NAVIGATION;  Surgeon: Jerrell Belfast,  MD;  Location: [facility];  Service: ENT;  Laterality: Bilateral;   VIDEO BRONCHOSCOPY WITH ENDOBRONCHIAL ULTRASOUND N/A 07/06/2019   Procedure: VIDEO BRONCHOSCOPY WITH ENDOBRONCHIAL ULTRASOUND;  Surgeon: Marshell Garfinkel, MD;  Location: [place] [facility];  Service: Pulmonary;  Laterality: N/A;    Current Medications: Outpatient Medications Prior to Visit  Medication Sig Dispense Refill   atorvastatin (LIPITOR) 40 MG tablet Take 1 tablet (40 mg total) by mouth daily. For  high cholesterol 30 tablet 0   Cholecalciferol (VITAMIN D-3) 25 MCG (1000 UT) CAPS Take 2,000 Units by mouth daily.     clonazePAM (KLONOPIN) 0.5 MG tablet Take 1 tablet (0.5 mg total) by mouth daily. For anxiety 5 tablet 0   clonazePAM (KLONOPIN) 1 MG tablet Take 1 tablet (1 mg total) by mouth daily. For anxiety (Patient not taking: Reported on 03/04/2021) 5 tablet 0   DULoxetine (CYMBALTA) 30 MG capsule Take 30 mg by mouth every morning.     FLUoxetine (PROZAC) 20 MG capsule Take 3 capsules (60 mg total) by mouth daily. For depression (Patient not taking: Reported on 03/04/2021) 90 capsule 0   lamoTRIgine (LAMICTAL) 150 MG tablet Take 1 tablet (150 mg total) by mouth at bedtime. For mood stabilization 30 tablet 0   losartan (COZAAR) 25 MG tablet Take 1 tablet (25 mg total) by mouth daily. For high blood pressure 30 tablet 0   naproxen (NAPROSYN) 500 MG tablet Take 1 tablet (500 mg total) by mouth 2 (two) times daily with a meal. (Patient not taking: Reported on 03/04/2021) 30 tablet 0   omeprazole (PRILOSEC) 40 MG capsule Take 40 mg by mouth daily.     ondansetron (ZOFRAN) 4 MG tablet Take 1 tablet (4 mg total) by mouth every 6 (six) hours. 12 tablet 0   QUEtiapine (SEROQUEL) 50 MG tablet Take 1 tablet (50 mg total) by mouth at bedtime. An adjunct to antidepressant 30 tablet 0   testosterone cypionate (DEPOTESTOSTERONE CYPIONATE) 200 MG/ML injection Inject 200 mg into the muscle. Every 3 weeks     Topiramate (TOPAMAX PO) Take by mouth daily as needed (headache).     traZODone (DESYREL) 50 MG tablet Take 1 tablet (50 mg total) by mouth at bedtime as needed for sleep. 30 tablet 0   No facility-administered medications prior to visit.     Allergies:   Patient has no known allergies.   Social History   Socioeconomic History   Marital status: Married    Spouse name: Economist of children: 2   Years of education: Not on file   Highest education level: Not on file  Occupational History    Not on file  Tobacco Use   Smoking status: Never   Smokeless tobacco: Never  Vaping Use   Vaping Use: Never used  Substance and Sexual Activity   Alcohol use: Never   Drug use: Never   Sexual activity: Yes    Birth control/protection: None  Other Topics Concern   Not on file  Social History Narrative   Lives with wife   Social Determinants of Health   Financial Resource Strain: Not on file  Food Insecurity: Not on file  Transportation Needs: Not on file  Physical Activity: Not on file  Stress: Not on file  Social Connections: Not on file     Family History:  The patient's family history includes Breast cancer in his mother; Heart disease in his father; Lung cancer in his father.   Review of Systems:   Please  see the history of present illness.     General:  No chills, fever, night sweats or weight changes. Positive for syncopal episode.  Cardiovascular:  No dyspnea on exertion, edema, orthopnea, paroxysmal nocturnal dyspnea. Positive for chest pain and palpitations.  Dermatological: No rash, lesions/masses Respiratory: No cough, dyspnea Urologic: No hematuria, dysuria Abdominal:   No nausea, vomiting, diarrhea, bright red blood per rectum, melena, or hematemesis Neurologic:  No visual changes, wkns. Positive for memory issues.   All other systems reviewed and are otherwise negative except as noted above.   Physical Exam:    VS:  There were no vitals taken for this visit.   General: Well developed, well nourished,male appearing in no acute distress. Head: Normocephalic, atraumatic. Neck: No carotid bruits. JVD not elevated.  Lungs: Respirations regular and unlabored, without wheezes or rales.  Heart: Regular rate and rhythm. No S3 or S4.  No murmur, no rubs, or gallops appreciated. Abdomen: Appears non-distended. No obvious abdominal masses. Msk:  Strength and tone appear normal for age. No obvious joint deformities or effusions. Extremities: No clubbing or  cyanosis. No lower extremity edema.  Distal pedal pulses are 2+ bilaterally. Neuro: Alert and oriented X 3. Moves all extremities spontaneously. No focal deficits noted. Psych:  Responds to questions appropriately with a normal affect. Skin: No rashes or lesions noted  Wt Readings from Last 3 Encounters:  03/12/21 207 lb 0.2 oz (93.9 kg)  03/04/21 207 lb 0.2 oz (93.9 kg)  02/24/21 207 lb 0.2 oz (93.9 kg)     Studies/Labs Reviewed:   EKG:  EKG is not ordered today. EKG from 05/07/2020 is reviewed which shows NSR, HR 98 with LAD and no acute ST abnormalities.  Recent Labs: 12/24/2020: Magnesium 2.1 01/19/2021: TSH 2.275 03/04/2021: ALT 29; BUN 14; Creatinine, Ser 1.20; Hemoglobin 12.9; Platelets 278; Potassium 4.0; Sodium 140   Lipid Panel    Component Value Date/Time   CHOL 136 01/19/2021 1239   TRIG 89 01/19/2021 1239   HDL 37 (L) 01/19/2021 1239   CHOLHDL 3.7 01/19/2021 1239   VLDL 18 01/19/2021 1239   LDLCALC 81 01/19/2021 1239    Additional studies/ records that were reviewed today include:   Cardiac Catheterization: 08/2018 Mid RCA lesion is 20% stenosed. Mid Cx lesion is 10% stenosed. Mid LAD lesion is 30% stenosed.   1. Mild non-obstructive CAD 2. Normal filling pressures   Recommendations: Medical management of mild CAD   Cardiac MRI: 01/2020 IMPRESSION: 1. Normal cardiac MRI   2.  No evidence of sarcoid   3.  Normal LV size and function EF 63%    Plan:   In order of problems listed above:  1. Syncope - Unclear etiology but he reports associated chest pain and palpitations at that time. Workup during Emergency Department evaluation was unrevealing as outlined above. F/U monitor 05/22/20 no significant arrhythmias average HR 74 bpm SVT only 8 beats He continues to have Multiple "pseudoseizure" episodes with no clear etiology Will refer to EP for ? ILR although I really doubt he is having arrhythmic syncope   2. Chest Pain - Occurred while at rest and  persisted until arrival in the ED and did not respond to SL NTG. HS Troponin values were negative and EKG was without acute ST changes. Given his reassuring work-up and minimal CAD by cath in 08/2018, would not plan for repeat ischemic evaluation at this time unless he has recurrent symptoms.  - Continue ASA 81mg  daily and Atorvastatin 40mg  daily.  3. HTN - Well controlled.  Continue current medications and low sodium Dash type diet.    4. Seizures:  ? Psychogenic Rx Keppra then Lamictal f/u [facility]  Understands he cannot drive until free of spells for 6 months F/U [facility]    Refer EP ? ILR     Medication Adjustments/Labs and Tests Ordered: Current medicines are reviewed at length with the patient today.  Concerns regarding medicines are outlined above.  Medication changes, Labs and Tests ordered today are listed in the Patient Instructions below. There are no Patient Instructions on file for this visit.   Signed, Jenkins Rouge, MD  03/19/2021 4:05 PM    [place] [address] [place], [place] [address] Phone: [phone] Fax: [phone]

## 2021-03-25 ENCOUNTER — Ambulatory Visit: Payer: 59 | Admitting: Cardiovascular Disease

## 2021-03-30 ENCOUNTER — Other Ambulatory Visit: Payer: Self-pay | Admitting: Internal Medicine

## 2021-04-16 ENCOUNTER — Emergency Department (HOSPITAL_COMMUNITY)
Admission: EM | Admit: 2021-04-16 | Discharge: 2021-04-17 | Disposition: A | Discharge status: Home / Self Care | Payer: 59 | Attending: Emergency Medicine | Admitting: Emergency Medicine

## 2021-04-16 ENCOUNTER — Encounter (HOSPITAL_COMMUNITY): Payer: Self-pay | Admitting: Emergency Medicine

## 2021-04-16 ENCOUNTER — Emergency Department (HOSPITAL_COMMUNITY): Payer: 59

## 2021-04-16 DIAGNOSIS — W228XXA Striking against or struck by other objects, initial encounter: Secondary | ICD-10-CM | POA: Insufficient documentation

## 2021-04-16 DIAGNOSIS — S6991XA Unspecified injury of right wrist, hand and finger(s), initial encounter: Secondary | ICD-10-CM | POA: Diagnosis present

## 2021-04-16 DIAGNOSIS — S60221A Contusion of right hand, initial encounter: Secondary | ICD-10-CM | POA: Diagnosis not present

## 2021-04-16 DIAGNOSIS — M79641 Pain in right hand: Secondary | ICD-10-CM | POA: Insufficient documentation

## 2021-04-16 MED ORDER — OXYCODONE-ACETAMINOPHEN 5-325 MG PO TABS
1.0000 | ORAL_TABLET | Freq: Once | ORAL | Status: AC
  Administered 2021-04-17: 1 via ORAL
  Filled 2021-04-16: qty 1

## 2021-04-16 NOTE — ED Provider Notes (Signed)
?[facility] ?Provider Note ? ? ?CSN: [id number] ?Arrival date & time: 04/16/21  2233 ? ?  ? ?History ? ?Chief Complaint  ?Patient presents with  ? Pseudoseizure  ? ? ?[name] is a 56 y.o. male. ? ?Patient presents to the emergency department for evaluation of right wrist and hand injury.  Patient has a history of well-documented pseudoseizures.  He had a pseudoseizure tonight and struck his hand on an object.  Patient complaining of pain and swelling of the hand. ? ? ?  ? ?Home Medications ?Prior to Admission medications   ?Medication Sig Start Date End Date Taking? Authorizing Provider  ?atorvastatin (LIPITOR) 40 MG tablet Take 1 tablet (40 mg total) by mouth daily. For high cholesterol 01/23/21   Encarnacion Slates, NP  ?Cholecalciferol (VITAMIN D-3) 25 MCG (1000 UT) CAPS Take 2,000 Units by mouth daily.    [provider]  ?clonazePAM (KLONOPIN) 0.5 MG tablet Take 1 tablet (0.5 mg total) by mouth daily. For anxiety 01/22/21   Lindell Spar I, NP  ?clonazePAM (KLONOPIN) 1 MG tablet Take 1 tablet (1 mg total) by mouth daily. For anxiety ?Patient not taking: Reported on 03/04/2021 01/23/21   Lindell Spar I, NP  ?DULoxetine (CYMBALTA) 30 MG capsule Take 30 mg by mouth every morning. 02/11/21   [provider]  ?FLUoxetine (PROZAC) 20 MG capsule Take 3 capsules (60 mg total) by mouth daily. For depression ?Patient not taking: Reported on 03/04/2021 01/23/21   Lindell Spar I, NP  ?lamoTRIgine (LAMICTAL) 150 MG tablet Take 1 tablet (150 mg total) by mouth at bedtime. For mood stabilization 01/22/21   Lindell Spar I, NP  ?losartan (COZAAR) 25 MG tablet Take 1 tablet (25 mg total) by mouth daily. For high blood pressure 01/23/21   Lindell Spar I, NP  ?naproxen (NAPROSYN) 500 MG tablet Take 1 tablet (500 mg total) by mouth 2 (two) times daily with a meal. ?Patient not taking: Reported on 03/04/2021 01/30/21   Noemi Chapel, MD  ?omeprazole (PRILOSEC) 40 MG capsule TAKE 1 CAPSULE BY MOUTH  ONCE DAILY IN THE MORNING AND AT BEDTIME 03/31/21   Spero Geralds, MD  ?ondansetron (ZOFRAN) 4 MG tablet Take 1 tablet (4 mg total) by mouth every 6 (six) hours. 01/30/21   Noemi Chapel, MD  ?QUEtiapine (SEROQUEL) 50 MG tablet Take 1 tablet (50 mg total) by mouth at bedtime. An adjunct to antidepressant 01/22/21   Lindell Spar I, NP  ?testosterone cypionate (DEPOTESTOSTERONE CYPIONATE) 200 MG/ML injection Inject 200 mg into the muscle. Every 3 weeks 02/27/21   [provider]  ?Topiramate (TOPAMAX PO) Take by mouth daily as needed (headache).    [provider]  ?traZODone (DESYREL) 50 MG tablet Take 1 tablet (50 mg total) by mouth at bedtime as needed for sleep. 01/22/21   Encarnacion Slates, NP  ?   ? ?Allergies    ?Patient has no known allergies.   ? ?Review of Systems   ?Review of Systems  ?Musculoskeletal:  Positive for arthralgias.  ? ?Physical Exam ?Updated Vital Signs ?BP 135/82   Pulse 73   Temp 97.7 ?F (36.5 ?C) (Oral)   Resp 17   Ht '5\' 8"'$  (1.727 m)   Wt 93.9 kg   SpO2 97%   BMI 31.48 kg/m?  ?Physical Exam ?Vitals and nursing note reviewed.  ?Constitutional:   ?   General: He is not in acute distress. ?   Appearance: He is well-developed.  ?HENT:  ?  Head: Normocephalic and atraumatic.  ?   Mouth/Throat:  ?   Mouth: Mucous membranes are moist.  ?Eyes:  ?   General: Vision grossly intact. Gaze aligned appropriately.  ?   Extraocular Movements: Extraocular movements intact.  ?   Conjunctiva/sclera: Conjunctivae normal.  ?Cardiovascular:  ?   Rate and Rhythm: Normal rate and regular rhythm.  ?   Pulses: Normal pulses.  ?   Heart sounds: Normal heart sounds, S1 normal and S2 normal. No murmur heard. ?  No friction rub. No gallop.  ?Pulmonary:  ?   Effort: Pulmonary effort is normal. No respiratory distress.  ?   Breath sounds: Normal breath sounds.  ?Abdominal:  ?   Palpations: Abdomen is soft.  ?   Tenderness: There is no abdominal tenderness. There is no guarding or rebound.  ?    Hernia: No hernia is present.  ?Musculoskeletal:     ?   General: No swelling.  ?   Right wrist: Tenderness present. No swelling, deformity or snuff box tenderness. Normal range of motion.  ?   Right hand: Swelling and tenderness present. No deformity. Decreased range of motion.  ?   Cervical back: Full passive range of motion without pain, normal range of motion and neck supple. No pain with movement, spinous process tenderness or muscular tenderness. Normal range of motion.  ?   Right lower leg: No edema.  ?   Left lower leg: No edema.  ?   Comments: diffuse swelling over the dorsal aspect of the right hand without obvious deformity  ?Skin: ?   General: Skin is warm and dry.  ?   Capillary Refill: Capillary refill takes less than 2 seconds.  ?   Findings: No ecchymosis, erythema, lesion or wound.  ?Neurological:  ?   Mental Status: He is alert and oriented to person, place, and time.  ?   GCS: GCS eye subscore is 4. GCS verbal subscore is 5. GCS motor subscore is 6.  ?   Cranial Nerves: Cranial nerves 2-12 are intact.  ?   Sensory: Sensation is intact.  ?   Motor: Motor function is intact. No weakness or abnormal muscle tone.  ?   Coordination: Coordination is intact.  ?Psychiatric:     ?   Mood and Affect: Mood normal.     ?   Speech: Speech normal.     ?   Behavior: Behavior normal.  ? ? ?ED Results / Procedures / Treatments   ?Labs ?(all labs ordered are listed, but only abnormal results are displayed) ?Labs Reviewed - No data to display ? ?EKG ?None ? ?Radiology ?DG Wrist Complete Right ? ?Result Date: 04/16/2021 ?CLINICAL DATA:  Pain and swelling. EXAM: RIGHT HAND - COMPLETE 3+ VIEW; RIGHT WRIST - COMPLETE 3+ VIEW COMPARISON:  None. FINDINGS: Right hand: There is no acute fracture or dislocation. There is marked soft tissue swelling over the dorsum of the hand. There is no radiopaque foreign body. There is no cortical erosion or periosteal reaction. Joint spaces are well maintained. Right wrist: There is soft  tissue swelling surrounding the wrist. There is no acute fracture or dislocation. Joint spaces are maintained. IMPRESSION: 1. No acute bony abnormality of the right wrist or right hand. 2. Soft tissue swelling of the hand and wrist. Electronically Signed   By: Ronney Asters M.D.   On: 04/16/2021 23:12  ? ?DG Hand Complete Right ? ?Result Date: 04/16/2021 ?CLINICAL DATA:  Pain and swelling. EXAM: RIGHT HAND -  COMPLETE 3+ VIEW; RIGHT WRIST - COMPLETE 3+ VIEW COMPARISON:  None. FINDINGS: Right hand: There is no acute fracture or dislocation. There is marked soft tissue swelling over the dorsum of the hand. There is no radiopaque foreign body. There is no cortical erosion or periosteal reaction. Joint spaces are well maintained. Right wrist: There is soft tissue swelling surrounding the wrist. There is no acute fracture or dislocation. Joint spaces are maintained. IMPRESSION: 1. No acute bony abnormality of the right wrist or right hand. 2. Soft tissue swelling of the hand and wrist. Electronically Signed   By: Ronney Asters M.D.   On: 04/16/2021 23:12   ? ?Procedures ?Procedures  ? ? ?Medications Ordered in ED ?Medications - No data to display ? ?ED Course/ Medical Decision Making/ A&P ?  ?                        ?Medical Decision Making ?Amount and/or Complexity of Data Reviewed ?Radiology: ordered. ? ? ?Patient presents with predominantly hand injury. ? ? ? ? ? ? ? ? ?Differential diagnosis considered includes seizure, pseudoseizure, contusion, sprain, fracture ? ?Presents to the emergency department for evaluation of hand injury.  X-ray of hand and wrist are negative.  Patient with diffuse swelling consistent with a contusion.  Patient injured his hand during a pseudoseizure episode.  This has been well worked up in the past and does not require any further evaluation. ?Final Clinical Impression(s) / ED Diagnoses ?Final diagnoses:  ?Contusion of right hand, initial encounter  ? ? ?Rx / DC Orders ?ED Discharge Orders    ? ? None  ? ?  ? ? ?  ?Orpah Greek, MD ?04/16/21 2355 ? ?

## 2021-04-16 NOTE — ED Triage Notes (Signed)
Pt brought in [facility] from home after having pseudoseizure tonight. Pt c/o pain and swelling to right hand and wrist.  ?

## 2021-05-06 ENCOUNTER — Other Ambulatory Visit: Payer: Self-pay | Admitting: Internal Medicine

## 2021-06-10 ENCOUNTER — Ambulatory Visit: Payer: 59 | Admitting: Diagnostic Neuroimaging

## 2021-06-24 ENCOUNTER — Ambulatory Visit: Payer: 59 | Admitting: Physician Assistant

## 2021-06-27 NOTE — Progress Notes (Unsigned)
Cardiology Office Note:    Date:  06/30/2021   ID:  Mathew Moore, DOB [birth year], MRN [id number]  PCP:  Manon Hilding, MD   [facility] Cardiologist:  Jenkins Rouge, MD     Referring MD: Manon Hilding, MD   Chief Complaint: syncope  History of Present Illness:    Mathew Moore is a very pleasant 56 y.o. male with a hx of mild nonobstructive CAD, hypertension, syncope, and pulmonary sarcoidosis.  He was referred to cardiology and seen by Dr. Johnsie Cancel on 08/15/18 for evaluation of chest pain. He underwent cardiac catheterization 08/23/2018 that showed 20% stenosis mid RCA, 10% stenosis mid Cx, 30% stenosis mid LAD. Due to history of sarcoidosis and reported dyspnea, he had cardiac MRI 01/24/2020 revealed normal EF at 63%, no evidence of cardiac sarcoid.   He presented to [facility] ED on 05/07/2020 for sudden nausea and chest discomfort while at work with associated syncope.  He fell backwards and struck his head.  Troponins and D-dimer negative. CTA chest showed no evidence of PE. CT head showed no acute abnormalities. EKG showed NSR, heart rate 98 with LAD and no acute ST abnormalities.  Subsequent outpatient cardiac monitor showed no significant arrhythmia 05/22/20.  He reported mental fog and was referred to neurology.  He was diagnosed with seizures however subsequent EEG did not reveal epileptic activity.  He was seen again in ER 08/17/2020 for seizure-like activity and was referred to psychiatry. Was seeking a second opinion.   He was last seen in our office by Dr. Johnsie Cancel on 10/27/2020.  He was felt to be stable from a cardiac perspective and advised he could follow-up as needed.  Today, he is here with his wife for evaluation of syncope and collapse. Reports history of seizures x 1 year. Recently having sudden episodes of passing out.  He had prior episodes in April  2022 with associated chest pain and palpitations.  Follow-up monitor was unrevealing and he had no further episodes  until recently. Most recent episodes described as will start to feel weak, get sweaty, and often times will fall to the floor. Sometimes he does not good a warning.  Does not experience visual changes. No palpitations or chest pain prior to passing out. On one recent occasion he was able to hold onto the refrigerator and his wife caught him and got him into a chair. He feels better after sitting for approximately 10 minutes.  Recently has been more diligent about drinking water throughout the day. Has a history of hypokalemia which has been discovered during hospitalizations and he is given potassium supplement. Fell to his knees in the shower last night. Does not take hot showers. Cannot determine any consistency with episodes of syncope in regards to timing. BP well controlled. He denies chest pain, shortness of breath, lower extremity edema, fatigue, palpitations, melena, hematuria, hemoptysis, diaphoresis, weakness, orthopnea, and PND. Extensive lab work and imaging during recent admission at [facility].   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   Pschogenic nonepileptic, last one 11/28/20   Psychogenic nonepileptic seizure    Sarcoidosis 07/2019    Past Surgical History:  Procedure Laterality Date   BRONCHOSCOPY     CLEFT LIP REPAIR     COLONOSCOPY  HERNIA REPAIR Left    LEFT HEART CATH AND CORONARY ANGIOGRAPHY N/A 08/23/2018   Procedure: LEFT HEART CATH AND CORONARY ANGIOGRAPHY;  Surgeon: Burnell Blanks, MD;  Location: [facility];  Service: Cardiovascular;  Laterality: N/A;   MEDIASTINOSCOPY N/A 07/20/2019   Procedure: MEDIASTINOSCOPY;  Surgeon: Melrose Nakayama, MD;  Location: [facility];  Service: Thoracic;  Laterality: N/A;   NASAL SEPTOPLASTY W/ TURBINOPLASTY Bilateral  12/03/2020   Procedure: REVISION OF NASAL SEPTOPLASTY AND INFERIOR TURBINATE REDUCTION;  Surgeon: Jerrell Belfast, MD;  Location: [facility];  Service: ENT;  Laterality: Bilateral;   NASAL SEPTUM SURGERY     PLANTAR FASCIA SURGERY Right    SINUS ENDO W/FUSION Bilateral 12/03/2020   Procedure: BILATERAL ENDOSCOPIC SINUS SURGERY WITH NAVIGATION;  Surgeon: Jerrell Belfast, MD;  Location: [facility];  Service: ENT;  Laterality: Bilateral;   VIDEO BRONCHOSCOPY WITH ENDOBRONCHIAL ULTRASOUND N/A 07/06/2019   Procedure: VIDEO BRONCHOSCOPY WITH ENDOBRONCHIAL ULTRASOUND;  Surgeon: Marshell Garfinkel, MD;  Location: [facility];  Service: Pulmonary;  Laterality: N/A;    Current Medications: Current Meds  Medication Sig   albuterol (VENTOLIN HFA) 108 (90 Base) MCG/ACT inhaler Inhale into the lungs.   aspirin 81 MG chewable tablet Chew by mouth.   atorvastatin (LIPITOR) 40 MG tablet Take 1 tablet (40 mg total) by mouth daily. For high cholesterol   calcium carbonate (OS-CAL) 1250 (500 Ca) MG chewable tablet Chew by mouth.   Cholecalciferol (VITAMIN D-3) 25 MCG (1000 UT) CAPS Take 2,000 Units by mouth in the morning and at bedtime.   clonazePAM (KLONOPIN) 1 MG tablet Take 1 tablet (1 mg total) by mouth daily. For anxiety (Patient taking differently: Take 1 mg by mouth 2 (two) times daily. For anxiety)   DULoxetine (CYMBALTA) 30 MG capsule Take 30 mg by mouth every morning.   FLUoxetine (PROZAC) 20 MG capsule Take 3 capsules (60 mg total) by mouth daily. For depression   gabapentin (NEURONTIN) 300 MG capsule Take by mouth.   lamoTRIgine (LAMICTAL) 200 MG tablet Take by mouth daily.   losartan (COZAAR) 25 MG tablet Take 1 tablet (25 mg total) by mouth daily. For high blood pressure   methylPREDNISolone (MEDROL) 4 MG tablet See admin instructions. Taking as directed   naproxen (NAPROSYN) 500 MG tablet Take 1 tablet (500 mg total) by mouth 2 (two) times daily with a meal.   omeprazole (PRILOSEC) 40 MG capsule TAKE 1 CAPSULE BY  MOUTH ONCE DAILY IN THE MORNING AND AT BEDTIME   ondansetron (ZOFRAN) 4 MG tablet Take 1 tablet (4 mg total) by mouth every 6 (six) hours.   QUEtiapine (SEROQUEL) 50 MG tablet Take 1 tablet (50 mg total) by mouth at bedtime. An adjunct to antidepressant   testosterone cypionate (DEPOTESTOSTERONE CYPIONATE) 200 MG/ML injection Inject 200 mg into the muscle. Every 3 weeks   traZODone (DESYREL) 50 MG tablet Take 1 tablet (50 mg total) by mouth at bedtime as needed for sleep.     Allergies:   Patient has no known allergies.   Social History   Socioeconomic History   Marital status: Married    Spouse name: Economist of children: 2   Years of education: Not on file   Highest education level: Not on file  Occupational History   Not on file  Tobacco Use   Smoking status: Never   Smokeless tobacco: Never  Vaping Use   Vaping Use: Never used  Substance and Sexual Activity   Alcohol use: Never  Drug use: Never   Sexual activity: Yes    Birth control/protection: None  Other Topics Concern   Not on file  Social History Narrative   Lives with wife   Social Determinants of Health   Financial Resource Strain: Not on file  Food Insecurity: Not on file  Transportation Needs: Not on file  Physical Activity: Not on file  Stress: Not on file  Social Connections: Not on file     Family History: The patient's family history includes Breast cancer in his mother; Heart attack in his father; Heart disease in his father; Lung cancer in his father.  ROS:   Please see the history of present illness.  All other systems reviewed and are negative.  Labs/Other Studies Reviewed:    The following studies were reviewed today:  Cardiac monitor 05/26/20  Patient had a min HR of 52 bpm, max HR of 190 bpm, and avg HR of 74 bpm. Predominant underlying rhythm was Sinus Rhythm. 3 Supraventricular Tachycardia runs occurred, the run with the fastest interval lasting 6 beats with a max rate of 190  bpm, the  longest lasting 8 beats with an avg rate of 116 bpm. Isolated SVEs were rare (<1.0%), SVE Couplets were rare (<1.0%), and SVE Triplets were rare (<1.0%). Isolated VEs were rare (<1.0%), and no VE Couplets or VE Triplets were present.   Summary:  NSR averat HR 74 bpm limited SVT longest only 8 beats No significant arrhythmias to cause syncope   Echo 05/14/20  1. Left ventricular ejection fraction, by estimation, is 60 to 65%. The  left ventricle has normal function. The left ventricle has no regional  wall motion abnormalities. There is mild concentric left ventricular  hypertrophy. Left ventricular diastolic  parameters were normal.   2. Right ventricular systolic function is normal. The right ventricular  size is normal.   3. The mitral valve is normal in structure. Trivial mitral valve  regurgitation.   4. The aortic valve is tricuspid. Aortic valve regurgitation is not  visualized. No aortic stenosis is present.   5. The inferior vena cava is normal in size with greater than 50%  respiratory variability, suggesting right atrial pressure of 3 mmHg.   Comparison(s): No prior Echocardiogram.   Cardiac MR 01/24/20  IMPRESSION: 1. Normal cardiac MRI   2.  No evidence of sarcoid   3.  Normal LV size and function EF 63%    LHC 08/23/18  Mid RCA lesion is 20% stenosed. Mid Cx lesion is 10% stenosed. Mid LAD lesion is 30% stenosed.   1. Mild non-obstructive CAD 2. Normal filling pressures   Recommendations: Medical management of mild CAD  Recent Labs: Available in Care Everywhere   Recent Lipid Panel 03/03/2021 LDL 71, HDL 34, Trigs 99  Risk Assessment/Calculations:      Physical Exam:    VS:  BP 124/82   Pulse 74   Ht '5\' 8"'$  (1.727 m)   Wt 209 lb (94.8 kg)   SpO2 98%   BMI 31.78 kg/m     Wt Readings from Last 3 Encounters:  06/30/21 209 lb (94.8 kg)  04/16/21 207 lb 0.2 oz (93.9 kg)  03/12/21 207 lb 0.2 oz (93.9 kg)     GEN:  Well nourished, well  developed in no acute distress HEENT: Normal NECK: No JVD; No carotid bruits CARDIAC: RRR, no murmurs, rubs, gallops RESPIRATORY:  Clear to auscultation without rales, wheezing or rhonchi  ABDOMEN: Soft, non-tender, non-distended MUSCULOSKELETAL:  No edema; No  deformity. 2+ pedal pulses, equal bilaterally SKIN: Warm and dry NEUROLOGIC:  Alert and oriented x 3 PSYCHIATRIC:  Normal affect   EKG:  EKG is ordered today.  The ekg ordered today demonstrates NSR at 74 bpm, no acute change from previous  Diagnoses:    No diagnosis found. Assessment and Plan:     Syncope: Multiple recent episodes of syncope and pre-syncope.  Symptoms are not clearly consistent with vasovagal syncope.  He admits to occasionally skipping a midday meal, however he has been doing this for years and syncopal episodes do not consistently occur in the afternoon. History of seizures. 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  Normal carotids by CT 05/21/21. Reports good water consumption.  Has had his medications reviewed by PCP and pharmacist and avoids taking certain medications close together.  We will place a 30-day monitor for evaluation of arrhythmia that may be contributing.   Nonobstructive CAD: Has mild nonobstructive disease by cath July 2020.  Has occasional episodes of chest pain related to anxiety. Requested Rx for sublingual nitroglycerin since this was given to him by EMS in the past and improved his symptoms. I advised against it due to recent episodes of syncope. No indication for further ischemia evaluation at this times.  No bleeding problems on aspirin. Continue statin, aspirin, losartan.   Hypertension: BP is well-controlled. I have asked him to monitor closely at home and report if there are any extreme highs or lows, particularly in regards to episodes of syncope.   Hyperlipidemia LDL goal < 70: LDL 71 on 03/03/21. Continue atorvastatin.       Disposition: 3 months with Dr. Johnsie Cancel  Medication Adjustments/Labs and Tests Ordered: Current medicines are reviewed at length with the patient today.  Concerns regarding medicines are outlined above.  No orders of the defined types were placed in this encounter.  No orders of the defined types were placed in this encounter.   There are no Patient Instructions on file for this visit.   Signed, Emmaline Life, NP  06/30/2021 8:38 AM    [place]

## 2021-06-30 ENCOUNTER — Encounter (INDEPENDENT_AMBULATORY_CARE_PROVIDER_SITE_OTHER): Payer: 59

## 2021-06-30 ENCOUNTER — Ambulatory Visit (INDEPENDENT_AMBULATORY_CARE_PROVIDER_SITE_OTHER): Payer: 59 | Admitting: Nurse Practitioner

## 2021-06-30 ENCOUNTER — Encounter: Payer: Self-pay | Admitting: Internal Medicine

## 2021-06-30 ENCOUNTER — Encounter: Payer: Self-pay | Admitting: Nurse Practitioner

## 2021-06-30 VITALS — BP 124/82 | HR 74 | Ht 68.0 in | Wt 209.0 lb

## 2021-06-30 DIAGNOSIS — I10 Essential (primary) hypertension: Secondary | ICD-10-CM | POA: Diagnosis not present

## 2021-06-30 DIAGNOSIS — R55 Syncope and collapse: Secondary | ICD-10-CM

## 2021-06-30 DIAGNOSIS — E785 Hyperlipidemia, unspecified: Secondary | ICD-10-CM

## 2021-06-30 DIAGNOSIS — I2510 Atherosclerotic heart disease of native coronary artery without angina pectoris: Secondary | ICD-10-CM | POA: Diagnosis not present

## 2021-06-30 NOTE — Patient Instructions (Signed)
Medication Instructions:   Your physician recommends that you continue on your current medications as directed. Please refer to the Current Medication list given to you today.   *If you need a refill on your cardiac medications before your next appointment, please call your pharmacy*   Lab Work:  None ordered.  If you have labs (blood work) drawn today and your tests are completely normal, you will receive your results only by: [place] (if you have MyChart) OR A paper copy in the mail If you have any lab test that is abnormal or we need to change your treatment, we will call you to review the results.   Testing/Procedures:  [employer] Cardiac Event Monitor Instructions Your physician has requested you wear your cardiac event monitor for _____ days, (1-30). [employer] may call or text to confirm a shipping address. The monitor will be sent to a land address via UPS. [employer] will not ship a monitor to a PO BOX. It typically takes 3-5 days to receive your monitor after it has been enrolled. [employer] will assist with USPS tracking if your package is delayed. The telephone number for [employer] is [phone]. Once you have received your monitor, please review the enclosed instructions. Instruction tutorials can also be viewed under help and settings on the enclosed cell phone. Your monitor has already been registered assigning a specific monitor serial # to you.  Applying the monitor Remove cell phone from case and turn it on. The cell phone works as Dealer and needs to be within [employer] of you at all times. The cell phone will need to be charged on a daily basis. We recommend you plug the cell phone into the enclosed charger at your bedside table every night.  Monitor batteries: You will receive two monitor batteries labelled #1 and #2. These are your recorders. Plug battery #2 onto the second connection on the enclosed charger. Keep one battery on the  charger at all times. This will keep the monitor battery deactivated. It will also keep it fully charged for when you need to switch your monitor batteries. A small light will be blinking on the battery emblem when it is charging. The light on the battery emblem will remain on when the battery is fully charged.  Open package of a Monitor strip. Insert battery #1 into black hood on strip and gently squeeze monitor battery onto connection as indicated in instruction booklet. Set aside while preparing skin.  Choose location for your strip, vertical or horizontal, as indicated in the instruction booklet. Shave to remove all hair from location. There cannot be any lotions, oils, powders, or colognes on skin where monitor is to be applied. Wipe skin clean with enclosed Saline wipe. Dry skin completely.  Peel paper labeled #1 off the back of the Monitor strip exposing the adhesive. Place the monitor on the chest in the vertical or horizontal position shown in the instruction booklet. One arrow on the monitor strip must be pointing upward. Carefully remove paper labeled #2, attaching remainder of strip to your skin. Try not to create any folds or wrinkles in the strip as you apply it.  Firmly press and release the circle in the center of the monitor battery. You will hear a small beep. This is turning the monitor battery on. The heart emblem on the monitor battery will light up every 5 seconds if the monitor battery in turned on and connected to the patient securely. Do not push and hold the circle down  as this turns the monitor battery off. The cell phone will locate the monitor battery. A screen will appear on the cell phone checking the connection of your monitor strip. This may read poor connection initially but change to good connection within the next minute. Once your monitor accepts the connection you will hear a series of 3 beeps followed by a climbing crescendo of beeps. A screen will appear  on the cell phone showing the two monitor strip placement options. Touch the picture that demonstrates where you applied the monitor strip.  Your monitor strip and battery are waterproof. You are able to shower, bathe, or swim with the monitor on. They just ask you do not submerge deeper than 3 feet underwater. We recommend removing the monitor if you are swimming in a lake, river, or ocean.  Your monitor battery will need to be switched to a fully charged monitor battery approximately once a week. The cell phone will alert you of an action which needs to be made.  On the cell phone, tap for details to reveal connection status, monitor battery status, and cell phone battery status. The green dots indicates your monitor is in good status. A red dot indicates there is something that needs your attention.  To record a symptom, click the circle on the monitor battery. In 30-60 seconds a list of symptoms will appear on the cell phone. Select your symptom and tap save. Your monitor will record a sustained or significant arrhythmia regardless of you clicking the button. Some patients do not feel the heart rhythm irregularities. [employer] will notify us of any serious or critical events.  Refer to instruction booklet for instructions on switching batteries, changing strips, the Do not disturb or Pause features, or any additional questions.  Call [employer] at [phone], to confirm your monitor is transmitting and record your baseline. They will answer any questions you may have regarding the monitor instructions at that time.  Returning the monitor to [employer] all equipment back into blue box. Peel off strip of paper to expose adhesive and close box securely. There is a prepaid UPS shipping label on this box. Drop in a UPS drop box, or at a UPS facility like [employer]. You may also contact [employer] to arrange UPS to pick up monitor package at your home.    Follow-Up: At [facility], you and your health needs are our priority.  As part of our continuing mission to provide you with exceptional heart care, we have created designated Provider Care Teams.  These Care Teams include your primary Cardiologist (physician) and Advanced Practice Providers (APPs -  Physician Assistants and Nurse Practitioners) who all work together to provide you with the care you need, when you need it.  We recommend signing up for the patient portal called "MyChart".  Sign up information is provided on this After Visit Summary.  MyChart is used to connect with patients for Virtual Visits (Telemedicine).  Patients are able to view lab/test results, encounter notes, upcoming appointments, etc.  Non-urgent messages can be sent to your provider as well.   To learn more about what you can do with MyChart, go to [email or web address].    Your next appointment:   3 month(s)  The format for your next appointment:   In Person  Provider:   Jenkins Rouge, MD    Important Information About [place]

## 2021-07-16 ENCOUNTER — Encounter: Payer: Self-pay | Admitting: Internal Medicine

## 2021-07-17 MED ORDER — PREDNISONE 20 MG PO TABS: 40.0000 mg | ORAL_TABLET | Freq: Every day | ORAL | 0 refills | Status: DC

## 2021-07-17 NOTE — Telephone Encounter (Signed)
Prednisone sent to pharmacy. Recommend if he is wheezing to take albuterol nebs more frequently to help with shortness of breath. If no improvement needs appointment

## 2021-09-15 ENCOUNTER — Ambulatory Visit: Payer: 59 | Admitting: Cardiovascular Disease

## 2021-10-14 ENCOUNTER — Other Ambulatory Visit: Payer: Self-pay | Admitting: Internal Medicine

## 2021-12-08 ENCOUNTER — Other Ambulatory Visit: Payer: Self-pay | Admitting: Internal Medicine

## 2021-12-21 ENCOUNTER — Encounter: Payer: Self-pay | Admitting: Internal Medicine

## 2021-12-21 ENCOUNTER — Ambulatory Visit (INDEPENDENT_AMBULATORY_CARE_PROVIDER_SITE_OTHER): Payer: 59 | Admitting: Internal Medicine

## 2021-12-21 VITALS — BP 120/84 | HR 88 | Temp 98.2°F | Ht 68.0 in | Wt 209.4 lb

## 2021-12-21 DIAGNOSIS — D860 Sarcoidosis of lung: Secondary | ICD-10-CM | POA: Diagnosis not present

## 2021-12-21 DIAGNOSIS — K219 Gastro-esophageal reflux disease without esophagitis: Secondary | ICD-10-CM | POA: Diagnosis not present

## 2021-12-21 NOTE — Patient Instructions (Signed)
Please schedule follow up scheduled with myself in 1 year.  If my schedule is not open yet, we will contact you with a reminder closer to that time. Please call [phone] if you haven't heard from [place] a month before.   Before your next visit I would like you to have:  Continue albuterol inhaler as needed. I have ordered the PFTs for you to schedule next available at the front desk - will let you know results on mychart.

## 2021-12-21 NOTE — Progress Notes (Signed)
Mathew Moore    [id number]    1965/10/10  Primary Care Physician:Sasser, Silvestre Moment, MD Date of Appointment: 12/21/2021 Established Patient Visit  Chief complaint:   Chief Complaint  Patient presents with   Follow-up    Wheezing, upper lung nodule questions.Have been using inhaler a lot more in the past 3 months     HPI: Mathew Moore is a 56 y.o. gentleman who presented with Pet-Avid hilar adenopathy. 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  Was started on prednisone over the summer and has had a repeat CT scan which shows reduction in his mediastinal adenopathy. Prednisone made his symptoms worse so he was tapered off this in December 2021.   Interval Updates: Here for one year follow up.  Needs albuterol inhaler about once/day with exertional symptoms.  Has night time dry cough, has some wheezing per wife. No fevers, chills, night sweats, weight loss.   No eye problems or skin changes.   Feels like he has good quality sleep. Wife denies snoring. No nocturnal or witnessed apneas. No excessive daytime sleepiness.   I have reviewed the patient's family social and past medical history and updated as appropriate.   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   Pschogenic nonepileptic, last one 11/28/20   Psychogenic nonepileptic seizure    Sarcoidosis 07/2019    Past Surgical History:  Procedure Laterality Date   BRONCHOSCOPY     CLEFT LIP REPAIR     COLONOSCOPY     HERNIA REPAIR Left    LEFT HEART CATH AND CORONARY ANGIOGRAPHY N/A 08/23/2018   Procedure: LEFT HEART CATH AND CORONARY ANGIOGRAPHY;  Surgeon: Burnell Blanks, MD;  Location: [place] [facility];   Service: Cardiovascular;  Laterality: N/A;   MEDIASTINOSCOPY N/A 07/20/2019   Procedure: MEDIASTINOSCOPY;  Surgeon: Melrose Nakayama, MD;  Location: [place];  Service: Thoracic;  Laterality: N/A;   NASAL SEPTOPLASTY W/ TURBINOPLASTY Bilateral 12/03/2020   Procedure: REVISION OF NASAL SEPTOPLASTY AND INFERIOR TURBINATE REDUCTION;  Surgeon: Jerrell Belfast, MD;  Location: [place];  Service: ENT;  Laterality: Bilateral;   NASAL SEPTUM SURGERY     PLANTAR FASCIA SURGERY Right    SINUS ENDO W/FUSION Bilateral 12/03/2020   Procedure: BILATERAL ENDOSCOPIC SINUS SURGERY WITH NAVIGATION;  Surgeon: Jerrell Belfast, MD;  Location: [place];  Service: ENT;  Laterality: Bilateral;   VIDEO BRONCHOSCOPY WITH ENDOBRONCHIAL ULTRASOUND N/A 07/06/2019   Procedure: VIDEO BRONCHOSCOPY WITH ENDOBRONCHIAL ULTRASOUND;  Surgeon: Marshell Garfinkel, MD;  Location: [place];  Service: Pulmonary;  Laterality: N/A;    Family History  Problem Relation Age of Onset   Breast cancer Mother    Heart disease Father    Lung cancer Father    Heart attack Father     Social History   Occupational History   Not on file  Tobacco Use   Smoking status: Never   Smokeless tobacco: Never  Vaping Use   Vaping Use: Never used  Substance and Sexual Activity   Alcohol use: Never   Drug use: Never   Sexual activity: Yes    Birth control/protection: None     Physical Exam: Blood pressure 120/84, pulse 88, temperature 98.2  F (36.8 C), temperature source Oral, height '5\' 8"'$  (1.727 m), weight 209 lb 6.4 oz (95 kg), SpO2 99 %.  Gen:      Well appearing, no distress Lungs:    ctab no wheezes or crackles, no increased wob CV:        RRR no mrg   Data Reviewed: Imaging: I have personally reviewed the PET scan may 2021 and it shows reduced hilar lymphadenopathy. Cardiac MRI - no evidence of delayed gadolinium enhancement or scarring consistent with cardiac sarcoidosis   PFTs: Obtained 12/23/20 Normal pulmonary function  Labs: Lab  Results  Component Value Date   WBC 5.2 03/04/2021   HGB 12.9 (L) 03/04/2021   HCT 39.4 03/04/2021   MCV 89.7 03/04/2021   PLT 278 03/04/2021   Lab Results  Component Value Date   NA 140 03/04/2021   K 4.0 03/04/2021   CL 107 03/04/2021   CO2 27 03/04/2021   Labs reviewed Feb 2023 - LFTs wnl.   Sinus biopsy shows mild inflammation  Immunization status: Immunization History  Administered Date(s) Administered   Influenza,inj,Quad PF,6+ Mos 11/25/2018, 10/23/2019   Influenza,inj,Quad PF,6-35 Mos 11/08/2020   Moderna Sars-Covid-2 Vaccination 03/28/2019, 04/25/2019   Tdap 11/08/2020   Reviewed notes from cardiology, pcp, neurology, ed  Assessment:  Pulmonary Sarcoidosis - in remission.  GERD - on PPI Psychogenic Non-epileptiform seizures  Plan/Recommendations: Continue albuterol as needed. Will obtain PFTs to monitor dyspnea and pulmonary disease.   Sarcoidosis evaluation: Baseline eye examination to evaluate for ocular sarcoidosis -saw ophthalmology fall 2021.  Baseline serum creatinine for renal sarcoidosis - wnl Baseline serum alkaline phosphatase for hepatic sarcoidosis - wnl Baseline serum calcium - wnl Baseline serum complete blood count - wnl Baseline EKG for cardiac sarcoidosis - reviewed May 2023. No IV conduction delay.   Reference: Diagnosis and Detection of Sarcoidosis: An Official [employer] [place], Iss 8, pp e26-e51, May 17, 2018  Return to Care: Return in about 1 year (around 12/22/2022).   Lenice Llamas, MD Pulmonary and [place]

## 2022-01-04 ENCOUNTER — Other Ambulatory Visit: Payer: Self-pay | Admitting: Internal Medicine

## 2022-02-15 ENCOUNTER — Other Ambulatory Visit: Payer: Self-pay | Admitting: Internal Medicine

## 2022-02-16 ENCOUNTER — Ambulatory Visit: Payer: 59 | Admitting: Primary Care

## 2022-03-10 IMAGING — CT CT HEAD W/O CM
3 series · 16 of 47 positions shown, 19 images · non-contrast
Comparison: None.

CLINICAL DATA: Headache and dizziness

EXAM:
CT HEAD WITHOUT CONTRAST
TECHNIQUE: Contiguous axial images were obtained from the base of the skull
through the vertex without intravenous contrast.

[Series 3: head w o · axial · 0.50mm/px · z∈[+38,+173]mm · 10 of 33 slices shown, 13 images]
[im 3/33  brain]
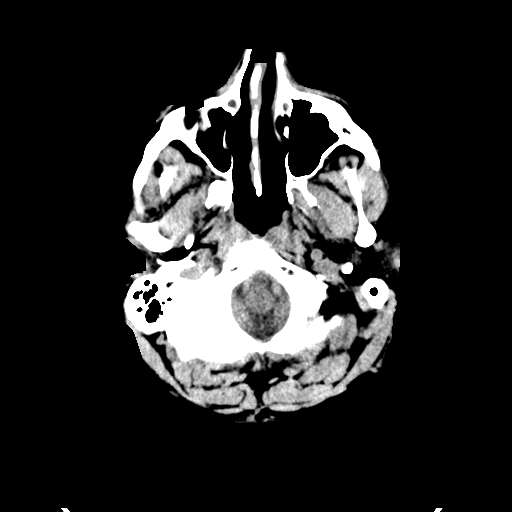
[im 3/33  bone]
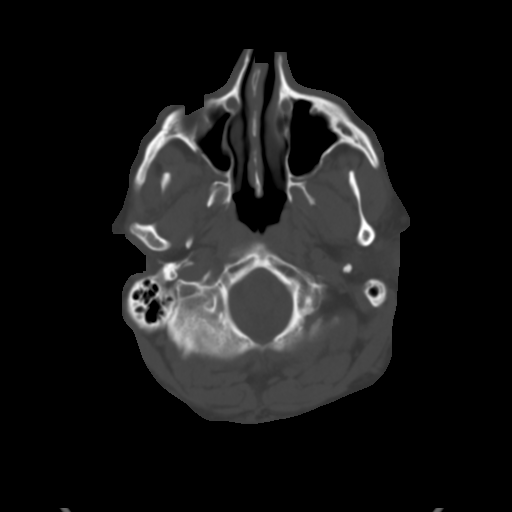
[im 6/33  brain]
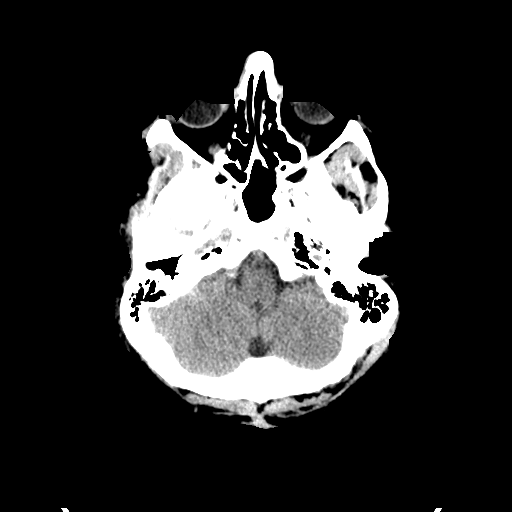
[im 9/33  brain]
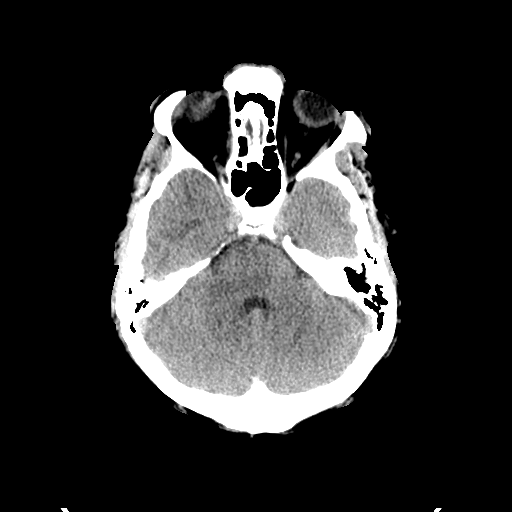
[im 12/33  brain]
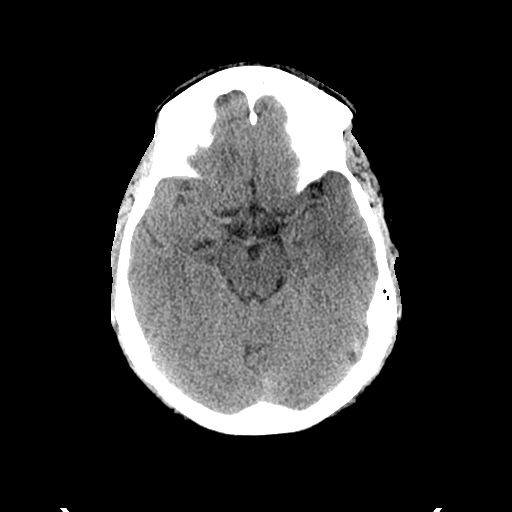
[im 15/33  brain]
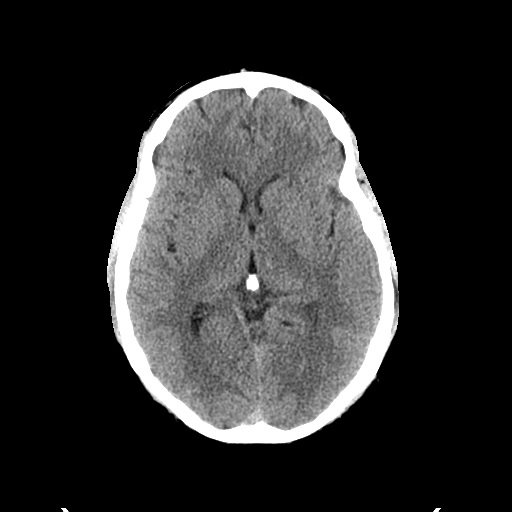
[im 15/33  bone]
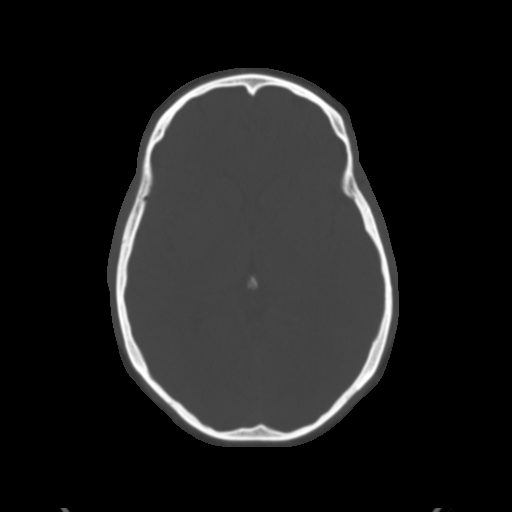
[im 18/33  brain]
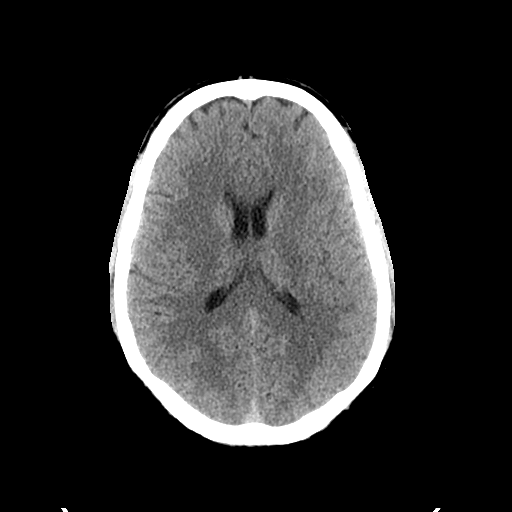
[im 21/33  brain]
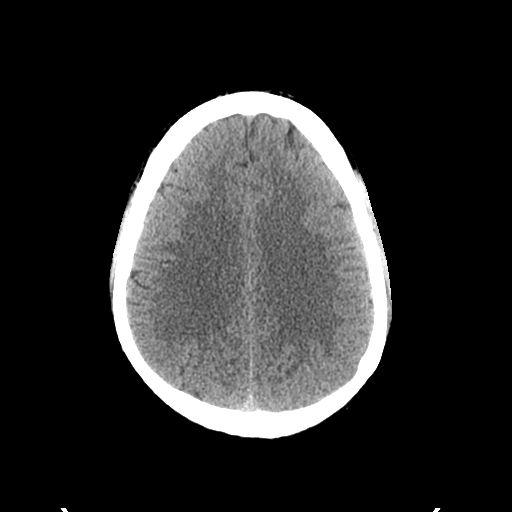
[im 25/33  brain]
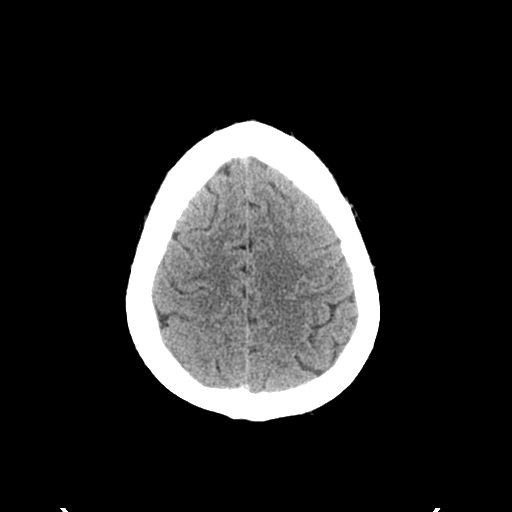
[im 27/33  brain]
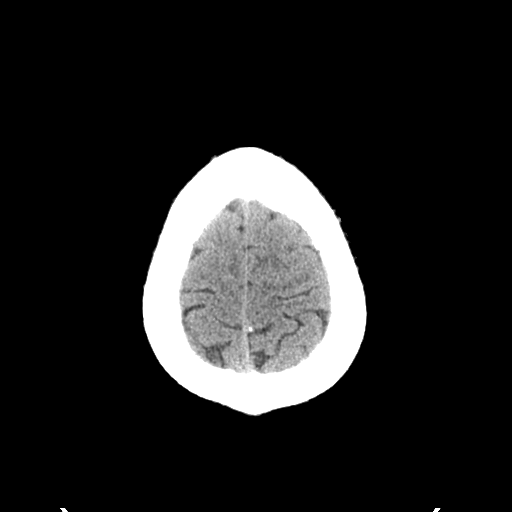
[im 27/33  bone]
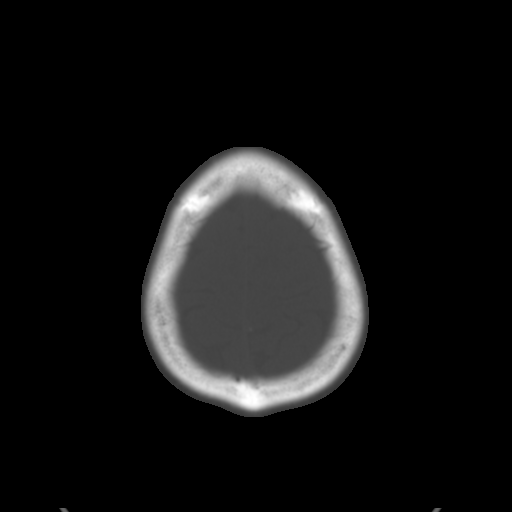
[im 30/33  brain]
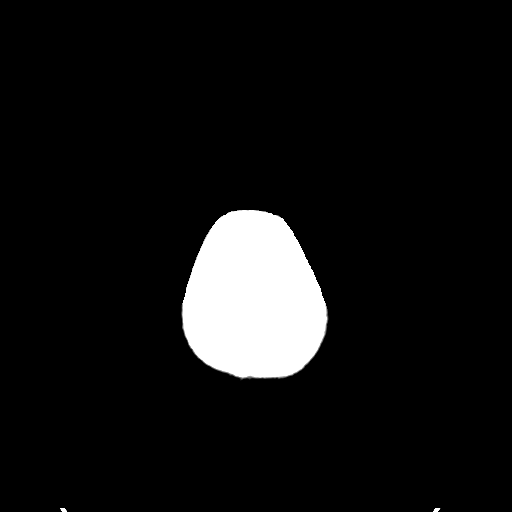

[Series 5: coronal soft · coronal · 0.35mm/px · 3 of 72 slices shown]
[im 24/72  brain]
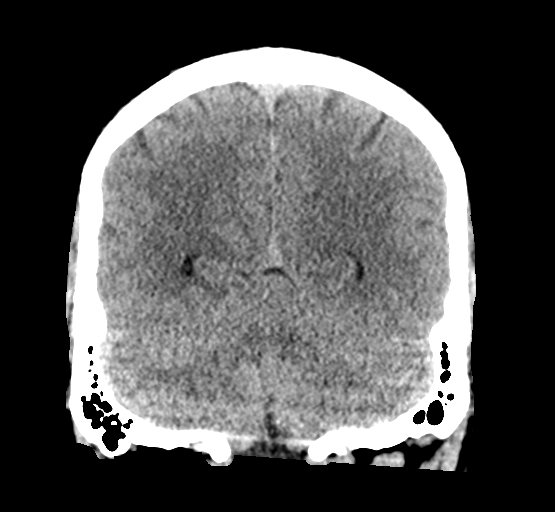
[im 32/72  brain]
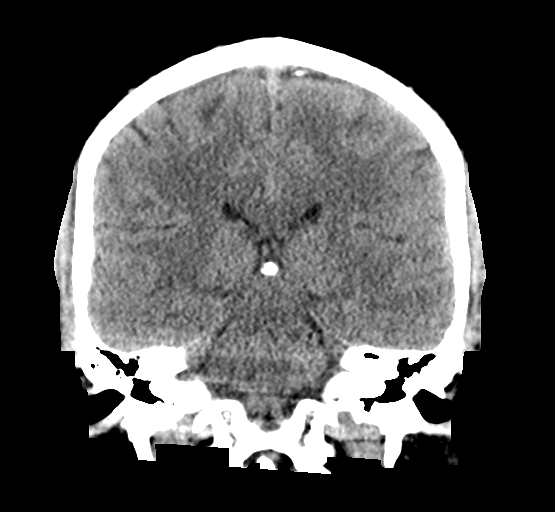
[im 40/72  brain]
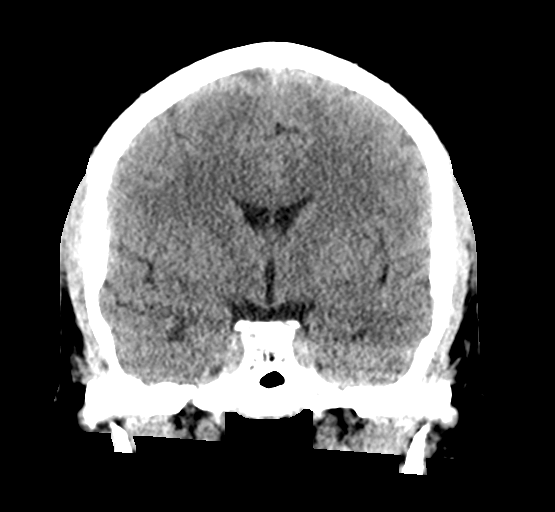

[Series 6: sagittal soft · sagittal · 0.35mm/px · 3 of 62 slices shown]
[im 21/62  brain]
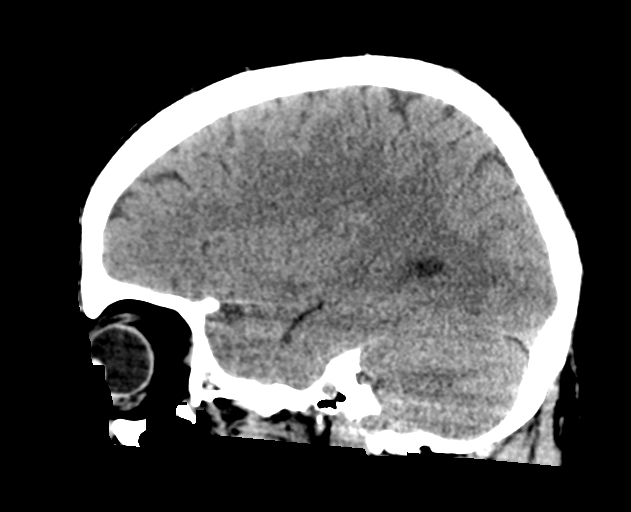
[im 31/62  brain]
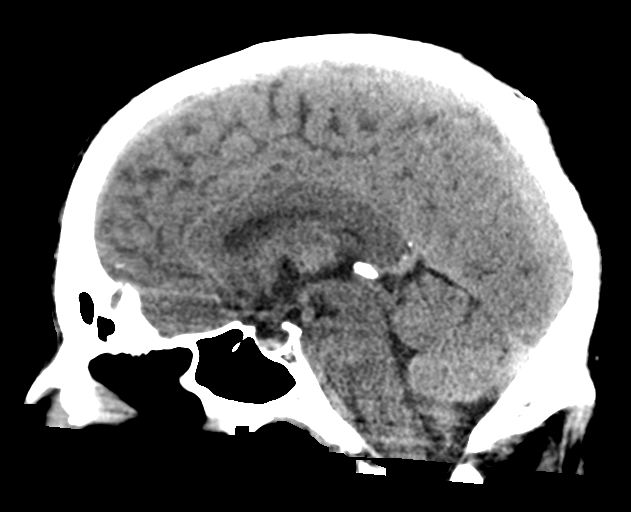
[im 41/62  brain]
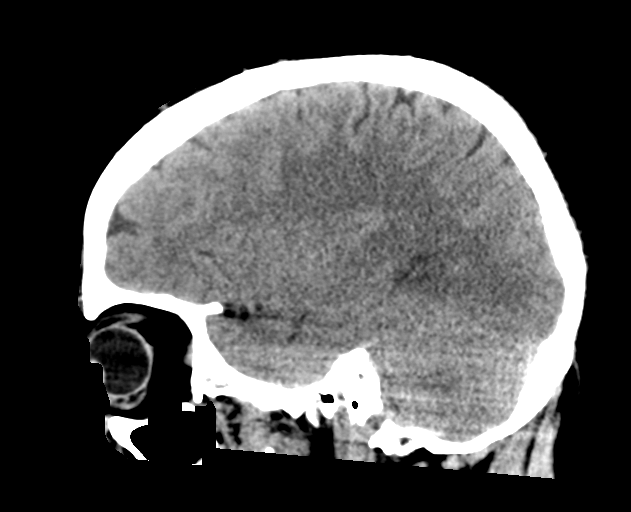

[16 of 47 positions shown; findings below may reference images not displayed]

FINDINGS: Brain: Ventricles and sulci are normal in size and configuration.
There is no intracranial mass, hemorrhage, extra-axial fluid
collection, or midline shift. The brain parenchyma appears
unremarkable. No acute infarct evident.

Vascular: No hyperdense vessel.  No evident vascular calcification.

Skull: Bony calvarium appears intact.

Sinuses/Orbits: There is mild mucosal thickening in the left
maxillary antrum. There is mucosal thickening in several ethmoid air
cells. There is rightward deviation of the nasal septum. Orbits
appear symmetric bilaterally.

Other: Mastoid air cells are clear.
IMPRESSION: Brain parenchyma unremarkable.  No mass or hemorrhage.

There are foci of paranasal sinus disease. There is rightward
deviation of the nasal septum.

## 2022-03-11 ENCOUNTER — Encounter: Payer: Self-pay | Admitting: Cardiovascular Disease

## 2022-03-14 ENCOUNTER — Other Ambulatory Visit: Payer: Self-pay | Admitting: Internal Medicine

## 2022-03-17 ENCOUNTER — Ambulatory Visit: Payer: 59 | Admitting: Primary Care

## 2022-04-14 IMAGING — CR DG CHEST 1V
1 series · 1 of 1 positions shown · non-contrast
Comparison: 06/01/2019

CLINICAL DATA: Bronchoscopy

EXAM:
CHEST  1 VIEW

[AP]
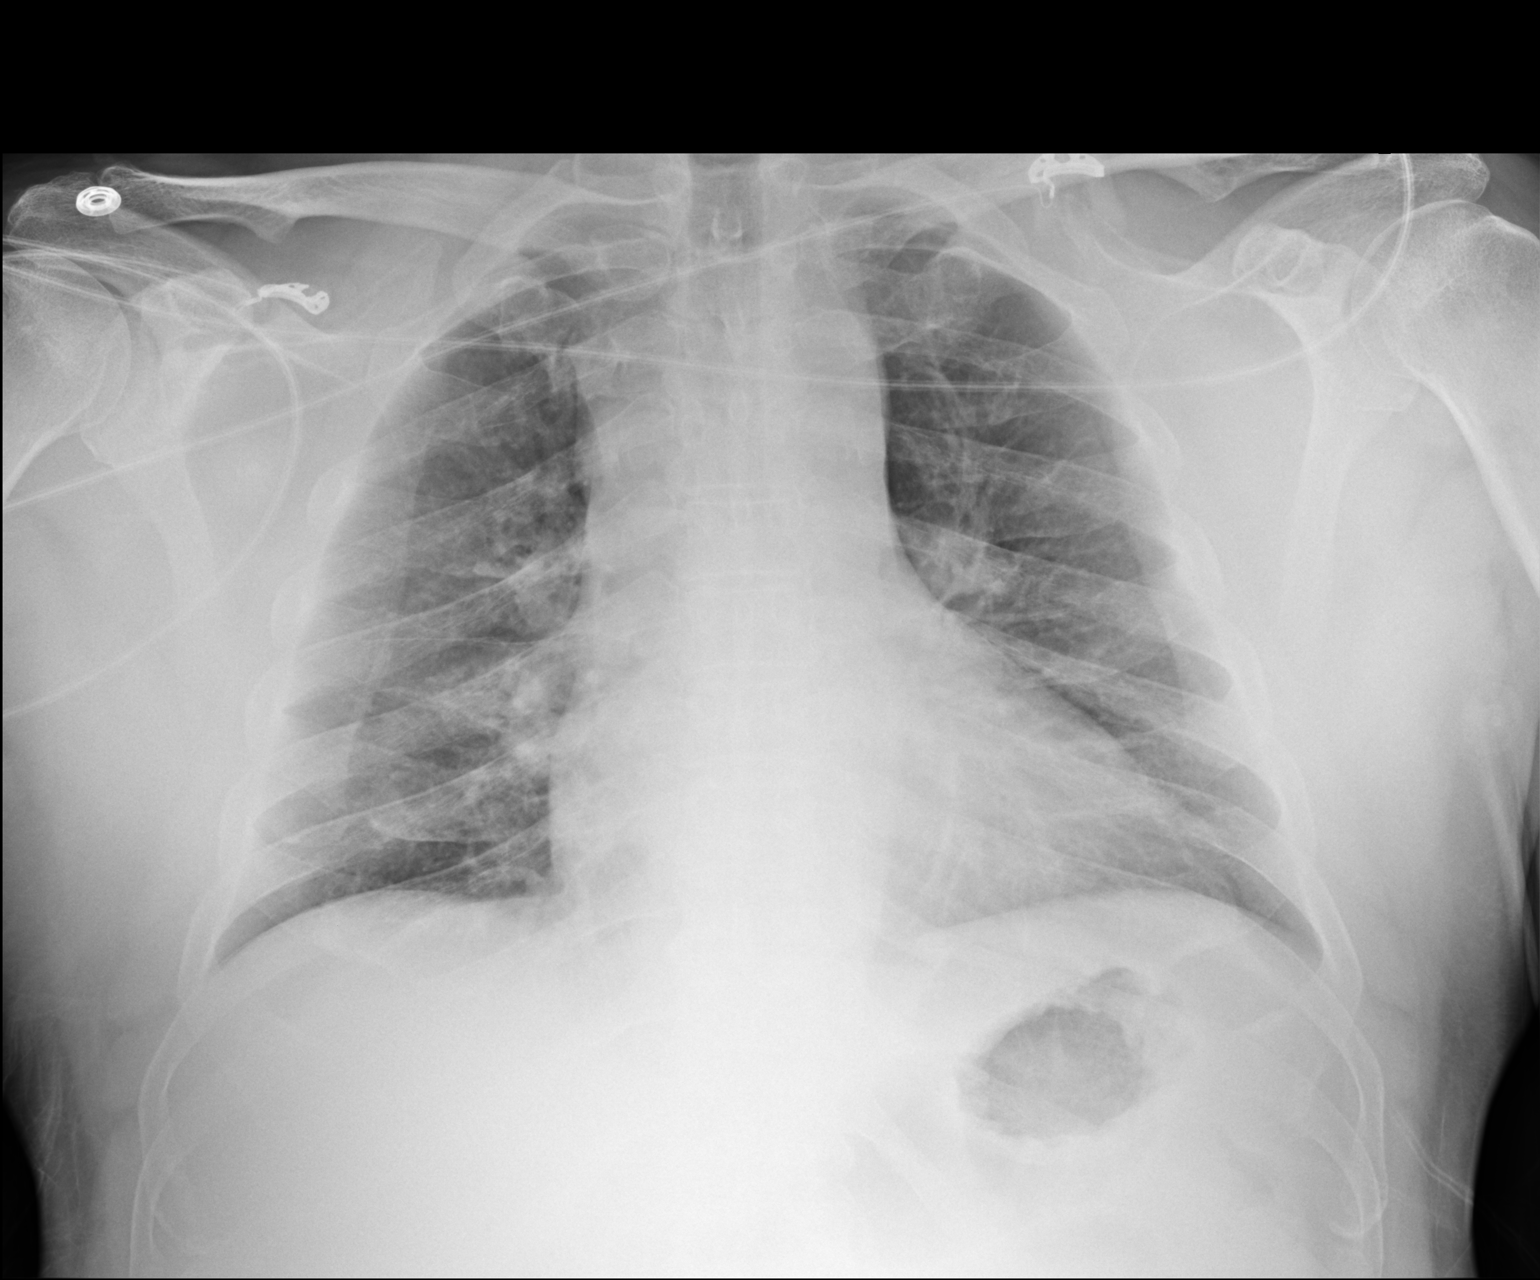

[1 of 1 positions shown; findings below may reference images not displayed]

FINDINGS: Low volume chest. There is no edema, consolidation, effusion, or
pneumothorax. Stable heart size and mediastinal contours-there is
adenopathy by recent CT.
IMPRESSION: No complicating feature after bronchoscopy.

## 2022-05-29 ENCOUNTER — Emergency Department (HOSPITAL_COMMUNITY): Payer: 59

## 2022-05-29 ENCOUNTER — Other Ambulatory Visit: Payer: Self-pay

## 2022-05-29 ENCOUNTER — Encounter (HOSPITAL_COMMUNITY): Payer: Self-pay | Admitting: Surgery

## 2022-05-29 ENCOUNTER — Encounter (HOSPITAL_COMMUNITY)
Admission: EM | Disposition: A | Discharge status: Home / Self Care | Payer: Self-pay | Source: Home / Self Care | Attending: Emergency Medicine

## 2022-05-29 ENCOUNTER — Observation Stay (HOSPITAL_COMMUNITY)
Admission: EM | Admit: 2022-05-29 | Discharge: 2022-05-30 | Disposition: A | Discharge status: Home / Self Care | Payer: 59 | Attending: Surgery | Admitting: Surgery

## 2022-05-29 DIAGNOSIS — S31119A Laceration without foreign body of abdominal wall, unspecified quadrant without penetration into peritoneal cavity, initial encounter: Secondary | ICD-10-CM

## 2022-05-29 DIAGNOSIS — S31110A Laceration without foreign body of abdominal wall, right upper quadrant without penetration into peritoneal cavity, initial encounter: Secondary | ICD-10-CM | POA: Diagnosis present

## 2022-05-29 DIAGNOSIS — Z79899 Other long term (current) drug therapy: Secondary | ICD-10-CM | POA: Diagnosis not present

## 2022-05-29 DIAGNOSIS — W260XXA Contact with knife, initial encounter: Secondary | ICD-10-CM | POA: Insufficient documentation

## 2022-05-29 DIAGNOSIS — I10 Essential (primary) hypertension: Secondary | ICD-10-CM | POA: Insufficient documentation

## 2022-05-29 HISTORY — DX: Essential (primary) hypertension: I10

## 2022-05-29 HISTORY — DX: Unspecified convulsions: R56.9

## 2022-05-29 HISTORY — DX: Gastro-esophageal reflux disease without esophagitis: K21.9

## 2022-05-29 HISTORY — PX: LAPAROSCOPY: SHX197

## 2022-05-29 LAB — I-STAT CHEM 8, ED
BUN: 11 mg/dL (ref 6–20)
Calcium, Ion: 1.19 mmol/L (ref 1.15–1.40)
Chloride: 101 mmol/L (ref 98–111)
Creatinine, Ser: 1.1 mg/dL (ref 0.61–1.24)
Glucose, Bld: 96 mg/dL (ref 70–99)
HCT: 34 % — ABNORMAL LOW (ref 39.0–52.0)
Hemoglobin: 11.6 g/dL — ABNORMAL LOW (ref 13.0–17.0)
Potassium: 4 mmol/L (ref 3.5–5.1)
Sodium: 139 mmol/L (ref 135–145)
TCO2: 26 mmol/L (ref 22–32)

## 2022-05-29 LAB — SAMPLE TO BLOOD BANK

## 2022-05-29 SURGERY — LAPAROSCOPY, DIAGNOSTIC
Anesthesia: General

## 2022-05-29 MED ORDER — FENTANYL CITRATE (PF) 250 MCG/5ML IJ SOLN
INTRAMUSCULAR | Status: AC
  Filled 2022-05-29: qty 5

## 2022-05-29 MED ORDER — CHLORHEXIDINE GLUCONATE 4 % EX SOLN: 1.0000 | Freq: Once | CUTANEOUS | Status: DC

## 2022-05-29 MED ORDER — IOHEXOL 350 MG/ML SOLN
75.0000 mL | Freq: Once | INTRAVENOUS | Status: AC | PRN
  Administered 2022-05-29: 75 mL via INTRAVENOUS

## 2022-05-29 MED ORDER — HYDROMORPHONE HCL 1 MG/ML IJ SOLN
0.5000 mg | INTRAMUSCULAR | Status: DC | PRN
  Administered 2022-05-29 – 2022-05-30 (×3): 0.5 mg via INTRAVENOUS
  Filled 2022-05-29: qty 1
  Filled 2022-05-29 (×2): qty 0.5

## 2022-05-29 MED ORDER — CEFAZOLIN SODIUM-DEXTROSE 2-4 GM/100ML-% IV SOLN
2.0000 g | INTRAVENOUS | Status: AC
  Administered 2022-05-30: 2 g via INTRAVENOUS
  Filled 2022-05-29: qty 100

## 2022-05-29 SURGICAL SUPPLY — 36 items
ADH SKN CLS APL DERMABOND .7 (GAUZE/BANDAGES/DRESSINGS) ×1
APL PRP STRL LF DISP 70% ISPRP (MISCELLANEOUS) ×1
BAG COUNTER SPONGE SURGICOUNT (BAG) ×1 IMPLANT
BAG SPNG CNTER NS LX DISP (BAG) ×1
BLADE CLIPPER SURG (BLADE) IMPLANT
CANISTER SUCT 3000ML PPV (MISCELLANEOUS) IMPLANT
CHLORAPREP W/TINT 26 (MISCELLANEOUS) ×1 IMPLANT
COVER SURGICAL LIGHT HANDLE (MISCELLANEOUS) ×1 IMPLANT
DERMABOND ADVANCED .7 DNX12 (GAUZE/BANDAGES/DRESSINGS) IMPLANT
DRAPE LAPAROSCOPIC ABDOMINAL (DRAPES) IMPLANT
ELECT REM PT RETURN 9FT ADLT (ELECTROSURGICAL) ×1
ELECTRODE REM PT RTRN 9FT ADLT (ELECTROSURGICAL) ×1 IMPLANT
GAUZE SPONGE 4X4 12PLY STRL (GAUZE/BANDAGES/DRESSINGS) IMPLANT
GLOVE BIOGEL PI MICRO STRL 5.5 (GLOVE) IMPLANT
GLOVE SURG POLY MICRO LF SZ5.5 (GLOVE) ×1 IMPLANT
GLOVE SURG UNDER POLY LF SZ6 (GLOVE) ×2 IMPLANT
GOWN STRL REUS W/ TWL LRG LVL3 (GOWN DISPOSABLE) ×2 IMPLANT
GOWN STRL REUS W/TWL LRG LVL3 (GOWN DISPOSABLE) ×2
IRRIG SUCT STRYKERFLOW 2 WTIP (MISCELLANEOUS)
IRRIGATION SUCT STRKRFLW 2 WTP (MISCELLANEOUS) IMPLANT
KIT BASIN OR (CUSTOM PROCEDURE TRAY) ×2 IMPLANT
KIT TURNOVER KIT B (KITS) ×1 IMPLANT
NDL INSUFFLATION 14GA 120MM (NEEDLE) ×1 IMPLANT
NEEDLE INSUFFLATION 14GA 120MM (NEEDLE) ×2 IMPLANT
NS IRRIG 1000ML POUR BTL (IV SOLUTION) ×1 IMPLANT
PAD ARMBOARD 7.5X6 YLW CONV (MISCELLANEOUS) ×2 IMPLANT
SCISSORS LAP 5X35 DISP (ENDOMECHANICALS) IMPLANT
SET TUBE SMOKE EVAC HIGH FLOW (TUBING) ×2 IMPLANT
SLEEVE Z-THREAD 5X100MM (TROCAR) ×1 IMPLANT
SUT MNCRL AB 4-0 PS2 18 (SUTURE) ×1 IMPLANT
TOWEL GREEN STERILE (TOWEL DISPOSABLE) ×2 IMPLANT
TOWEL GREEN STERILE FF (TOWEL DISPOSABLE) ×1 IMPLANT
TRAY LAPAROSCOPIC MC (CUSTOM PROCEDURE TRAY) ×1 IMPLANT
TROCAR BALLN 12MMX100 BLUNT (TROCAR) IMPLANT
TROCAR Z-THREAD OPTICAL 5X100M (TROCAR) ×1 IMPLANT
WARMER LAPAROSCOPE (MISCELLANEOUS) ×1 IMPLANT
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
## 2022-05-29 NOTE — ED Notes (Signed)
FAST exam negative by Dr. Pilar Plate
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
## 2022-05-30 ENCOUNTER — Observation Stay (HOSPITAL_COMMUNITY): Payer: 59

## 2022-05-30 ENCOUNTER — Emergency Department (HOSPITAL_BASED_OUTPATIENT_CLINIC_OR_DEPARTMENT_OTHER): Payer: 59 | Admitting: Certified Registered Nurse Anesthetist

## 2022-05-30 ENCOUNTER — Emergency Department (HOSPITAL_COMMUNITY): Payer: 59 | Admitting: Certified Registered Nurse Anesthetist

## 2022-05-30 ENCOUNTER — Encounter (HOSPITAL_COMMUNITY): Payer: Self-pay | Admitting: Surgery

## 2022-05-30 DIAGNOSIS — S31119A Laceration without foreign body of abdominal wall, unspecified quadrant without penetration into peritoneal cavity, initial encounter: Secondary | ICD-10-CM | POA: Diagnosis present

## 2022-05-30 DIAGNOSIS — I10 Essential (primary) hypertension: Secondary | ICD-10-CM | POA: Diagnosis not present

## 2022-05-30 LAB — COMPREHENSIVE METABOLIC PANEL
ALT: 21 U/L (ref 0–44)
AST: 22 U/L (ref 15–41)
Albumin: 3.8 g/dL (ref 3.5–5.0)
Alkaline Phosphatase: 79 U/L (ref 38–126)
Anion gap: 7 (ref 5–15)
BUN: 10 mg/dL (ref 6–20)
CO2: 27 mmol/L (ref 22–32)
Calcium: 8.9 mg/dL (ref 8.9–10.3)
Chloride: 102 mmol/L (ref 98–111)
Creatinine, Ser: 1.08 mg/dL (ref 0.61–1.24)
GFR, Estimated: >60 mL/min (ref >60)
Glucose, Bld: 104 mg/dL — ABNORMAL HIGH (ref 70–99)
Potassium: 3.8 mmol/L (ref 3.5–5.1)
Sodium: 136 mmol/L (ref 135–145)
Total Bilirubin: 0.6 mg/dL (ref 0.3–1.2)
Total Protein: 6.3 g/dL — ABNORMAL LOW (ref 6.5–8.1)

## 2022-05-30 LAB — CBC
HCT: 33.7 % — ABNORMAL LOW (ref 39.0–52.0)
Hemoglobin: 10.4 g/dL — ABNORMAL LOW (ref 13.0–17.0)
MCH: 23 pg — ABNORMAL LOW (ref 26.0–34.0)
MCHC: 30.9 g/dL (ref 30.0–36.0)
MCV: 74.4 fL — ABNORMAL LOW (ref 80.0–100.0)
Platelets: 317 10*3/uL (ref 150–400)
RBC: 4.53 MIL/uL (ref 4.22–5.81)
RDW: 20.1 % — ABNORMAL HIGH (ref 11.5–15.5)
WBC: 5.1 10*3/uL (ref 4.0–10.5)
nRBC: 0 % (ref 0.0–0.2)

## 2022-05-30 LAB — LACTIC ACID, PLASMA: Lactic Acid, Venous: 1.2 mmol/L (ref 0.5–1.9)

## 2022-05-30 LAB — PROTIME-INR
INR: 1.3 — ABNORMAL HIGH (ref 0.8–1.2)
Prothrombin Time: 15.7 seconds — ABNORMAL HIGH (ref 11.4–15.2)

## 2022-05-30 LAB — ETHANOL: Alcohol, Ethyl (B): <10 mg/dL (ref <10)

## 2022-05-30 LAB — HIV ANTIBODY (ROUTINE TESTING W REFLEX): HIV Screen 4th Generation wRfx: NONREACTIVE

## 2022-05-30 MED ORDER — BUPIVACAINE HCL (PF) 0.25 % IJ SOLN
INTRAMUSCULAR | Status: AC
  Filled 2022-05-30: qty 30

## 2022-05-30 MED ORDER — ENOXAPARIN SODIUM 40 MG/0.4ML IJ SOSY: 40.0000 mg | PREFILLED_SYRINGE | INTRAMUSCULAR | Status: DC

## 2022-05-30 MED ORDER — CEFAZOLIN SODIUM 1 G IJ SOLR
INTRAMUSCULAR | Status: AC
  Filled 2022-05-30: qty 20

## 2022-05-30 MED ORDER — OXYCODONE HCL 5 MG/5ML PO SOLN: 5.0000 mg | Freq: Once | ORAL | Status: DC | PRN

## 2022-05-30 MED ORDER — ACETAMINOPHEN 500 MG PO TABS: 1000.0000 mg | ORAL_TABLET | Freq: Three times a day (TID) | ORAL | 0 refills | Status: DC | PRN

## 2022-05-30 MED ORDER — DOCUSATE SODIUM 100 MG PO CAPS
100.0000 mg | ORAL_CAPSULE | Freq: Two times a day (BID) | ORAL | Status: DC
  Administered 2022-05-30: 100 mg via ORAL
  Filled 2022-05-30 (×2): qty 1

## 2022-05-30 MED ORDER — LIDOCAINE 2% (20 MG/ML) 5 ML SYRINGE
INTRAMUSCULAR | Status: DC | PRN
  Administered 2022-05-30: 60 mg via INTRAVENOUS

## 2022-05-30 MED ORDER — TRAMADOL HCL 50 MG PO TABS
50.0000 mg | ORAL_TABLET | Freq: Four times a day (QID) | ORAL | Status: DC | PRN
  Administered 2022-05-30: 50 mg via ORAL
  Filled 2022-05-30: qty 1

## 2022-05-30 MED ORDER — AMISULPRIDE (ANTIEMETIC) 5 MG/2ML IV SOLN: 10.0000 mg | Freq: Once | INTRAVENOUS | Status: DC | PRN

## 2022-05-30 MED ORDER — PANTOPRAZOLE SODIUM 40 MG PO TBEC
40.0000 mg | DELAYED_RELEASE_TABLET | Freq: Every day | ORAL | Status: DC
  Administered 2022-05-30: 40 mg via ORAL
  Filled 2022-05-30: qty 1

## 2022-05-30 MED ORDER — ROCURONIUM BROMIDE 10 MG/ML (PF) SYRINGE
PREFILLED_SYRINGE | INTRAVENOUS | Status: DC | PRN
  Administered 2022-05-30: 50 mg via INTRAVENOUS

## 2022-05-30 MED ORDER — METHOCARBAMOL 1000 MG/10ML IJ SOLN: 500.0000 mg | Freq: Three times a day (TID) | INTRAVENOUS | Status: DC | PRN

## 2022-05-30 MED ORDER — SUGAMMADEX SODIUM 200 MG/2ML IV SOLN
INTRAVENOUS | Status: DC | PRN
  Administered 2022-05-30 (×2): 200 mg via INTRAVENOUS

## 2022-05-30 MED ORDER — LAMOTRIGINE 25 MG PO TABS
200.0000 mg | ORAL_TABLET | Freq: Every day | ORAL | Status: DC
  Administered 2022-05-30: 200 mg via ORAL
  Filled 2022-05-30: qty 8

## 2022-05-30 MED ORDER — LOSARTAN POTASSIUM 25 MG PO TABS
25.0000 mg | ORAL_TABLET | Freq: Every day | ORAL | Status: DC
  Administered 2022-05-30: 25 mg via ORAL
  Filled 2022-05-30: qty 1

## 2022-05-30 MED ORDER — ONDANSETRON HCL 4 MG/2ML IJ SOLN
INTRAMUSCULAR | Status: DC | PRN
  Administered 2022-05-30: 4 mg via INTRAVENOUS

## 2022-05-30 MED ORDER — PROPOFOL 10 MG/ML IV BOLUS
INTRAVENOUS | Status: DC | PRN
  Administered 2022-05-30: 170 mg via INTRAVENOUS

## 2022-05-30 MED ORDER — FERROUS SULFATE 325 (65 FE) MG PO TABS
325.0000 mg | ORAL_TABLET | Freq: Every day | ORAL | Status: DC
  Administered 2022-05-30: 325 mg via ORAL
  Filled 2022-05-30: qty 1

## 2022-05-30 MED ORDER — ACETAMINOPHEN 500 MG PO TABS
1000.0000 mg | ORAL_TABLET | Freq: Three times a day (TID) | ORAL | Status: DC
  Administered 2022-05-30: 1000 mg via ORAL
  Filled 2022-05-30: qty 2

## 2022-05-30 MED ORDER — SIMETHICONE 80 MG PO CHEW: 40.0000 mg | CHEWABLE_TABLET | Freq: Four times a day (QID) | ORAL | Status: DC | PRN

## 2022-05-30 MED ORDER — TRAMADOL HCL 50 MG PO TABS: 50.0000 mg | ORAL_TABLET | Freq: Four times a day (QID) | ORAL | 0 refills | Status: DC | PRN

## 2022-05-30 MED ORDER — SODIUM CHLORIDE 0.9 % IV SOLN: INTRAVENOUS | Status: DC | PRN

## 2022-05-30 MED ORDER — FENTANYL CITRATE PF 50 MCG/ML IJ SOSY
25.0000 ug | PREFILLED_SYRINGE | INTRAMUSCULAR | Status: DC | PRN
  Administered 2022-05-30: 50 ug via INTRAVENOUS

## 2022-05-30 MED ORDER — ACETAMINOPHEN 10 MG/ML IV SOLN
INTRAVENOUS | Status: DC | PRN
  Administered 2022-05-30: 1000 mg via INTRAVENOUS

## 2022-05-30 MED ORDER — SUCCINYLCHOLINE CHLORIDE 200 MG/10ML IV SOSY
PREFILLED_SYRINGE | INTRAVENOUS | Status: DC | PRN
  Administered 2022-05-30: 140 mg via INTRAVENOUS

## 2022-05-30 MED ORDER — DEXAMETHASONE SODIUM PHOSPHATE 10 MG/ML IJ SOLN
INTRAMUSCULAR | Status: DC | PRN
  Administered 2022-05-30: 10 mg via INTRAVENOUS

## 2022-05-30 MED ORDER — ONDANSETRON 4 MG PO TBDP: 4.0000 mg | ORAL_TABLET | Freq: Four times a day (QID) | ORAL | Status: DC | PRN

## 2022-05-30 MED ORDER — DULOXETINE HCL 60 MG PO CPEP
120.0000 mg | ORAL_CAPSULE | Freq: Every day | ORAL | Status: DC
  Administered 2022-05-30: 120 mg via ORAL
  Filled 2022-05-30: qty 2

## 2022-05-30 MED ORDER — PROPOFOL 10 MG/ML IV BOLUS
INTRAVENOUS | Status: AC
  Filled 2022-05-30: qty 20

## 2022-05-30 MED ORDER — FENTANYL CITRATE (PF) 250 MCG/5ML IJ SOLN
INTRAMUSCULAR | Status: DC | PRN
  Administered 2022-05-30: 100 ug via INTRAVENOUS

## 2022-05-30 MED ORDER — METHOCARBAMOL 500 MG PO TABS: 500.0000 mg | ORAL_TABLET | Freq: Three times a day (TID) | ORAL | Status: DC | PRN

## 2022-05-30 MED ORDER — ACETAMINOPHEN 10 MG/ML IV SOLN
INTRAVENOUS | Status: AC
  Filled 2022-05-30: qty 100

## 2022-05-30 MED ORDER — CLONAZEPAM 0.5 MG PO TABS: 1.0000 mg | ORAL_TABLET | Freq: Three times a day (TID) | ORAL | Status: DC | PRN

## 2022-05-30 MED ORDER — 0.9 % SODIUM CHLORIDE (POUR BTL) OPTIME
TOPICAL | Status: DC | PRN
  Administered 2022-05-30: 1000 mL

## 2022-05-30 MED ORDER — MELATONIN 3 MG PO TABS: 3.0000 mg | ORAL_TABLET | Freq: Every evening | ORAL | Status: DC | PRN

## 2022-05-30 MED ORDER — BUPIVACAINE HCL 0.25 % IJ SOLN
INTRAMUSCULAR | Status: DC | PRN
  Administered 2022-05-30: 10 mL

## 2022-05-30 MED ORDER — FENTANYL CITRATE (PF) 100 MCG/2ML IJ SOLN
INTRAMUSCULAR | Status: AC
  Administered 2022-05-30: 50 ug
  Filled 2022-05-30: qty 2

## 2022-05-30 MED ORDER — OXYCODONE HCL 5 MG PO TABS: 5.0000 mg | ORAL_TABLET | Freq: Once | ORAL | Status: DC | PRN

## 2022-05-30 MED ORDER — ONDANSETRON HCL 4 MG/2ML IJ SOLN: 4.0000 mg | Freq: Four times a day (QID) | INTRAMUSCULAR | Status: DC | PRN

## 2022-05-30 MED ORDER — ATORVASTATIN CALCIUM 40 MG PO TABS
40.0000 mg | ORAL_TABLET | Freq: Every day | ORAL | Status: DC
  Administered 2022-05-30: 40 mg via ORAL
  Filled 2022-05-30: qty 1
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
## 2022-05-30 NOTE — Anesthesia Procedure Notes (Signed)
Procedure Name: Intubation Date/Time: 05/30/2022 12:24 AM  Performed by: Alease Medina, CRNAPre-anesthesia Checklist: Patient identified, Emergency Drugs available, Suction available and Patient being monitored Patient Re-evaluated:Patient Re-evaluated prior to induction Oxygen Delivery Method: Circle system utilized Preoxygenation: Pre-oxygenation with 100% oxygen Induction Type: IV induction, Rapid sequence and Cricoid Pressure applied Laryngoscope Size: Mac and 4 Grade View: Grade II Tube type: Oral Tube size: 7.5 mm Number of attempts: 1 Airway Equipment and Method: Stylet Placement Confirmation: ETT inserted through vocal cords under direct vision, positive ETCO2 and breath sounds checked- equal and bilateral Secured at: 23 cm Tube secured with: Tape Dental Injury: Teeth and Oropharynx as per pre-operative assessment
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
## 2022-05-30 NOTE — Plan of Care (Signed)
  Problem: Education: Goal: Knowledge of General Education information will improve Description: Including pain rating scale, medication(s)/side effects and non-pharmacologic comfort measures 05/30/2022 0938 by Letta Moynahan, RN Outcome: Adequate for Discharge 05/30/2022 [phone] by Letta Moynahan, RN Outcome: Progressing   Problem: Health Behavior/Discharge Planning: Goal: Ability to manage health-related needs will improve 05/30/2022 0938 by Letta Moynahan, RN Outcome: Adequate for Discharge 05/30/2022 [phone] by Letta Moynahan, RN Outcome: Progressing   Problem: Clinical Measurements: Goal: Ability to maintain clinical measurements within normal limits will improve 05/30/2022 0938 by Letta Moynahan, RN Outcome: Adequate for Discharge 05/30/2022 [phone] by Letta Moynahan, RN Outcome: Progressing Goal: Will remain free from infection 05/30/2022 0938 by Letta Moynahan, RN Outcome: Adequate for Discharge 05/30/2022 [phone] by Letta Moynahan, RN Outcome: Progressing Goal: Diagnostic test results will improve 05/30/2022 0938 by Letta Moynahan, RN Outcome: Adequate for Discharge 05/30/2022 [phone] by Letta Moynahan, RN Outcome: Progressing Goal: Respiratory complications will improve 05/30/2022 0938 by Letta Moynahan, RN Outcome: Adequate for Discharge 05/30/2022 [phone] by Letta Moynahan, RN Outcome: Progressing Goal: Cardiovascular complication will be avoided 05/30/2022 2130 by Letta Moynahan, RN Outcome: Adequate for Discharge 05/30/2022 [phone] by Letta Moynahan, RN Outcome: Progressing   Problem: Activity: Goal: Risk for activity intolerance will decrease 05/30/2022 0938 by Letta Moynahan, RN Outcome: Adequate for Discharge 05/30/2022 [phone] by Letta Moynahan, RN Outcome: Progressing   Problem: Nutrition: Goal: Adequate nutrition will be maintained 05/30/2022 0938 by Letta Moynahan, RN Outcome: Adequate for Discharge 05/30/2022 [phone] by Letta Moynahan, RN Outcome: Progressing   Problem:  Coping: Goal: Level of anxiety will decrease 05/30/2022 0938 by Letta Moynahan, RN Outcome: Adequate for Discharge 05/30/2022 [phone] by Letta Moynahan, RN Outcome: Progressing   Problem: Elimination: Goal: Will not experience complications related to bowel motility 05/30/2022 0938 by Letta Moynahan, RN Outcome: Adequate for Discharge 05/30/2022 [phone] by Letta Moynahan, RN Outcome: Progressing Goal: Will not experience complications related to urinary retention 05/30/2022 0938 by Letta Moynahan, RN Outcome: Adequate for Discharge 05/30/2022 [phone] by Letta Moynahan, RN Outcome: Progressing   Problem: Pain Managment: Goal: General experience of comfort will improve 05/30/2022 0938 by Letta Moynahan, RN Outcome: Adequate for Discharge 05/30/2022 [phone] by Letta Moynahan, RN Outcome: Progressing   Problem: Safety: Goal: Ability to remain free from injury will improve 05/30/2022 0938 by Letta Moynahan, RN Outcome: Adequate for Discharge 05/30/2022 [phone] by Letta Moynahan, RN Outcome: Progressing   Problem: Skin Integrity: Goal: Risk for impaired skin integrity will decrease 05/30/2022 0938 by Letta Moynahan, RN Outcome: Adequate for Discharge 05/30/2022 [phone] by Letta Moynahan, RN Outcome: Progressing

## 2022-05-30 NOTE — Plan of Care (Signed)

## 2022-05-30 NOTE — Transfer of Care (Signed)
Immediate Anesthesia Transfer of Care Note  Patient: Mathew Moore  Procedure(s) Performed: LAPAROSCOPY DIAGNOSTIC AND WOUND [facility] OUT  Patient Location: PACU  Anesthesia Type:General  Level of Consciousness: drowsy and patient cooperative  Airway & Oxygen Therapy: Patient Spontanous Breathing  Post-op Assessment: Report given to RN, Post -op Vital signs reviewed and stable, and Patient moving all extremities X 4  Post vital signs: Reviewed and stable  Last Vitals:  Vitals Value Taken Time  BP 137/78 05/30/22 0106  Temp    Pulse 83 05/30/22 0109  Resp 21 05/30/22 0109  SpO2 98 % 05/30/22 0109  Vitals shown include unvalidated device data.  Last Pain:  Vitals:   05/29/22 2323  TempSrc:   PainSc: 2          Complications: No notable events documented.

## 2022-05-31 ENCOUNTER — Encounter: Payer: Self-pay | Admitting: Internal Medicine

## 2022-05-31 NOTE — Anesthesia Postprocedure Evaluation (Signed)
Anesthesia Post Note  Patient: Mathew Moore  Procedure(s) Performed: LAPAROSCOPY DIAGNOSTIC AND WOUND WASH OUT     Patient location during evaluation: PACU Anesthesia Type: General Level of consciousness: awake and alert Pain management: pain level controlled Vital Signs Assessment: post-procedure vital signs reviewed and stable Respiratory status: spontaneous breathing, nonlabored ventilation, respiratory function stable and patient connected to nasal cannula oxygen Cardiovascular status: blood pressure returned to baseline and stable Postop Assessment: no apparent nausea or vomiting Anesthetic complications: no   No notable events documented.  Last Vitals:  Vitals:   05/30/22 0615 05/30/22 0737  BP: 126/76 128/72  Pulse: 84 94  Resp: 15 17  Temp: 36.7 C 36.6 C  SpO2: 94% 99%    Last Pain:  Vitals:   05/30/22 0916  TempSrc:   PainSc: 3                  Earl Lites P Damare Serano

## 2022-06-12 ENCOUNTER — Other Ambulatory Visit: Payer: Self-pay | Admitting: Internal Medicine

## 2022-06-17 ENCOUNTER — Encounter: Payer: Self-pay | Admitting: Internal Medicine

## 2022-06-17 ENCOUNTER — Ambulatory Visit: Payer: 59 | Admitting: Internal Medicine

## 2022-06-17 ENCOUNTER — Ambulatory Visit (INDEPENDENT_AMBULATORY_CARE_PROVIDER_SITE_OTHER): Payer: 59 | Admitting: Internal Medicine

## 2022-06-17 VITALS — BP 126/74 | HR 67 | Temp 97.8°F | Ht 68.0 in | Wt 205.8 lb

## 2022-06-17 DIAGNOSIS — D860 Sarcoidosis of lung: Secondary | ICD-10-CM

## 2022-06-17 LAB — PULMONARY FUNCTION TEST
DL/VA % pred: 157 %
DL/VA: 6.91 ml/min/mmHg/L
DLCO cor % pred: 132 %
DLCO cor: 35.05 ml/min/mmHg
DLCO unc % pred: 113 %
DLCO unc: 30.05 ml/min/mmHg
FEF 25-75 Post: 3.53 L/sec
FEF 25-75 Pre: 2.7 L/sec
FEF2575-%Change-Post: 30 %
FEF2575-%Pred-Post: 116 %
FEF2575-%Pred-Pre: 88 %
FEV1-%Change-Post: 6 %
FEV1-%Pred-Post: 82 %
FEV1-%Pred-Pre: 77 %
FEV1-Post: 2.88 L
FEV1-Pre: 2.7 L
FEV1FVC-%Change-Post: 8 %
FEV1FVC-%Pred-Pre: 106 %
FEV6-%Change-Post: -2 %
FEV6-%Pred-Post: 73 %
FEV6-%Pred-Pre: 76 %
FEV6-Post: 3.22 L
FEV6-Pre: 3.31 L
FEV6FVC-%Pred-Post: 104 %
FEV6FVC-%Pred-Pre: 104 %
FVC-%Change-Post: -2 %
FVC-%Pred-Post: 71 %
FVC-%Pred-Pre: 72 %
FVC-Post: 3.23 L
FVC-Pre: 3.31 L
Post FEV1/FVC ratio: 89 %
Post FEV6/FVC ratio: 100 %
Pre FEV1/FVC ratio: 82 %
Pre FEV6/FVC Ratio: 100 %
RV % pred: 79 %
RV: 1.57 L
TLC % pred: 84 %
TLC: 5.47 L

## 2022-06-17 NOTE — Progress Notes (Signed)
Full PFT performed today. °

## 2022-06-17 NOTE — Patient Instructions (Addendum)
Come back and see me as needed if any issues or concerns about your breathing.  Your lung function is stable.   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 It does not seem to be affecting any of your organs. If you develop any new medical conditions that someone things could be attributed to the sarcoidosis, of course come back and see me.   Continue the albuterol inhaler as needed if it helps your breathing. Your primary care doctor can maintain that for you.

## 2022-06-17 NOTE — Patient Instructions (Signed)
Full PFT performed today. °
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
## 2022-07-11 ENCOUNTER — Other Ambulatory Visit: Payer: Self-pay | Admitting: Internal Medicine

## 2022-09-15 ENCOUNTER — Other Ambulatory Visit: Payer: Self-pay | Admitting: Internal Medicine

## 2022-10-19 ENCOUNTER — Other Ambulatory Visit: Payer: Self-pay | Admitting: Internal Medicine

## 2023-06-01 ENCOUNTER — Other Ambulatory Visit: Payer: Self-pay | Admitting: Internal Medicine

## 2023-06-26 ENCOUNTER — Telehealth: Admitting: Emergency Medicine

## 2023-06-26 DIAGNOSIS — J309 Allergic rhinitis, unspecified: Secondary | ICD-10-CM

## 2023-06-26 MED ORDER — FLUTICASONE PROPIONATE 50 MCG/ACT NA SUSP: 2.0000 | Freq: Every day | NASAL | 0 refills | Status: DC

## 2023-06-26 NOTE — Progress Notes (Signed)
 Unfortunately I am not able to care for you outside of [place] or [place]. I recommend you find a local urgent care for help.   Kind regards,  Bart Born, PhD, FNP-BC [facility]

## 2023-06-29 ENCOUNTER — Other Ambulatory Visit: Payer: Self-pay | Admitting: Internal Medicine

## 2023-09-23 ENCOUNTER — Encounter (HOSPITAL_COMMUNITY): Payer: Self-pay | Admitting: Emergency Medicine

## 2023-09-23 ENCOUNTER — Emergency Department (HOSPITAL_COMMUNITY)

## 2023-09-23 ENCOUNTER — Other Ambulatory Visit: Payer: Self-pay

## 2023-09-23 ENCOUNTER — Emergency Department (HOSPITAL_COMMUNITY)
Admission: EM | Admit: 2023-09-23 | Discharge: 2023-09-23 | Disposition: A | Discharge status: Home / Self Care | Attending: Emergency Medicine | Admitting: Emergency Medicine

## 2023-09-23 DIAGNOSIS — Z23 Encounter for immunization: Secondary | ICD-10-CM | POA: Diagnosis not present

## 2023-09-23 DIAGNOSIS — Y9389 Activity, other specified: Secondary | ICD-10-CM | POA: Diagnosis not present

## 2023-09-23 DIAGNOSIS — S31109A Unspecified open wound of abdominal wall, unspecified quadrant without penetration into peritoneal cavity, initial encounter: Secondary | ICD-10-CM | POA: Diagnosis present

## 2023-09-23 DIAGNOSIS — S31119A Laceration without foreign body of abdominal wall, unspecified quadrant without penetration into peritoneal cavity, initial encounter: Secondary | ICD-10-CM

## 2023-09-23 DIAGNOSIS — W260XXA Contact with knife, initial encounter: Secondary | ICD-10-CM | POA: Diagnosis not present

## 2023-09-23 DIAGNOSIS — S31100A Unspecified open wound of abdominal wall, right upper quadrant without penetration into peritoneal cavity, initial encounter: Secondary | ICD-10-CM | POA: Insufficient documentation

## 2023-09-23 DIAGNOSIS — T1490XA Injury, unspecified, initial encounter: Secondary | ICD-10-CM

## 2023-09-23 LAB — CBC
HCT: 37.7 % — ABNORMAL LOW (ref 39.0–52.0)
Hemoglobin: 11.7 g/dL — ABNORMAL LOW (ref 13.0–17.0)
MCH: 23.5 pg — ABNORMAL LOW (ref 26.0–34.0)
MCHC: 31 g/dL (ref 30.0–36.0)
MCV: 75.7 fL — ABNORMAL LOW (ref 80.0–100.0)
Platelets: 321 K/uL (ref 150–400)
RBC: 4.98 MIL/uL (ref 4.22–5.81)
RDW: 20 % — ABNORMAL HIGH (ref 11.5–15.5)
WBC: 5.5 K/uL (ref 4.0–10.5)
nRBC: 0 % (ref 0.0–0.2)

## 2023-09-23 LAB — COMPREHENSIVE METABOLIC PANEL WITH GFR
ALT: 21 U/L (ref 0–44)
AST: 25 U/L (ref 15–41)
Albumin: 3.7 g/dL (ref 3.5–5.0)
Alkaline Phosphatase: 80 U/L (ref 38–126)
Anion gap: 10 (ref 5–15)
BUN: 17 mg/dL (ref 6–20)
CO2: 25 mmol/L (ref 22–32)
Calcium: 8.8 mg/dL — ABNORMAL LOW (ref 8.9–10.3)
Chloride: 104 mmol/L (ref 98–111)
Creatinine, Ser: 0.97 mg/dL (ref 0.61–1.24)
GFR, Estimated: >60 mL/min (ref >60)
Glucose, Bld: 83 mg/dL (ref 70–99)
Potassium: 4.1 mmol/L (ref 3.5–5.1)
Sodium: 139 mmol/L (ref 135–145)
Total Bilirubin: 0.6 mg/dL (ref 0.0–1.2)
Total Protein: 6 g/dL — ABNORMAL LOW (ref 6.5–8.1)

## 2023-09-23 LAB — I-STAT CHEM 8, ED
BUN: 20 mg/dL (ref 6–20)
Calcium, Ion: 1.06 mmol/L — ABNORMAL LOW (ref 1.15–1.40)
Chloride: 104 mmol/L (ref 98–111)
Creatinine, Ser: 1 mg/dL (ref 0.61–1.24)
Glucose, Bld: 79 mg/dL (ref 70–99)
HCT: 36 % — ABNORMAL LOW (ref 39.0–52.0)
Hemoglobin: 12.2 g/dL — ABNORMAL LOW (ref 13.0–17.0)
Potassium: 4 mmol/L (ref 3.5–5.1)
Sodium: 141 mmol/L (ref 135–145)
TCO2: 27 mmol/L (ref 22–32)

## 2023-09-23 LAB — I-STAT CG4 LACTIC ACID, ED: Lactic Acid, Venous: 0.7 mmol/L (ref 0.5–1.9)

## 2023-09-23 LAB — PROTIME-INR
INR: 1.1 (ref 0.8–1.2)
Prothrombin Time: 14.6 s (ref 11.4–15.2)

## 2023-09-23 LAB — SAMPLE TO BLOOD BANK

## 2023-09-23 LAB — ETHANOL: Alcohol, Ethyl (B): <15 mg/dL (ref <15)

## 2023-09-23 MED ORDER — OXYCODONE-ACETAMINOPHEN 5-325 MG PO TABS
1.0000 | ORAL_TABLET | Freq: Once | ORAL | Status: AC
  Administered 2023-09-23: 1 via ORAL
  Filled 2023-09-23: qty 1

## 2023-09-23 MED ORDER — LIDOCAINE HCL (PF) 1 % IJ SOLN
INTRAMUSCULAR | Status: AC
  Filled 2023-09-23: qty 30

## 2023-09-23 MED ORDER — CEFAZOLIN SODIUM-DEXTROSE 1-4 GM/50ML-% IV SOLN: 1.0000 g | Freq: Once | INTRAVENOUS | Status: DC

## 2023-09-23 MED ORDER — CEFAZOLIN SODIUM-DEXTROSE 2-4 GM/100ML-% IV SOLN
2.0000 g | Freq: Once | INTRAVENOUS | Status: AC
  Administered 2023-09-23: 2 g via INTRAVENOUS

## 2023-09-23 MED ORDER — TETANUS-DIPHTH-ACELL PERTUSSIS 5-2.5-18.5 LF-MCG/0.5 IM SUSY
0.5000 mL | PREFILLED_SYRINGE | Freq: Once | INTRAMUSCULAR | Status: AC
  Administered 2023-09-23: 0.5 mL via INTRAMUSCULAR

## 2023-09-23 MED ORDER — IOHEXOL 350 MG/ML SOLN
75.0000 mL | Freq: Once | INTRAVENOUS | Status: AC | PRN
Start: 2023-09-23 — End: 2023-09-23
  Administered 2023-09-23: 75 mL via INTRAVENOUS

## 2023-09-23 NOTE — ED Notes (Signed)
 Patient transported to CT with Dr. Paola Kung, Primary RN and Bernardino ALMA
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
## 2023-09-23 NOTE — ED Notes (Signed)
 Pt states he takes Lamictal daily, has been compliant with this, but typically still has 2 seizures/week.
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
## 2023-09-23 NOTE — ED Notes (Signed)
 Pt's wife at bedside.
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
## 2023-09-23 NOTE — ED Notes (Signed)
 Pt ambulated about 149ft with Therisa, Charity fundraiser. Walked steady, no dizziness endorsed.
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
## 2023-09-23 NOTE — ED Notes (Signed)
 New 4x4 dressing with Mepilex placed over wound. No active bleeding at this time.
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
## 2023-09-23 NOTE — ED Notes (Signed)
 Patient verbalizes understanding of discharge instructions. Opportunity for questioning and answers were provided. Armband removed by staff, pt discharged from ED. Pt ambulated out to car, assisted into car with his wife.
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
## 2023-09-26 ENCOUNTER — Encounter: Payer: Self-pay | Admitting: Internal Medicine
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
## 2023-11-25 ENCOUNTER — Inpatient Hospital Stay: Attending: Hematology | Admitting: Hematology

## 2023-11-25 ENCOUNTER — Inpatient Hospital Stay

## 2023-11-25 VITALS — BP 133/81 | HR 70 | Temp 97.7°F | Resp 18 | Wt 203.0 lb

## 2023-11-25 DIAGNOSIS — Z79899 Other long term (current) drug therapy: Secondary | ICD-10-CM | POA: Diagnosis not present

## 2023-11-25 DIAGNOSIS — Z801 Family history of malignant neoplasm of trachea, bronchus and lung: Secondary | ICD-10-CM | POA: Diagnosis not present

## 2023-11-25 DIAGNOSIS — R5383 Other fatigue: Secondary | ICD-10-CM | POA: Diagnosis not present

## 2023-11-25 DIAGNOSIS — Z87730 Personal history of (corrected) cleft lip and palate: Secondary | ICD-10-CM | POA: Insufficient documentation

## 2023-11-25 DIAGNOSIS — D509 Iron deficiency anemia, unspecified: Secondary | ICD-10-CM | POA: Diagnosis present

## 2023-11-25 DIAGNOSIS — D500 Iron deficiency anemia secondary to blood loss (chronic): Secondary | ICD-10-CM

## 2023-11-25 DIAGNOSIS — K5900 Constipation, unspecified: Secondary | ICD-10-CM | POA: Insufficient documentation

## 2023-11-25 DIAGNOSIS — F419 Anxiety disorder, unspecified: Secondary | ICD-10-CM | POA: Insufficient documentation

## 2023-11-25 DIAGNOSIS — Z8249 Family history of ischemic heart disease and other diseases of the circulatory system: Secondary | ICD-10-CM | POA: Insufficient documentation

## 2023-11-25 DIAGNOSIS — Z803 Family history of malignant neoplasm of breast: Secondary | ICD-10-CM | POA: Diagnosis not present

## 2023-11-25 DIAGNOSIS — G40909 Epilepsy, unspecified, not intractable, without status epilepticus: Secondary | ICD-10-CM | POA: Insufficient documentation

## 2023-11-25 LAB — RETIC PANEL
Immature Retic Fract: 16.1 % — ABNORMAL HIGH (ref 2.3–15.9)
RBC.: 5.82 MIL/uL — ABNORMAL HIGH (ref 4.22–5.81)
Retic Count, Absolute: 55.3 K/uL (ref 19.0–186.0)
Retic Ct Pct: 1 % (ref 0.4–3.1)
Reticulocyte Hemoglobin: 28.1 pg (ref >27.9)

## 2023-11-25 LAB — CBC WITH DIFFERENTIAL/PLATELET
Abs Immature Granulocytes: 0.06 K/uL (ref 0.00–0.07)
Basophils Absolute: 0.1 K/uL (ref 0.0–0.1)
Basophils Relative: 1 %
Eosinophils Absolute: 0.1 K/uL (ref 0.0–0.5)
Eosinophils Relative: 2 %
HCT: 46.7 % (ref 39.0–52.0)
Hemoglobin: 14.5 g/dL (ref 13.0–17.0)
Immature Granulocytes: 1 %
Lymphocytes Relative: 28 %
Lymphs Abs: 2 K/uL (ref 0.7–4.0)
MCH: 24.7 pg — ABNORMAL LOW (ref 26.0–34.0)
MCHC: 31 g/dL (ref 30.0–36.0)
MCV: 79.4 fL — ABNORMAL LOW (ref 80.0–100.0)
Monocytes Absolute: 0.6 K/uL (ref 0.1–1.0)
Monocytes Relative: 8 %
Neutro Abs: 4.3 K/uL (ref 1.7–7.7)
Neutrophils Relative %: 60 %
Platelets: 388 K/uL (ref 150–400)
RBC: 5.88 MIL/uL — ABNORMAL HIGH (ref 4.22–5.81)
RDW: 20.3 % — ABNORMAL HIGH (ref 11.5–15.5)
WBC: 7 K/uL (ref 4.0–10.5)
nRBC: 0 % (ref 0.0–0.2)

## 2023-11-25 LAB — IRON AND TIBC
Iron: 39 ug/dL — ABNORMAL LOW (ref 45–182)
Saturation Ratios: 9 % — ABNORMAL LOW (ref 17.9–39.5)
TIBC: 451 ug/dL — ABNORMAL HIGH (ref 250–450)
UIBC: 411 ug/dL

## 2023-11-25 LAB — FERRITIN: Ferritin: 16 ng/mL — ABNORMAL LOW (ref 24–336)

## 2023-11-28 ENCOUNTER — Encounter (INDEPENDENT_AMBULATORY_CARE_PROVIDER_SITE_OTHER): Payer: Self-pay | Admitting: *Deleted

## 2023-12-09 ENCOUNTER — Inpatient Hospital Stay: Attending: Oncology | Admitting: Oncology

## 2023-12-09 DIAGNOSIS — Z801 Family history of malignant neoplasm of trachea, bronchus and lung: Secondary | ICD-10-CM | POA: Diagnosis not present

## 2023-12-09 DIAGNOSIS — K5900 Constipation, unspecified: Secondary | ICD-10-CM | POA: Insufficient documentation

## 2023-12-09 DIAGNOSIS — M542 Cervicalgia: Secondary | ICD-10-CM | POA: Diagnosis not present

## 2023-12-09 DIAGNOSIS — Z79899 Other long term (current) drug therapy: Secondary | ICD-10-CM | POA: Insufficient documentation

## 2023-12-09 DIAGNOSIS — D500 Iron deficiency anemia secondary to blood loss (chronic): Secondary | ICD-10-CM

## 2023-12-09 DIAGNOSIS — G40909 Epilepsy, unspecified, not intractable, without status epilepticus: Secondary | ICD-10-CM | POA: Insufficient documentation

## 2023-12-09 DIAGNOSIS — D509 Iron deficiency anemia, unspecified: Secondary | ICD-10-CM | POA: Diagnosis present

## 2023-12-09 DIAGNOSIS — Z803 Family history of malignant neoplasm of breast: Secondary | ICD-10-CM | POA: Diagnosis not present

## 2023-12-09 DIAGNOSIS — F419 Anxiety disorder, unspecified: Secondary | ICD-10-CM | POA: Insufficient documentation

## 2023-12-09 DIAGNOSIS — R5383 Other fatigue: Secondary | ICD-10-CM | POA: Diagnosis not present

## 2023-12-09 DIAGNOSIS — Z604 Social exclusion and rejection: Secondary | ICD-10-CM | POA: Diagnosis not present

## 2023-12-09 DIAGNOSIS — Z59868 Other specified financial insecurity: Secondary | ICD-10-CM | POA: Insufficient documentation

## 2023-12-09 DIAGNOSIS — Z8249 Family history of ischemic heart disease and other diseases of the circulatory system: Secondary | ICD-10-CM | POA: Diagnosis not present

## 2023-12-09 DIAGNOSIS — Z87730 Personal history of (corrected) cleft lip and palate: Secondary | ICD-10-CM | POA: Insufficient documentation

## 2023-12-09 NOTE — Progress Notes (Signed)
 [facility]   [phone] [phone]   Clinic New Consult Note   Patient Care Team: Nena Cyndee DELENA DEVONNA as PCP - General (General Practice) Delford Maude BROCKS, MD as PCP - Cardiology (Cardiology) Wilson, Diane G, RN as Oncology Nurse Navigator (Oncology) Porter Andrez SAUNDERS, PA-C (Inactive) as Physician Assistant (Dermatology) Atilano, Deward ORN, MD (Family Medicine) Pcp, No 12/09/2023  CHIEF COMPLAINTS/PURPOSE OF CONSULTATION:  IDA   I connected with Mathew Moore on 12/09/23 at  1:15 PM EST by telephone visit and verified that I am speaking with the correct person using two identifiers.   I discussed the limitations, risks, security and privacy concerns of performing an evaluation and management service by telemedicine and the availability of in-person appointments. I also discussed with the patient that there may be a patient responsible charge related to this service. The patient expressed understanding and agreed to proceed.   Other persons participating in the visit and their role in the encounter: NP, Patient    Patient's location: Home  Provider's location: Clinic    REFERRING PHYSICIAN: Kelli Egolf   Discussed the use of AI scribe software for clinical note transcription with the patient, who gave verbal consent to proceed.  History of Present Illness Mathew Moore is a 58 year old male who presents with iron deficiency anemia. He was referred by Dr. Rosina Nena for evaluation of mild anemia.  He has experienced low hemoglobin levels during blood donation attempts over the past two years, with values of 12.5 and 13. Iron supplements have been taken for a month due to low ferritin (7) and serum iron levels (26) from a blood test on October 21, 2023. Despite treatment, he experiences persistent fatigue, feeling tired by mid-morning despite adequate sleep.  He has a history of seizure disorder, managed with Lamictal  and  clonazepam , which has led to multiple falls and injuries, including a recent abdominal injury two months ago without significant blood loss. He is medically retired due to this condition.  He has low testosterone levels, occasionally reaching 300, and receives weekly testosterone injections, recently increased to twice a week.  No gastrointestinal bleeding is reported, and bowel movements are normal except for slight constipation due to iron pills. A colonoscopy at age 81 was clear, with a recommendation to return in ten years.  Patient reports today he has no energy and appetite is 60%.  He has chronic 6-10 shoulder and neck pain.  He has trouble lifting his bladder.  Reports he has not been eating very well due to consuming too much fast food with his cousin.  Reports his wife has been working an evening job and they have not been cooking at home as much.  Denies any bright red blood per rectum, melena or hematochezia.  He denies ice pica.  Denies diarrhea or constipation.  Reports he had a colonoscopy and is not due for a few more years.   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   Pschogenic nonepileptic, last one 11/28/20   Psychogenic nonepileptic seizure    Sarcoidosis 07/2019   Seizures (HCC)     SURGICAL HISTORY: Past Surgical History:  Procedure Laterality Date   BRONCHOSCOPY     CLEFT LIP REPAIR  COLONOSCOPY     HERNIA REPAIR Left    INGUINAL HERNIA REPAIR     As an infant   LAPAROSCOPY N/A 05/29/2022   Procedure: LAPAROSCOPY DIAGNOSTIC AND WOUND WASH OUT;  Surgeon: Dasie Leonor CROME, MD;  Location: [facility] OR;  Service: General;  Laterality: N/A;   LEFT HEART CATH AND CORONARY ANGIOGRAPHY N/A 08/23/2018   Procedure: LEFT HEART CATH AND CORONARY ANGIOGRAPHY;  Surgeon:  Verlin Lonni BIRCH, MD;  Location: [facility] INVASIVE CV LAB;  Service: Cardiovascular;  Laterality: N/A;   MEDIASTINOSCOPY N/A 07/20/2019   Procedure: MEDIASTINOSCOPY;  Surgeon: Kerrin Elspeth BROCKS, MD;  Location: [facility] OR;  Service: Thoracic;  Laterality: N/A;   MEDIASTINOSCOPY     NASAL SEPTOPLASTY W/ TURBINOPLASTY Bilateral 12/03/2020   Procedure: REVISION OF NASAL SEPTOPLASTY AND INFERIOR TURBINATE REDUCTION;  Surgeon: Mable Lenis, MD;  Location: [facility] OR;  Service: ENT;  Laterality: Bilateral;   NASAL SEPTUM SURGERY     NASAL SINUS SURGERY     PLANTAR FASCIA SURGERY Right    PLANTAR FASCIA SURGERY     SINUS ENDO W/FUSION Bilateral 12/03/2020   Procedure: BILATERAL ENDOSCOPIC SINUS SURGERY WITH NAVIGATION;  Surgeon: Mable Lenis, MD;  Location: [facility] OR;  Service: ENT;  Laterality: Bilateral;   VIDEO BRONCHOSCOPY WITH ENDOBRONCHIAL ULTRASOUND N/A 07/06/2019   Procedure: VIDEO BRONCHOSCOPY WITH ENDOBRONCHIAL ULTRASOUND;  Surgeon: Theophilus Roosevelt, MD;  Location: [facility] OR;  Service: Pulmonary;  Laterality: N/A;    SOCIAL HISTORY: Social History   Socioeconomic History   Marital status: Married    Spouse name: Scientist, Research (medical) of children: 2   Years of education: Not on file   Highest education level: Not on file  Occupational History   Not on file  Tobacco Use   Smoking status: Never   Smokeless tobacco: Never  Vaping Use   Vaping status: Never Used  Substance and Sexual Activity   Alcohol use: Not Currently   Drug use: Never   Sexual activity: Yes    Birth control/protection: None  Other Topics Concern   Not on file  Social History Narrative   ** Merged History Encounter **       ** Merged History Encounter **       Lives with wife   Social Drivers of Health   Financial Resource Strain: Medium Risk (08/08/2022)   Received from [facility]   Overall Financial Resource Strain (CARDIA)    Difficulty of Paying Living Expenses: Somewhat hard  Food Insecurity: No Food  Insecurity (11/22/2023)   Received from [facility]   Hunger Vital Sign    Within the past 12 months, you worried that your food would run out before you got the money to buy more.: Never true    Within the past 12 months, the food you bought just didn't last and you didn't have money to get more.: Never true  Transportation Needs: No Transportation Needs (11/22/2023)   Received from [facility] - Transportation    Lack of Transportation (Medical): No    Lack of Transportation (Non-Medical): No  Physical Activity: Insufficiently Active (08/08/2022)   Received from [facility]   Exercise Vital Sign    On average, how many days per week do you engage in moderate to strenuous exercise (like a brisk walk)?: 1 day    On average, how many minutes do you engage in exercise at this level?: 10 min  Stress: Stress Concern Present (08/08/2022)   Received from [facility]  [employer] [facility] - Occupational Stress Questionnaire    Feeling of Stress : Very much  Social Connections: Somewhat Isolated (08/08/2022)   Received from [facility]   Social Network    How would you rate your social network (family, work, friends)?: Restricted participation with some degree of social isolation  Intimate Partner Violence: Not At Risk (08/08/2022)   Received from [facility]   HITS    Over the last 12 months how often did your partner physically hurt you?: Never    Over the last 12 months how often did your partner insult you or talk down to you?: Never    Over the last 12 months how often did your partner threaten you with physical harm?: Never    Over the last 12 months how often did your partner scream or curse at you?: Never    FAMILY HISTORY: Family History  Problem Relation Age of Onset   Breast cancer Mother    Heart disease Father    Lung cancer Father    Heart attack Father     ALLERGIES:  has no known allergies.  MEDICATIONS:  Current Outpatient  Medications  Medication Sig Dispense Refill   acetaminophen  (TYLENOL ) 500 MG tablet Take 2 tablets (1,000 mg total) by mouth every 8 (eight) hours as needed for mild pain. 30 tablet 0   acetaminophen  (TYLENOL ) 500 MG tablet Take 1,000-2,000 mg by mouth every 6 (six) hours as needed for mild pain (pain score 1-3) or moderate pain (pain score 4-6).     albuterol  (VENTOLIN  HFA) 108 (90 Base) MCG/ACT inhaler Inhale into the lungs.     Ascorbic Acid (VITAMIN C PO) Take 1 tablet by mouth daily.     atorvastatin  (LIPITOR) 40 MG tablet Take 40 mg by mouth daily.     B-D 3CC LUER-LOK SYR 23GX1 23G X 1 3 ML MISC SMARTSIG:1 Syringe(s) Once a Week     calcium  carbonate (OS-CAL) 1250 (500 Ca) MG chewable tablet Chew by mouth.     cephALEXin  (KEFLEX ) 500 MG capsule Take 500 mg by mouth every 6 (six) hours.     Cholecalciferol (VITAMIN D-3) 25 MCG (1000 UT) CAPS Take 2,000 Units by mouth in the morning and at bedtime.     Cholecalciferol (VITAMIN D3 SUPER STRENGTH) 50 MCG (2000 UT) TABS Take 1 tablet by mouth in the morning.     clonazePAM  (KLONOPIN ) 1 MG tablet Take 0.5-1 mg by mouth 3 (three) times daily as needed for anxiety.     clonazePAM  (KLONOPIN ) 1 MG tablet Take 1 mg by mouth See admin instructions. Take 1 tablet by mouth every morning. May take 0.5 - 1 tab extra if needed.     DULoxetine  (CYMBALTA ) 60 MG capsule Take 120 mg by mouth daily.     DULoxetine  (CYMBALTA ) 60 MG capsule Take 120 mg by mouth every morning.     ferrous sulfate  325 (65 FE) MG tablet Take 325 mg by mouth daily with breakfast.     gabapentin (NEURONTIN) 300 MG capsule Take 1 capsule 3 times a day by oral route for 30 days. (Patient not taking: Reported on 12/09/2023)     ibuprofen  (ADVIL ) 200 MG tablet Take 400-800 mg by mouth every 6 (six) hours as needed for mild pain (pain score 1-3) or moderate pain (pain score 4-6) (hip pain).     lamoTRIgine  (LAMICTAL ) 200 MG tablet Take 200 mg by mouth daily.     lamoTRIgine  (LAMICTAL ) 200  MG tablet  Take 400 mg by mouth at bedtime.     losartan  (COZAAR ) 25 MG tablet Take 25 mg by mouth daily.     losartan  (COZAAR ) 25 MG tablet Take 25 mg by mouth in the morning.     methocarbamol  (ROBAXIN ) 500 MG tablet Take 500 mg by mouth at bedtime as needed.     omeprazole  (PRILOSEC) 40 MG capsule TAKE 1 CAPSULE BY MOUTH IN THE MORNING AND AT BEDTIME 60 capsule 0   QUEtiapine  (SEROQUEL ) 50 MG tablet Take 50 mg by mouth at bedtime.     QUEtiapine  (SEROQUEL ) 50 MG tablet Take 50 mg by mouth at bedtime.     testosterone cypionate (DEPOTESTOSTERONE CYPIONATE) 200 MG/ML injection Inject 200 mg into the muscle. Every 3 weeks     testosterone cypionate (DEPOTESTOSTERONE CYPIONATE) 200 MG/ML injection Inject 200 mg into the muscle once a week.     traMADol  (ULTRAM ) 50 MG tablet Take 1 tablet (50 mg total) by mouth every 6 (six) hours as needed for moderate pain. 10 tablet 0   traZODone  (DESYREL ) 50 MG tablet Take 50 mg by mouth at bedtime as needed for sleep.     No current facility-administered medications for this visit.    REVIEW OF SYSTEMS:   Review of Systems  Constitutional:  Positive for malaise/fatigue and weight loss.  Gastrointestinal:  Negative for abdominal pain, constipation and diarrhea.  Genitourinary:  Positive for frequency.  Neurological:  Positive for tingling and sensory change.     PHYSICAL EXAMINATION: ECOG PERFORMANCE STATUS: 1 - Symptomatic but completely ambulatory  There were no vitals filed for this visit.  There were no vitals filed for this visit.   Physical Exam Neurological:     Mental Status: He is alert and oriented to person, place, and time.      Physical Exam ABDOMEN: Abdomen non-tender.  LABORATORY DATA:  I have reviewed the data as listed    Latest Ref Rng & Units 11/25/2023    9:53 AM 09/23/2023    9:06 PM 09/23/2023    9:03 PM  CBC  WBC 4.0 - 10.5 K/uL 7.0  5.5    Hemoglobin 13.0 - 17.0 g/dL 85.4  88.2  87.7   Hematocrit 39.0 - 52.0 %  46.7  37.7  36.0   Platelets 150 - 400 K/uL 388  321        Latest Ref Rng & Units 09/23/2023    9:06 PM 09/23/2023    9:03 PM 05/29/2022   11:47 PM  CMP  Glucose 70 - 99 mg/dL 83  79  96   BUN 6 - 20 mg/dL 17  20  11    Creatinine 0.61 - 1.24 mg/dL 9.02  8.99  8.89   Sodium 135 - 145 mmol/L 139  141  139   Potassium 3.5 - 5.1 mmol/L 4.1  4.0  4.0   Chloride 98 - 111 mmol/L 104  104  101   CO2 22 - 32 mmol/L 25     Calcium  8.9 - 10.3 mg/dL 8.8     Total Protein 6.5 - 8.1 g/dL 6.0     Total Bilirubin 0.0 - 1.2 mg/dL 0.6     Alkaline Phos 38 - 126 U/L 80     AST 15 - 41 U/L 25     ALT 0 - 44 U/L 21        RADIOGRAPHIC STUDIES: I have personally reviewed the radiological images as listed and agreed with the findings in the report.  No results found.  Assessment & Plan Iron deficiency anemia Mild iron deficiency anemia ongoing for the past two years. Ferritin level is 7 and serum iron level is 26 in 10/2023, both very low. Hemoglobin was slightly low at 12.1 in May 2024 and 11.7 in August 2025. MCV is slightly low, consistent with mild iron deficiency.  -Anemia likely related to blood loss from injuries and blood donation (last one year ago). Low testosterone may also contribute to anemia. No evidence of GI bleeding reported, but GI evaluation is warranted to rule out potential sources of blood loss. His last colonoscopy was 6-7 years ago. Iron levels confirm iron deficiency. Reports he has tried oral iron in the past and states it causes GI upset. He is interested in IV iron.  Will go ahead and get him set up for 2 doses of IV Feraheme. Return to clinic in 10 to 12 weeks for labs and see NP. Follow back up with GI.   Orders Placed This Encounter  Procedures   Iron and TIBC    Standing Status:   Future    Expected Date:   03/02/2024    Expiration Date:   05/31/2024   Ferritin    Standing Status:   Future    Expected Date:   03/02/2024    Expiration Date:   05/31/2024   CBC with  Differential/Platelet    Standing Status:   Future    Expected Date:   03/02/2024    Expiration Date:   05/31/2024    All questions were answered. The patient knows to call the clinic with any problems, questions or concerns. I spent 25 minutes counseling the patient face to face. The total time spent in the appointment was 30 minutes including review of chart and various tests results, discussions about plan of care and coordination of care plan.     Delon FORBES Hope, NP 12/09/2023 2:40 PM

## 2024-01-10 ENCOUNTER — Ambulatory Visit (INDEPENDENT_AMBULATORY_CARE_PROVIDER_SITE_OTHER): Admitting: Gastroenterology

## 2024-01-24 ENCOUNTER — Ambulatory Visit: Attending: Neurosurgery | Admitting: Physical Therapy

## 2024-01-30 ENCOUNTER — Encounter (INDEPENDENT_AMBULATORY_CARE_PROVIDER_SITE_OTHER): Payer: Self-pay | Admitting: Gastroenterology

## 2024-01-30 ENCOUNTER — Ambulatory Visit (INDEPENDENT_AMBULATORY_CARE_PROVIDER_SITE_OTHER): Admitting: Gastroenterology

## 2024-01-30 ENCOUNTER — Encounter: Payer: Self-pay | Admitting: *Deleted

## 2024-01-30 ENCOUNTER — Encounter: Payer: Self-pay | Admitting: Oncology

## 2024-01-30 VITALS — BP 131/73 | HR 81 | Temp 97.3°F | Ht 68.0 in | Wt 209.8 lb

## 2024-01-30 DIAGNOSIS — Z8249 Family history of ischemic heart disease and other diseases of the circulatory system: Secondary | ICD-10-CM | POA: Diagnosis not present

## 2024-01-30 DIAGNOSIS — K5904 Chronic idiopathic constipation: Secondary | ICD-10-CM | POA: Insufficient documentation

## 2024-01-30 DIAGNOSIS — Z6831 Body mass index (BMI) 31.0-31.9, adult: Secondary | ICD-10-CM | POA: Insufficient documentation

## 2024-01-30 DIAGNOSIS — K5900 Constipation, unspecified: Secondary | ICD-10-CM | POA: Diagnosis not present

## 2024-01-30 DIAGNOSIS — D509 Iron deficiency anemia, unspecified: Secondary | ICD-10-CM

## 2024-01-30 DIAGNOSIS — I2510 Atherosclerotic heart disease of native coronary artery without angina pectoris: Secondary | ICD-10-CM | POA: Insufficient documentation

## 2024-01-30 DIAGNOSIS — D500 Iron deficiency anemia secondary to blood loss (chronic): Secondary | ICD-10-CM

## 2024-01-30 NOTE — Patient Instructions (Signed)
 It was very nice to meet you today, as dicussed with will plan for the following :  1) upper endoscopy and colonoscopy
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
## 2024-02-03 ENCOUNTER — Inpatient Hospital Stay: Attending: Oncology

## 2024-02-03 VITALS — BP 155/95 | HR 78 | Temp 96.9°F | Resp 18

## 2024-02-03 DIAGNOSIS — D509 Iron deficiency anemia, unspecified: Secondary | ICD-10-CM | POA: Diagnosis present

## 2024-02-03 DIAGNOSIS — Z79899 Other long term (current) drug therapy: Secondary | ICD-10-CM | POA: Insufficient documentation

## 2024-02-03 DIAGNOSIS — D500 Iron deficiency anemia secondary to blood loss (chronic): Secondary | ICD-10-CM

## 2024-02-03 MED ORDER — SODIUM CHLORIDE 0.9 % IV SOLN
510.0000 mg | Freq: Once | INTRAVENOUS | Status: AC
  Administered 2024-02-03: 510 mg via INTRAVENOUS
  Filled 2024-02-03: qty 510

## 2024-02-03 MED ORDER — SODIUM CHLORIDE 0.9 % IV SOLN: INTRAVENOUS | Status: DC

## 2024-02-03 MED ORDER — CETIRIZINE HCL 10 MG PO TABS
10.0000 mg | ORAL_TABLET | Freq: Once | ORAL | Status: AC
  Administered 2024-02-03: 10 mg via ORAL
  Filled 2024-02-03: qty 1

## 2024-02-03 MED ORDER — ACETAMINOPHEN 325 MG PO TABS
650.0000 mg | ORAL_TABLET | Freq: Once | ORAL | Status: AC
  Administered 2024-02-03: 650 mg via ORAL
  Filled 2024-02-03: qty 2

## 2024-02-03 MED ORDER — METHYLPREDNISOLONE SODIUM SUCC 125 MG IJ SOLR
125.0000 mg | Freq: Once | INTRAMUSCULAR | Status: AC
  Administered 2024-02-03: 125 mg via INTRAVENOUS
  Filled 2024-02-03: qty 2

## 2024-02-03 NOTE — Progress Notes (Signed)
 Patient presents today for Feraheme infusion per providers order.  Vital signs WNL.  Patient has no new complaints at this time.  Peripheral IV started and blood return noted pre and post infusion.  Stable during infusion without adverse affects.  Vital signs stable.  No complaints at this time.  Discharge from clinic ambulatory in stable condition.  Alert and oriented X 3.  Follow up with [facility] as scheduled.

## 2024-02-03 NOTE — Patient Instructions (Signed)

## 2024-02-06 ENCOUNTER — Other Ambulatory Visit: Payer: Self-pay | Admitting: Internal Medicine

## 2024-02-10 ENCOUNTER — Inpatient Hospital Stay

## 2024-02-10 VITALS — BP 141/83 | HR 78 | Temp 97.8°F | Resp 18

## 2024-02-10 DIAGNOSIS — D500 Iron deficiency anemia secondary to blood loss (chronic): Secondary | ICD-10-CM

## 2024-02-10 DIAGNOSIS — D509 Iron deficiency anemia, unspecified: Secondary | ICD-10-CM | POA: Diagnosis not present

## 2024-02-10 MED ORDER — CETIRIZINE HCL 10 MG PO TABS
10.0000 mg | ORAL_TABLET | Freq: Once | ORAL | Status: AC
  Administered 2024-02-10: 10 mg via ORAL
  Filled 2024-02-10: qty 1

## 2024-02-10 MED ORDER — METHYLPREDNISOLONE SODIUM SUCC 125 MG IJ SOLR
125.0000 mg | Freq: Once | INTRAMUSCULAR | Status: AC
  Administered 2024-02-10: 125 mg via INTRAVENOUS
  Filled 2024-02-10: qty 2

## 2024-02-10 MED ORDER — ACETAMINOPHEN 325 MG PO TABS
650.0000 mg | ORAL_TABLET | Freq: Once | ORAL | Status: AC
  Administered 2024-02-10: 650 mg via ORAL
  Filled 2024-02-10: qty 2

## 2024-02-10 MED ORDER — SODIUM CHLORIDE 0.9 % IV SOLN: INTRAVENOUS | Status: DC

## 2024-02-10 MED ORDER — SODIUM CHLORIDE 0.9 % IV SOLN
510.0000 mg | Freq: Once | INTRAVENOUS | Status: AC
  Administered 2024-02-10: 510 mg via INTRAVENOUS
  Filled 2024-02-10: qty 510

## 2024-02-10 NOTE — Patient Instructions (Signed)
 [facility] [place] - A DEPT OF [facility]  Discharge Instructions: Thank you for choosing [facility] to provide your oncology and hematology care.  If you have a lab appointment with the Cancer Center - please note that after April 8th, 2024, all labs will be drawn in the cancer center.  You do not have to check in or register with the main entrance as you have in the past but will complete your check-in in the cancer center.  Wear comfortable clothing and clothing appropriate for easy access to any Portacath or PICC line.   We strive to give you quality time with your provider. You may need to reschedule your appointment if you arrive late (15 or more minutes).  Arriving late affects you and other patients whose appointments are after yours.  Also, if you miss three or more appointments without notifying the office, you may be dismissed from the clinic at the providers discretion.      For prescription refill requests, have your pharmacy contact our office and allow 72 hours for refills to be completed.    Today you received the following IV iron, return as scheduled.   To help prevent nausea and vomiting after your treatment, we encourage you to take your nausea medication as directed.  BELOW ARE SYMPTOMS THAT SHOULD BE REPORTED IMMEDIATELY: *FEVER GREATER THAN 100.4 F (38 C) OR HIGHER *CHILLS OR SWEATING *NAUSEA AND VOMITING THAT IS NOT CONTROLLED WITH YOUR NAUSEA MEDICATION *UNUSUAL SHORTNESS OF BREATH *UNUSUAL BRUISING OR BLEEDING *URINARY PROBLEMS (pain or burning when urinating, or frequent urination) *BOWEL PROBLEMS (unusual diarrhea, constipation, pain near the anus) TENDERNESS IN MOUTH AND THROAT WITH OR WITHOUT PRESENCE OF ULCERS (sore throat, sores in mouth, or a toothache) UNUSUAL RASH, SWELLING OR PAIN  UNUSUAL VAGINAL DISCHARGE OR ITCHING   Items with * indicate a potential emergency and should be followed up as soon as possible or  go to the Emergency Department if any problems should occur.  Please show the CHEMOTHERAPY ALERT CARD or IMMUNOTHERAPY ALERT CARD at check-in to the Emergency Department and triage nurse.  Should you have questions after your visit or need to cancel or reschedule your appointment, please contact [facility] [place] - A DEPT OF [facility] [phone]  and follow the prompts.  Office hours are 8:00 a.m. to 4:30 p.m. Monday - Friday. Please note that voicemails left after 4:00 p.m. may not be returned until the following business day.  We are closed weekends and major holidays. You have access to a nurse at all times for urgent questions. Please call the main number to the clinic [phone] and follow the prompts.  For any non-urgent questions, you may also contact your provider using MyChart. We now offer e-Visits for anyone 31 and older to request care online for non-urgent symptoms. For details visit [email or web address].   Also download the MyChart app! Go to the app store, search MyChart, open the app, select [place], and log in with your MyChart username and password.

## 2024-02-10 NOTE — Progress Notes (Signed)
 Patient tolerated iron infusion with no complaints voiced.  Peripheral IV site clean and dry with good blood return noted before and after infusion.  Band aid applied.  VSS with discharge and left in satisfactory condition with no s/s of distress noted.

## 2024-02-14 ENCOUNTER — Telehealth (INDEPENDENT_AMBULATORY_CARE_PROVIDER_SITE_OTHER): Payer: Self-pay

## 2024-02-14 MED ORDER — PEG 3350-KCL-NA BICARB-NACL 420 G PO SOLR: 4000.0000 mL | Freq: Once | ORAL | 0 refills | Status: AC

## 2024-02-14 NOTE — Telephone Encounter (Signed)
 PA on [facility] for TCS/EGD: This member's plan does not currently require notification or prior-authorization through the [employer] Notification or Prior-Authorization Program.

## 2024-02-14 NOTE — Telephone Encounter (Signed)
 Spoke with patient, scheduled TCS/EGD for 03/05/2024 at 10:30am. Rx sent to pharmacy. Instructions sent to mychart.

## 2024-02-24 ENCOUNTER — Encounter: Payer: Self-pay | Admitting: Oncology

## 2024-03-01 ENCOUNTER — Other Ambulatory Visit: Payer: Self-pay

## 2024-03-01 ENCOUNTER — Encounter (HOSPITAL_COMMUNITY): Payer: Self-pay

## 2024-03-01 ENCOUNTER — Encounter (HOSPITAL_COMMUNITY)
Admission: RE | Admit: 2024-03-01 | Discharge: 2024-03-01 | Disposition: A | Discharge status: Home / Self Care | Source: Ambulatory Visit | Attending: Gastroenterology | Admitting: Gastroenterology

## 2024-03-05 ENCOUNTER — Encounter (HOSPITAL_COMMUNITY): Payer: Self-pay | Admitting: Gastroenterology

## 2024-03-05 ENCOUNTER — Encounter (HOSPITAL_COMMUNITY)
Admission: RE | Disposition: A | Discharge status: Home / Self Care | Payer: Self-pay | Source: Home / Self Care | Attending: Gastroenterology

## 2024-03-05 ENCOUNTER — Ambulatory Visit (HOSPITAL_COMMUNITY): Admitting: Anesthesiology

## 2024-03-05 ENCOUNTER — Ambulatory Visit (HOSPITAL_COMMUNITY)
Admission: RE | Admit: 2024-03-05 | Discharge: 2024-03-05 | Disposition: A | Attending: Gastroenterology | Admitting: Gastroenterology

## 2024-03-05 DIAGNOSIS — K31819 Angiodysplasia of stomach and duodenum without bleeding: Secondary | ICD-10-CM

## 2024-03-05 DIAGNOSIS — K31A19 Gastric intestinal metaplasia without dysplasia, unspecified site: Secondary | ICD-10-CM | POA: Insufficient documentation

## 2024-03-05 DIAGNOSIS — K2950 Unspecified chronic gastritis without bleeding: Secondary | ICD-10-CM | POA: Insufficient documentation

## 2024-03-05 DIAGNOSIS — K3189 Other diseases of stomach and duodenum: Secondary | ICD-10-CM | POA: Insufficient documentation

## 2024-03-05 DIAGNOSIS — K2289 Other specified disease of esophagus: Secondary | ICD-10-CM | POA: Insufficient documentation

## 2024-03-05 DIAGNOSIS — D509 Iron deficiency anemia, unspecified: Secondary | ICD-10-CM | POA: Insufficient documentation

## 2024-03-05 DIAGNOSIS — K640 First degree hemorrhoids: Secondary | ICD-10-CM

## 2024-03-05 DIAGNOSIS — K648 Other hemorrhoids: Secondary | ICD-10-CM | POA: Insufficient documentation

## 2024-03-05 DIAGNOSIS — I10 Essential (primary) hypertension: Secondary | ICD-10-CM | POA: Insufficient documentation

## 2024-03-05 DIAGNOSIS — K2970 Gastritis, unspecified, without bleeding: Secondary | ICD-10-CM

## 2024-03-05 LAB — HM COLONOSCOPY

## 2024-03-05 MED ORDER — FENTANYL CITRATE (PF) 100 MCG/2ML IJ SOLN
INTRAMUSCULAR | Status: DC | PRN
  Administered 2024-03-05 (×2): 50 ug via INTRAVENOUS

## 2024-03-05 MED ORDER — FENTANYL CITRATE (PF) 100 MCG/2ML IJ SOLN
INTRAMUSCULAR | Status: AC
  Filled 2024-03-05: qty 2

## 2024-03-05 MED ORDER — PROPOFOL 500 MG/50ML IV EMUL
INTRAVENOUS | Status: AC
  Filled 2024-03-05: qty 50

## 2024-03-05 MED ORDER — LACTATED RINGERS IV SOLN: INTRAVENOUS | Status: DC | PRN

## 2024-03-05 MED ORDER — STERILE WATER FOR IRRIGATION IR SOLN
Status: DC | PRN
  Administered 2024-03-05 (×2): 60 mL

## 2024-03-05 MED ORDER — PROPOFOL 1000 MG/100ML IV EMUL
INTRAVENOUS | Status: AC
  Filled 2024-03-05: qty 100

## 2024-03-05 MED ORDER — PROPOFOL 500 MG/50ML IV EMUL
INTRAVENOUS | Status: DC | PRN
  Administered 2024-03-05: 200 ug/kg/min via INTRAVENOUS

## 2024-03-05 MED ORDER — PHENYLEPHRINE 80 MCG/ML (10ML) SYRINGE FOR IV PUSH (FOR BLOOD PRESSURE SUPPORT)
PREFILLED_SYRINGE | INTRAVENOUS | Status: AC
  Filled 2024-03-05: qty 30

## 2024-03-05 MED ORDER — PROPOFOL 10 MG/ML IV BOLUS
INTRAVENOUS | Status: DC | PRN
  Administered 2024-03-05: 100 mg via INTRAVENOUS

## 2024-03-05 MED ORDER — LIDOCAINE 2% (20 MG/ML) 5 ML SYRINGE
INTRAMUSCULAR | Status: DC | PRN
  Administered 2024-03-05: 80 mg via INTRAVENOUS

## 2024-03-05 NOTE — Discharge Instructions (Signed)
   Discharge instructions Please read the instructions outlined below and refer to this sheet in the next few weeks. These discharge instructions provide you with general information on caring for yourself after you leave the hospital. Your doctor may also give you specific instructions. While your treatment has been planned according to the most current medical practices available, unavoidable complications occasionally occur. If you have any problems or questions after discharge, please call your doctor. ACTIVITY You may resume your regular activity but move at a slower pace for the next 24 hours.  Take frequent rest periods for the next 24 hours.  Walking will help expel (get rid of) the air and reduce the bloated feeling in your abdomen.  No driving for 24 hours (because of the anesthesia (medicine) used during the test).  You may shower.  Do not sign any important legal documents or operate any machinery for 24 hours (because of the anesthesia used during the test).  NUTRITION Drink plenty of fluids.  You may resume your normal diet.  Begin with a light meal and progress to your normal diet.  Avoid alcoholic beverages for 24 hours or as instructed by your caregiver.  MEDICATIONS You may resume your normal medications unless your caregiver tells you otherwise.  WHAT YOU CAN EXPECT TODAY You may experience abdominal discomfort such as a feeling of fullness or "gas" pains.  FOLLOW-UP Your doctor will discuss the results of your test with you.  SEEK IMMEDIATE MEDICAL ATTENTION IF ANY OF THE FOLLOWING OCCUR: Excessive nausea (feeling sick to your stomach) and/or vomiting.  Severe abdominal pain and distention (swelling).  Trouble swallowing.  Temperature over 101 F (37.8 C).  Rectal bleeding or vomiting of blood.    Avoid using high dose aspirin including Goody/BC powders, NSAIDs such as Aleve, ibuprofen, naproxen, Motrin, Voltaren or Advil (even the topical ones)    I hope you have a  great rest of your week!   Kaceton Vieau Faizan Ayushi Pla , M.D.. Gastroenterology and Hepatology [facility]

## 2024-03-05 NOTE — Op Note (Signed)
 [facility] Patient Name: Mathew Moore Procedure Date: 03/05/2024 9:24 AM MRN: [id number] Date of Birth: 1965/09/29 Attending MD: Deatrice Dine , MD, [id number] CSN: [id number] Age: 59 Admit Type: Outpatient Procedure:                Colonoscopy Indications:              Unexplained iron deficiency anemia Providers:                Deatrice Dine, MD, Rosina Sprague, Daphne Mulch                            Technician, Technician Referring MD:              Medicines:                Monitored Anesthesia Care Complications:            No immediate complications. Estimated Blood Loss:     Estimated blood loss: none. Procedure:                Pre-Anesthesia Assessment:                           - Prior to the procedure, a History and Physical                            was performed, and patient medications and                            allergies were reviewed. The patient's tolerance of                            previous anesthesia was also reviewed. The risks                            and benefits of the procedure and the sedation                            options and risks were discussed with the patient.                            All questions were answered, and informed consent                            was obtained. Prior Anticoagulants: The patient has                            taken no anticoagulant or antiplatelet agents                            except for NSAID medication. ASA Grade Assessment:                            II - A patient with mild systemic disease. After  reviewing the risks and benefits, the patient was                            deemed in satisfactory condition to undergo the                            procedure.                           After obtaining informed consent, the colonoscope                            was passed under direct vision. Throughout the                            procedure, the patient's blood  pressure, pulse, and                            oxygen saturations were monitored continuously. The                            CH-HQ190L ([id number]) Colon was introduced through                            the anus and advanced to the the terminal ileum.                            The colonoscopy was performed without difficulty.                            The patient tolerated the procedure well. The                            quality of the bowel preparation was evaluated                            using the BBPS [facility] Bowel Preparation Scale)                            with scores of: Right Colon = 3, Transverse Colon =                            3 and Left Colon = 3 (entire mucosa seen well with                            no residual staining, small fragments of stool or                            opaque liquid). The total BBPS score equals 9. The                            terminal ileum, ileocecal valve, appendiceal  orifice, and rectum were photographed. Scope In: 10:01:44 AM Scope Out: 10:14:48 AM Scope Withdrawal Time: 0 hours 10 minutes 34 seconds  Total Procedure Duration: 0 hours 13 minutes 4 seconds  Findings:      There is no endoscopic evidence of bleeding, inflammation, mass or       polyps in the entire colon.      The terminal ileum appeared normal.      Non-bleeding internal hemorrhoids were found during retroflexion. The       hemorrhoids were small. Impression:               - The examined portion of the ileum was normal.                           - Non-bleeding internal hemorrhoids.                           - No specimens collected. Moderate Sedation:      Per Anesthesia Care Recommendation:           - Patient has a contact number available for                            emergencies. The signs and symptoms of potential                            delayed complications were discussed with the                            patient. Return to  normal activities tomorrow.                            Written discharge instructions were provided to the                            patient.                           - Resume previous diet.                           - Continue present medications.                           - Repeat colonoscopy in 10 years for screening                            purposes.                           - Return to GI clinic as previously scheduled. Procedure Code(s):        --- Professional ---                           [phone], Colonoscopy, flexible; diagnostic, including                            collection of specimen(s) by brushing or  washing,                            when performed (separate procedure) Diagnosis Code(s):        --- Professional ---                           K64.8, Other hemorrhoids                           D50.9, Iron deficiency anemia, unspecified CPT copyright 2022 American Medical Association. All rights reserved. The codes documented in this report are preliminary and upon coder review may  be revised to meet current compliance requirements. Deatrice Dine, MD Deatrice Dine, MD 03/05/2024 10:25:04 AM This report has been signed electronically. Number of Addenda: 0

## 2024-03-05 NOTE — Transfer of Care (Signed)
 Immediate Anesthesia Transfer of Care Note  Patient: Mathew Moore  Procedure(s) Performed: COLONOSCOPY EGD (ESOPHAGOGASTRODUODENOSCOPY) EGD, WITH ARGON PLASMA COAGULATION  Patient Location: Short Stay  Anesthesia Type:General  Level of Consciousness: awake, alert , oriented, and patient cooperative  Airway & Oxygen Therapy: Patient Spontanous Breathing  Post-op Assessment: Report given to RN, Post -op Vital signs reviewed and stable, and Patient moving all extremities X 4  Post vital signs: Reviewed and stable  Last Vitals:  Vitals Value Taken Time  BP    Temp    Pulse    Resp    SpO2      Last Pain:  Vitals:   03/05/24 0935  TempSrc:   PainSc: 10-Worst pain ever      Patients Stated Pain Goal: 7 (03/05/24 0736)  Complications: No notable events documented.

## 2024-03-07 ENCOUNTER — Encounter (INDEPENDENT_AMBULATORY_CARE_PROVIDER_SITE_OTHER): Payer: Self-pay | Admitting: *Deleted

## 2024-03-07 LAB — SURGICAL PATHOLOGY

## 2024-03-08 ENCOUNTER — Encounter (HOSPITAL_COMMUNITY): Payer: Self-pay | Admitting: Gastroenterology

## 2024-03-16 ENCOUNTER — Inpatient Hospital Stay: Attending: Oncology

## 2024-03-23 ENCOUNTER — Inpatient Hospital Stay: Admitting: Oncology
